# Patient Record
Sex: Male | Born: 1985 | Race: Black or African American | Hispanic: No | Marital: Single | Smoking: Never smoker
Health system: Southern US, Community
[De-identification: ages and names within clinical notes are randomized; demographics above are authoritative.]

## PROBLEM LIST (undated history)

## (undated) DIAGNOSIS — F209 Schizophrenia, unspecified: Secondary | ICD-10-CM

## (undated) DIAGNOSIS — Z21 Asymptomatic human immunodeficiency virus [HIV] infection status: Secondary | ICD-10-CM

## (undated) DIAGNOSIS — J45909 Unspecified asthma, uncomplicated: Secondary | ICD-10-CM

## (undated) DIAGNOSIS — B20 Human immunodeficiency virus [HIV] disease: Secondary | ICD-10-CM

---
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
## 2006-01-26 ENCOUNTER — Ambulatory Visit (HOSPITAL_COMMUNITY): Payer: Self-pay | Admitting: Psychiatry

## 2006-05-03 ENCOUNTER — Ambulatory Visit (HOSPITAL_COMMUNITY): Payer: Self-pay | Admitting: Psychiatry

## 2006-07-25 ENCOUNTER — Ambulatory Visit (HOSPITAL_COMMUNITY): Payer: Self-pay | Admitting: Psychiatry

## 2006-12-04 ENCOUNTER — Ambulatory Visit (HOSPITAL_COMMUNITY): Payer: Self-pay | Admitting: Psychiatry

## 2007-02-05 ENCOUNTER — Ambulatory Visit (HOSPITAL_COMMUNITY): Payer: Self-pay | Admitting: Psychiatry

## 2007-05-07 ENCOUNTER — Ambulatory Visit (HOSPITAL_COMMUNITY): Payer: Self-pay | Admitting: Psychiatry

## 2007-07-12 ENCOUNTER — Ambulatory Visit (HOSPITAL_COMMUNITY): Payer: Self-pay | Admitting: Psychiatry

## 2007-10-12 ENCOUNTER — Ambulatory Visit (HOSPITAL_COMMUNITY): Payer: Self-pay | Admitting: Psychiatry

## 2008-01-11 ENCOUNTER — Ambulatory Visit (HOSPITAL_COMMUNITY): Payer: Self-pay | Admitting: Psychiatry

## 2008-06-09 ENCOUNTER — Ambulatory Visit (HOSPITAL_COMMUNITY): Payer: Self-pay | Admitting: Psychiatry

## 2008-08-15 ENCOUNTER — Ambulatory Visit (HOSPITAL_COMMUNITY): Payer: Self-pay | Admitting: Psychiatry

## 2008-12-03 ENCOUNTER — Ambulatory Visit (HOSPITAL_COMMUNITY): Payer: Self-pay | Admitting: Psychiatry

## 2009-05-01 ENCOUNTER — Ambulatory Visit (HOSPITAL_COMMUNITY): Payer: Self-pay | Admitting: Psychiatry

## 2009-10-21 ENCOUNTER — Ambulatory Visit (HOSPITAL_COMMUNITY): Payer: Self-pay | Admitting: Psychiatry

## 2010-02-04 ENCOUNTER — Ambulatory Visit (HOSPITAL_COMMUNITY): Payer: Self-pay | Admitting: Psychiatry

## 2010-05-11 ENCOUNTER — Ambulatory Visit (HOSPITAL_COMMUNITY): Payer: Self-pay | Admitting: Psychiatry

## 2010-07-30 ENCOUNTER — Ambulatory Visit (HOSPITAL_COMMUNITY): Payer: Self-pay | Admitting: Psychiatry

## 2010-08-10 ENCOUNTER — Ambulatory Visit (HOSPITAL_COMMUNITY): Payer: Self-pay | Admitting: Psychiatry

## 2010-09-10 ENCOUNTER — Ambulatory Visit (HOSPITAL_COMMUNITY): Payer: Self-pay | Admitting: Psychiatry

## 2010-11-12 ENCOUNTER — Ambulatory Visit (HOSPITAL_COMMUNITY): Payer: Self-pay | Admitting: Psychiatry

## 2011-01-14 ENCOUNTER — Encounter (HOSPITAL_COMMUNITY): Payer: BC Managed Care – PPO | Admitting: Physician Assistant

## 2011-01-14 DIAGNOSIS — F259 Schizoaffective disorder, unspecified: Secondary | ICD-10-CM

## 2011-03-08 ENCOUNTER — Encounter (HOSPITAL_COMMUNITY): Payer: BC Managed Care – PPO | Admitting: Physician Assistant

## 2011-03-08 DIAGNOSIS — F259 Schizoaffective disorder, unspecified: Secondary | ICD-10-CM

## 2011-03-25 ENCOUNTER — Encounter (HOSPITAL_COMMUNITY): Payer: BC Managed Care – PPO | Admitting: Physician Assistant

## 2011-03-25 DIAGNOSIS — F259 Schizoaffective disorder, unspecified: Secondary | ICD-10-CM

## 2011-04-22 ENCOUNTER — Encounter (HOSPITAL_COMMUNITY): Payer: BC Managed Care – PPO | Admitting: Physician Assistant

## 2011-04-22 DIAGNOSIS — F259 Schizoaffective disorder, unspecified: Secondary | ICD-10-CM

## 2011-04-22 NOTE — Discharge Summary (Signed)
NAME:  Kenneth Welch, Kenneth Welch                    ACCOUNT NO.:  [id number]   MEDICAL RECORD NO.:  [id number]                   PATIENT TYPE:  INP   LOCATION:  0201                                 FACILITY:  [facility]   PHYSICIAN:  Cindie Crumbly, M.D.               DATE OF BIRTH:  [birth year]   DATE OF ADMISSION:  08/01/2003  DATE OF DISCHARGE:  08/05/2003                                 DISCHARGE SUMMARY   REASON FOR ADMISSION:  This 25 year old African-American male was admitted  with an exacerbation of schizoaffective disorder after being discharged from  the hospital on July 30, 2003.  On the day of admission, he became  agitated, responding to internal stimuli, was increasingly combative, and  was readmitted to the hospital because of dangerousness to self and others.  For further history of present illness, please see the patient's psychiatric  admission assessment.   PHYSICAL EXAMINATION:  Physical examination at the time of admission was  entirely unremarkable.   LABORATORY DATA:  His laboratory examination was performed during his  previous hospitalization and no additional laboratory workup was performed  during this hospitalization.  The patient received no x-rays, no special  procedures, no additional consultations.  He sustained no complications  during the course of this hospitalization.   HOSPITAL COURSE:  The patient was continued on Geodon and Zyprexa.  Abilify  was increased to 20 mg p.o. q.h.s.  Ativan was added for agitation and  anxiety.  He tolerated this medication well without side effects.  At the  time of discharge, he denies any homicidal or suicidal ideation, his affect  and mood have improved, he is actively participating in all aspects of the  therapeutic treatment program, no longer appears to be danger to himself or  others.  His thoughts are less disorganized.  He is no longer showing any  significant delusional symptoms and is no longer responding to  auditory  hallucinations.  He is able to perform all of his activities of daily living  and consequently is felt to have reached his maximum benefits of  hospitalization and is ready for discharge to a less restrictive alternative  setting.   CONDITION ON DISCHARGE:  His condition on discharge is improved.   DIAGNOSES:  .  Diagnoses according to DSM-IV:   AXIS I:  1. Schizophreniform disorder.  2. Rule out schizoaffective disorder.   AXIS II:  1. Rule out learning disorder, not otherwise specified.  2. Rule out personality disorder, not otherwise specified.   AXIS III:  None.   AXIS IV:  Severe.   AXIS V:  20 on admission, 30 on discharge.    FURTHER EVALUATION AND TREATMENT RECOMMENDATIONS:  1. The patient is discharged to home.  2. He is discharged on an unrestricted level of activity and a regular diet.  3. He will follow up with outpatient psychiatrist, Nawar M. Alnaquib, M.D.,     at [facility]  for all further aspects of his     psychiatric care and consequently, I will sign off on the case at this     time.  4. He will follow up with his primary care physician for all further aspects     of his medical care.   DISCHARGE MEDICATIONS:  1. Lorazepam 1 mg p.o. t.i.d. p.r.n. for agitation.  2. Zyprexa 15 mg p.o. at 5 p.m. and q.h.s.  3. Geodon 160 mg p.o. q.h.s.  4. Abilify 20 mg p.o. q.h.s.  5. Cogentin 1 mg p.o. q.h.s.                                               Cindie Crumbly, M.D.    TS/MEDQ  D:  08/05/2003  T:  08/05/2003  Job:  [id number]

## 2011-04-22 NOTE — H&P (Signed)
   NAME:  Kenneth Welch, BISE                    ACCOUNT NO.:  [id number]   MEDICAL RECORD NO.:  [id number]                   PATIENT TYPE:  INP   LOCATION:  0201                                 FACILITY:  [facility]   PHYSICIAN:  Nawar M. Alnaquib, M.D.             DATE OF BIRTH:  Dec 29, 1985   DATE OF ADMISSION:  07/25/2003  DATE OF DISCHARGE:                         PSYCHIATRIC ADMISSION ASSESSMENT   IDENTIFYING INFORMATION:  He is a 25 year old African-American male admitted  because of hearing voices and seeing eyes floating in front of him.  This  happened after he saw someone get killed on a video game that he was  playing.  There was a dead man that scared him.  He got so scared, he got  out of the house and started walking down the street.  At that time, a  friend of his saw him and called 911.  He had just started school and he had  feeling quite well until then, since he was initially discharged from [facility] the first week in August.  He has had a recurrence of  auditory and visual hallucinations.   JUSTIFICATION FOR 24-HOUR CARE:  Hallucinations and agitation.   PAST PSYCHIATRIC HISTORY:  One prior admission to [facility] July to August 2004.  At that time, he was discharged on Abilify,  Geodon and Zyprexa.   SUBSTANCE ABUSE HISTORY:  None.   PAST MEDICAL HISTORY:  None remarkable.   ALLERGIES:  None.   CURRENT MEDICATIONS:  Geodon 120 mg per day, Zyprexa 15-20 mg per day,  Abilify 15 mg at bedtime and Zoloft 100 mg q.d.   MENTAL STATUS EXAM:  The patient reports he is quite a bit depressed.  He  appears with a dull, flat affect.  He talks very much in a low voice.  Speech is, however, normal and intelligible and he appears very worried,  apprehensive and guarded.  He reports auditory and visual hallucinations.  He denies any obsessions, compulsions.  He denies any phobias.  He reports  good insight into his problems but his  judgment is somewhat poor.   STRENGTHS/ASSETS:  Good IQ.  Intelligent voiced and quite mature for his  age.   FAMILY HISTORY:  Not very sure. . .   Dictation ended at this point.                                               Nawar M. Alnaquib, M.D.    NMA/MEDQ  D:  07/26/2003  T:  07/27/2003  Job:  [id number]

## 2011-04-22 NOTE — Discharge Summary (Signed)
NAME:  Kenneth Welch, Kenneth Welch                    ACCOUNT NO.:  [id number]   MEDICAL RECORD NO.:  [id number]                   PATIENT TYPE:  INP   LOCATION:  0201                                 FACILITY:  [facility]   PHYSICIAN:  Cindie Crumbly, M.D.               DATE OF BIRTH:  Aug 11, 1986   DATE OF ADMISSION:  07/01/2003  DATE OF DISCHARGE:  07/15/2003                                 DISCHARGE SUMMARY   REASON FOR ADMISSION:  This 25 year old African-American male was admitted  complaining of a new onset of psychosis consistent with schizophreniform  disorder.  He had become increasingly delusional, assaultive and combative  and required inpatient stabilization.  For further history of present  illness, please see the patient's psychiatry admission assessment.   PHYSICAL EXAMINATION:  At the time of admission was significant only for a  history of pyloric stenosis as an infant.  He had an otherwise unremarkable  physical examination.   LABORATORY EXAMINATION:  The patient underwent a laboratory workup to rule  out any medical problems contributing to his symptomatology.  A urine probe  for gonorrhea and chlamydia were negative.  An RPR was nonreactive.  Urine  drug screen was negative.  A UA was unremarkable.  Basic metabolic panel  showed a CO2 of 16, glucose of 226, BUN of 30, creatinine of 1.9 and sodium  of 147 consistent with the patient having been dehydrated for a period of  several days and then rehydrated in the emergency room.  CBC showed a white  count of 12.8, hemoglobin of 16.6, hematocrit of 48.9 and was otherwise  unremarkable.  The patient received no x-rays, no special procedures, no  additional consultations.  He sustained no complications during the course  of this hospitalization.   HOSPITAL COURSE:  On admission, the patient was psychomotor agitated,  combative, hypervigilant, suspicious, guarded.  His concentration was  decreased.  He was responding to auditory  hallucinations.  Speech displayed  word salad.  He showed various stereotypic posturing.  His thoughts were  disorganized.  He was begun on a trial of Geodon, developed some mild  dystonia, and Cogentin was added to this regimen.  He only showed a partial  response to Geodon and to this Zyprexa was added.  Hallucinations continued  to improve.  His thoughts became more organized but did not completely  resolve his psychotic symptoms and Abilify was added to this regimen.  At  the time of discharge, he denies any homicidal or suicidal ideation.  His  affect and mood remain somewhat flattened and blunted.  He had had some  periods of anxiety to which Zoloft was added and he tolerated this well.  At  the time of discharge, he is actively participating in all aspects of the  therapeutic treatment program.  He remains somewhat isolative, withdrawn and  apathetic.  His hallucinations have considerably decreased and he denies any  hallucinations on the day of  discharge.  His delusional symptoms have  improved.   CONDITION ON DISCHARGE:  Improved.   DISCHARGE DIAGNOSES:   AXIS I:  Schizophreniform disorder.   AXIS II:  None.   AXIS III:  None.   AXIS IV:  Severe.   AXIS V:  Code 20 on admission, code 30 on discharge.   FURTHER EVALUATION AND TREATMENT RECOMMENDATIONS:  1. The patient is discharged to home.  2. He is discharged on an unrestricted level of activity and a regular diet.  3. He will follow up with his outpatient psychiatrist for all further     aspects of his psychiatric care and his primary care physician for all     further aspects of his medical care, and therefore I will sign off on the     case at this time.   DISCHARGE MEDICATIONS:  1. Abilify 15 mg p.o. q.h.s.  2. Zyprexa 15 mg at 5 p.m. and q.h.s.  3. Zoloft 50 mg p.o. daily.  4. Geodon 160 mg p.o. q.h.s.  5. Cogentin 1 mg p.o. q.h.s.                                                 Cindie Crumbly,  M.D.    TS/MEDQ  D:  07/15/2003  T:  07/15/2003  Job:  [id number]

## 2011-04-22 NOTE — H&P (Signed)
NAME:  Kenneth Welch, Kenneth Welch                    ACCOUNT NO.:  [id number]   MEDICAL RECORD NO.:  [id number]                   PATIENT TYPE:  INP   LOCATION:  0201                                 FACILITY:  [facility]   PHYSICIAN:  Cindie Crumbly, M.D.               DATE OF BIRTH:  05-Apr-1986   DATE OF ADMISSION:  08/01/2003  DATE OF DISCHARGE:                         PSYCHIATRIC ADMISSION ASSESSMENT   REASON FOR ADMISSION:  This 25 year old white male was admitted for an  exacerbation of schizoaffective disorder after being discharged from this  facility of July 30, 2003.  On the day of admission he became agitated,  responding to internal stimuli and was increasingly combative.  Mother felt  she could no longer contain him at home and brought him back to the hospital  for stabilization.   HISTORY OF PRESENT ILLNESS:  The patient has had 3 hospitalizations within  the past several weeks for a new onset of psychotic break.  The patient did  well for a period of several days following discharge, then on the day of  admission became agitated, responding to internal stimuli.  He stated that  his thoughts became increasingly disorganized and thought they were the  thoughts of the devil and he became increasingly combative to the point  where he could no longer be contained at home, thus requiring re-  hospitalization.   PAST PSYCHIATRIC HISTORY:  Significant for inpatient hospitalizations at  [facility] from July 27 to July 15, 2003, and  then again from August 21 to July 30, 2003.  For further past history  please see the patient's previous admission and discharge summaries.   DRUG AND ALCOHOL ABUSE HISTORY:  The patient denies any use of alcohol,  tobacco and street drugs.   PAST MEDICAL HISTORY:  He denies any medical or surgical problems.  He has  no known drug allergies or sensitivities.   CURRENT MEDICATIONS:  Geodon 160 mg p.o. q.h.s., Abilify 15 mg  p.o. q.h.s.,  Zyprexa 15 mg at 5 p.m. and 9 p.m., Cogentin 1 mg p.o. q.h.s.   STRENGTHS AND ASSETS:  His family is supportive of him.   FAMILY AND SOCIAL HISTORY:  Unchanged over previous.  Please see his  previous psychiatric admission and discharge summaries.   MENTAL STATUS EXAM:  The patient presents as a well-developed, well-  nourished adolescent male, who is alert, oriented x4, psychomotor agitated,  and whose appearance is compatible with his stated age.  His speech is  coherent with a decreased rate and volume of speech.  He displays occasional  looseness of associations and some disorganization of his speech.  He  displays poor impulse control, decreased concentration.  His thoughts are  disorganized.  He is responding to internal stimuli.  He displays bizarre  delusions.  His affect is flat, mood is blunted, depressed and anxious.  His  thought processes are disorganized.   ADMISSION DIAGNOSES:   AXIS  I:  1. Schizophreniform disorder.  2. Rule out schizoaffective disorder.   AXIS II:  1. Rule out learning disorder not otherwise specified.  2. Rule out personality disorder not otherwise specified.   AXIS III:  None.   AXIS IV:  Severe.   AXIS V:  Code 20.   FURTHER EVALUATION AND TREATMENT RECOMMENDATIONS:  1. Estimated length of stay for the patient on the inpatient unit is 5 to 7     days.  2. Initial discharge plan is to discharge the patient to home.  3. Initial plan of care is to continue the patient on Geodon, Zyprexa and     Cogentin and increase  the patient's Abilify and add Ativan on a p.r.n.     basis for agitation.  Psychotherapy will focus on improving the patient's     impulse control, decreasing cognitive distortions and improving his     reality testing..  A laboratory workup will also be initiated to rule out     any other medical problems contributing to his symptomatology.                                                 Cindie Crumbly,  M.D.    TS/MEDQ  D:  08/02/2003  T:  08/02/2003  Job:  [id number]

## 2011-04-22 NOTE — Discharge Summary (Signed)
NAME:  Kenneth Welch, Kenneth Welch                    ACCOUNT NO.:  [id number]   MEDICAL RECORD NO.:  [id number]                   PATIENT TYPE:  INP   LOCATION:  0201                                 FACILITY:  [facility]   PHYSICIAN:  Cindie Crumbly, M.D.               DATE OF BIRTH:  01/30/1986   DATE OF ADMISSION:  07/25/2003  DATE OF DISCHARGE:  07/30/2003                                 DISCHARGE SUMMARY   REASON FOR ADMISSION:  This 25 year old African-American male was admitted  for an exacerbation of psychotic symptoms.  For further history of present  illness, please see the patient's psychiatric admission assessment.   PHYSICAL EXAMINATION:  His physical examination at the time of admission was  entirely unremarkable.   LABORATORY DATA:  The patient underwent a laboratory workup to rule out any  other medical problems contributing to his symptomatology.  Comprehensive  metabolic showed a SGOT 48, SGPT 141, and was otherwise unremarkable.  CBC  showed an MCV of 92.8, RDW 14.7%, and was otherwise unremarkable.  The  patient received no x-rays, no special procedures, no additional  consultations.  He sustained no complications during the course of this  hospitalization.   HOSPITAL COURSE:  On admission, the patient was psychomotor agitated,  responding to internal stimuli.  He was restarted on his outpatient  medications including Zyprexa, Abilify, Geodon, and Cogentin.  He tolerated  these medications well without side effects.  At the time of discharge, he  denies any homicidal or suicidal ideation.  He reports that his auditory  hallucinations have been resolving.  He denies any plans to harm himself or  others.  He is actively participating in all aspects of the therapeutic  treatment program and is motivated for outpatient therapy.  Consequently, it  is felt he has reached his maximum benefits of hospitalization and is ready  for discharge to a less restrictive alternative  setting.   CONDITION ON DISCHARGE:  His condition on discharge is improved.   DIAGNOSES:  Diagnoses according to DSM-IV:   AXIS I:  1. Schizophreniform disorder.  2. Rule out schizoaffective disorder.   AXIS II:  Rule out personality disorder, not otherwise specified.   AXIS III:  None.   AXIS IV:  Severe.   AXIS V:  20 on admission, 30 on discharge.   FURTHER EVALUATION AND TREATMENT RECOMMENDATIONS:  1. The patient is discharged to home.  2. He is discharged on an unrestricted level of activity and a regular diet.  3. He will follow up with his outpatient psychiatrist, Nawar M. Alnaquib,     M.D., and with Abel Presto, his outpatient psychiatrist at the [facility].  4. He will follow up with his primary care physician for all further aspects     of his medical care and consequently, I will sign off on the case at this     time.   DISCHARGE  MEDICATIONS:  1. Zyprexa 15 mg p.o. q.p.m. and 15 mg q.h.s.  2. Abilify 15 mg p.o. q.h.s.  3. Geodon 160 mg p.o. q.h.s. with food.  4. Cogentin 1 mg p.o. q.h.s.                                                Cindie Crumbly, M.D.    TS/MEDQ  D:  07/30/2003  T:  07/30/2003  Job:  [id number]

## 2011-04-22 NOTE — H&P (Signed)
NAME:  Kenneth Welch, Kenneth Welch                    ACCOUNT NO.:  [id number]   MEDICAL RECORD NO.:  [id number]                   PATIENT TYPE:  INP   LOCATION:  0201                                 FACILITY:  [facility]   PHYSICIAN:  Cindie Crumbly, M.D.               DATE OF BIRTH:  08-Sep-1986   DATE OF ADMISSION:  07/01/2003  DATE OF DISCHARGE:                         PSYCHIATRIC ADMISSION ASSESSMENT   REASON FOR ADMISSION:  This 25 year old African-American male was admitted  complaining of the new onset of a psychotic break.   HISTORY OF PRESENT ILLNESS:  The patient's parents report that he has been  increasingly isolative and withdrawn, responding to internal stimuli over  the past several weeks.  He has been increasingly apathetic and has not been  eating or drinking for the past two to three days.  He has not slept for the  past three days.  He has been increasingly agitated, combative, and  frightened.  He was taken to the emergency department where he underwent a  medical workup.  This was negative, including a CT scan of the brain.  The  patient has no past history of psychiatric illness.  He has no history of  alcohol, tobacco, or street drug use.   PAST MEDICAL HISTORY:  Negative.   PAST SURGICAL HISTORY:  Significant for correction of pyloric stenosis at  age [age] months.  He has no known drug allergies or sensitivities.  He is  taking no medication.  His strengths and assets are that his family is  supportive of him.   FAMILY AND SOCIAL HISTORY:  The patient lives with his mother, father and  sister.  He is a rising Medical sales representative.  There is no family history of  psychiatric illness.   MENTAL STATUS EXAM:  The patient presents as a well-developed, well-  nourished, African-American male who is alert and oriented x4, psychomotor  agitated, combative, and whose appearance is compatible with stated age.  He  displays stereotypic rigid posturing where he will hold himself in a  position with his head and back curled backwards.  He is hypervigilant,  suspicious, guarded, with decreased concentration.  His speech displays word  salad.  His thoughts are profoundly disorganized.  He appears to be somewhat  paranoid and fearing that people are out to harm him.  He is unable to do  similarities, differences, or proverbs.  Memory is not able to be tested at  this time.  He is oriented to person, but does not appear to comprehend his  situation, time, or his physical location.   DIAGNOSES ACCORDING TO DSM-IV:   AXIS I:  1. Schizophreniform disorder.  2. Rule out bipolar disorder.   AXIS II:  1. Rule out learning disorder, not otherwise specified.  2. Rule out personality disorder, not otherwise specified.   AXIS III:  None.   AXIS IV:  Severe.   AXIS V:  20.   FURTHER EVALUATION AND  TREATMENT RECOMMENDATIONS:  The estimated length of  stay for the patient on the inpatient unit is five to seven days.  Initial  discharge plan is to discharge the patient home.  Initial plan of care is to  begin the patient on a trial of Geodon with Zyprexa to attempt to control  his psychotic symptomatology.  I discussed with the patient and his parents  and sister the risks, benefits, side effects, and alternative of using no  medication.  They appeared to understand and agreed to the above trial of  medication.  Psychotherapy will focus on improving the patient's reality  testing, impulse control, and decreasing potential for harm to self and  others.  Laboratory workup will also be initiated to rule out any other  medical problems contributing to his symptomatology.                                                Cindie Crumbly, M.D.    TS/MEDQ  D:  07/01/2003  T:  07/01/2003  Job:  [id number]

## 2011-04-22 NOTE — H&P (Signed)
   NAME:  Kenneth Welch, Kenneth Welch                    ACCOUNT NO.:  [id number]   MEDICAL RECORD NO.:  [id number]                   PATIENT TYPE:  INP   LOCATION:  0201                                 FACILITY:  [facility]   PHYSICIAN:  Nawar M. Alnaquib, M.D.             DATE OF BIRTH:  August 03, 1986   DATE OF ADMISSION:  07/25/2003  DATE OF DISCHARGE:                         PSYCHIATRIC ADMISSION ASSESSMENT   CONTINUATION.   SOCIAL HISTORY:  He lives with his mother, his dad and his sister in  [place].  He attends school, [employer], in 10th grade.   ADMISSION DIAGNOSES:   AXIS I:  Paranoid schizophrenia.   AXIS II:  Deferred.   AXIS III:  None.   AXIS IV:  Psychosocial stressors.   AXIS V:  35 Global Assessment of Functioning.   ESTIMATED LENGTH OF STAY:  One week.   INITIAL DISCHARGE PLAN:  To home.   INITIAL PLAN OF CARE:  Increase Abilify 20 mg at bedtime.  Cogentin added 1  mg p.r.n. and/or EPS.  In addition to that, I will start him on Klonopin 1  mg b.i.d. and Zyprexa 7.5 mg at bedtime (so the dose has been reduced).                                               Nawar M. Alnaquib, M.D.    NMA/MEDQ  D:  07/26/2003  T:  07/27/2003  Job:  [id number]

## 2011-06-02 ENCOUNTER — Encounter (HOSPITAL_COMMUNITY): Payer: BC Managed Care – PPO | Admitting: Physician Assistant

## 2011-06-02 DIAGNOSIS — F259 Schizoaffective disorder, unspecified: Secondary | ICD-10-CM

## 2011-06-30 ENCOUNTER — Encounter (HOSPITAL_COMMUNITY): Payer: BC Managed Care – PPO | Admitting: Physician Assistant

## 2011-06-30 DIAGNOSIS — F29 Unspecified psychosis not due to a substance or known physiological condition: Secondary | ICD-10-CM

## 2011-08-02 ENCOUNTER — Encounter (HOSPITAL_COMMUNITY): Payer: BC Managed Care – PPO | Admitting: Physician Assistant

## 2011-09-07 ENCOUNTER — Encounter (INDEPENDENT_AMBULATORY_CARE_PROVIDER_SITE_OTHER): Payer: BC Managed Care – PPO | Admitting: Physician Assistant

## 2011-09-07 DIAGNOSIS — F29 Unspecified psychosis not due to a substance or known physiological condition: Secondary | ICD-10-CM

## 2011-10-13 ENCOUNTER — Other Ambulatory Visit (HOSPITAL_COMMUNITY): Payer: Self-pay | Admitting: Physician Assistant

## 2011-10-13 DIAGNOSIS — F29 Unspecified psychosis not due to a substance or known physiological condition: Secondary | ICD-10-CM

## 2011-10-13 MED ORDER — ARIPIPRAZOLE 10 MG PO TABS: 10.0000 mg | ORAL_TABLET | Freq: Every day | ORAL | Status: DC

## 2011-10-25 ENCOUNTER — Other Ambulatory Visit (HOSPITAL_COMMUNITY): Payer: Self-pay

## 2011-10-26 ENCOUNTER — Ambulatory Visit (INDEPENDENT_AMBULATORY_CARE_PROVIDER_SITE_OTHER): Payer: BC Managed Care – PPO | Admitting: Physician Assistant

## 2011-10-26 DIAGNOSIS — F259 Schizoaffective disorder, unspecified: Secondary | ICD-10-CM

## 2011-10-26 NOTE — Progress Notes (Signed)
   [facility] Follow-up Outpatient Visit  Kenneth Welch 09-Aug-1986  Date: 10/26/11    Subjective: Pt reports he has been denied three times for disability.  Denies any problems of mood or psychosis, other than disorganized thinking.  There were no vitals filed for this visit.  Mental Status Examination  Appearance: Neat dress and well groomed Alert: Yes Attention: good  Cooperative: Yes Eye Contact: Good Speech: Quiet and clear Psychomotor Activity: Decreased Memory/Concentration: Intact Oriented: person, place, time/date and situation Mood: Depressed Affect: Blunted Thought Processes and Associations: Logical Fund of Knowledge: Fair Thought Content:  Insight: Fair Judgement: Fair  Diagnosis: Schizoaffective disorder  Treatment Plan: Continue meds as prescribed.  Follow up in 2 months.  Suggested seeking legal assistance if pt wants to pursue disability claim.  Alvena Kiernan, PA

## 2012-01-04 ENCOUNTER — Ambulatory Visit (HOSPITAL_COMMUNITY): Payer: BC Managed Care – PPO | Admitting: Physician Assistant

## 2012-01-10 ENCOUNTER — Ambulatory Visit (INDEPENDENT_AMBULATORY_CARE_PROVIDER_SITE_OTHER): Payer: BC Managed Care – PPO | Admitting: Physician Assistant

## 2012-01-10 DIAGNOSIS — F259 Schizoaffective disorder, unspecified: Secondary | ICD-10-CM

## 2012-01-11 NOTE — Progress Notes (Signed)
   [facility] Follow-up Outpatient Visit  Kenneth Welch December 02, 1986  Date: 01/10/12   Subjective: Choya presents today to follow up on his medications for schizoaffective disorder. He happily reports that he is now working as a Engineer, technical sales at [employer], assisting students with introduction to computers. He is working 3 days a week for approximately 4 hours a day. He is also taking classes at [employer] in Proofreader and data and Futures trader. The last 2 classes are online. 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 He reports that his sleep and appetite are okay. He denies any auditory or visual hallucinations. He denies any paranoia, or any suicidal or homicidal  ideation.  There were no vitals filed for this visit.  Mental Status Examination  Appearance: Well groomed and casually dressed Alert: Yes Attention: good  Cooperative: Yes Eye Contact: Fair Speech: Soft and minimal, but clear Psychomotor Activity: Psychomotor Retardation Memory/Concentration: Intact Oriented: person, place, time/date and situation Mood: Dysphoric Affect: Blunt Thought Processes and Associations: Coherent and Goal Directed Fund of Knowledge: Good Thought Content:  Insight: Good Judgement: Good  Diagnosis: Schizoaffective disorder  Treatment Plan: We will continue all of his medications as currently prescribed and followup in one month  Goldie Tregoning, PA

## 2012-02-01 ENCOUNTER — Other Ambulatory Visit (HOSPITAL_COMMUNITY): Payer: Self-pay | Admitting: Physician Assistant

## 2012-02-08 ENCOUNTER — Ambulatory Visit (INDEPENDENT_AMBULATORY_CARE_PROVIDER_SITE_OTHER): Payer: BC Managed Care – PPO | Admitting: Physician Assistant

## 2012-02-08 DIAGNOSIS — F259 Schizoaffective disorder, unspecified: Secondary | ICD-10-CM

## 2012-02-08 DIAGNOSIS — F200 Paranoid schizophrenia: Secondary | ICD-10-CM

## 2012-02-08 NOTE — Progress Notes (Signed)
   [facility] Behavioral [facility] Follow-up Outpatient Visit  Kenneth Welch February 19, 1986  Date: 02/08/12   Subjective: Kenneth Welch presents today to followup on his medications prescribed for schizoaffective disorder. He reports that he continues to do well. 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 He reports that he is not doing anything to fill his spare time that he has during this break. He denies any auditory or visual hallucinations, but he continues to endorse mild paranoia, in that, at times he feels people think he is following them. He denies any suicidal or homicidal ideation. He reports that he is sleeping well, and his appetite is good.  There were no vitals filed for this visit.  Mental Status Examination  Appearance: Well groomed and dressed Alert: Yes Attention: good  Cooperative: Yes Eye Contact: Good Speech: Clear and even Psychomotor Activity: Normal Memory/Concentration: Intact Oriented: person, place, time/date and situation Mood: Anxious Affect: Flat Thought Processes and Associations: Logical Fund of Knowledge: Fair Thought Content: Paranoia Insight: Fair Judgement: Good  Diagnosis: Schizoaffective disorder  Treatment Plan: We will continue his Abilify 10 mg daily, Cogentin 1 mg twice daily, and Zyprexa 5 mg at bedtime. He will followup in 2 months.  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  Besan Ketchem, PA

## 2012-04-10 ENCOUNTER — Ambulatory Visit (INDEPENDENT_AMBULATORY_CARE_PROVIDER_SITE_OTHER): Payer: BC Managed Care – PPO | Admitting: Physician Assistant

## 2012-04-10 DIAGNOSIS — F259 Schizoaffective disorder, unspecified: Secondary | ICD-10-CM | POA: Insufficient documentation

## 2012-04-10 NOTE — Progress Notes (Signed)
   [facility] Behavioral Health Follow-up Outpatient Visit  Kenneth Welch 1986-09-02  Date: 04/10/2012   Subjective: Kenneth Welch presents today to followup on medications prescribed for schizoaffective disorder. 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 He reports that he ran out of the Abilify and has not had any in 2 or 3 days. He states that his mood is stable, but he is experiencing some racing thoughts. He states that his sleep and appetite are good. He denies any suicidal or homicidal ideation. He denies any auditory or visual hallucinations. When asked why he didn't refill his Abilify he stated he didn't have the money.  There were no vitals filed for this visit.  Mental Status Examination  Appearance: Well groomed and neatly dressed Alert: Yes Attention: good  Cooperative: Yes Eye Contact: Good Speech: Clear and coherent Psychomotor Activity: Psychomotor Retardation Memory/Concentration: Intact Oriented: person, place, time/date and situation Mood: Dysphoric Affect: Flat Thought Processes and Associations: Goal Directed and Logical Fund of Knowledge: Good Thought Content: Normal Insight: Fair Judgement: Good  Diagnosis: Schizoaffective disorder  Treatment Plan: We will continue his Abilify 10 mg daily, Cogentin 1 mg twice daily, and Zyprexa 5 mg at bedtime. He was given a coupon for financial assistance with Abilify. He will return for followup in 2 months.  Kilian Schwartz, PA-C

## 2012-05-15 ENCOUNTER — Ambulatory Visit (INDEPENDENT_AMBULATORY_CARE_PROVIDER_SITE_OTHER): Payer: BC Managed Care – PPO | Admitting: Physician Assistant

## 2012-05-15 DIAGNOSIS — F259 Schizoaffective disorder, unspecified: Secondary | ICD-10-CM

## 2012-05-15 NOTE — Progress Notes (Signed)
   [facility] Behavioral Health Follow-up Outpatient Visit  Kenneth Welch 1986/03/08  Date: 05/15/2012   Subjective: [name] today to followup on medications prescribed for his schizoaffective disorder. He reports that overall he is doing well. He denies any paranoid ideation. He denies any auditory or visual hallucinations. He denies any suicidal or homicidal ideation. He states that his mood is "pretty good." He reports that he is sleeping good once his mind settles down. He denies any extrapyramidal symptoms. He asked if any of his medications could be making it difficult for him to communicate with other people, and reports that one night he missed his dose of Zyprexa and thought that he felt better. 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  There were no vitals filed for this visit.  Mental Status Examination  Appearance: Well groomed and neatly dressed Alert: Yes Attention: good  Cooperative: Yes Eye Contact: Good Speech: Quiet and minimal Psychomotor Activity: Decreased Memory/Concentration: Intact Oriented: person, place, time/date and situation Mood: Dysphoric Affect: Flat Thought Processes and Associations: Logical Fund of Knowledge: Fair Thought Content: Normal Insight: Fair Judgement: Fair  Diagnosis: Schizoaffective disorder  Treatment Plan: We will reduce the Zyprexa dose to 2-1/2 mg by dividing tablets, and he will return for followup in 6 weeks. He has been instructed to monitor his paranoia and racing thoughts and if they increase he is to raise the Zyprexa dose back to 5 mg at bedtime. We will continue his Abilify at 10 mg daily and Cogentin at 1 mg twice daily.  Nelwyn Hebdon, PA-C

## 2012-05-22 ENCOUNTER — Encounter (HOSPITAL_COMMUNITY): Payer: Self-pay | Admitting: *Deleted

## 2012-06-03 ENCOUNTER — Other Ambulatory Visit (HOSPITAL_COMMUNITY): Payer: Self-pay | Admitting: Physician Assistant

## 2012-06-12 ENCOUNTER — Ambulatory Visit (HOSPITAL_COMMUNITY): Payer: Self-pay | Admitting: Physician Assistant

## 2012-06-20 ENCOUNTER — Other Ambulatory Visit (HOSPITAL_COMMUNITY): Payer: Self-pay | Admitting: Physician Assistant

## 2012-06-28 ENCOUNTER — Ambulatory Visit (INDEPENDENT_AMBULATORY_CARE_PROVIDER_SITE_OTHER): Payer: BC Managed Care – PPO | Admitting: Physician Assistant

## 2012-06-28 DIAGNOSIS — F259 Schizoaffective disorder, unspecified: Secondary | ICD-10-CM

## 2012-06-28 NOTE — Progress Notes (Signed)
   [facility] Behavioral Health Follow-up Outpatient Visit  AMAD MAU January 24, 1986  Date: 06/28/2012   Subjective: Laquentin presents today to followup on his treatment for schizoaffective disorder. At his last visit we decreased his Zyprexa dose by half, to 2.5 mg each evening. He reports that he he now feels more confident, he no longer stutters, and he is better able to express himself. He does report having some trouble falling asleep, and states that it takes him 1-2 hours. He normally falls asleep around midnight, but then he sleeps until 10 AM. He states that his mood is "pretty good." He wishes he was working during the summer, but he will be going back to work in approximately 3 weeks. 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 He denies any increase in paranoid thoughts, auditory or visual hallucinations, or suicidal or homicidal ideation.  There were no vitals filed for this visit.  Mental Status Examination  Appearance: Well groomed and neatly dressed Alert: Yes Attention: good  Cooperative: Yes Eye Contact: Good Speech: Monotone Psychomotor Activity: Decreased Memory/Concentration: Intact Oriented: person, place, time/date and situation Mood: Dysphoric Affect: Inappropriate Thought Processes and Associations: Linear Fund of Knowledge: Good Thought Content: Normal Insight: Fair Judgement: Good  Diagnosis: Schizoaffective disorder  Treatment Plan: We will continue Zyprexa at 2.5 mg each evening, Abilify 10 mg each morning, and Cogentin 1 mg twice daily. He will return for followup in 2 months.  Macee Venables, PA-C

## 2012-07-31 ENCOUNTER — Ambulatory Visit (INDEPENDENT_AMBULATORY_CARE_PROVIDER_SITE_OTHER): Payer: BC Managed Care – PPO | Admitting: Physician Assistant

## 2012-07-31 DIAGNOSIS — F259 Schizoaffective disorder, unspecified: Secondary | ICD-10-CM

## 2012-07-31 NOTE — Progress Notes (Signed)
   [facility] Behavioral Health Follow-up Outpatient Visit  Kenneth Welch 08/12/1986  Date: 07/31/2012   Subjective: Kenneth Welch presents today to followup on history Ment for schizoaffective disorder. He reports that he is still doing well on the reduced dose of Zyprexa. He states that he is sleeping well and his appetite is good. He denies any suicidal or homicidal ideation. He denies any auditory or visual hallucinations. He does endorse some mild paranoia, which is his baseline. He has some concerns that he may not be oh to get his medications when he makes the change from [facility] to [facility].  There were no vitals filed for this visit.  Mental Status Examination  Appearance: Well groomed and casually dressed Alert: Yes Attention: good  Cooperative: Yes Eye Contact: Good Speech: Clear and coherent Psychomotor Activity: Psychomotor Retardation Memory/Concentration: Intact Oriented: person, place, time/date and situation Mood: Dysphoric Affect: Flat Thought Processes and Associations: Circumstantial and Logical Fund of Knowledge: Good Thought Content: Paranoia Insight: Fair Judgement: Good  Diagnosis: Schizoaffective disorder  Treatment Plan: We will continue his Zyprexa at 2.5 mg at bedtime, Abilify 10 mg daily, and Cogentin 1 mg twice daily. He will return for followup in 2 months.  Jabarie Pop, PA-C

## 2012-08-21 ENCOUNTER — Ambulatory Visit (HOSPITAL_COMMUNITY): Payer: Self-pay | Admitting: Physician Assistant

## 2012-08-29 ENCOUNTER — Ambulatory Visit (INDEPENDENT_AMBULATORY_CARE_PROVIDER_SITE_OTHER): Payer: BC Managed Care – PPO | Admitting: Physician Assistant

## 2012-08-29 ENCOUNTER — Ambulatory Visit (HOSPITAL_COMMUNITY): Payer: Self-pay | Admitting: Physician Assistant

## 2012-08-29 DIAGNOSIS — F259 Schizoaffective disorder, unspecified: Secondary | ICD-10-CM

## 2012-08-29 NOTE — Progress Notes (Signed)
   [facility] Behavioral Health Follow-up Outpatient Visit  KEILYN NADAL Jan 29, 1986  Date: 08/29/2012   Subjective: Kenneth Welch presents today to followup on his treatment for schizoaffective disorder. He reports that he is trying to get all his financial dealings straightened out since he is switching from [employer] to [facility]. He reports that his mood is good. He is sleeping and eating well. He denies any paranoia he denies any auditory or visual hallucinations. He denies any suicidal or homicidal ideation. He is not doing much with his time, but stays at home and watches television.  There were no vitals filed for this visit.  Mental Status Examination  Appearance: Well groomed and neatly dressed Alert: Yes Attention: good  Cooperative: Yes Eye Contact: Good Speech: Clear and coherent, albeit soft and quiet Psychomotor Activity: Psychomotor Retardation Memory/Concentration: Intact Oriented: person, place, time/date and situation Mood: Dysphoric Affect: Flat Thought Processes and Associations: Logical Fund of Knowledge: Good Thought Content: Normal Insight: Fair Judgement: Good  Diagnosis: Schizoaffective disorder  Treatment Plan: We will continue his Abilify 10 mg daily, Zyprexa 2.5 mg at bedtime, and Cogentin 1 mg twice daily. He was given information on the [employer] [facility] [employer] [facility] [place] so he can use his time more productively. He will return for followup in 2 months.  Shaylynne Lunt, PA-C

## 2012-10-04 ENCOUNTER — Ambulatory Visit (INDEPENDENT_AMBULATORY_CARE_PROVIDER_SITE_OTHER): Payer: BC Managed Care – PPO | Admitting: Physician Assistant

## 2012-10-04 VITALS — Ht 73.5 in | Wt 173.4 lb

## 2012-10-04 DIAGNOSIS — F259 Schizoaffective disorder, unspecified: Secondary | ICD-10-CM

## 2012-10-04 MED ORDER — OLANZAPINE 5 MG PO TABS: 5.0000 mg | ORAL_TABLET | Freq: Every day | ORAL | Status: DC

## 2012-10-04 MED ORDER — ARIPIPRAZOLE 10 MG PO TABS: 10.0000 mg | ORAL_TABLET | Freq: Every day | ORAL | Status: DC

## 2012-10-04 MED ORDER — BENZTROPINE MESYLATE 1 MG PO TABS: 1.0000 mg | ORAL_TABLET | Freq: Two times a day (BID) | ORAL | Status: DC

## 2012-10-04 NOTE — Progress Notes (Signed)
   [facility] Behavioral Health Follow-up Outpatient Visit  Kenneth Welch 1986/01/03  Date: 10/04/2012   Subjective: Kenneth Welch presents today to followup on history that for schizoaffective disorder. She reports that he has been "helping his mother around the house." He did not followup on going to the wellness Academy as was recommended at his last appointment 2 months ago. He also has not called regarding, to money he can return if he is on disability. He reports that he has been compliant with his medications. He complains that he has an approximate 2 half hours of sleep latency, do to being in active. He reports that his appetite has increased some and he feels like he may have gained some weight. He denies any paranoia. He denies any auditory or visual hallucinations. He denies any suicidal or homicidal ideation. He denies any extrapyramidal symptoms.  There were no vitals filed for this visit.  Mental Status Examination  Appearance: Well groomed and casually dressed, weight 173.4 pounds, height 73.5 inches Alert: Yes Attention: good  Cooperative: Yes Eye Contact: Good Speech: Clear and coherent Psychomotor Activity: Psychomotor Retardation Memory/Concentration: Intact Oriented: person, place, time/date and situation Mood: Dysphoric Affect: Flat Thought Processes and Associations: Logical Fund of Knowledge: Fair Thought Content: Normal Insight: Fair Judgement: Fair  Diagnosis: Schizoaffective disorder  Treatment Plan: We will continue his Abilify 10 mg daily, Zyprexa 5 mg at bedtime, and Cogentin 1 mg twice daily. He has been instructed to begin walking, with the goal of walking 30 minutes a day. He is also been instructed to contact the disability office regarding how much money he can make without losing his disability. He was given the information for the [employer] again. He'll return for followup in 2 months.  Aashka Salomone, PA-C

## 2012-10-30 ENCOUNTER — Ambulatory Visit (HOSPITAL_COMMUNITY): Payer: Self-pay | Admitting: Physician Assistant

## 2012-12-10 ENCOUNTER — Ambulatory Visit (HOSPITAL_COMMUNITY): Payer: Self-pay | Admitting: Physician Assistant

## 2013-01-03 ENCOUNTER — Ambulatory Visit (INDEPENDENT_AMBULATORY_CARE_PROVIDER_SITE_OTHER): Payer: Medicaid Other | Admitting: Physician Assistant

## 2013-01-03 DIAGNOSIS — F259 Schizoaffective disorder, unspecified: Secondary | ICD-10-CM

## 2013-01-03 NOTE — Progress Notes (Signed)
   [facility] Behavioral Health Follow-up Outpatient Visit  Kenneth Welch [birth year]  Date: 01/03/2013   Subjective: Kenneth Welch presents today to followup on his treatment for schizoaffective disorder. He reports that he increased his Zyprexa dose back to the full amount because he was beginning to "lose control of his thoughts." He reports that his paranoia increased. Since increasing the dose, his paranoia is back to its baseline. He reports that his mood has been good, and that he is happy most of the time. He endorses good sleep and appetite. He denies any suicidal or homicidal ideation. He denies any auditory or visual hallucinations. He reports that he is spending most of his time with his mother, who has cancer, and watching his sister's 2 children. His nieces are ages 1 year and 5 months. His mother's cancer began as liver cancer, but has metastasized to her lung.  There were no vitals filed for this visit.  Mental Status Examination  Appearance: Well groomed and casually dressed Alert: Yes Attention: good  Cooperative: Yes Eye Contact: Good Speech: Quiet and slow, clear and coherent Psychomotor Activity: Psychomotor Retardation Memory/Concentration: Intact Oriented: person, place, time/date and situation Mood: Dysphoric Affect: Flat Thought Processes and Associations: Linear Fund of Knowledge: Good Thought Content: Paranoia Insight: Good Judgement: Good  Diagnosis: Schizoaffective disorder  Treatment Plan: We will continue his Abilify 10 mg daily, Zyprexa 5 mg at bedtime, and Cogentin 1 mg twice daily. He will return in 2 months at his request.  Ariba Lehnen, PA-C

## 2013-01-31 ENCOUNTER — Other Ambulatory Visit (HOSPITAL_COMMUNITY): Payer: Self-pay | Admitting: Physician Assistant

## 2013-03-04 ENCOUNTER — Ambulatory Visit (HOSPITAL_COMMUNITY): Payer: Self-pay | Admitting: Physician Assistant

## 2013-04-23 ENCOUNTER — Ambulatory Visit (INDEPENDENT_AMBULATORY_CARE_PROVIDER_SITE_OTHER): Payer: Medicaid Other | Admitting: Physician Assistant

## 2013-04-23 VITALS — BP 135/82 | HR 98 | Ht 73.5 in | Wt 181.8 lb

## 2013-04-23 DIAGNOSIS — F259 Schizoaffective disorder, unspecified: Secondary | ICD-10-CM

## 2013-04-24 ENCOUNTER — Encounter (HOSPITAL_COMMUNITY): Payer: Self-pay | Admitting: Physician Assistant

## 2013-04-24 NOTE — Progress Notes (Signed)
   [facility] Behavioral Health Follow-up Outpatient Visit  Kenneth Welch December 06, 1985  Date: 04/23/2013   Subjective: Kenneth Welch presents today to followup on history that for schizoaffective disorder. He reports that he is doing well. He denies any depression or anxiety. He denies any auditory or visual hallucinations. He denies any paranoid delusions. He endorses a good appetite, but poor diet. He reports that he has sleep latency of about 45 minutes, but then he will stay asleep for entire 8 hours. He denies any suicidal or homicidal ideation. He denies any auditory or visual hallucinations.  He is looking for employment online. He is currently helping his mother around the house. His mother is currently receiving treatment for liver cancer that metastasized to her lungs. She has been undergoing chemotherapy and radiation.  Filed Vitals:   04/24/13 1332  BP: 135/82  Pulse: 98    Mental Status Examination  Appearance: Casual Alert: Yes Attention: good  Cooperative: Yes Eye Contact: Good Speech: Clear and coherent Psychomotor Activity: Psychomotor Retardation Memory/Concentration: Intact Oriented: person, place, time/date and situation Mood: Anxious and Dysphoric Affect: Flat Thought Processes and Associations: Logical Fund of Knowledge: Fair Thought Content: Normal Insight: Fair Judgement: Good  Diagnosis: Schizoaffective disorder  Treatment Plan: We will continue his Abilify 10 mg daily, Zyprexa 5 mg at bedtime, and Cogentin 1 mg twice daily. She will return for followup in 3 months. He is encouraged to call between appointments if concerns arise.  Dorathea Faerber, PA-C

## 2013-06-13 ENCOUNTER — Other Ambulatory Visit (HOSPITAL_COMMUNITY): Payer: Self-pay | Admitting: *Deleted

## 2013-06-13 DIAGNOSIS — F259 Schizoaffective disorder, unspecified: Secondary | ICD-10-CM

## 2013-06-13 MED ORDER — OLANZAPINE 5 MG PO TABS: ORAL_TABLET | ORAL | Status: DC

## 2013-07-17 ENCOUNTER — Ambulatory Visit (INDEPENDENT_AMBULATORY_CARE_PROVIDER_SITE_OTHER): Payer: Medicaid Other | Admitting: Physician Assistant

## 2013-07-17 VITALS — BP 129/81 | HR 92 | Ht 73.5 in | Wt 189.6 lb

## 2013-07-17 DIAGNOSIS — F259 Schizoaffective disorder, unspecified: Secondary | ICD-10-CM

## 2013-07-17 MED ORDER — TRAZODONE HCL 50 MG PO TABS: 50.0000 mg | ORAL_TABLET | Freq: Every day | ORAL | Status: DC

## 2013-07-18 ENCOUNTER — Encounter (HOSPITAL_COMMUNITY): Payer: Self-pay | Admitting: Physician Assistant

## 2013-07-18 NOTE — Progress Notes (Signed)
[facility] Behavioral Health [id number] Progress Note  Kenneth Welch [id number] 27 y.o.  07/17/2013 2:34 PM  Chief Complaint: Followup visit for schizoaffective disorder  History of Present Illness: Kenneth Welch presents today to followup on his treatment for schizoaffective disorder. He reports he is having a good summer. He states that his mood has been good. He denies any he has experienced any depression or anxiety. He does endorse some trouble falling asleep, and states that it takes him about 4 hours. He then sleeps for a full 8 hours. He denies any suicidal or homicidal ideation. He denies any auditory or visual hallucinations. He had does endorse some mild paranoid thoughts. He reports that since he is on disability, he can have his student loans good him to deferral permanently. He wants this provider's signature to jail what to do that. He understands that this means that he will not builder return to school without taking his loans out of deferral.  Suicidal Ideation: No Plan Formed: No Patient has means to carry out plan: No  Homicidal Ideation: No Plan Formed: No Patient has means to carry out plan: No  Review of Systems: Psychiatric: Agitation: No Hallucination: No Depressed Mood: No Insomnia: Yes Hypersomnia: No Altered Concentration: No Feels Worthless: No Grandiose Ideas: No Belief In Special Powers: No New/Increased Substance Abuse: No Compulsions: No  Neurologic: Headache: No Seizure: No Paresthesias: No  Past Medical Family, Social History: Noncontributory  Outpatient Encounter Prescriptions as of 07/17/2013  Medication Sig Dispense Refill  . ABILIFY 10 MG tablet TAKE 1 TABLET BY MOUTH EVERY DAY  30 tablet  5  . benztropine (COGENTIN) 1 MG tablet TAKE 1 TABLET BY MOUTH TWICE A DAY  60 tablet  5  . OLANZapine (ZYPREXA) 5 MG tablet TAKE 1 TABLET AT BEDTIME  30 tablet  1  . traZODone (DESYREL) 50 MG tablet Take 1 tablet (50 mg total) by mouth at bedtime.  30 tablet  1    No facility-administered encounter medications on file as of 07/17/2013.    Past Psychiatric History/Hospitalization(s): Anxiety: Yes Bipolar Disorder: No Depression: Yes Mania: No Psychosis: Yes Schizophrenia: Yes Personality Disorder: No Hospitalization for psychiatric illness: No History of Electroconvulsive Shock Therapy: No Prior Suicide Attempts: No  Physical Exam: Constitutional:  BP 129/81  Pulse 92  Ht 6' 1.5" (1.867 m)  Wt 189 lb 9.6 oz (86.002 kg)  BMI 24.67 kg/m2  General Appearance: alert, oriented, no acute distress, well nourished and casually dressed  Musculoskeletal: Strength & Muscle Tone: within normal limits Gait & Station: normal Patient leans: N/A  Psychiatric: Speech (describe rate, volume, coherence, spontaneity, and abnormalities if any): Soft and coherent at a regular rate and rhythm.  Thought Process (describe rate, content, abstract reasoning, and computation): Within normal limits  Associations: Intact  Thoughts: normal  Mental Status: Orientation: oriented to person, place, time/date and situation Mood & Affect: flat affect Attention Span & Concentration: Intact  Medical Decision Making (Choose Three): Established Problem, Stable/Improving (1), Review of Psycho-Social Stressors (1), New Problem, with no additional work-up planned (3), Review of Medication Regimen & Side Effects (2) and Review of New Medication or Change in Dosage (2)  Assessment: Axis I: Schizoaffective  Axis II: Defer  Axis III: Noncontributory  Axis IV: Mild to moderate  Axis V: 65   Plan: We will continue his Abilify 10 mg daily, and Zyprexa 5 mg at bedtime, as well as Cogentin 1 mg twice daily. We will start him on trazodone 50 mg at bedtime to  target his sleep latency. He will return for followup in 2 months, he is encouraged to call between appointments if concerns.  Kasondra Junod, PA-C 07/18/2013

## 2013-07-24 ENCOUNTER — Ambulatory Visit (HOSPITAL_COMMUNITY): Payer: Self-pay | Admitting: Physician Assistant

## 2013-08-13 ENCOUNTER — Other Ambulatory Visit (HOSPITAL_COMMUNITY): Payer: Self-pay | Admitting: Physician Assistant

## 2013-09-18 ENCOUNTER — Ambulatory Visit (INDEPENDENT_AMBULATORY_CARE_PROVIDER_SITE_OTHER): Payer: Medicaid Other | Admitting: Physician Assistant

## 2013-09-18 ENCOUNTER — Encounter (HOSPITAL_COMMUNITY): Payer: Self-pay | Admitting: Physician Assistant

## 2013-09-18 ENCOUNTER — Encounter (INDEPENDENT_AMBULATORY_CARE_PROVIDER_SITE_OTHER): Payer: Self-pay

## 2013-09-18 VITALS — BP 121/73 | HR 98 | Ht 73.5 in | Wt 186.0 lb

## 2013-09-18 DIAGNOSIS — F259 Schizoaffective disorder, unspecified: Secondary | ICD-10-CM

## 2013-09-18 NOTE — Progress Notes (Signed)
   [facility] Behavioral Health Follow-up Outpatient Visit  Kenneth Welch [birth year]  Date: 09/18/2013   Subjective: [name] presents today to followup on his treatment for schizoaffective disorder. He reports that his mood has been stable. He denies that he has had any insight type features including auditory or visual hallucinations or paranoid delusions. He states that his sleep and appetite have been good. He is spending his time helping his mother "with her condition." He continues to struggle as to whether to call the Social Security Administration to find out, she can work without losing any of his disability.  Filed Vitals:   09/18/13 1423  BP: 121/73  Pulse: 98    Mental Status Examination  Appearance: Casual Alert: Yes Attention: good  Cooperative: Yes Eye Contact: Good Speech: Clear and coherent Psychomotor Activity: Psychomotor Retardation Memory/Concentration: Intact Oriented: person, place, time/date and situation Mood: Dysphoric Affect: Flat Thought Processes and Associations: Logical Fund of Knowledge: Good Thought Content: Normal Insight: Fair Judgement: Good  Diagnosis: Schizoaffective disorder  Treatment Plan: We will continue his Abilify 10 mg daily, Zyprexa 5 mg at bedtime, and Cogentin 1 mg twice daily. He will return for followup in 2 months.  Maryna Yeagle, PA-C

## 2013-09-29 ENCOUNTER — Emergency Department (HOSPITAL_COMMUNITY)
Admission: EM | Admit: 2013-09-29 | Discharge: 2013-09-29 | Disposition: A | Discharge status: Home / Self Care | Payer: Medicaid Other | Source: Home / Self Care | Attending: Family Medicine | Admitting: Family Medicine

## 2013-09-29 ENCOUNTER — Encounter (HOSPITAL_COMMUNITY): Payer: Self-pay | Admitting: Emergency Medicine

## 2013-09-29 ENCOUNTER — Ambulatory Visit (HOSPITAL_COMMUNITY)
Admit: 2013-09-29 | Discharge: 2013-09-29 | Disposition: A | Discharge status: Home / Self Care | Payer: Medicaid Other | Attending: Internal Medicine | Admitting: Internal Medicine

## 2013-09-29 DIAGNOSIS — R059 Cough, unspecified: Secondary | ICD-10-CM

## 2013-09-29 DIAGNOSIS — R053 Chronic cough: Secondary | ICD-10-CM

## 2013-09-29 DIAGNOSIS — Z8505 Personal history of malignant neoplasm of liver: Secondary | ICD-10-CM | POA: Insufficient documentation

## 2013-09-29 DIAGNOSIS — R05 Cough: Secondary | ICD-10-CM | POA: Insufficient documentation

## 2013-09-29 MED ORDER — IPRATROPIUM-ALBUTEROL 0.5-2.5 (3) MG/3ML IN SOLN
3.0000 mL | Freq: Once | RESPIRATORY_TRACT | Status: AC
  Administered 2013-09-29: 3 mL via RESPIRATORY_TRACT

## 2013-09-29 MED ORDER — ALBUTEROL SULFATE HFA 108 (90 BASE) MCG/ACT IN AERS: 2.0000 | INHALATION_SPRAY | RESPIRATORY_TRACT | Status: DC | PRN

## 2013-09-29 MED ORDER — AEROCHAMBER PLUS FLO-VU LARGE MISC: 1.0000 | Freq: Once | Status: DC

## 2013-09-29 MED ORDER — IPRATROPIUM BROMIDE 0.02 % IN SOLN
RESPIRATORY_TRACT | Status: AC
  Filled 2013-09-29: qty 2.5

## 2013-09-29 MED ORDER — ALBUTEROL SULFATE (5 MG/ML) 0.5% IN NEBU
INHALATION_SOLUTION | RESPIRATORY_TRACT | Status: AC
  Filled 2013-09-29: qty 0.5

## 2013-09-29 MED ORDER — BENZONATATE 200 MG PO CAPS: 200.0000 mg | ORAL_CAPSULE | Freq: Three times a day (TID) | ORAL | Status: DC | PRN

## 2013-09-29 NOTE — ED Notes (Signed)
C/o non productive cough for three months now.  Cough syrup was used but no relief.

## 2013-09-29 NOTE — ED Provider Notes (Signed)
CSN: [id number]     Arrival date & time 09/29/13  1150 History   First MD Initiated Contact with Patient 09/29/13 1357     Chief Complaint  Patient presents with  . Cough   (Consider location/radiation/quality/duration/timing/severity/associated sxs/prior Treatment) HPI Comments: 54m presents c/o dry cough for 3 months.  For 3 months, he has a constant, hacking, dry cough that he feels clinically a tickle in his chest. It occasionally will stop for up to about one week at the time but then recurs. He has never experienced anything like this before. He has a history of schizophrenia well controlled on multiple antipsychotics, no new medicines started at or around the time that this cough began. Denies fever, chills, chest pain, chest tightness, shortness of breath. Denies hemoptysis  Patient is a 27 y.o. male presenting with cough.  Cough Associated symptoms: no chest pain, no chills, no fever, no myalgias, no rash, no shortness of breath and no sore throat     History reviewed. No pertinent past medical history. History reviewed. No pertinent past surgical history. History reviewed. No pertinent family history. History  Substance Use Topics  . Smoking status: Never Smoker   . Smokeless tobacco: Not on file  . Alcohol Use: No    Review of Systems  Constitutional: Negative for fever, chills and fatigue.  HENT: Negative for congestion, postnasal drip and sore throat.   Eyes: Negative for visual disturbance.  Respiratory: Positive for cough. Negative for chest tightness and shortness of breath.   Cardiovascular: Negative for chest pain, palpitations and leg swelling.  Gastrointestinal: Negative for nausea, vomiting, abdominal pain, diarrhea and constipation.  Genitourinary: Negative for dysuria, urgency, frequency and hematuria.  Musculoskeletal: Negative for arthralgias, myalgias, neck pain and neck stiffness.  Skin: Negative for rash.  Neurological: Negative for dizziness, weakness  and light-headedness.    Allergies  Review of patient's allergies indicates no known allergies.  Home Medications   Current Outpatient Rx  Name  Route  Sig  Dispense  Refill  . ABILIFY 10 MG tablet      TAKE 1 TABLET BY MOUTH EVERY DAY   30 tablet   5   . albuterol (PROVENTIL HFA;VENTOLIN HFA) 108 (90 BASE) MCG/ACT inhaler   Inhalation   Inhale 2 puffs into the lungs every 4 (four) hours as needed (for cough).   1 Inhaler   1     Please instruct in proper usage   . benzonatate (TESSALON) 200 MG capsule   Oral   Take 1 capsule (200 mg total) by mouth 3 (three) times daily as needed for cough.   30 capsule   1   . benztropine (COGENTIN) 1 MG tablet      TAKE 1 TABLET BY MOUTH TWICE A DAY   60 tablet   5   . OLANZapine (ZYPREXA) 5 MG tablet      TAKE 1 TABLET AT BEDTIME   30 tablet   1   . Spacer/Aero-Holding Chambers (AEROCHAMBER PLUS FLO-VU LARGE) MISC   Other   1 each by Other route once.   1 each   0   . traZODone (DESYREL) 50 MG tablet   Oral   Take 1 tablet (50 mg total) by mouth at bedtime.   30 tablet   1    BP 149/92  Pulse 77  Temp(Src) 98.2 F (36.8 C) (Oral)  Resp 18  SpO2 97% Physical Exam  Nursing note and vitals reviewed. Constitutional: He is oriented to person, place,  and time. He appears well-developed and well-nourished. No distress.  HENT:  Head: Normocephalic and atraumatic.  Right Ear: External ear normal.  Left Ear: External ear normal.  Nose: Nose normal. Right sinus exhibits no maxillary sinus tenderness and no frontal sinus tenderness. Left sinus exhibits no maxillary sinus tenderness and no frontal sinus tenderness.  Mouth/Throat: Uvula is midline, oropharynx is clear and moist and mucous membranes are normal. No oropharyngeal exudate.  Eyes: EOM are normal. Right eye exhibits no discharge. Left eye exhibits no discharge.  Neck: Normal range of motion. Neck supple. No JVD present. No tracheal deviation present.   Cardiovascular: Normal rate, regular rhythm and normal heart sounds.  Exam reveals no gallop and no friction rub.   No murmur heard. Pulmonary/Chest: Effort normal and breath sounds normal. No respiratory distress.  Lymphadenopathy:    He has no cervical adenopathy.  Neurological: He is alert and oriented to person, place, and time. Coordination normal.  Skin: Skin is warm and dry. No rash noted. He is not diaphoretic.  Psychiatric: He has a normal mood and affect. Judgment normal.    ED Course  Procedures (including critical care time) Labs Review Labs Reviewed - No data to display Imaging Review Dg Chest 2 View  09/29/2013   CLINICAL DATA:  Dry cough for 3 months, history of liver cancer in family  EXAM: CHEST  2 VIEW  COMPARISON:  None.  FINDINGS: The heart size and mediastinal contours are within normal limits. Both lungs are clear. The visualized skeletal structures are unremarkable.  IMPRESSION: No active cardiopulmonary disease.   Electronically Signed   By: Esperanza Heir M.D.   On: 09/29/2013 14:58    PE is normal.  Getting CXR   MDM   1. Persistent dry cough    Significant improvement with duoneb.  Discharging with albuterol and tessalon perles.  F/u if not improving.     Meds ordered this encounter  Medications  . ipratropium-albuterol (DUONEB) 0.5-2.5 (3) MG/3ML nebulizer solution 3 mL    Sig:   . benzonatate (TESSALON) 200 MG capsule    Sig: Take 1 capsule (200 mg total) by mouth 3 (three) times daily as needed for cough.    Dispense:  30 capsule    Refill:  1    Order Specific Question:  Supervising Provider    Answer:  Clementeen Graham, S [id number]  . Spacer/Aero-Holding Chambers (AEROCHAMBER PLUS FLO-VU LARGE) MISC    Sig: 1 each by Other route once.    Dispense:  1 each    Refill:  0    Order Specific Question:  Supervising Provider    Answer:  Clementeen Graham, S [id number]  . albuterol (PROVENTIL HFA;VENTOLIN HFA) 108 (90 BASE) MCG/ACT inhaler    Sig: Inhale 2 puffs  into the lungs every 4 (four) hours as needed (for cough).    Dispense:  1 Inhaler    Refill:  1    Please instruct in proper usage    Order Specific Question:  Supervising Provider    Answer:  Clementeen Graham, Kathie Rhodes [[id number]]       Graylon Good, PA-C 09/29/13 1616

## 2013-09-29 NOTE — ED Provider Notes (Signed)
Medical screening examination/treatment/procedure(s) were performed by a resident physician or non-physician practitioner and as the supervising physician I was immediately available for consultation/collaboration.  Keondra Haydu, MD    Milda Lindvall S T[place]seh Yeagley, MD 09/29/13 1915 

## 2013-10-11 ENCOUNTER — Other Ambulatory Visit (HOSPITAL_COMMUNITY): Payer: Self-pay | Admitting: Physician Assistant

## 2013-10-11 DIAGNOSIS — F259 Schizoaffective disorder, unspecified: Secondary | ICD-10-CM

## 2013-10-29 ENCOUNTER — Telehealth (HOSPITAL_COMMUNITY): Payer: Self-pay

## 2013-10-29 ENCOUNTER — Ambulatory Visit (INDEPENDENT_AMBULATORY_CARE_PROVIDER_SITE_OTHER): Payer: Federal, State, Local not specified - Other | Admitting: Psychiatry

## 2013-10-29 ENCOUNTER — Encounter (HOSPITAL_COMMUNITY): Payer: Self-pay | Admitting: Psychiatry

## 2013-10-29 VITALS — BP 139/89 | HR 92 | Ht 73.5 in | Wt 207.0 lb

## 2013-10-29 DIAGNOSIS — F259 Schizoaffective disorder, unspecified: Secondary | ICD-10-CM

## 2013-10-29 DIAGNOSIS — Z79899 Other long term (current) drug therapy: Secondary | ICD-10-CM

## 2013-10-29 MED ORDER — OLANZAPINE 5 MG PO TABS: ORAL_TABLET | ORAL | Status: DC

## 2013-10-29 NOTE — Progress Notes (Signed)
[facility] [address] Progress Note  KIENAN DOUBLIN [id number] 27 y.o.  07/17/2013 2:34 PM  Chief Complaint:  Establish care.    History of Present Illness:  Kenneth Welch is 27 year old African American single unemployed man who has been seen in this office since 2006 .  He was seen physician Asst. Jorje Guild who has left the practice.  This is the first time I am seeing this patient.  Patient has diagnoses of schizophrenia versus schizoaffective disorder.  Patient is taking olanzapine 5 mg at bedtime and Abilify 10 mg in the morning.  He also taking trazodone at bedtime.  It is unclear why he is taking 2 antipsychotic medication.  Patient a member taking these 2 medications since [facility].  Patient is living with his parents .  He is on disability.  He is sleeping okay.  He denies any recent paranoia or any hallucination but remains pretty withdrawn isolated and seclusive.  He does not have a good social network.  He talked about going to school but could not handle it .  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  He realized that he cannot go back to school and cannot work in the future.  He endorse going to the public places makes him more paranoid and worsening of the symptoms.  He admitted gain weight because of lack of physical exercise .  He is sleeping 6 hours.  He has no tremors or any shakes.  He denies any suicidal thoughts .  He is not smoking or using any illegal substances.  He has no blood work in recent years.  He has no primary care physician.  He goes to urgent care when he need for his physical needs.  He has taken albuterol and cough medication in recent months but otherwise his physical health is normal.  Suicidal Ideation: No Plan Formed: No Patient has means to carry out plan: No  Homicidal Ideation: No Plan Formed: No Patient has means to carry out plan: No  Review of  Systems: Psychiatric: Agitation: No Hallucination: No Depressed Mood: No Insomnia: No Hypersomnia: No Altered Concentration: No Feels Worthless: No Grandiose Ideas: No Belief In Special Powers: No New/Increased Substance Abuse: No Compulsions: No  Neurologic: Headache: No Seizure: No Paresthesias: No  Past Medical Family, Social History:  Patient has no current primary care physician.  He denies any family history of psychiatric illness.  He lives with his parents .  He has 2 older sister .  He has a high school education .  He is on disability.    Outpatient Encounter Prescriptions as of 10/29/2013  Medication Sig  . albuterol (PROVENTIL HFA;VENTOLIN HFA) 108 (90 BASE) MCG/ACT inhaler Inhale 2 puffs into the lungs every 4 (four) hours as needed (for cough).  . benztropine (COGENTIN) 1 MG tablet TAKE 1 TABLET BY MOUTH TWICE A DAY  . OLANZapine (ZYPREXA) 5 MG tablet TAKE 1 -2 TABLET AT BEDTIME  . traZODone (DESYREL) 50 MG tablet Take 1 tablet (50 mg total) by mouth at bedtime.  . [DISCONTINUED] ABILIFY 10 MG tablet TAKE 1 TABLET BY MOUTH EVERY DAY  . [DISCONTINUED] OLANZapine (ZYPREXA) 5 MG tablet TAKE 1 TABLET AT BEDTIME  . benzonatate (TESSALON) 200 MG capsule Take 1 capsule (200 mg total) by mouth 3 (three) times daily as needed for cough.  . Spacer/Aero-Holding Chambers (AEROCHAMBER PLUS FLO-VU LARGE) MISC 1 each by  Other route once.    Past Psychiatric History/Hospitalization(s): Patient endorse his psychotic symptoms started when he was in school.  He has at least 2-3 psychiatric hospitalization because of paranoia, psychosis, hallucinations and severe depression.  He remembered his thinking was disorganized.  He denies any suicidal attempt but endorse feeling of hopelessness and worthlessness.  He does not recall taking any other medication including Risperdal, Seroquel or Haldol.  He remembered taking olanzapine and Abilify for a long time. Anxiety: Yes Bipolar Disorder:  No Depression: Yes Mania: No Psychosis: Yes Schizophrenia: Yes Personality Disorder: No Hospitalization for psychiatric illness: No History of Electroconvulsive Shock Therapy: No Prior Suicide Attempts: No  Physical Exam: Constitutional:  BP 139/89  Pulse 92  Ht 6' 1.5" (1.867 m)  Wt 207 lb (93.895 kg)  BMI 26.94 kg/m2  General Appearance: alert, oriented, no acute distress, well nourished and casually dressed.  He maintained fair eye contact.  Musculoskeletal: Strength & Muscle Tone: within normal limits Gait & Station: normal Patient leans: N/A  Psychiatric: Speech (describe rate, volume, coherence, spontaneity, and abnormalities if any): Soft and coherent at a regular rate and rhythm.  Thought Process (describe rate, content, abstract reasoning, and computation): Within normal limits  Associations: Intact  Thoughts: normal  Mental Status: Orientation: oriented to person, place, time/date and situation Mood & Affect: flat affect Attention Span & Concentration: Intact  Medical Decision Making (Choose Three): Established Problem, Stable/Improving (1), Review of Psycho-Social Stressors (1), Review or order clinical lab tests (1), Decision to obtain old records (1), Review and summation of old records (2), New Problem, with no additional work-up planned (3), Review of Last Therapy Session (1), Review of Medication Regimen & Side Effects (2) and Review of New Medication or Change in Dosage (2)  Assessment: Axis I: Schizoaffective  Axis II: Defer  Axis III: Noncontributory  Axis IV: Mild to moderate  Axis V: 65   Plan:  I reviewed his chart and collateral information.  The patient has no blood work in many years.  It is unclear why he is taking to antipsychotic medication.  I believe both medication are subtherapeutic and he should be tried a single antipsychotic medication with moderate dose.  He also gained weight in the past 1 years.  I recommend to stop Abilify  and try Zyprexa up to 10 mg and continue trazodone 50 mg at bedtime and Cogentin 1 mg for shakes and tremors.  We will order CBC, CMP, hemoglobin A1c since he has not been blood work in a while.  Patient has decided to be discharged from his student loan and he is requesting to complete paperwork.  We will complete this form after I have discussed with him about his future .  The patient is not interested boy back to school as he had tried in the past causing worsening of the symptoms.  Patient is not interested in counseling.  I encouraged him to see primary care physician for a physical checkup .  I will see him again in 2 months.  Time spent 45 minutes.  More than 50% of the time spent in psychoeducation, counseling and coordination of care.  Discuss safety plan that anytime having active suicidal thoughts or homicidal thoughts then patient need to call 911 or go to the local emergency room.  Brailee Riede T., MD 10/29/2013

## 2013-11-05 ENCOUNTER — Telehealth (HOSPITAL_COMMUNITY): Payer: Self-pay

## 2013-11-05 NOTE — Telephone Encounter (Signed)
3:53pm 11/05/13 Pt came and pick-up paperwork for school/sh

## 2013-11-18 ENCOUNTER — Ambulatory Visit (HOSPITAL_COMMUNITY): Payer: Self-pay | Admitting: Psychiatry

## 2013-12-10 ENCOUNTER — Encounter (HOSPITAL_COMMUNITY): Payer: Self-pay | Admitting: Psychiatry

## 2013-12-10 ENCOUNTER — Ambulatory Visit (INDEPENDENT_AMBULATORY_CARE_PROVIDER_SITE_OTHER): Payer: Federal, State, Local not specified - Other | Admitting: Psychiatry

## 2013-12-10 VITALS — BP 134/67 | HR 84 | Ht 73.5 in | Wt 216.2 lb

## 2013-12-10 DIAGNOSIS — Z79899 Other long term (current) drug therapy: Secondary | ICD-10-CM

## 2013-12-10 DIAGNOSIS — F259 Schizoaffective disorder, unspecified: Secondary | ICD-10-CM

## 2013-12-10 MED ORDER — OLANZAPINE 5 MG PO TABS: ORAL_TABLET | ORAL | Status: DC

## 2013-12-10 MED ORDER — BENZTROPINE MESYLATE 1 MG PO TABS: ORAL_TABLET | ORAL | Status: DC

## 2013-12-10 NOTE — Progress Notes (Signed)
[facility] [address] Progress Note  TAYLIN LEDER [id number] 28 y.o.  07/17/2013 2:34 PM  Chief Complaint:  Followup and medication management.    History of Present Illness:  Juston came for his followup appointment.  He is taking Zyprexa 5 mg at bedtime and Cogentin 1 mg at bedtime.  He stopped taking Abilify and he has noticed improved in his sleep.  He has noticed increased energy.  Patient denies any tremors or shakes.  However he did not get blood work.  Patient told that he feels there is no need for blood work.  I explained that he has no blood work in a while and medication can cause increased blood sugar and the need to check his liver and kidney function test.  After some encouragement patient agreed to have the blood work.  He had a good Christmas.  He understands that he has chronic illness which will never go away.  He reported limited socialization and does not feel comfortable around people.  He is not drinking or using any illegal substance.  He is not using trazodone every night.  His vitals are stable but he has gained weight from the past.  He admitted not wanting his calorie intake and ongoing and exercise.  He is very reluctant for counseling .    Suicidal Ideation: No Plan Formed: No Patient has means to carry out plan: No  Homicidal Ideation: No Plan Formed: No Patient has means to carry out plan: No  Review of Systems: Psychiatric: Agitation: No Hallucination: No Depressed Mood: No Insomnia: No Hypersomnia: No Altered Concentration: No Feels Worthless: No Grandiose Ideas: No Belief In Special Powers: No New/Increased Substance Abuse: No Compulsions: No  Neurologic: Headache: No Seizure: No Paresthesias: No  Past Medical Family, Social History:  Patient has no current primary care physician.  He denies any family history of psychiatric illness.  He lives with his parents .  He has 2 older sister .  He has a high school education .   He is on disability.    Outpatient Encounter Prescriptions as of 12/10/2013  Medication Sig  . benztropine (COGENTIN) 1 MG tablet TAKE 1 TABLET AT BED TIME  . OLANZapine (ZYPREXA) 5 MG tablet TAKE 1 TABLET AT BEDTIME  . [DISCONTINUED] benztropine (COGENTIN) 1 MG tablet TAKE 1 TABLET BY MOUTH TWICE A DAY  . [DISCONTINUED] OLANZapine (ZYPREXA) 5 MG tablet TAKE 1 -2 TABLET AT BEDTIME  . albuterol (PROVENTIL HFA;VENTOLIN HFA) 108 (90 BASE) MCG/ACT inhaler Inhale 2 puffs into the lungs every 4 (four) hours as needed (for cough).  . Spacer/Aero-Holding Chambers (AEROCHAMBER PLUS FLO-VU LARGE) MISC 1 each by Other route once.  . traZODone (DESYREL) 50 MG tablet Take 1 tablet (50 mg total) by mouth at bedtime.  . [DISCONTINUED] benzonatate (TESSALON) 200 MG capsule Take 1 capsule (200 mg total) by mouth 3 (three) times daily as needed for cough.    Past Psychiatric History/Hospitalization(s): Patient endorse his psychotic symptoms started when he was in school.  He has at least 2-3 psychiatric hospitalization because of paranoia, psychosis, hallucinations and severe depression.  He remembered his thinking was disorganized.  He denies any suicidal attempt but endorse feeling of hopelessness and worthlessness.  He does not recall taking any other medication including Risperdal, Seroquel or Haldol.  He remembered taking olanzapine and Abilify for a long time. Anxiety: Yes Bipolar Disorder: No Depression: Yes Mania: No Psychosis: Yes Schizophrenia: Yes Personality Disorder: No Hospitalization for psychiatric illness: No History of  Electroconvulsive Shock Therapy: No Prior Suicide Attempts: No  Physical Exam: Constitutional:  BP 134/67  Pulse 84  Ht 6' 1.5" (1.867 m)  Wt 216 lb 3.2 oz (98.068 kg)  BMI 28.13 kg/m2  General Appearance: alert, oriented, no acute distress, well nourished and casually dressed.  He maintained fair eye contact.  Musculoskeletal: Strength & Muscle Tone: within normal  limits Gait & Station: normal Patient leans: N/A  Psychiatric: Speech (describe rate, volume, coherence, spontaneity, and abnormalities if any): Soft and coherent at a regular rate and rhythm.  Thought Process (describe rate, content, abstract reasoning, and computation): Within normal limits  Associations: Intact  Thoughts: normal  Mental Status: Orientation: oriented to person, place, time/date and situation Mood & Affect: flat affect Attention Span & Concentration: Intact  Medical Decision Making (Choose Three): Established Problem, Stable/Improving (1), Review of Last Therapy Session (1) and Review of Medication Regimen & Side Effects (2)  Assessment: Axis I: Schizoaffective  Axis II: Defer  Axis III: Noncontributory  Axis IV: Mild to moderate  Axis V: 65   Plan:  I strongly recommended to have his blood work .  Patient has lost the previous orders .  I will provide new prescription for CBC, CMP, hemoglobin A1c and lipid panel.  I strongly encouraged to get his blood work .  I will provide medication only for 4 weeks .  Recommended exercise and watching his calorie intake.  Followup in 4 weeks.  New description of Zyprexa 5 mg at bedtime and Cogentin 1 mg at bedtime is given.  Patient has refills remaining on his trazodone.  He is not taking trazodone every night.  Recommended to call us back if he has any question or any concern.   Genette Huertas T., MD 12/10/2013

## 2013-12-13 LAB — CBC WITH DIFFERENTIAL/PLATELET
Basophils Absolute: 0 10*3/uL (ref 0.0–0.1)
Basophils Relative: 0 % (ref 0–1)
Eosinophils Absolute: 0.1 10*3/uL (ref 0.0–0.7)
Eosinophils Relative: 2 % (ref 0–5)
HCT: 43.7 % (ref 39.0–52.0)
Hemoglobin: 15.3 g/dL (ref 13.0–17.0)
Lymphocytes Relative: 39 % (ref 12–46)
Lymphs Abs: 2.5 10*3/uL (ref 0.7–4.0)
MCH: 31.7 pg (ref 26.0–34.0)
MCHC: 35 g/dL (ref 30.0–36.0)
MCV: 90.7 fL (ref 78.0–100.0)
Monocytes Absolute: 0.5 10*3/uL (ref 0.1–1.0)
Monocytes Relative: 8 % (ref 3–12)
Neutro Abs: 3.4 10*3/uL (ref 1.7–7.7)
Neutrophils Relative %: 51 % (ref 43–77)
Platelets: 232 10*3/uL (ref 150–400)
RBC: 4.82 MIL/uL (ref 4.22–5.81)
RDW: 14.9 % (ref 11.5–15.5)
WBC: 6.6 10*3/uL (ref 4.0–10.5)

## 2013-12-13 LAB — HEMOGLOBIN A1C
Hgb A1c MFr Bld: 5.9 % — ABNORMAL HIGH (ref <5.7)
Mean Plasma Glucose: 123 mg/dL — ABNORMAL HIGH (ref <117)

## 2013-12-14 LAB — COMPREHENSIVE METABOLIC PANEL
ALT: 30 U/L (ref 0–53)
AST: 24 U/L (ref 0–37)
Albumin: 4.6 g/dL (ref 3.5–5.2)
Alkaline Phosphatase: 52 U/L (ref 39–117)
BUN: 13 mg/dL (ref 6–23)
CO2: 27 mEq/L (ref 19–32)
Calcium: 10.1 mg/dL (ref 8.4–10.5)
Chloride: 102 mEq/L (ref 96–112)
Creat: 0.97 mg/dL (ref 0.50–1.35)
Glucose, Bld: 87 mg/dL (ref 70–99)
Potassium: 4.5 mEq/L (ref 3.5–5.3)
Sodium: 138 mEq/L (ref 135–145)
Total Bilirubin: 0.5 mg/dL (ref 0.3–1.2)
Total Protein: 6.9 g/dL (ref 6.0–8.3)

## 2013-12-14 LAB — LIPID PANEL
Cholesterol: 203 mg/dL — ABNORMAL HIGH (ref 0–200)
HDL: 55 mg/dL (ref >39)
LDL Cholesterol: 128 mg/dL — ABNORMAL HIGH (ref 0–99)
Total CHOL/HDL Ratio: 3.7 Ratio
Triglycerides: 100 mg/dL (ref <150)
VLDL: 20 mg/dL (ref 0–40)

## 2013-12-14 LAB — TSH: TSH: 1.917 u[IU]/mL (ref 0.350–4.500)

## 2014-01-07 ENCOUNTER — Encounter (HOSPITAL_COMMUNITY): Payer: Self-pay | Admitting: Psychiatry

## 2014-01-07 ENCOUNTER — Ambulatory Visit (INDEPENDENT_AMBULATORY_CARE_PROVIDER_SITE_OTHER): Payer: Federal, State, Local not specified - Other | Admitting: Psychiatry

## 2014-01-07 VITALS — BP 123/69 | HR 86 | Ht 73.5 in | Wt 227.8 lb

## 2014-01-07 DIAGNOSIS — F259 Schizoaffective disorder, unspecified: Secondary | ICD-10-CM

## 2014-01-07 MED ORDER — BENZTROPINE MESYLATE 1 MG PO TABS: ORAL_TABLET | ORAL | Status: DC

## 2014-01-07 MED ORDER — OLANZAPINE 5 MG PO TABS: ORAL_TABLET | ORAL | Status: DC

## 2014-01-07 NOTE — Progress Notes (Signed)
[place] [phone] Progress Note  Kenneth Welch [id number] 28 y.o.  07/17/2013 2:34 PM  Chief Complaint:  Followup and medication management.    History of Present Illness:  Tel came for his followup appointment.  He is compliant with Zyprexa and Cogentin.  He feels more energy since Abilify discontinued.  He had a blood work.  His blood is also normal.  His hemoglobin A1c is 5.9.  He has gained weight from the past but overall his depression is improved from the past.  He denies any hallucinations, paranoia or any agitation.  He has no tremors or shakes.  He does not require trazodone because he is sleeping better.  He was to continue his current psychotropic medication.  He does not want any counseling.  Suicidal Ideation: No Plan Formed: No Patient has means to carry out plan: No  Homicidal Ideation: No Plan Formed: No Patient has means to carry out plan: No  Review of Systems: Psychiatric: Agitation: No Hallucination: No Depressed Mood: No Insomnia: No Hypersomnia: No Altered Concentration: No Feels Worthless: No Grandiose Ideas: No Belief In Special Powers: No New/Increased Substance Abuse: No Compulsions: No  Neurologic: Headache: No Seizure: No Paresthesias: No  Past Medical Family, Social History:  Patient has no current primary care physician.  He denies any family history of psychiatric illness.  He lives with his parents .  He has 2 older sister .  He has a high school education .  He is on disability.    Outpatient Encounter Prescriptions as of 01/07/2014  Medication Sig  . benztropine (COGENTIN) 1 MG tablet TAKE 1 TABLET AT BED TIME  . OLANZapine (ZYPREXA) 5 MG tablet TAKE 1 TABLET AT BEDTIME  . Spacer/Aero-Holding Chambers (AEROCHAMBER PLUS FLO-VU LARGE) MISC 1 each by Other route once.  . [DISCONTINUED] benztropine (COGENTIN) 1 MG tablet TAKE 1 TABLET AT BED TIME  . [DISCONTINUED] OLANZapine (ZYPREXA) 5 MG tablet TAKE 1 TABLET AT  BEDTIME  . albuterol (PROVENTIL HFA;VENTOLIN HFA) 108 (90 BASE) MCG/ACT inhaler Inhale 2 puffs into the lungs every 4 (four) hours as needed (for cough).  . traZODone (DESYREL) 50 MG tablet Take 1 tablet (50 mg total) by mouth at bedtime.    Past Psychiatric History/Hospitalization(s): Patient endorse his psychotic symptoms started when he was in school.  He has at least 2-3 psychiatric hospitalization because of paranoia, psychosis, hallucinations and severe depression.  He remembered his thinking was disorganized.  He denies any suicidal attempt but endorse feeling of hopelessness and worthlessness.  He does not recall taking any other medication including Risperdal, Seroquel or Haldol.  He remembered taking olanzapine and Abilify for a long time. Anxiety: Yes Bipolar Disorder: No Depression: Yes Mania: No Psychosis: Yes Schizophrenia: Yes Personality Disorder: No Hospitalization for psychiatric illness: No History of Electroconvulsive Shock Therapy: No Prior Suicide Attempts: No  Physical Exam: Constitutional:  BP 123/69  Pulse 86  Ht 6' 1.5" (1.867 m)  Wt 227 lb 12.8 oz (103.329 kg)  BMI 29.64 kg/m2  General Appearance: alert, oriented, no acute distress, well nourished and casually dressed.  He maintained fair eye contact.  Recent Results (from the past 2160 hour(s))  CBC WITH DIFFERENTIAL     Status: None   Collection Time    12/13/13 12:22 PM      Result Value Range   WBC 6.6  4.0 - 10.5 K/uL   RBC 4.82  4.22 - 5.81 MIL/uL   Hemoglobin 15.3  13.0 - 17.0 g/dL  HCT 43.7  39.0 - 52.0 %   MCV 90.7  78.0 - 100.0 fL   MCH 31.7  26.0 - 34.0 pg   MCHC 35.0  30.0 - 36.0 g/dL   RDW 14.9  11.5 - 15.5 %   Platelets 232  150 - 400 K/uL   Neutrophils Relative % 51  43 - 77 %   Neutro Abs 3.4  1.7 - 7.7 K/uL   Lymphocytes Relative 39  12 - 46 %   Lymphs Abs 2.5  0.7 - 4.0 K/uL   Monocytes Relative 8  3 - 12 %   Monocytes Absolute 0.5  0.1 - 1.0 K/uL   Eosinophils Relative 2   0 - 5 %   Eosinophils Absolute 0.1  0.0 - 0.7 K/uL   Basophils Relative 0  0 - 1 %   Basophils Absolute 0.0  0.0 - 0.1 K/uL   Smear Review Criteria for review not met    COMPREHENSIVE METABOLIC PANEL     Status: None   Collection Time    12/13/13 12:22 PM      Result Value Range   Sodium 138  135 - 145 mEq/L   Potassium 4.5  3.5 - 5.3 mEq/L   Chloride 102  96 - 112 mEq/L   CO2 27  19 - 32 mEq/L   Glucose, Bld 87  70 - 99 mg/dL   BUN 13  6 - 23 mg/dL   Creat 0.97  0.50 - 1.35 mg/dL   Total Bilirubin 0.5  0.3 - 1.2 mg/dL   Alkaline Phosphatase 52  39 - 117 U/L   AST 24  0 - 37 U/L   ALT 30  0 - 53 U/L   Total Protein 6.9  6.0 - 8.3 g/dL   Albumin 4.6  3.5 - 5.2 g/dL   Calcium 10.1  8.4 - 10.5 mg/dL  TSH     Status: None   Collection Time    12/13/13 12:22 PM      Result Value Range   TSH 1.917  0.350 - 4.500 uIU/mL  LIPID PANEL     Status: Abnormal   Collection Time    12/13/13 12:22 PM      Result Value Range   Cholesterol 203 (*) 0 - 200 mg/dL   Comment: ATP III Classification:           < 200        mg/dL        Desirable          200 - 239     mg/dL        Borderline High          >= 240        mg/dL        High         Triglycerides 100  <150 mg/dL   HDL 55  >39 mg/dL   Total CHOL/HDL Ratio 3.7     VLDL 20  0 - 40 mg/dL   LDL Cholesterol 128 (*) 0 - 99 mg/dL   Comment:       Total Cholesterol/HDL Ratio:CHD Risk                            Coronary Heart Disease Risk Table  Men       Women              1/2 Average Risk              3.4        3.3                  Average Risk              5.0        4.4               2X Average Risk              9.6        7.1               3X Average Risk             23.4       11.0     Use the calculated Patient Ratio above and the CHD Risk table      to determine the patient's CHD Risk.     ATP III Classification (LDL):           < 100        mg/dL         Optimal          100 - 129      mg/dL         Near or Above Optimal          130 - 159     mg/dL         Borderline High          160 - 189     mg/dL         High           > 190        mg/dL         Very High        HEMOGLOBIN A1C     Status: Abnormal   Collection Time    12/13/13 12:22 PM      Result Value Range   Hemoglobin A1C 5.9 (*) <5.7 %   Comment:                                                                            According to the ADA Clinical Practice Recommendations for 2011, when     HbA1c is used as a screening test:             >=6.5%   Diagnostic of Diabetes Mellitus                (if abnormal result is confirmed)           5.7-6.4%   Increased risk of developing Diabetes Mellitus           References:Diagnosis and Classification of Diabetes Mellitus,Diabetes     MBWG,6659,93(TTSVX 1):S62-S69 and Standards of Medical Care in             Diabetes - 2011,Diabetes Care,2011,34 (Suppl 1):S11-S61.         Mean Plasma Glucose 123 (*) <117 mg/dL  Musculoskeletal: Strength & Muscle Tone: within normal limits Gait & Station: normal Patient leans: The patient has a normal posture  Psychiatric: Speech (describe rate, volume, coherence, spontaneity, and abnormalities if any): Soft and coherent at a regular rate and rhythm.  Thought Process (describe rate, content, abstract reasoning, and computation): Within normal limits  Associations: Intact  Thoughts: normal  Mental Status: Orientation: oriented to person, place, time/date and situation Mood & Affect: flat affect Attention Span & Concentration: Intact  Established Problem, Stable/Improving (1), Review or order clinical lab tests (1), Review of Last Therapy Session (1) and Review of Medication Regimen & Side Effects (2)  Assessment: Axis I: Schizoaffective  Axis II: Defer  Axis III: Noncontributory  Axis IV: Mild to moderate  Axis V: 65   Plan:  I review the blood work results with him.  His total cholesterol is slightly  high but his CBC and chemistries normal.  Discussed the blood results with him.  Recommend to call us back if there is any question on any concern.  All continue Zyprexa 5 mg at bedtime and Cogentin 1 mg daily.  Followup in 3 months.  Recommended to call us back if he has any question or any concern.   Nykolas Bacallao T., MD 01/07/2014

## 2014-01-23 ENCOUNTER — Ambulatory Visit (INDEPENDENT_AMBULATORY_CARE_PROVIDER_SITE_OTHER): Payer: Federal, State, Local not specified - Other | Admitting: Psychiatry

## 2014-01-23 ENCOUNTER — Encounter (HOSPITAL_COMMUNITY): Payer: Self-pay | Admitting: Psychiatry

## 2014-01-23 VITALS — BP 137/76 | HR 95 | Ht 73.5 in | Wt 229.6 lb

## 2014-01-23 DIAGNOSIS — F259 Schizoaffective disorder, unspecified: Secondary | ICD-10-CM

## 2014-01-23 NOTE — Progress Notes (Signed)
[name] Progress Note  Kenneth Welch [id number] 28 y.o.  07/17/2013 2:34 PM  Chief Complaint:  Followup.      History of Present Illness:  Kenneth Welch came for his followup appointment.  He is oriented and has a scheduled appointment.  He brought his paper that need to be completed for his student loan .  We have completed the paperwork patient requires more information to be completed.  Patient continues to have physical symptoms of psychosis.  He does have limited socialization and does not leave his house unless needed.  However overall his energy is much improved since he is now taking Abilify.  He is compliant with Zyprexa and Cogentin.  Denies any tremors or shakes.  He denies any irritability or anger.  He sleeping better.  He has increased energy.  He is not drinking or using any illegal substances.  Suicidal Ideation: No Plan Formed: No Patient has means to carry out plan: No  Homicidal Ideation: No Plan Formed: No Patient has means to carry out plan: No  Review of Systems: Psychiatric: Agitation: No Hallucination: No Depressed Mood: No Insomnia: No Hypersomnia: No Altered Concentration: No Feels Worthless: No Grandiose Ideas: No Belief In Special Powers: No New/Increased Substance Abuse: No Compulsions: No  Neurologic: Headache: No Seizure: No Paresthesias: No  Past Medical Family, Social History:  Patient has no current primary care physician.  He denies any family history of psychiatric illness.  He lives with his parents .  He has 2 older sister .  He has a high school education .  He is on disability.    Outpatient Encounter Prescriptions as of 01/23/2014  Medication Sig  . albuterol (PROVENTIL HFA;VENTOLIN HFA) 108 (90 BASE) MCG/ACT inhaler Inhale 2 puffs into the lungs every 4 (four) hours as needed (for cough).  . benztropine (COGENTIN) 1 MG tablet TAKE 1 TABLET AT BED TIME  . OLANZapine (ZYPREXA) 5 MG tablet TAKE 1 TABLET AT  BEDTIME  . Spacer/Aero-Holding Chambers (AEROCHAMBER PLUS FLO-VU LARGE) MISC 1 each by Other route once.  . traZODone (DESYREL) 50 MG tablet Take 1 tablet (50 mg total) by mouth at bedtime.    Past Psychiatric History/Hospitalization(s): Patient endorse his psychotic symptoms started when he was in school.  He has at least 2-3 psychiatric hospitalization because of paranoia, psychosis, hallucinations and severe depression.  He remembered his thinking was disorganized.  He denies any suicidal attempt but endorse feeling of hopelessness and worthlessness.  He does not recall taking any other medication including Risperdal, Seroquel or Haldol.  He remembered taking olanzapine and Abilify for a long time. Anxiety: Yes Bipolar Disorder: No Depression: Yes Mania: No Psychosis: Yes Schizophrenia: Yes Personality Disorder: No Hospitalization for psychiatric illness: No History of Electroconvulsive Shock Therapy: No Prior Suicide Attempts: No  Physical Exam: Constitutional:  BP 137/76  Pulse 95  Ht 6' 1.5" (1.867 m)  Wt 229 lb 9.6 oz (104.146 kg)  BMI 29.88 kg/m2  General Appearance: alert, oriented, no acute distress, well nourished and casually dressed.  He maintained fair eye contact.  Recent Results (from the past 2160 hour(s))  CBC WITH DIFFERENTIAL     Status: None   Collection Time    12/13/13 12:22 PM      Result Value Ref Range   WBC 6.6  4.0 - 10.5 K/uL   RBC 4.82  4.22 - 5.81 MIL/uL   Hemoglobin 15.3  13.0 - 17.0 g/dL   HCT 43.7  39.0 - 52.0 %  MCV 90.7  78.0 - 100.0 fL   MCH 31.7  26.0 - 34.0 pg   MCHC 35.0  30.0 - 36.0 g/dL   RDW 14.9  11.5 - 15.5 %   Platelets 232  150 - 400 K/uL   Neutrophils Relative % 51  43 - 77 %   Neutro Abs 3.4  1.7 - 7.7 K/uL   Lymphocytes Relative 39  12 - 46 %   Lymphs Abs 2.5  0.7 - 4.0 K/uL   Monocytes Relative 8  3 - 12 %   Monocytes Absolute 0.5  0.1 - 1.0 K/uL   Eosinophils Relative 2  0 - 5 %   Eosinophils Absolute 0.1  0.0 - 0.7  K/uL   Basophils Relative 0  0 - 1 %   Basophils Absolute 0.0  0.0 - 0.1 K/uL   Smear Review Criteria for review not met    COMPREHENSIVE METABOLIC PANEL     Status: None   Collection Time    12/13/13 12:22 PM      Result Value Ref Range   Sodium 138  135 - 145 mEq/L   Potassium 4.5  3.5 - 5.3 mEq/L   Chloride 102  96 - 112 mEq/L   CO2 27  19 - 32 mEq/L   Glucose, Bld 87  70 - 99 mg/dL   BUN 13  6 - 23 mg/dL   Creat 0.97  0.50 - 1.35 mg/dL   Total Bilirubin 0.5  0.3 - 1.2 mg/dL   Alkaline Phosphatase 52  39 - 117 U/L   AST 24  0 - 37 U/L   ALT 30  0 - 53 U/L   Total Protein 6.9  6.0 - 8.3 g/dL   Albumin 4.6  3.5 - 5.2 g/dL   Calcium 10.1  8.4 - 10.5 mg/dL  TSH     Status: None   Collection Time    12/13/13 12:22 PM      Result Value Ref Range   TSH 1.917  0.350 - 4.500 uIU/mL  LIPID PANEL     Status: Abnormal   Collection Time    12/13/13 12:22 PM      Result Value Ref Range   Cholesterol 203 (*) 0 - 200 mg/dL   Comment: ATP III Classification:           < 200        mg/dL        Desirable          200 - 239     mg/dL        Borderline High          >= 240        mg/dL        High         Triglycerides 100  <150 mg/dL   HDL 55  >39 mg/dL   Total CHOL/HDL Ratio 3.7     VLDL 20  0 - 40 mg/dL   LDL Cholesterol 128 (*) 0 - 99 mg/dL   Comment:       Total Cholesterol/HDL Ratio:CHD Risk                            Coronary Heart Disease Risk Table  Men       Women              1/2 Average Risk              3.4        3.3                  Average Risk              5.0        4.4               2X Average Risk              9.6        7.1               3X Average Risk             23.4       11.0     Use the calculated Patient Ratio above and the CHD Risk table      to determine the patient's CHD Risk.     ATP III Classification (LDL):           < 100        mg/dL         Optimal          100 - 129     mg/dL         Near or Above  Optimal          130 - 159     mg/dL         Borderline High          160 - 189     mg/dL         High           > 190        mg/dL         Very High        HEMOGLOBIN A1C     Status: Abnormal   Collection Time    12/13/13 12:22 PM      Result Value Ref Range   Hemoglobin A1C 5.9 (*) <5.7 %   Comment:                                                                            According to the ADA Clinical Practice Recommendations for 2011, when     HbA1c is used as a screening test:             >=6.5%   Diagnostic of Diabetes Mellitus                (if abnormal result is confirmed)           5.7-6.4%   Increased risk of developing Diabetes Mellitus           References:Diagnosis and Classification of Diabetes Mellitus,Diabetes     BWIO,0355,97(CBULA 1):S62-S69 and Standards of Medical Care in             Diabetes - 2011,Diabetes Care,2011,34 (Suppl 1):S11-S61.         Mean Plasma Glucose 123 (*) <117 mg/dL  Musculoskeletal: Strength & Muscle Tone: within normal limits Gait & Station: normal Patient leans: The patient has a normal posture  Psychiatric: Speech (describe rate, volume, coherence, spontaneity, and abnormalities if any): Soft and coherent at a regular rate and rhythm.  Thought Process (describe rate, content, abstract reasoning, and computation): Within normal limits  Associations: Intact, fund of knowledge is fair.  Thoughts: normal, he has some thought blocking  Mental Status: Orientation: oriented to person, place, time/date and situation Mood & Affect: flat affect Attention Span & Concentration: Intact  Established Problem, Stable/Improving (1), Review or order clinical lab tests (1), Review of Last Therapy Session (1) and Review of Medication Regimen & Side Effects (2)  Assessment: Axis I: Schizoaffective  Axis II: Defer  Axis III: Noncontributory  Axis IV: Mild to moderate  Axis V: 65   Plan:  I completed his paperwork for his student loan  .  Patient does not able to gainful employment because of his chronic condition.  I recommended to continue his Zyprexa and Cogentin at present dose.  Patient is scheduled to see me in May .  Recommended to call us back if he has any question or any concern.  Corlette Ciano T., MD 01/23/2014

## 2014-04-07 ENCOUNTER — Ambulatory Visit (INDEPENDENT_AMBULATORY_CARE_PROVIDER_SITE_OTHER): Payer: Medicaid Other | Admitting: Psychiatry

## 2014-04-07 ENCOUNTER — Encounter (HOSPITAL_COMMUNITY): Payer: Self-pay | Admitting: Psychiatry

## 2014-04-07 VITALS — BP 126/74 | HR 85 | Ht 73.5 in | Wt 236.0 lb

## 2014-04-07 DIAGNOSIS — F259 Schizoaffective disorder, unspecified: Secondary | ICD-10-CM

## 2014-04-07 MED ORDER — BENZTROPINE MESYLATE 1 MG PO TABS: ORAL_TABLET | ORAL | Status: DC

## 2014-04-07 MED ORDER — OLANZAPINE 5 MG PO TABS: ORAL_TABLET | ORAL | Status: DC

## 2014-04-07 NOTE — Progress Notes (Signed)
[facility] [id number] Progress Note  Kenneth CoulterChristopher L Welch [id number] 28 y.o.  07/17/2013 2:34 PM  Chief Complaint:  Medication management and Followup.      History of Present Illness:  Kenneth DeerChristopher came for his followup appointment.  He is taking his medication.  He denied any side effects or any other issues.  He is more social and active.  Denies any hallucination, paranoia or any irritability.  He sleeping good.  His vitals are stable.  Denies any agitation or any anger.  He is relieved that he does not have to pay student loan.  He is not drinking or using any illegal substances.  He reported his energy level is good.  He is taking Zyprexa and Cogentin.  He has no tremors or shakes.  Patient lives with his parents.  Suicidal Ideation: No Plan Formed: No Patient has means to carry out plan: No  Homicidal Ideation: No Plan Formed: No Patient has means to carry out plan: No  Review of Systems: Psychiatric: Agitation: No Hallucination: No Depressed Mood: No Insomnia: No Hypersomnia: No Altered Concentration: No Feels Worthless: No Grandiose Ideas: No Belief In Special Powers: No New/Increased Substance Abuse: No Compulsions: No  Neurologic: Headache: No Seizure: No Paresthesias: No   Outpatient Encounter Prescriptions as of 04/07/2014  Medication Sig  . albuterol (PROVENTIL HFA;VENTOLIN HFA) 108 (90 BASE) MCG/ACT inhaler Inhale 2 puffs into the lungs every 4 (four) hours as needed (for cough).  . benztropine (COGENTIN) 1 MG tablet TAKE 1 TABLET AT BED TIME  . OLANZapine (ZYPREXA) 5 MG tablet TAKE 1 TABLET AT BEDTIME  . Spacer/Aero-Holding Chambers (AEROCHAMBER PLUS FLO-VU LARGE) MISC 1 each by Other route once.  . [DISCONTINUED] benztropine (COGENTIN) 1 MG tablet TAKE 1 TABLET AT BED TIME  . [DISCONTINUED] OLANZapine (ZYPREXA) 5 MG tablet TAKE 1 TABLET AT BEDTIME  . [DISCONTINUED] traZODone (DESYREL) 50 MG tablet Take 1 tablet (50 mg total) by mouth at bedtime.     Past Psychiatric History/Hospitalization(s): Patient has multiple psychiatric hospitalization because of paranoia, hallucination, psychosis and severe depression.  In the past he has taken Abilify . Anxiety: Yes Bipolar Disorder: No Depression: Yes Mania: No Psychosis: Yes Schizophrenia: Yes Personality Disorder: No Hospitalization for psychiatric illness: No History of Electroconvulsive Shock Therapy: No Prior Suicide Attempts: No  Physical Exam: Constitutional:  BP 126/74  Pulse 85  Ht 6' 1.5" (1.867 m)  Wt 236 lb (107.049 kg)  BMI 30.71 kg/m2  General Appearance: well nourished and casually dressed.  He maintained fair eye contact.  No results found for this or any previous visit (from the past 2160 hour(s)). Musculoskeletal: Strength & Muscle Tone: within normal limits Gait & Station: normal Patient leans: The patient has a normal posture  Psychiatric: Speech (describe rate, volume, coherence, spontaneity, and abnormalities if any): Soft and coherent at a regular rate and rhythm.  Thought Process (describe rate, content, abstract reasoning, and computation): Within normal limits  Associations: Intact, fund of knowledge is fair.  Thoughts: normal, he has some thought blocking  Mental Status: Orientation: oriented to person, place, time/date and situation Mood & Affect: flat affect Attention Span & Concentration: Intact  Established Problem, Stable/Improving (1), Review of Last Therapy Session (1) and Review of Medication Regimen & Side Effects (2)  Assessment: Axis I: Schizoaffective  Axis II: Defer  Axis III: Noncontributory  Axis IV: Mild to moderate  Axis V: 65   Plan:  Patient is in stable on his current psychotropic medication.  I recommended to  continue his Zyprexa and Cogentin at present dose.  Recommended to call us back if he has any question or any concern.  Guillermo Nehring T., MD 04/07/2014

## 2014-07-08 ENCOUNTER — Ambulatory Visit (HOSPITAL_COMMUNITY): Payer: Self-pay | Admitting: Psychiatry

## 2014-07-23 ENCOUNTER — Encounter (HOSPITAL_COMMUNITY): Payer: Self-pay | Admitting: Psychiatry

## 2014-07-23 ENCOUNTER — Ambulatory Visit (INDEPENDENT_AMBULATORY_CARE_PROVIDER_SITE_OTHER): Payer: Medicaid Other | Admitting: Psychiatry

## 2014-07-23 VITALS — BP 144/66 | HR 96 | Ht 73.0 in | Wt 250.4 lb

## 2014-07-23 DIAGNOSIS — F259 Schizoaffective disorder, unspecified: Secondary | ICD-10-CM

## 2014-07-23 MED ORDER — BENZTROPINE MESYLATE 1 MG PO TABS: ORAL_TABLET | ORAL | Status: DC

## 2014-07-23 MED ORDER — OLANZAPINE 5 MG PO TABS: ORAL_TABLET | ORAL | Status: DC

## 2014-07-23 NOTE — Progress Notes (Signed)
[facility] [id number] Progress Note  Kenneth CoulterChristopher L Welch [id number] 28 y.o.  07/17/2013 2:34 PM  Chief Complaint:  Medication management and Followup.      History of Present Illness:  Kenneth Welch came for his followup appointment.  He is taking his medication.  He has gain weight from past. He admitted not doing exercise and playing basket ball games. He denies any agitation, anger or hallucination and paranoia.  His sleep is good.  He does not interact with a lot of people and usually he stays to himself.  However there has been no new issues .  His mood is stable .  He is now drinking a using any illegal substances.  He lives with his parents.  He has no tremors or shakes.  Suicidal Ideation: No Plan Formed: No Patient has means to carry out plan: No  Homicidal Ideation: No Plan Formed: No Patient has means to carry out plan: No  Review of Systems: Psychiatric: Agitation: No Hallucination: No Depressed Mood: No Insomnia: No Hypersomnia: No Altered Concentration: No Feels Worthless: No Grandiose Ideas: No Belief In Special Powers: No New/Increased Substance Abuse: No Compulsions: No  Neurologic: Headache: No Seizure: No Paresthesias: No   Outpatient Encounter Prescriptions as of 07/23/2014  Medication Sig  . albuterol (PROVENTIL HFA;VENTOLIN HFA) 108 (90 BASE) MCG/ACT inhaler Inhale 2 puffs into the lungs every 4 (four) hours as needed (for cough).  . benztropine (COGENTIN) 1 MG tablet TAKE 1 TABLET AT BED TIME  . OLANZapine (ZYPREXA) 5 MG tablet TAKE 1 TABLET AT BEDTIME  . Spacer/Aero-Holding Chambers (AEROCHAMBER PLUS FLO-VU LARGE) MISC 1 each by Other route once.  . [DISCONTINUED] benztropine (COGENTIN) 1 MG tablet TAKE 1 TABLET AT BED TIME  . [DISCONTINUED] OLANZapine (ZYPREXA) 5 MG tablet TAKE 1 TABLET AT BEDTIME    Past Psychiatric History/Hospitalization(s): Patient has multiple psychiatric hospitalization because of paranoia, hallucination,  psychosis and severe depression.  In the past he has taken Abilify . Anxiety: Yes Bipolar Disorder: No Depression: Yes Mania: No Psychosis: Yes Schizophrenia: Yes Personality Disorder: No Hospitalization for psychiatric illness: No History of Electroconvulsive Shock Therapy: No Prior Suicide Attempts: No  Physical Exam: Constitutional:  BP 144/66  Pulse 96  Ht 6\' 1"  (1.854 m)  Wt 250 lb 6.4 oz (113.581 kg)  BMI 33.04 kg/m2  General Appearance: well nourished and casually dressed.  He maintained fair eye contact.  No results found for this or any previous visit (from the past 2160 hour(s)). Musculoskeletal: Strength & Muscle Tone: within normal limits Gait & Station: normal Patient leans: The patient has a normal posture  Psychiatric: Speech (describe rate, volume, coherence, spontaneity, and abnormalities if any): Soft and coherent at a regular rate and rhythm.  Thought Process (describe rate, content, abstract reasoning, and computation): Within normal limits  Associations: Intact, fund of knowledge is fair.  Thoughts: normal, he has some thought blocking  Mental Status: Orientation: oriented to person, place, time/date and situation Mood & Affect: flat affect Attention Span & Concentration: Intact  Established Problem, Stable/Improving (1), Review of Last Therapy Session (1) and Review of Medication Regimen & Side Effects (2)  Assessment: Axis I: Schizoaffective  Axis II: Defer  Axis III: Noncontributory  Axis IV: Mild to moderate  Axis V: 65   Plan:  Patient is stable on his current psychotropic medication.  I will continue Zyprexa 5 mg at bedtime and Cogentin 1 mg at bedtime.  Recommended to call us back if he has any question or any  concern.  Followup in 3 months.  Wenceslao Loper T., MD 07/23/2014

## 2014-07-30 ENCOUNTER — Other Ambulatory Visit (HOSPITAL_COMMUNITY): Payer: Self-pay | Admitting: Psychiatry

## 2014-08-03 ENCOUNTER — Other Ambulatory Visit (HOSPITAL_COMMUNITY): Payer: Self-pay | Admitting: Psychiatry

## 2014-08-03 NOTE — Telephone Encounter (Signed)
Given script on 07/23/14 with refills.

## 2014-10-13 ENCOUNTER — Ambulatory Visit (INDEPENDENT_AMBULATORY_CARE_PROVIDER_SITE_OTHER): Payer: Medicaid Other | Admitting: Psychiatry

## 2014-10-13 ENCOUNTER — Encounter (HOSPITAL_COMMUNITY): Payer: Self-pay | Admitting: Psychiatry

## 2014-10-13 VITALS — BP 138/82 | HR 96 | Ht 73.0 in | Wt 259.8 lb

## 2014-10-13 DIAGNOSIS — F259 Schizoaffective disorder, unspecified: Secondary | ICD-10-CM

## 2014-10-13 MED ORDER — OLANZAPINE 5 MG PO TABS: ORAL_TABLET | ORAL | Status: DC

## 2014-10-13 MED ORDER — TRAZODONE HCL 50 MG PO TABS: 50.0000 mg | ORAL_TABLET | Freq: Every day | ORAL | Status: DC

## 2014-10-13 MED ORDER — BENZTROPINE MESYLATE 1 MG PO TABS: ORAL_TABLET | ORAL | Status: DC

## 2014-10-13 NOTE — Progress Notes (Addendum)
[facility] [id number] Progress Note  Kenneth CoulterChristopher L Welch [id number] 28 y.o.  07/17/2013 2:34 PM  Chief Complaint:  Medication management and Followup.      History of Present Illness:  Kenneth DeerChristopher came for his followup appointment.  He is compliant with his medication.  Recently he start taking trazodone because he could not sleep.  He has gained weight from the past.  He admitted not playing basketball, doing exercise or watching his calorie intake.  He feels his medicine is working because he has no paranoia, hallucination, agitation or any crying spells.  He is not drinking or using any illegal substances.  He lives with his parents.  His appetite is okay however he has increase weight from the past.  Suicidal Ideation: No Plan Formed: No Patient has means to carry out plan: No  Homicidal Ideation: No Plan Formed: No Patient has means to carry out plan: No  Review of Systems: Psychiatric: Agitation: No Hallucination: No Depressed Mood: No Insomnia: No Hypersomnia: No Altered Concentration: No Feels Worthless: No Grandiose Ideas: No Belief In Special Powers: No New/Increased Substance Abuse: No Compulsions: No  Neurologic: Headache: No Seizure: No Paresthesias: No   Outpatient Encounter Prescriptions as of 10/13/2014  Medication Sig  . benztropine (COGENTIN) 1 MG tablet TAKE 1 TABLET AT BED TIME  . OLANZapine (ZYPREXA) 5 MG tablet TAKE 1 TABLET AT BEDTIME  . Spacer/Aero-Holding Chambers (AEROCHAMBER PLUS FLO-VU LARGE) MISC 1 each by Other route once.  . traZODone (DESYREL) 50 MG tablet Take 1 tablet (50 mg total) by mouth at bedtime.  . [DISCONTINUED] albuterol (PROVENTIL HFA;VENTOLIN HFA) 108 (90 BASE) MCG/ACT inhaler Inhale 2 puffs into the lungs every 4 (four) hours as needed (for cough).  . [DISCONTINUED] benztropine (COGENTIN) 1 MG tablet TAKE 1 TABLET AT BED TIME  . [DISCONTINUED] OLANZapine (ZYPREXA) 5 MG tablet TAKE 1 TABLET AT BEDTIME  .  [DISCONTINUED] traZODone (DESYREL) 50 MG tablet     Past Psychiatric History/Hospitalization(s): Patient has multiple psychiatric hospitalization because of paranoia, hallucination, psychosis and severe depression.  In the past he has taken Abilify . Anxiety: Yes Bipolar Disorder: No Depression: Yes Mania: No Psychosis: Yes Schizophrenia: Yes Personality Disorder: No Hospitalization for psychiatric illness: No History of Electroconvulsive Shock Therapy: No Prior Suicide Attempts: No  Physical Exam: Constitutional:  BP 138/82 mmHg  Pulse 96  Ht 6\' 1"  (1.854 m)  Wt 259 lb 12.8 oz (117.845 kg)  BMI 34.28 kg/m2  General Appearance: well nourished and casually dressed.  He maintained fair eye contact.  No results found for this or any previous visit (from the past 2160 hour(s)). Musculoskeletal: Strength & Muscle Tone: within normal limits Gait & Station: normal Patient leans: The patient has a normal posture  Psychiatric: Speech (describe rate, volume, coherence, spontaneity, and abnormalities if any): Soft and coherent at a regular rate and rhythm.  Thought Process (describe rate, content, abstract reasoning, and computation): Within normal limits  Associations: Intact, fund of knowledge is fair.  Thoughts: patient has some thought blocking.,  Mental Status: Orientation: oriented to person, place, time/date and situation Mood & Affect: flat affect Attention Span & Concentration: Intact  Established Problem, Stable/Improving (1), Review or order clinical lab tests (1), Review of Last Therapy Session (1) and Review of Medication Regimen & Side Effects (2)  Assessment: Axis I: Schizoaffective  Axis II: Defer  Axis III: Noncontributory  Axis IV: Mild to moderate  Axis V: 65   Plan:  I discuss his weight which has been  increased gradually.  I recommended to start doing regular exercise and watching his calorie intake.  I will also order CBC, CMP, albumin A1c and  metabolic panel.  Continue Zyprexa 5 mg at bedtime and Cogentin 1 mg at bedtime.  He is also taking trazodone for insomnia which is working very well for him.  Patient is not interested in counseling.  Recommended to call us back if he has any question or any concern.  Follow-up in 3 months.  Kenneth Welch T., MD 10/13/2014

## 2015-01-09 LAB — CBC WITH DIFFERENTIAL/PLATELET
Basophils Absolute: 0 10*3/uL (ref 0.0–0.1)
Basophils Relative: 0 % (ref 0–1)
Eosinophils Absolute: 0.2 10*3/uL (ref 0.0–0.7)
Eosinophils Relative: 2 % (ref 0–5)
HCT: 42.1 % (ref 39.0–52.0)
Hemoglobin: 14.2 g/dL (ref 13.0–17.0)
Lymphocytes Relative: 36 % (ref 12–46)
Lymphs Abs: 3.2 10*3/uL (ref 0.7–4.0)
MCH: 29.8 pg (ref 26.0–34.0)
MCHC: 33.7 g/dL (ref 30.0–36.0)
MCV: 88.3 fL (ref 78.0–100.0)
Monocytes Absolute: 0.4 10*3/uL (ref 0.1–1.0)
Monocytes Relative: 5 % (ref 3–12)
Neutro Abs: 5 10*3/uL (ref 1.7–7.7)
Neutrophils Relative %: 57 % (ref 43–77)
Platelets: 276 10*3/uL (ref 150–400)
RBC: 4.77 MIL/uL (ref 4.22–5.81)
RDW: 15.5 % (ref 11.5–15.5)
WBC: 8.8 10*3/uL (ref 4.0–10.5)

## 2015-01-10 LAB — COMPREHENSIVE METABOLIC PANEL
ALT: 16 U/L (ref 0–53)
AST: 16 U/L (ref 0–37)
Albumin: 4.1 g/dL (ref 3.5–5.2)
Alkaline Phosphatase: 76 U/L (ref 39–117)
BUN: 17 mg/dL (ref 6–23)
CO2: 23 mEq/L (ref 19–32)
Calcium: 9.4 mg/dL (ref 8.4–10.5)
Chloride: 105 mEq/L (ref 96–112)
Creat: 0.98 mg/dL (ref 0.50–1.35)
Glucose, Bld: 100 mg/dL — ABNORMAL HIGH (ref 70–99)
Potassium: 3.9 mEq/L (ref 3.5–5.3)
Sodium: 139 mEq/L (ref 135–145)
Total Bilirubin: 0.3 mg/dL (ref 0.2–1.2)
Total Protein: 6.8 g/dL (ref 6.0–8.3)

## 2015-01-10 LAB — LIPID PANEL
Cholesterol: 199 mg/dL (ref 0–200)
HDL: 39 mg/dL — ABNORMAL LOW (ref >39)
LDL Cholesterol: 139 mg/dL — ABNORMAL HIGH (ref 0–99)
Total CHOL/HDL Ratio: 5.1 Ratio
Triglycerides: 103 mg/dL (ref <150)
VLDL: 21 mg/dL (ref 0–40)

## 2015-01-10 LAB — HEMOGLOBIN A1C
Hgb A1c MFr Bld: 5.9 % — ABNORMAL HIGH (ref <5.7)
Mean Plasma Glucose: 123 mg/dL — ABNORMAL HIGH (ref <117)

## 2015-01-10 LAB — TSH: TSH: 2.072 u[IU]/mL (ref 0.350–4.500)

## 2015-01-13 ENCOUNTER — Encounter (HOSPITAL_COMMUNITY): Payer: Self-pay | Admitting: Psychiatry

## 2015-01-13 ENCOUNTER — Ambulatory Visit (INDEPENDENT_AMBULATORY_CARE_PROVIDER_SITE_OTHER): Payer: Medicaid Other | Admitting: Psychiatry

## 2015-01-13 VITALS — BP 144/84 | HR 93 | Ht 73.0 in | Wt 263.0 lb

## 2015-01-13 DIAGNOSIS — F259 Schizoaffective disorder, unspecified: Secondary | ICD-10-CM

## 2015-01-13 MED ORDER — TRAZODONE HCL 50 MG PO TABS: 50.0000 mg | ORAL_TABLET | Freq: Every day | ORAL | Status: DC

## 2015-01-13 MED ORDER — OLANZAPINE 5 MG PO TABS: ORAL_TABLET | ORAL | Status: DC

## 2015-01-13 MED ORDER — BENZTROPINE MESYLATE 1 MG PO TABS
ORAL_TABLET | ORAL | Status: DC
Start: 2015-01-13 — End: 2015-04-13

## 2015-01-13 NOTE — Progress Notes (Signed)
[place] [id number] Progress Note  Kenneth Welch [id number] 29 y.o.  07/17/2013 2:34 PM  Chief Complaint:  Medication management and Followup.      History of Present Illness:  Kenneth Welch came for his followup appointment.  He is taking his medication and denies any side effects.  He had a blood work 2 days ago and his CBC, chemistry, hemoglobin A1c and lipid panel was normal.  He continues to struggle to lose his weight and he admitted not doing exercise are watching his calorie intake.  He had a good Christmas .  Patient denies any paranoia, hallucination or any agitation or anger.  He wants to continue current medication.  Patient has limited socialization .  Patient denies drinking or using any illegal substances.  Patient lives with his parents.    Suicidal Ideation: No Plan Formed: No Patient has means to carry out plan: No  Homicidal Ideation: No Plan Formed: No Patient has means to carry out plan: No  Review of Systems: Psychiatric: Agitation: No Hallucination: No Depressed Mood: No Insomnia: No Hypersomnia: No Altered Concentration: No Feels Worthless: No Grandiose Ideas: No Belief In Special Powers: No New/Increased Substance Abuse: No Compulsions: No  Neurologic: Headache: No Seizure: No Paresthesias: No   Outpatient Encounter Prescriptions as of 01/13/2015  Medication Sig  . benztropine (COGENTIN) 1 MG tablet TAKE 1 TABLET AT BED TIME  . OLANZapine (ZYPREXA) 5 MG tablet TAKE 1 TABLET AT BEDTIME  . Spacer/Aero-Holding Chambers (AEROCHAMBER PLUS FLO-VU LARGE) MISC 1 each by Other route once.  . traZODone (DESYREL) 50 MG tablet Take 1 tablet (50 mg total) by mouth at bedtime.  . [DISCONTINUED] benztropine (COGENTIN) 1 MG tablet TAKE 1 TABLET AT BED TIME  . [DISCONTINUED] OLANZapine (ZYPREXA) 5 MG tablet TAKE 1 TABLET AT BEDTIME  . [DISCONTINUED] traZODone (DESYREL) 50 MG tablet Take 1 tablet (50 mg total) by mouth at bedtime.    Past  Psychiatric History/Hospitalization(s): Patient has multiple psychiatric hospitalization because of paranoia, hallucination, psychosis and severe depression.  In the past he has taken Abilify . Anxiety: Yes Bipolar Disorder: No Depression: Yes Mania: No Psychosis: Yes Schizophrenia: Yes Personality Disorder: No Hospitalization for psychiatric illness: No History of Electroconvulsive Shock Therapy: No Prior Suicide Attempts: No  Physical Exam: Constitutional:  BP 144/84 mmHg  Pulse 93  Ht _0  (1.854 m)  Wt 263 lb (119.296 kg)  BMI 34.71 kg/m2  General Appearance: well nourished and casually dressed.  He maintained fair eye contact.  Recent Results (from the past 2160 hour(s))  Hemoglobin A1c     Status: Abnormal   Collection Time: 01/09/15  1:22 PM  Result Value Ref Range   Hgb A1c MFr Bld 5.9 (H) <5.7 %    Comment:                                                                        According to the ADA Clinical Practice Recommendations for 2011, when HbA1c is used as a screening test:     >=6.5%   Diagnostic of Diabetes Mellitus            (if abnormal result is confirmed)   5.7-6.4%   Increased risk of developing Diabetes Mellitus  References:Diagnosis and Classification of Diabetes Mellitus,Diabetes JGGE,3662,94(TMLYY 1):S62-S69 and Standards of Medical Care in         Diabetes - 2011,Diabetes TKPT,4656,81 (Suppl 1):S11-S61.      Mean Plasma Glucose 123 (H) <117 mg/dL  Lipid panel     Status: Abnormal   Collection Time: 01/09/15  1:22 PM  Result Value Ref Range   Cholesterol 199 0 - 200 mg/dL    Comment: ATP III Classification:       < 200        mg/dL        Desirable      200 - 239     mg/dL        Borderline High      >= 240        mg/dL        High      Triglycerides 103 <150 mg/dL   HDL 39 (L) >39 mg/dL   Total CHOL/HDL Ratio 5.1 Ratio   VLDL 21 0 - 40 mg/dL   LDL Cholesterol 139 (H) 0 - 99 mg/dL    Comment:   Total Cholesterol/HDL Ratio:CHD  Risk                        Coronary Heart Disease Risk Table                                        Men       Women          1/2 Average Risk              3.4        3.3              Average Risk              5.0        4.4           2X Average Risk              9.6        7.1           3X Average Risk             23.4       11.0 Use the calculated Patient Ratio above and the CHD Risk table  to determine the patient's CHD Risk. ATP III Classification (LDL):       < 100        mg/dL         Optimal      100 - 129     mg/dL         Near or Above Optimal      130 - 159     mg/dL         Borderline High      160 - 189     mg/dL         High       > 190        mg/dL         Very High     CBC with Differential     Status: None   Collection Time: 01/09/15  1:22 PM  Result Value Ref Range   WBC 8.8 4.0 - 10.5 K/uL   RBC 4.77 4.22 - 5.81 MIL/uL   Hemoglobin  14.2 13.0 - 17.0 g/dL   HCT 42.1 39.0 - 52.0 %   MCV 88.3 78.0 - 100.0 fL   MCH 29.8 26.0 - 34.0 pg   MCHC 33.7 30.0 - 36.0 g/dL   RDW 15.5 11.5 - 15.5 %   Platelets 276 150 - 400 K/uL   Neutrophils Relative % 57 43 - 77 %   Neutro Abs 5.0 1.7 - 7.7 K/uL   Lymphocytes Relative 36 12 - 46 %   Lymphs Abs 3.2 0.7 - 4.0 K/uL   Monocytes Relative 5 3 - 12 %   Monocytes Absolute 0.4 0.1 - 1.0 K/uL   Eosinophils Relative 2 0 - 5 %   Eosinophils Absolute 0.2 0.0 - 0.7 K/uL   Basophils Relative 0 0 - 1 %   Basophils Absolute 0.0 0.0 - 0.1 K/uL   Smear Review Criteria for review not met   Comprehensive metabolic panel     Status: Abnormal   Collection Time: 01/09/15  1:22 PM  Result Value Ref Range   Sodium 139 135 - 145 mEq/L   Potassium 3.9 3.5 - 5.3 mEq/L   Chloride 105 96 - 112 mEq/L   CO2 23 19 - 32 mEq/L   Glucose, Bld 100 (H) 70 - 99 mg/dL   BUN 17 6 - 23 mg/dL   Creat 0.98 0.50 - 1.35 mg/dL   Total Bilirubin 0.3 0.2 - 1.2 mg/dL   Alkaline Phosphatase 76 39 - 117 U/L   AST 16 0 - 37 U/L   ALT 16 0 - 53 U/L   Total  Protein 6.8 6.0 - 8.3 g/dL   Albumin 4.1 3.5 - 5.2 g/dL   Calcium 9.4 8.4 - 10.5 mg/dL  TSH     Status: None   Collection Time: 01/09/15  1:22 PM  Result Value Ref Range   TSH 2.072 0.350 - 4.500 uIU/mL   Musculoskeletal: Strength & Muscle Tone: within normal limits Gait & Station: normal Patient leans: The patient has a normal posture  Psychiatric: Speech (describe rate, volume, coherence, spontaneity, and abnormalities if any): Soft and coherent at a regular rate and rhythm.  Thought Process (describe rate, content, abstract reasoning, and computation): Within normal limits  Associations: Intact, fund of knowledge is fair.  Thoughts: No paranoia however slow thought process.,  Mental Status: Orientation: oriented to person, place, time/date and situation Mood & Affect: flat affect Attention Span & Concentration: Intact  Established Problem, Stable/Improving (1), Review or order clinical lab tests (1), Review of Last Therapy Session (1) and Review of Medication Regimen & Side Effects (2)  Assessment: Axis I: Schizoaffective  Axis II: Defer  Axis III: Noncontributory  Plan:  I review his blood work results and his current medication.  He is stable on his medication however I discuss his weight which has been gradually increased.  I strongly encouraged to watch his calorie intake and to regular exercise.  Continue Zyprexa 5 mg at bedtime, Cogentin 1 mg at bedtime and trazodone 50 mg at bedtime.  Patient is not interested in counseling.  Recommended to call us back if he has any question or any concern.  Follow-up in 3 months.  Obadiah Dennard T., MD 01/13/2015

## 2015-04-13 ENCOUNTER — Ambulatory Visit (INDEPENDENT_AMBULATORY_CARE_PROVIDER_SITE_OTHER): Payer: Medicaid Other | Admitting: Psychiatry

## 2015-04-13 ENCOUNTER — Encounter (HOSPITAL_COMMUNITY): Payer: Self-pay | Admitting: Psychiatry

## 2015-04-13 VITALS — BP 124/83 | HR 88 | Ht 73.0 in | Wt 260.2 lb

## 2015-04-13 DIAGNOSIS — F251 Schizoaffective disorder, depressive type: Secondary | ICD-10-CM

## 2015-04-13 DIAGNOSIS — G4700 Insomnia, unspecified: Secondary | ICD-10-CM

## 2015-04-13 DIAGNOSIS — F259 Schizoaffective disorder, unspecified: Secondary | ICD-10-CM

## 2015-04-13 MED ORDER — BENZTROPINE MESYLATE 1 MG PO TABS: ORAL_TABLET | ORAL | Status: DC

## 2015-04-13 MED ORDER — TRAZODONE HCL 50 MG PO TABS: 50.0000 mg | ORAL_TABLET | Freq: Every day | ORAL | Status: DC

## 2015-04-13 MED ORDER — OLANZAPINE 5 MG PO TABS: ORAL_TABLET | ORAL | Status: DC

## 2015-04-13 NOTE — Progress Notes (Signed)
[facility] [id number] [facility]  Kenneth Welch [id number] 29 y.o.  07/17/2013 2:34 PM  Chief Complaint:  Medication management and Followup.      History of Present Illness:  Kenneth Welch came for his followup appointment.  He is taking trazodone and Zyprexa as prescribed.  He denies any side effects including any tremors or shakes.  He feels medicine is helping him a lot and he has known recent paranoia or any agitation.  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  He is trying to lose weight but also admitted that watching his calorie intake.  He started exercise and walking 15 minutes to 3 times per week.  Patient denies drinking or using any illegal substances.  He is still has limited socialization but he denies any major paranoia or any aggressive behavior.  His appetite is okay.  His vitals are stable.  Patient lives with his parents.  Suicidal Ideation: No Plan Formed: No Patient has means to carry out plan: No  Homicidal Ideation: No Plan Formed: No Patient has means to carry out plan: No  Review of Systems  Constitutional: Negative.   Skin: Negative.   Neurological: Negative for tremors and headaches.  Psychiatric/Behavioral: Negative for suicidal ideas, hallucinations and substance abuse. The patient is not nervous/anxious.     Psychiatric: Agitation: No Hallucination: No Depressed Mood: No Insomnia: No Hypersomnia: No Altered Concentration: No Feels Worthless: No Grandiose Ideas: No Belief In Special Powers: No New/Increased Substance Abuse: No Compulsions: No  Neurologic: Headache: No Seizure: No Paresthesias: No   Outpatient Encounter Prescriptions as of 04/13/2015  Medication Sig  . benztropine (COGENTIN) 1 MG tablet TAKE 1 TABLET AT BED TIME  . OLANZapine (ZYPREXA) 5 MG tablet TAKE 1 TABLET AT BEDTIME  . Spacer/Aero-Holding Chambers (AEROCHAMBER PLUS FLO-VU LARGE) MISC 1  each by Other route once.  . traZODone (DESYREL) 50 MG tablet Take 1 tablet (50 mg total) by mouth at bedtime.  . [DISCONTINUED] benztropine (COGENTIN) 1 MG tablet TAKE 1 TABLET AT BED TIME  . [DISCONTINUED] OLANZapine (ZYPREXA) 5 MG tablet TAKE 1 TABLET AT BEDTIME  . [DISCONTINUED] traZODone (DESYREL) 50 MG tablet Take 1 tablet (50 mg total) by mouth at bedtime.   No facility-administered encounter medications on file as of 04/13/2015.    Past Psychiatric History/Hospitalization(s): Patient has multiple psychiatric hospitalization because of paranoia, hallucination, psychosis and severe depression.  In the past he has taken Abilify . Anxiety: Yes Bipolar Disorder: No Depression: Yes Mania: No Psychosis: Yes Schizophrenia: Yes Personality Disorder: No Hospitalization for psychiatric illness: No History of Electroconvulsive Shock Therapy: No Prior Suicide Attempts: No  Physical Exam: Constitutional:  BP 124/83 mmHg  Pulse 88  Ht 6\' 1"  (1.854 m)  Wt 260 lb 3.2 oz (118.026 kg)  BMI 34.34 kg/m2  General Appearance: well nourished and casually dressed.  He maintained fair eye contact.  No results found for this or any previous visit (from the past 2160 hour(s)). Musculoskeletal: Strength & Muscle Tone: within normal limits Gait & Station: normal Patient leans: The patient has a normal posture  Psychiatric: Speech (describe rate, volume, coherence, spontaneity, and abnormalities if any): Soft and coherent at a regular rate and rhythm.  Thought Process (describe rate, content, abstract reasoning, and computation): Within normal limits  Associations: Intact, fund of knowledge is fair.  Thoughts: No paranoia however slow thought process.,  Mental Status: Orientation: oriented to person, place,  time/date and situation Mood & Affect: flat affect Attention Span & Concentration: Intact  Established Problem, Stable/Improving (1), Review or order clinical lab tests (1), Review of  Last Therapy Session (1) and Review of Medication Regimen & Side Effects (2)  Assessment: Axis I: Schizoaffective disorder , depressed type .  Insomnia.    Axis II: Deferred   Axis III: Noncontributory  Plan:  Patient is doing better on his current medication.  His sleep and paranoia is a stable on his medication.  He has no tremors or shakes.  Encouraged to watch his calorie intake and to regular exercise.  I will continue Zyprexa 5 mg at bedtime, Cogentin 1 mg at bedtime and trazodone 50 mg at bedtime.  Encouraged to participate in socialization, patient is also looking for a part-time job.  Recommended to call us back if he has any question or any concern.  Follow-up in 3 months.  ARFEEN,SYED T., MD 04/13/2015

## 2015-04-30 ENCOUNTER — Other Ambulatory Visit (HOSPITAL_COMMUNITY): Payer: Self-pay | Admitting: Psychiatry

## 2015-05-06 ENCOUNTER — Other Ambulatory Visit (HOSPITAL_COMMUNITY): Payer: Self-pay | Admitting: Psychiatry

## 2015-07-14 ENCOUNTER — Encounter (HOSPITAL_COMMUNITY): Payer: Self-pay | Admitting: Psychiatry

## 2015-07-14 ENCOUNTER — Ambulatory Visit (INDEPENDENT_AMBULATORY_CARE_PROVIDER_SITE_OTHER): Payer: Medicaid Other | Admitting: Psychiatry

## 2015-07-14 VITALS — BP 142/72 | HR 84 | Ht 73.0 in | Wt 251.2 lb

## 2015-07-14 DIAGNOSIS — F251 Schizoaffective disorder, depressive type: Secondary | ICD-10-CM

## 2015-07-14 DIAGNOSIS — G4700 Insomnia, unspecified: Secondary | ICD-10-CM

## 2015-07-14 DIAGNOSIS — F259 Schizoaffective disorder, unspecified: Secondary | ICD-10-CM

## 2015-07-14 MED ORDER — TRAZODONE HCL 50 MG PO TABS: 50.0000 mg | ORAL_TABLET | Freq: Every day | ORAL | Status: DC

## 2015-07-14 MED ORDER — BENZTROPINE MESYLATE 1 MG PO TABS: ORAL_TABLET | ORAL | Status: DC

## 2015-07-14 MED ORDER — OLANZAPINE 5 MG PO TABS
ORAL_TABLET | ORAL | Status: DC
Start: 2015-07-14 — End: 2015-10-14

## 2015-07-14 NOTE — Progress Notes (Signed)
[facility] [id number] Progress Note  Kenneth Welch [id number] 29 y.o.   Chief Complaint:  Medication management and Followup.      History of Present Illness:  Kenneth Welch came for his followup appointment.  He is working part-time at [employer] and he enjoys his job.  His working as a Arboriculturist .  He is taking his medication as prescribed.  He denies any paranoia or any hallucination.  He is still have limited social network however his energy level is good.  He is hoping to continue his job in the future.  Patient denies any irritability, anger, mood swing.  His appetite is okay.  His vital signs are okay.  He sleeps all night.  He likes trazodone resulting in sleep.  He denies any aggressive behavior or any anger issues.  He is able to lost weight from his last visit.  Patient lives with his parents were very supportive.  Suicidal Ideation: No Plan Formed: No Patient has means to carry out plan: No  Homicidal Ideation: No Plan Formed: No Patient has means to carry out plan: No  Review of Systems  Constitutional: Negative.   Skin: Negative.   Neurological: Negative for tremors and headaches.  Psychiatric/Behavioral: Negative for suicidal ideas, hallucinations and substance abuse. The patient is not nervous/anxious.     Psychiatric: Agitation: No Hallucination: No Depressed Mood: No Insomnia: No Hypersomnia: No Altered Concentration: No Feels Worthless: No Grandiose Ideas: No Belief In Special Powers: No New/Increased Substance Abuse: No Compulsions: No  Neurologic: Headache: No Seizure: No Paresthesias: No   Outpatient Encounter Prescriptions as of 07/14/2015  Medication Sig  . benztropine (COGENTIN) 1 MG tablet TAKE 1 TABLET AT BED TIME  . OLANZapine (ZYPREXA) 5 MG tablet TAKE 1 TABLET AT BEDTIME  . traZODone (DESYREL) 50 MG tablet Take 1 tablet (50 mg total) by mouth at bedtime.  . [DISCONTINUED] benztropine (COGENTIN) 1 MG tablet TAKE 1 TABLET AT BED  TIME  . [DISCONTINUED] OLANZapine (ZYPREXA) 5 MG tablet TAKE 1 TABLET AT BEDTIME  . [DISCONTINUED] Spacer/Aero-Holding Chambers (AEROCHAMBER PLUS FLO-VU LARGE) MISC 1 each by Other route once.  . [DISCONTINUED] traZODone (DESYREL) 50 MG tablet Take 1 tablet (50 mg total) by mouth at bedtime.   No facility-administered encounter medications on file as of 07/14/2015.    Past Psychiatric History/Hospitalization(s): Patient has multiple psychiatric hospitalization because of paranoia, hallucination, psychosis and severe depression.  In the past he has taken Abilify . Anxiety: Yes Bipolar Disorder: No Depression: Yes Mania: No Psychosis: Yes Schizophrenia: Yes Personality Disorder: No Hospitalization for psychiatric illness: No History of Electroconvulsive Shock Therapy: No Prior Suicide Attempts: No  Physical Exam: Constitutional:  BP 142/72 mmHg  Pulse 84  Ht 6\' 1"  (1.854 m)  Wt 251 lb 3.2 oz (113.944 kg)  BMI 33.15 kg/m2  General Appearance: well nourished and casually dressed.  He maintained fair eye contact.  No results found for this or any previous visit (from the past 2160 hour(s)). Musculoskeletal: Strength & Muscle Tone: within normal limits Gait & Station: normal Patient leans: The patient has a normal posture  Psychiatric: Speech (describe rate, volume, coherence, spontaneity, and abnormalities if any): Soft and coherent at a regular rate and rhythm.  Thought Process (describe rate, content, abstract reasoning, and computation): Within normal limits  Associations: Intact, fund of knowledge is fair.  Thoughts: No paranoia however slow thought process.,  Mental Status: Orientation: oriented to person, place, time/date and situation Mood & Affect: flat affect Attention Span &  Concentration: Intact  Established Problem, Stable/Improving (1), Review or order clinical lab tests (1), Review of Last Therapy Session (1) and Review of Medication Regimen & Side Effects  (2)  Assessment: Axis I: Schizoaffective disorder , depressed type .  Insomnia.    Axis II: Deferred   Axis III: Noncontributory  Plan:  Patient is stable on his current medication.  He has no tremors or shakes.  He is working part-time at [employer] but hoping to continue his job after summer.  I will continue Zyprexa 5 mg at bedtime, Cogentin 1 mg at bedtime and trazodone 50 mg at bedtime.  Encouraged to participate in socialization.  Recommended to call us back if he has any question or any concern.  Follow-up in 3 months.  Kenneth Mundo T., MD 07/14/2015

## 2015-10-14 ENCOUNTER — Ambulatory Visit (INDEPENDENT_AMBULATORY_CARE_PROVIDER_SITE_OTHER): Payer: Medicaid Other | Admitting: Psychiatry

## 2015-10-14 ENCOUNTER — Encounter (HOSPITAL_COMMUNITY): Payer: Self-pay | Admitting: Psychiatry

## 2015-10-14 ENCOUNTER — Other Ambulatory Visit (HOSPITAL_COMMUNITY): Payer: Self-pay | Admitting: Psychiatry

## 2015-10-14 VITALS — BP 128/87 | HR 80 | Wt 255.0 lb

## 2015-10-14 DIAGNOSIS — F259 Schizoaffective disorder, unspecified: Secondary | ICD-10-CM | POA: Diagnosis not present

## 2015-10-14 DIAGNOSIS — G4700 Insomnia, unspecified: Secondary | ICD-10-CM | POA: Diagnosis not present

## 2015-10-14 MED ORDER — OLANZAPINE 5 MG PO TABS: ORAL_TABLET | ORAL | Status: DC

## 2015-10-14 MED ORDER — BENZTROPINE MESYLATE 1 MG PO TABS: ORAL_TABLET | ORAL | Status: DC
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
## 2015-10-15 NOTE — Telephone Encounter (Signed)
Received medication request from [facility] for Trazodone 50mg . Per Dr. Lolly MustacheArfeen, medication request is denied. Pt informed Dr. Lolly MustacheArfeen on 10/14/15 he has medication left over from pervious prescription and no new prescriptions will be issues at this time. Pt will contact the office when a new prescription is need. Pt has a f/u appt on 01/13/16.

## 2016-01-14 ENCOUNTER — Encounter (HOSPITAL_COMMUNITY): Payer: Self-pay | Admitting: Psychiatry

## 2016-01-14 ENCOUNTER — Ambulatory Visit (INDEPENDENT_AMBULATORY_CARE_PROVIDER_SITE_OTHER): Payer: Medicaid Other | Admitting: Psychiatry

## 2016-01-14 VITALS — BP 128/84 | HR 88 | Ht 74.25 in | Wt 255.4 lb

## 2016-01-14 DIAGNOSIS — G4700 Insomnia, unspecified: Secondary | ICD-10-CM

## 2016-01-14 DIAGNOSIS — F259 Schizoaffective disorder, unspecified: Secondary | ICD-10-CM | POA: Diagnosis not present

## 2016-01-14 DIAGNOSIS — Z79899 Other long term (current) drug therapy: Secondary | ICD-10-CM | POA: Diagnosis not present

## 2016-01-14 MED ORDER — OLANZAPINE 5 MG PO TABS: ORAL_TABLET | ORAL | Status: DC

## 2016-01-14 MED ORDER — BENZTROPINE MESYLATE 1 MG PO TABS: ORAL_TABLET | ORAL | Status: DC

## 2016-01-14 NOTE — Progress Notes (Signed)
[facility] [address] Progress Note  Kenneth Welch [id number] 30 y.o.   Chief Complaint:  Medication management and Followup.      History of Present Illness:  Kenneth Welch came for his followup appointment.  He is taking his medication as prescribed.  He had a good Christmas.  He is happy as he is able to get 10 hours a week job at [employer]. 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  He denies any irritability, anger, paranoia or any hallucination.  He sleeps good and rarely takes trazodone for insomnia.  He has no tremors shakes or any EPS.  He denies any mania or any psychosis.  He still have limited social network and he wants to get a permanent job to keep himself busy.  Patient denies drinking or using any illegal substances.  His appetite is okay.  His vitals are stable.  Patient lives with his parents and his older sister.  Suicidal Ideation: No Plan Formed: No Patient has means to carry out plan: No  Homicidal Ideation: No Plan Formed: No Patient has means to carry out plan: No  Review of Systems  Constitutional: Negative.  Negative for weight loss and malaise/fatigue.  Cardiovascular: Negative for chest pain and palpitations.  Skin: Negative.   Neurological: Negative for dizziness, tremors and headaches.  Psychiatric/Behavioral: Negative for suicidal ideas, hallucinations and substance abuse. The patient has insomnia. The patient is not nervous/anxious.     Psychiatric: Agitation: No Hallucination: No Depressed Mood: No Insomnia: No Hypersomnia: No Altered Concentration: No Feels Worthless: No Grandiose Ideas: No Belief In Special Powers: No New/Increased Substance Abuse: No Compulsions: No  Neurologic: Headache: No Seizure: No Paresthesias: No   Outpatient Encounter Prescriptions as of 01/14/2016  Medication Sig  . benztropine (COGENTIN) 1 MG tablet TAKE 1 TABLET AT BED TIME  . OLANZapine (ZYPREXA) 5 MG tablet TAKE 1 TABLET AT BEDTIME  .  traZODone (DESYREL) 50 MG tablet Take 1 tablet (50 mg total) by mouth at bedtime.  . [DISCONTINUED] benztropine (COGENTIN) 1 MG tablet TAKE 1 TABLET AT BED TIME  . [DISCONTINUED] OLANZapine (ZYPREXA) 5 MG tablet TAKE 1 TABLET AT BEDTIME   No facility-administered encounter medications on file as of 01/14/2016.    Past Psychiatric History/Hospitalization(s): Patient has multiple psychiatric hospitalization because of paranoia, hallucination, psychosis and severe depression.  In the past he has taken Abilify . Anxiety: Yes Bipolar Disorder: No Depression: Yes Mania: No Psychosis: Yes Schizophrenia: Yes Personality Disorder: No Hospitalization for psychiatric illness: No History of Electroconvulsive Shock Therapy: No Prior Suicide Attempts: No  Physical Exam: Constitutional:  BP 128/84 mmHg  Pulse 88  Ht 6' 2.25" (1.886 m)  Wt 255 lb 6.4 oz (115.849 kg)  BMI 32.57 kg/m2  General Appearance: well nourished and casually dressed.  He maintained fair eye contact.  No results found for this or any previous visit (from the past 2160 hour(s)). Musculoskeletal: Strength & Muscle Tone: within normal limits Gait & Station: normal Patient leans: The patient has a normal posture  Mental status examination: Patient is casually dressed and fairly groomed.  He maintained fair eye contact.  His speech is nonspontaneous .  His thought process slow but logical.  He described his mood euthymic and his affect is flat.  He denies any auditory or visual hallucination.  His attention and concentration is fair.  He denies any active or passive suicidal thoughts or homicidal thought.  There were no flight of ideas or any loose association.  There were no delusions, paranoia or any obsessive thoughts.  His psychomotor activity is slow.  His fund around is average.  He has no tremors, shakes or any EPS.  He is alert and oriented 3.  His insight judgment and impulse control is okay.   Established Problem,  Stable/Improving (1), Review of Last Therapy Session (1) and Review of Medication Regimen & Side Effects (2)  Assessment: Axis I: Schizoaffective disorder , depressed type .  Insomnia.    Axis II: Deferred   Axis III: Noncontributory  Plan:  patient is a stable on her current psychiatric medication.  I will continue Zyprexa 5 mg at bedtime and Cogentin 1 mg at bedtime.  Patient does not need a new prescription of trazodone at this time.  Recommended to call us back if he has any question or any concern.  Follow-up in 3 months.  Jhada Risk T., MD 01/14/2016

## 2016-03-29 LAB — COMPLETE METABOLIC PANEL WITH GFR
ALT: 12 U/L (ref 9–46)
AST: 16 U/L (ref 10–40)
Albumin: 4.9 g/dL (ref 3.6–5.1)
Alkaline Phosphatase: 62 U/L (ref 40–115)
BUN: 15 mg/dL (ref 7–25)
CO2: 25 mmol/L (ref 20–31)
Calcium: 9.4 mg/dL (ref 8.6–10.3)
Chloride: 103 mmol/L (ref 98–110)
Creat: 1.06 mg/dL (ref 0.60–1.35)
GFR, Est African American: >89 mL/min (ref >=60)
GFR, Est Non African American: >89 mL/min (ref >=60)
Glucose, Bld: 86 mg/dL (ref 65–99)
Potassium: 4.2 mmol/L (ref 3.5–5.3)
Sodium: 139 mmol/L (ref 135–146)
Total Bilirubin: 0.6 mg/dL (ref 0.2–1.2)
Total Protein: 7.1 g/dL (ref 6.1–8.1)

## 2016-03-29 LAB — CBC WITH DIFFERENTIAL/PLATELET
Basophils Absolute: 0 cells/uL (ref 0–200)
Basophils Relative: 0 %
Eosinophils Absolute: 0 cells/uL — ABNORMAL LOW (ref 15–500)
Eosinophils Relative: 0 %
HCT: 44 % (ref 38.5–50.0)
Hemoglobin: 14.8 g/dL (ref 13.2–17.1)
Lymphocytes Relative: 24 %
Lymphs Abs: 2136 cells/uL (ref 850–3900)
MCH: 30.1 pg (ref 27.0–33.0)
MCHC: 33.6 g/dL (ref 32.0–36.0)
MCV: 89.4 fL (ref 80.0–100.0)
MPV: 11.1 fL (ref 7.5–12.5)
Monocytes Absolute: 445 cells/uL (ref 200–950)
Monocytes Relative: 5 %
Neutro Abs: 6319 cells/uL (ref 1500–7800)
Neutrophils Relative %: 71 %
Platelets: 277 10*3/uL (ref 140–400)
RBC: 4.92 MIL/uL (ref 4.20–5.80)
RDW: 15 % (ref 11.0–15.0)
WBC: 8.9 10*3/uL (ref 3.8–10.8)

## 2016-03-29 LAB — HEMOGLOBIN A1C
Hgb A1c MFr Bld: 5.8 % — ABNORMAL HIGH (ref <5.7)
Mean Plasma Glucose: 120 mg/dL

## 2016-03-29 LAB — TSH: TSH: 2.7 mIU/L (ref 0.40–4.50)

## 2016-04-12 ENCOUNTER — Encounter (HOSPITAL_COMMUNITY): Payer: Self-pay | Admitting: Psychiatry

## 2016-04-12 ENCOUNTER — Ambulatory Visit (INDEPENDENT_AMBULATORY_CARE_PROVIDER_SITE_OTHER): Payer: Medicaid Other | Admitting: Psychiatry

## 2016-04-12 VITALS — BP 136/98 | HR 73 | Ht 73.0 in | Wt 250.2 lb

## 2016-04-12 DIAGNOSIS — G4700 Insomnia, unspecified: Secondary | ICD-10-CM | POA: Diagnosis not present

## 2016-04-12 DIAGNOSIS — F259 Schizoaffective disorder, unspecified: Secondary | ICD-10-CM

## 2016-04-12 MED ORDER — BENZTROPINE MESYLATE 1 MG PO TABS: ORAL_TABLET | ORAL | Status: DC

## 2016-04-12 MED ORDER — OLANZAPINE 5 MG PO TABS: ORAL_TABLET | ORAL | Status: DC

## 2016-04-12 NOTE — Progress Notes (Signed)
[facility] [address] Progress Note  Kenneth Welch [id number] 30 y.o.   Chief Complaint:  Medication management and Followup.      History of Present Illness:  Kenneth Welch came for his followup appointment.  He is taking Zyprexa and Cogentin every night.  He rarely takes trazodone.  He sleeping good.  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  He is hoping to continue his job in the summer he denies any paranoia, hallucination, irritability, anger or any delusions.  Denies any feeling of hopelessness or worthlessness.  He is still has some time mild thought blocking but he has no major impulsive behavior.  He has limited social network.  He is not involved in any self abusive behavior.  His appetite is okay.  His attention and concentration is fair.  He denies drinking or using any illegal substances.  His vitals are stable.  He lives with his parents and his older sister.  Patient has blood work few weeks ago and his CBC, basic chemistry and hemoglobin A1c is normal.  Suicidal Ideation: No Plan Formed: No Patient has means to carry out plan: No  Homicidal Ideation: No Plan Formed: No Patient has means to carry out plan: No  Review of Systems  Constitutional: Negative.  Negative for weight loss and malaise/fatigue.  Cardiovascular: Negative for chest pain and palpitations.  Skin: Negative.   Neurological: Negative for dizziness, tremors and headaches.  Psychiatric/Behavioral: Negative for suicidal ideas, hallucinations and substance abuse. The patient has insomnia. The patient is not nervous/anxious.     Psychiatric: Agitation: No Hallucination: No Depressed Mood: No Insomnia: No Hypersomnia: No Altered Concentration: No Feels Worthless: No Grandiose Ideas: No Belief In Special Powers: No New/Increased Substance Abuse: No Compulsions: No  Neurologic: Headache: No Seizure:  No Paresthesias: No   Outpatient Encounter Prescriptions as of 04/12/2016  Medication Sig  . benztropine (COGENTIN) 1 MG tablet TAKE 1 TABLET AT BED TIME  . Homeopathic Products (MENTAL CLARITY IN) Inhale into the lungs.  . Multiple Vitamin (MULTIVITAMIN WITH MINERALS) TABS tablet Take 1 tablet by mouth daily.  Marland Kitchen OLANZapine (ZYPREXA) 5 MG tablet TAKE 1 TABLET AT BEDTIME  . traZODone (DESYREL) 50 MG tablet Take 1 tablet (50 mg total) by mouth at bedtime.  . [DISCONTINUED] benztropine (COGENTIN) 1 MG tablet TAKE 1 TABLET AT BED TIME  . [DISCONTINUED] OLANZapine (ZYPREXA) 5 MG tablet TAKE 1 TABLET AT BEDTIME   No facility-administered encounter medications on file as of 04/12/2016.    Past Psychiatric History/Hospitalization(s): Patient has multiple psychiatric hospitalization because of paranoia, hallucination, psychosis and severe depression.  In the past he has taken Abilify . Anxiety: Yes Bipolar Disorder: No Depression: Yes Mania: No Psychosis: Yes Schizophrenia: Yes Personality Disorder: No Hospitalization for psychiatric illness: No History of Electroconvulsive Shock Therapy: No Prior Suicide Attempts: No  Physical Exam: Constitutional:  BP 136/98 mmHg  Pulse 73  Ht 6\' 1"  (1.854 m)  Wt 250 lb 3.2 oz (113.49 kg)  BMI 33.02 kg/m2  General Appearance: well nourished and casually dressed.  He maintained fair eye contact.  Recent Results (from the past 2160 hour(s))  TSH     Status: None   Collection Time: 03/28/16  2:08 PM  Result Value Ref Range   TSH 2.70 0.40 - 4.50 mIU/L  CBC with Differential/Platelet     Status: Abnormal   Collection Time: 03/28/16  2:08 PM  Result Value Ref Range   WBC 8.9 3.8 - 10.8 K/uL   RBC 4.92 4.20 - 5.80 MIL/uL   Hemoglobin 14.8 13.2 - 17.1 g/dL   HCT 44.0 38.5 - 50.0 %   MCV 89.4 80.0 - 100.0 fL   MCH 30.1 27.0 - 33.0 pg   MCHC 33.6 32.0 - 36.0 g/dL   RDW 15.0 11.0 - 15.0 %   Platelets 277 140 - 400 K/uL   MPV 11.1 7.5 - 12.5 fL    Neutro Abs 6319 1500 - 7800 cells/uL   Lymphs Abs 2136 850 - 3900 cells/uL   Monocytes Absolute 445 200 - 950 cells/uL   Eosinophils Absolute 0 (L) 15 - 500 cells/uL   Basophils Absolute 0 0 - 200 cells/uL   Neutrophils Relative % 71 %   Lymphocytes Relative 24 %   Monocytes Relative 5 %   Eosinophils Relative 0 %   Basophils Relative 0 %   Smear Review Criteria for review not met     Comment: ** Please note change in unit of measure and reference range(s). **  COMPLETE METABOLIC PANEL WITH GFR     Status: None   Collection Time: 03/28/16  2:08 PM  Result Value Ref Range   Sodium 139 135 - 146 mmol/L   Potassium 4.2 3.5 - 5.3 mmol/L   Chloride 103 98 - 110 mmol/L   CO2 25 20 - 31 mmol/L   Glucose, Bld 86 65 - 99 mg/dL   BUN 15 7 - 25 mg/dL   Creat 1.06 0.60 - 1.35 mg/dL   Total Bilirubin 0.6 0.2 - 1.2 mg/dL   Alkaline Phosphatase 62 40 - 115 U/L   AST 16 10 - 40 U/L   ALT 12 9 - 46 U/L   Total Protein 7.1 6.1 - 8.1 g/dL   Albumin 4.9 3.6 - 5.1 g/dL   Calcium 9.4 8.6 - 10.3 mg/dL   GFR, Est African American >89 >=60 mL/min   GFR, Est Non African American >89 >=60 mL/min    Comment:   The estimated GFR is a calculation valid for adults (>=43 years old) that uses the CKD-EPI algorithm to adjust for age and sex. It is   not to be used for children, pregnant women, hospitalized patients,    patients on dialysis, or with rapidly changing kidney function. According to the NKDEP, eGFR >89 is normal, 60-89 shows mild impairment, 30-59 shows moderate impairment, 15-29 shows severe impairment and <15 is ESRD.     Hemoglobin A1c     Status: Abnormal   Collection Time: 03/28/16  2:08 PM  Result Value Ref Range   Hgb A1c MFr Bld 5.8 (H) <5.7 %    Comment:   For someone without known diabetes, a hemoglobin A1c value between 5.7% and 6.4% is consistent with prediabetes and should be confirmed with a follow-up test.   For someone with known diabetes, a value <7% indicates that  their diabetes is well controlled. A1c targets should be individualized based on duration of diabetes, age, co-morbid conditions and other considerations.   This assay result is consistent with an increased risk of diabetes.   Currently, no consensus exists regarding use of hemoglobin A1c for diagnosis of diabetes in children.      Mean Plasma Glucose 120 mg/dL   Musculoskeletal: Strength & Muscle Tone: within normal limits Gait & Station: normal Patient leans: The patient has a normal posture  Mental status examination: Patient is casually dressed  and fairly groomed.  He maintained fair eye contact.  His speech is nonspontaneous .  His thought process slow but logical.  He described his mood euthymic and his affect is flat.  He denies any auditory or visual hallucination.  His attention and concentration is fair.  He denies any active or passive suicidal thoughts or homicidal thought.  There were no flight of ideas or any loose association.  There were no delusions, paranoia or any obsessive thoughts.  His psychomotor activity is slow.  His fund around is average.  He has no tremors, shakes or any EPS.  He is alert and oriented 3.  His insight judgment and impulse control is okay.   Established Problem, Stable/Improving (1), Review of Psycho-Social Stressors (1), Review or order clinical lab tests (1), Review of Last Therapy Session (1) and Review of Medication Regimen & Side Effects (2)  Assessment: Axis I: Schizoaffective disorder , depressed type .  Insomnia.    Axis II: Deferred   Axis III: Noncontributory  Plan:  Patient is a stable on her current psychiatric medication.  I will continue Zyprexa 5 mg at bedtime and Cogentin 1 mg at bedtime.  Patient does not need a new prescription of trazodone at this time.  Recommended to call us back if he has any question or any concern.  Follow-up in 3 months.  Edgardo Petrenko T., MD 04/12/2016

## 2016-06-20 ENCOUNTER — Other Ambulatory Visit (HOSPITAL_COMMUNITY): Payer: Self-pay | Admitting: Psychiatry

## 2016-07-13 ENCOUNTER — Ambulatory Visit (HOSPITAL_COMMUNITY): Payer: Self-pay | Admitting: Psychiatry

## 2016-07-26 ENCOUNTER — Ambulatory Visit (HOSPITAL_COMMUNITY): Payer: Self-pay | Admitting: Psychiatry

## 2016-08-02 ENCOUNTER — Other Ambulatory Visit (HOSPITAL_COMMUNITY): Payer: Self-pay

## 2016-08-02 DIAGNOSIS — F259 Schizoaffective disorder, unspecified: Secondary | ICD-10-CM

## 2016-08-02 MED ORDER — OLANZAPINE 5 MG PO TABS: ORAL_TABLET | ORAL | 1 refills | Status: DC

## 2016-08-02 MED ORDER — BENZTROPINE MESYLATE 1 MG PO TABS: ORAL_TABLET | ORAL | 1 refills | Status: DC

## 2016-08-02 NOTE — Progress Notes (Signed)
Patient called for refill on medication, he has a follow up in October. Refill is appropriate per protocol and was sent to the pharmacy with a refill to get patient to appointment.

## 2016-09-13 ENCOUNTER — Other Ambulatory Visit (HOSPITAL_COMMUNITY): Payer: Self-pay | Admitting: Psychiatry

## 2016-09-15 ENCOUNTER — Ambulatory Visit (INDEPENDENT_AMBULATORY_CARE_PROVIDER_SITE_OTHER): Payer: Medicaid Other | Admitting: Psychiatry

## 2016-09-15 ENCOUNTER — Encounter (HOSPITAL_COMMUNITY): Payer: Self-pay | Admitting: Psychiatry

## 2016-09-15 DIAGNOSIS — F251 Schizoaffective disorder, depressive type: Secondary | ICD-10-CM | POA: Diagnosis not present

## 2016-09-15 DIAGNOSIS — G4700 Insomnia, unspecified: Secondary | ICD-10-CM

## 2016-09-15 DIAGNOSIS — F259 Schizoaffective disorder, unspecified: Secondary | ICD-10-CM

## 2016-09-15 MED ORDER — OLANZAPINE 5 MG PO TABS: ORAL_TABLET | ORAL | 2 refills | Status: DC

## 2016-09-15 MED ORDER — BENZTROPINE MESYLATE 1 MG PO TABS: ORAL_TABLET | ORAL | 2 refills | Status: DC

## 2016-09-15 NOTE — Progress Notes (Signed)
[facility] [id number] Progress Note  Kenneth Welch [id number]  [birth year] 30 y.o.   Chief Complaint:  Medication management and Followup.      History of Present Illness:  Kenneth Welch came for his followup appointment.  Patient is taking his medication as prescribed.  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  He applied to different places and hoping to get a response.  He likes Zyprexa and Cogentin because it helps his paranoia, irritability, anger and impulsive behavior.  He is getting along with his sister and parents.  His appetite is okay.  Vital signs are stable.  Denies any feeling of hopelessness or worthlessness but he has limited social network.  Sometime he has a mild thought blocking when he has no major psychiatric issues.  He is relevant in conversation.  He denies drinking or using any illegal substances.  He denies any EPS or any other concern from psychiatric medication. He rarely takes trazodone for insomnia.  Suicidal Ideation: No Plan Formed: No Patient has means to carry out plan: No  Homicidal Ideation: No Plan Formed: No Patient has means to carry out plan: No  Review of Systems  Constitutional: Negative.  Negative for malaise/fatigue and weight loss.  Cardiovascular: Negative for chest pain and palpitations.  Skin: Negative.   Neurological: Negative for dizziness, tremors and headaches.  Psychiatric/Behavioral: Negative for hallucinations, substance abuse and suicidal ideas. The patient is not nervous/anxious.     Psychiatric: Agitation: No Hallucination: No Depressed Mood: No Insomnia: No Hypersomnia: No Altered Concentration: No Feels Worthless: No Grandiose Ideas: No Belief In Special Powers: No New/Increased Substance Abuse: No Compulsions: No  Neurologic: Headache: No Seizure: No Paresthesias: No   Outpatient Encounter Prescriptions as of 09/15/2016  Medication Sig Dispense Refill  .  benztropine (COGENTIN) 1 MG tablet TAKE 1 TABLET AT BED TIME 30 tablet 2  . Homeopathic Products (MENTAL CLARITY IN) Inhale into the lungs.    . Multiple Vitamin (MULTIVITAMIN WITH MINERALS) TABS tablet Take 1 tablet by mouth daily.    Marland Kitchen. OLANZapine (ZYPREXA) 5 MG tablet TAKE 1 TABLET AT BEDTIME 30 tablet 2  . traZODone (DESYREL) 50 MG tablet TAKE 1 TABLET (50 MG TOTAL) BY MOUTH AT BEDTIME. 30 tablet 2  . [DISCONTINUED] benztropine (COGENTIN) 1 MG tablet TAKE 1 TABLET AT BED TIME 30 tablet 1  . [DISCONTINUED] OLANZapine (ZYPREXA) 5 MG tablet TAKE 1 TABLET AT BEDTIME 30 tablet 1  . [DISCONTINUED] traZODone (DESYREL) 50 MG tablet TAKE 1 TABLET (50 MG TOTAL) BY MOUTH AT BEDTIME. 30 tablet 2   No facility-administered encounter medications on file as of 09/15/2016.     Past Psychiatric History/Hospitalization(s): Patient has multiple psychiatric hospitalization because of paranoia, hallucination, psychosis and severe depression.  In the past he has taken Abilify . Anxiety: Yes Bipolar Disorder: No Depression: Yes Mania: No Psychosis: Yes Schizophrenia: Yes Personality Disorder: No Hospitalization for psychiatric illness: No History of Electroconvulsive Shock Therapy: No Prior Suicide Attempts: No  Physical Exam: Constitutional:  BP 112/78   Pulse 93   Ht 6\' 1"  (1.854 m)   Wt 250 lb (113.4 kg)   BMI 32.98 kg/m   General Appearance: well nourished and casually dressed.  He maintained fair eye contact.  No results found for this or any previous visit (from the past 2160 hour(s)). Musculoskeletal: Strength & Muscle Tone: within normal limits Gait & Station: normal Patient leans: The patient has a normal posture  Mental status examination: Patient is casually dressed and fairly groomed.  He maintained fair eye contact.  His speech is Slow but clear and coherent.  His thought process slow but logical.  He described his mood euthymic and his affect is flat.  He denies any auditory or  visual hallucination.  His attention and concentration is fair.  He denies any active or passive suicidal thoughts or homicidal thought.  There were no flight of ideas or any loose association.  There were no delusions, paranoia or any obsessive thoughts.  His psychomotor activity is slow.  His fund around is average.  He has no tremors, shakes or any EPS.  He is alert and oriented 3.  His insight judgment and impulse control is okay.   Established Problem, Stable/Improving (1), Review of Psycho-Social Stressors (1), Review or order clinical lab tests (1), Review of Last Therapy Session (1) and Review of Medication Regimen & Side Effects (2)  Assessment: Axis I: Schizoaffective disorder , depressed type .  Insomnia.    Axis II: Deferred   Axis III: Noncontributory  Plan:  Patient is a stable on her current psychiatric medication.  I will continue Zyprexa 5 mg at bedtime and Cogentin 1 mg at bedtime. He takes rarely trazodone for insomnia.  Recommended to call us back if he has any question or any concern.  Follow-up in 3 months.  Shanyce Daris T., MD 09/15/2016                 Patient ID: Kenneth Welch, male   DOB: 1986/04/07, 30 y.o.   MRN: [id number]

## 2016-12-20 ENCOUNTER — Other Ambulatory Visit (HOSPITAL_COMMUNITY): Payer: Self-pay | Admitting: Psychiatry

## 2016-12-20 ENCOUNTER — Ambulatory Visit (INDEPENDENT_AMBULATORY_CARE_PROVIDER_SITE_OTHER): Payer: Medicaid Other | Admitting: Psychiatry

## 2016-12-20 ENCOUNTER — Encounter (HOSPITAL_COMMUNITY): Payer: Self-pay | Admitting: Psychiatry

## 2016-12-20 VITALS — BP 150/98 | HR 100 | Ht 73.0 in | Wt 253.4 lb

## 2016-12-20 DIAGNOSIS — Z79899 Other long term (current) drug therapy: Secondary | ICD-10-CM

## 2016-12-20 DIAGNOSIS — F259 Schizoaffective disorder, unspecified: Secondary | ICD-10-CM

## 2016-12-20 MED ORDER — TRAZODONE HCL 50 MG PO TABS: ORAL_TABLET | ORAL | 0 refills | Status: DC

## 2016-12-20 MED ORDER — BENZTROPINE MESYLATE 0.5 MG PO TABS: 0.5000 mg | ORAL_TABLET | Freq: Every day | ORAL | 2 refills | Status: DC

## 2016-12-20 MED ORDER — OLANZAPINE 5 MG PO TABS: ORAL_TABLET | ORAL | 2 refills | Status: DC

## 2016-12-20 NOTE — Progress Notes (Signed)
BH MD/PA/NP OP Progress Note  12/20/2016 2:36 PM Kenneth Welch  MRN:  [id number]  Chief Complaint:  Subjective:  I am concerned because I did not have a job.  I'm looking everywhere.  HPI: Kenneth Welch came for his follow-up appointment.  He is anxious and concerned about job.  For past few months he has been trying but unable to find a job.  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  His been taking his medication and reported no side effects.  He is taking trazodone more frequently because some nights he has difficulty falling asleep.  He lives with his father and elderly sister.  He denies any irritability, anger, impulsive behavior, paranoia or any hallucination.  Sometimes is complaining about dry mouth and wondering if Cogentin can be reduced.  He has no tremors or shakes.  His appetite is okay.  His vital signs are stable.  Patient denies drinking alcohol or using any illegal substances.  His energy level is good.  enton violent Visit Diagnosis:    ICD-9-CM ICD-10-CM   1. Schizoaffective disorder, unspecified type (HCC) 295.70 F25.9 traZODone (DESYREL) 50 MG tablet     OLANZapine (ZYPREXA) 5 MG tablet     benztropine (COGENTIN) 0.5 MG tablet    Past Psychiatric History: Reviewed.  Past Medical History: No past medical history on file. No past surgical history on file.  Family Psychiatric History: Reviewed  Family History: No family history on file.  Social History:  Social History   Social History  . Marital status: Single    Spouse name: N/A  . Number of children: N/A  . Years of education: N/A   Social History Main Topics  . Smoking status: Never Smoker  . Smokeless tobacco: Not on file  . Alcohol use No  . Drug use: No  . Sexual activity: No   Other Topics Concern  . Not on file   Social History Narrative  . No narrative on file    Allergies: No Known Allergies  Metabolic Disorder Labs: Lab Results  Component Value  Date   HGBA1C 5.8 (H) 03/28/2016   MPG 120 03/28/2016   MPG 123 (H) 01/09/2015   No results found for: PROLACTIN Lab Results  Component Value Date   CHOL 199 01/09/2015   TRIG 103 01/09/2015   HDL 39 (L) 01/09/2015   CHOLHDL 5.1 01/09/2015   VLDL 21 01/09/2015   LDLCALC 139 (H) 01/09/2015   LDLCALC 128 (H) 12/13/2013     Current Medications: Current Outpatient Prescriptions  Medication Sig Dispense Refill  . benztropine (COGENTIN) 1 MG tablet TAKE 1 TABLET AT BED TIME 30 tablet 2  . Homeopathic Products (MENTAL CLARITY IN) Inhale into the lungs.    . Multiple Vitamin (MULTIVITAMIN WITH MINERALS) TABS tablet Take 1 tablet by mouth daily.    Marland Kitchen OLANZapine (ZYPREXA) 5 MG tablet TAKE 1 TABLET AT BEDTIME 30 tablet 2  . traZODone (DESYREL) 50 MG tablet TAKE 1 TABLET (50 MG TOTAL) BY MOUTH AT BEDTIME. 30 tablet 2   No current facility-administered medications for this visit.     Neurologic: Headache: No Seizure: No Paresthesias: No  Musculoskeletal: Strength & Muscle Tone: within normal limits Gait & Station: normal Patient leans: N/A  Psychiatric Specialty Exam: Review of Systems  Constitutional: Negative.   HENT: Negative.   Respiratory: Negative.   Musculoskeletal: Negative.   Skin: Negative.   Neurological: Negative.     Blood pressure Marland Kitchen)  150/98, pulse 100, height 6\' 1"  (1.854 m), weight 253 lb 6.4 oz (114.9 kg).There is no height or weight on file to calculate BMI.  General Appearance: Casual  Eye Contact:  Good  Speech:  Clear and Coherent  Volume:  Normal  Mood:  Anxious  Affect:  Constricted  Thought Process:  Goal Directed  Orientation:  Full (Time, Place, and Person)  Thought Content: Rumination   Suicidal Thoughts:  No  Homicidal Thoughts:  No  Memory:  Immediate;   Good Recent;   Good Remote;   Good  Judgement:  Good  Insight:  Good  Psychomotor Activity:  Normal  Concentration:  Concentration: Fair and Attention Span: Fair  Recall:  [employer]  Fund  of Knowledge: Fair  Language: Fair  Akathisia:  No  Handed:  Right  AIMS (if indicated):  0  Assets:  Communication Skills Desire for Improvement Housing Physical Health  ADL's:  Intact  Cognition: WNL  Sleep:  fair   Assessment: Schizoaffective disorder depressed type.  Plan: Reassurance given about his job.  Patient is actively looking and applied many places to get part-time job.  I will reduce Cogentin 0.5 mg to help dry mouth.  Continue Zyprexa 5 mg at bedtime and trazodone 50 mg as needed for insomnia. Discussed medication side effects and benefits.  Recommended to call us back if there is any question, concern or worsening of the symptoms.  Discuss safety plan that anytime having active suicidal thoughts or homicidal thoughts and she need to call 911 or go to the local emergency room.  ARFEEN,SYED T., MD 12/20/2016, 2:36 PM

## 2016-12-21 ENCOUNTER — Other Ambulatory Visit (HOSPITAL_COMMUNITY): Payer: Self-pay | Admitting: Psychiatry

## 2016-12-21 DIAGNOSIS — F259 Schizoaffective disorder, unspecified: Secondary | ICD-10-CM

## 2017-01-28 ENCOUNTER — Other Ambulatory Visit (HOSPITAL_COMMUNITY): Payer: Self-pay | Admitting: Psychiatry

## 2017-01-28 DIAGNOSIS — F259 Schizoaffective disorder, unspecified: Secondary | ICD-10-CM

## 2017-02-02 ENCOUNTER — Other Ambulatory Visit (HOSPITAL_COMMUNITY): Payer: Self-pay

## 2017-02-02 DIAGNOSIS — F259 Schizoaffective disorder, unspecified: Secondary | ICD-10-CM

## 2017-02-02 MED ORDER — TRAZODONE HCL 50 MG PO TABS: ORAL_TABLET | ORAL | 0 refills | Status: DC

## 2017-02-09 ENCOUNTER — Ambulatory Visit (INDEPENDENT_AMBULATORY_CARE_PROVIDER_SITE_OTHER): Payer: Medicaid Other | Admitting: Psychiatry

## 2017-02-09 ENCOUNTER — Encounter (HOSPITAL_COMMUNITY): Payer: Self-pay | Admitting: Psychiatry

## 2017-02-09 VITALS — BP 132/96 | HR 94 | Ht 73.0 in | Wt 253.2 lb

## 2017-02-09 DIAGNOSIS — F259 Schizoaffective disorder, unspecified: Secondary | ICD-10-CM | POA: Diagnosis not present

## 2017-02-09 DIAGNOSIS — Z79899 Other long term (current) drug therapy: Secondary | ICD-10-CM | POA: Diagnosis not present

## 2017-02-09 MED ORDER — ARIPIPRAZOLE 10 MG PO TABS: 10.0000 mg | ORAL_TABLET | Freq: Every day | ORAL | 1 refills | Status: DC

## 2017-02-09 NOTE — Progress Notes (Signed)
BH MD/PA/NP OP Progress Note  02/09/2017 10:48 AM Kenneth Welch  MRN:  [id number]  Chief Complaint:  Chief Complaint    Follow-up     Subjective:  I'm getting more paranoid.  I'm having flashback and some nights I cannot sleep.  I like to go back on Abilify.  HPI: Kenneth Welch came for his appointment earlier than his is scheduled time.  He's been experiencing increased paranoia, irritability, having flashbacks and poor sleep.  He does not feel Zyprexa alone is working as good.  He wants to go back on Abilify but she has taken few years ago with good response.  He also endorse lack of motivation, having fatigue, no desire to do anything.  He admitted not able to find a job also causing a lot of stress.  He admitted recently feels that people are talking about him and he is not comfortable leaving his house.  Though he denies any aggressive behavior but admitted more seclusive, paranoid but denies any auditory or visual hallucination.  He denies drinking alcohol or using any illegal substances.  He lives with his father an elderly sister.  He denies any aggressive behavior.  Denies any dry mouth since Cogentin dose was reduced.  His appetite is okay.  His vital signs are stable.  Visit Diagnosis:    ICD-9-CM ICD-10-CM   1. Schizoaffective disorder, unspecified type (HCC) 295.70 F25.9 ARIPiprazole (ABILIFY) 10 MG tablet    Past Psychiatric History: Reviewed. Patient has multiple psychiatric hospitalization due to paranoia, hallucination, psychosis and depression.  In the past she has taken Abilify.  Past Medical History: History reviewed. No pertinent past medical history. History reviewed. No pertinent surgical history.  Family Psychiatric History: Reviewed.  Family History: History reviewed. No pertinent family history.  Social History:  Social History   Social History  . Marital status: Single    Spouse name: N/A  . Number of children: N/A  . Years of education: N/A   Social  History Main Topics  . Smoking status: Never Smoker  . Smokeless tobacco: Never Used  . Alcohol use No  . Drug use: No  . Sexual activity: No   Other Topics Concern  . None   Social History Narrative  . None    Allergies: No Known Allergies  Metabolic Disorder Labs: Lab Results  Component Value Date   HGBA1C 5.8 (H) 03/28/2016   MPG 120 03/28/2016   MPG 123 (H) 01/09/2015   No results found for: PROLACTIN Lab Results  Component Value Date   CHOL 199 01/09/2015   TRIG 103 01/09/2015   HDL 39 (L) 01/09/2015   CHOLHDL 5.1 01/09/2015   VLDL 21 01/09/2015   LDLCALC 139 (H) 01/09/2015   LDLCALC 128 (H) 12/13/2013     Current Medications: Current Outpatient Prescriptions  Medication Sig Dispense Refill  . benztropine (COGENTIN) 0.5 MG tablet Take 1 tablet (0.5 mg total) by mouth at bedtime. 30 tablet 2  . Homeopathic Products (MENTAL CLARITY IN) Inhale into the lungs.    . Multiple Vitamin (MULTIVITAMIN WITH MINERALS) TABS tablet Take 1 tablet by mouth daily.    Marland Kitchen. OLANZapine (ZYPREXA) 5 MG tablet TAKE 1 TABLET AT BEDTIME 30 tablet 2  . traZODone (DESYREL) 50 MG tablet TAKE 1 TABLET (50 MG TOTAL) BY MOUTH AS NEEDED AT BEDTIME. 30 tablet 0   No current facility-administered medications for this visit.     Neurologic: Headache: No Seizure: No Paresthesias: No  Musculoskeletal: Strength & Muscle Tone: within normal limits  Gait & Station: normal Patient leans: N/A  Psychiatric Specialty Exam: ROS  Blood pressure (!) 132/96, pulse 94, height 6\' 1"  (1.854 m), weight 253 lb 3.2 oz (114.9 kg).Body mass index is 33.41 kg/m.  General Appearance: Casual and Guarded  Eye Contact:  Fair  Speech:  Slow  Volume:  Decreased  Mood:  Anxious and Dysphoric  Affect:  Constricted and Flat  Thought Process:  Descriptions of Associations: Circumstantial  Orientation:  Full (Time, Place, and Person)  Thought Content: Paranoid Ideation, Rumination and Flashbacks   Suicidal  Thoughts:  No  Homicidal Thoughts:  No  Memory:  Immediate;   Fair Recent;   Fair Remote;   Fair  Judgement:  Fair  Insight:  Fair  Psychomotor Activity:  Decreased  Concentration:  Concentration: Fair and Attention Span: Fair  Recall:  [employer] of Knowledge: Good  Language: Fair  Akathisia:  No  Handed:  Right  AIMS (if indicated):  None   Assets:  Desire for Improvement Housing  ADL's:  Intact  Cognition: WNL  Sleep:  Fair    Assessment: Schizoaffective disorder depressed type.  Plan: Patient like to go back on Abilify.  I will discontinue Zyprexa and start Abilify 10 mg daily.  Recommended to continue trazodone and Cogentin as prescribed.  I recommended if symptoms do not improve then he should call us immediately.  Discussed medication side effects and benefits.  Patient has no tremors, shakes or any EPS.  Recommended to call us back if he has any question, concern if he feel worsening of the symptom.  Discuss safety plan that anytime having active suicidal thoughts or homicidal thoughts and he need to call 911 or go to the local emergency room.  Follow-up in 4 weeks.  Kasey Ewings T., MD 02/09/2017, 10:48 AM

## 2017-03-11 ENCOUNTER — Other Ambulatory Visit (HOSPITAL_COMMUNITY): Payer: Self-pay | Admitting: Psychiatry

## 2017-03-11 DIAGNOSIS — F259 Schizoaffective disorder, unspecified: Secondary | ICD-10-CM

## 2017-03-13 ENCOUNTER — Ambulatory Visit (HOSPITAL_COMMUNITY): Payer: Self-pay | Admitting: Psychiatry

## 2017-03-16 ENCOUNTER — Telehealth (HOSPITAL_COMMUNITY): Payer: Self-pay

## 2017-03-16 ENCOUNTER — Other Ambulatory Visit (HOSPITAL_COMMUNITY): Payer: Self-pay | Admitting: Psychiatry

## 2017-03-16 ENCOUNTER — Ambulatory Visit (HOSPITAL_COMMUNITY): Payer: Self-pay | Admitting: Psychiatry

## 2017-03-16 NOTE — Telephone Encounter (Signed)
Medication management - Telephone call with patient to follow up on his message left he was in need of a refill of Cogentin. Reminded patient of scheduled appointment set for 03/20/17 and patient states he has enough until appt.

## 2017-03-20 ENCOUNTER — Encounter (HOSPITAL_COMMUNITY): Payer: Self-pay | Admitting: Psychiatry

## 2017-03-20 ENCOUNTER — Ambulatory Visit (INDEPENDENT_AMBULATORY_CARE_PROVIDER_SITE_OTHER): Payer: Medicaid Other | Admitting: Psychiatry

## 2017-03-20 DIAGNOSIS — Z79899 Other long term (current) drug therapy: Secondary | ICD-10-CM | POA: Diagnosis not present

## 2017-03-20 DIAGNOSIS — F251 Schizoaffective disorder, depressive type: Secondary | ICD-10-CM | POA: Diagnosis not present

## 2017-03-20 DIAGNOSIS — F259 Schizoaffective disorder, unspecified: Secondary | ICD-10-CM

## 2017-03-20 MED ORDER — BENZTROPINE MESYLATE 0.5 MG PO TABS: 0.5000 mg | ORAL_TABLET | Freq: Every day | ORAL | 2 refills | Status: DC

## 2017-03-20 MED ORDER — ARIPIPRAZOLE 10 MG PO TABS: 10.0000 mg | ORAL_TABLET | Freq: Every day | ORAL | 2 refills | Status: DC

## 2017-03-20 MED ORDER — TRAZODONE HCL 50 MG PO TABS: ORAL_TABLET | ORAL | 2 refills | Status: DC

## 2017-03-20 NOTE — Progress Notes (Signed)
BH MD/PA/NP OP Progress Note  03/20/2017 10:12 AM Kenneth Welch  MRN:  [id number]  Chief Complaint:  Subjective:  I'm feeling better since I started Abilify.  I have no more flashbacks and sleeping better.  HPI: Kenneth Welch came for his follow-up appointment.  On his last visit we switched Zyprexa back to Abilify.  He requested Zyprexa because he did research on it and he felt it may be helpful for his sleep.  However he started to have flashback, poor sleep, weight gain and depression.  Since back on Abilify he is feeling much better.  He denies any irritability, paranoia or any hallucination.  He sleeping better.  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  He lives with his father who is very supportive.  His mother is deceased in Apr 12, 2015.  Patient denies any aggression, impulsive behavior, suicidal thoughts or any crying spells.  His energy level is good.  He lost weight from the past and he feels more social and active.  He has no tremors shakes or any EPS.  Patient denies drinking alcohol or using any illegal substances.  Visit Diagnosis:    ICD-9-CM ICD-10-CM   1. Schizoaffective disorder, unspecified type (HCC) 295.70 F25.9 traZODone (DESYREL) 50 MG tablet     benztropine (COGENTIN) 0.5 MG tablet     ARIPiprazole (ABILIFY) 10 MG tablet    Past Psychiatric History: Reviewed. Patient has multiple psychiatric hospitalization due to paranoia, hallucination, psychosis and depression.  In the past she has taken zyprexa which did not help.   Past Medical History: History reviewed. No pertinent past medical history. History reviewed. No pertinent surgical history.  Family Psychiatric History: Reviewed.  Family History: History reviewed. No pertinent family history.  Social History:  Social History   Social History  . Marital status: Single    Spouse name: N/A  . Number of children: N/A  . Years of education: N/A   Social History Main Topics   . Smoking status: Never Smoker  . Smokeless tobacco: Never Used  . Alcohol use No  . Drug use: No  . Sexual activity: No   Other Topics Concern  . None   Social History Narrative  . None    Allergies: No Known Allergies  Metabolic Disorder Labs: Lab Results  Component Value Date   HGBA1C 5.8 (H) 03/28/2016   MPG 120 03/28/2016   MPG 123 (H) 01/09/2015   No results found for: PROLACTIN Lab Results  Component Value Date   CHOL 199 01/09/2015   TRIG 103 01/09/2015   HDL 39 (L) 01/09/2015   CHOLHDL 5.1 01/09/2015   VLDL 21 01/09/2015   LDLCALC 139 (H) 01/09/2015   LDLCALC 128 (H) 12/13/2013     Current Medications: Current Outpatient Prescriptions  Medication Sig Dispense Refill  . ARIPiprazole (ABILIFY) 10 MG tablet Take 1 tablet (10 mg total) by mouth daily. 30 tablet 1  . benztropine (COGENTIN) 0.5 MG tablet Take 1 tablet (0.5 mg total) by mouth at bedtime. 30 tablet 2  . Homeopathic Products (MENTAL CLARITY IN) Inhale into the lungs.    . Multiple Vitamin (MULTIVITAMIN WITH MINERALS) TABS tablet Take 1 tablet by mouth daily.    . traZODone (DESYREL) 50 MG tablet TAKE 1 TABLET (50 MG TOTAL) BY MOUTH AS NEEDED AT BEDTIME. 30 tablet 0   No current facility-administered medications for this visit.     Neurologic: Headache: No Seizure: No Paresthesias: No  Musculoskeletal: Strength &  Muscle Tone: within normal limits Gait & Station: normal Patient leans: N/A  Psychiatric Specialty Exam: ROS  Blood pressure 128/76, pulse 88, height 6' (1.829 m), weight 239 lb (108.4 kg).Body mass index is 32.41 kg/m.  General Appearance: Casual  Eye Contact:  Fair  Speech:  Slow  Volume:  Decreased  Mood:  Anxious  Affect:  Constricted  Thought Process:  Goal Directed  Orientation:  Full (Time, Place, and Person)  Thought Content: Logical   Suicidal Thoughts:  No  Homicidal Thoughts:  No  Memory:  Immediate;   Good Recent;   Good Remote;   Good  Judgement:  Good   Insight:  Good  Psychomotor Activity:  Normal  Concentration:  Concentration: Fair and Attention Span: Fair  Recall:  Good  Fund of Knowledge: Good  Language: Good  Akathisia:  No  Handed:  Right  AIMS (if indicated):  0  Assets:  Communication Skills Desire for Improvement Housing Physical Health  ADL's:  Intact  Cognition: WNL  Sleep:  adequate    Assessment: Skis affect are disorder depressed type.  Plan: Patient is doing better on Abilify.  Discussed medication side effects and benefits.  Recommended to continue Abilify 10 mg it is helping his symptoms.  Continue trazodone 50 mg at bedtime for insomnia and Cogentin 0.5 mg at bedtime to help the EPS.  Recommended to call us back if he has any question, concern or if he feel worsening of the symptom.  Follow-up in 3 months.  Josedaniel Haye T., MD 03/20/2017, 10:12 AM

## 2017-03-22 ENCOUNTER — Ambulatory Visit (HOSPITAL_COMMUNITY): Payer: Self-pay | Admitting: Psychiatry

## 2017-04-08 ENCOUNTER — Other Ambulatory Visit (HOSPITAL_COMMUNITY): Payer: Self-pay | Admitting: Psychiatry

## 2017-04-08 DIAGNOSIS — F259 Schizoaffective disorder, unspecified: Secondary | ICD-10-CM

## 2017-06-17 ENCOUNTER — Other Ambulatory Visit (HOSPITAL_COMMUNITY): Payer: Self-pay | Admitting: Psychiatry

## 2017-06-17 DIAGNOSIS — F259 Schizoaffective disorder, unspecified: Secondary | ICD-10-CM

## 2017-06-19 ENCOUNTER — Other Ambulatory Visit (HOSPITAL_COMMUNITY): Payer: Self-pay | Admitting: Psychiatry

## 2017-06-19 ENCOUNTER — Ambulatory Visit (HOSPITAL_COMMUNITY): Payer: Self-pay | Admitting: Psychiatry

## 2017-06-20 ENCOUNTER — Ambulatory Visit (INDEPENDENT_AMBULATORY_CARE_PROVIDER_SITE_OTHER): Payer: Medicaid Other | Admitting: Psychiatry

## 2017-06-20 ENCOUNTER — Encounter (HOSPITAL_COMMUNITY): Payer: Self-pay | Admitting: Psychiatry

## 2017-06-20 DIAGNOSIS — F259 Schizoaffective disorder, unspecified: Secondary | ICD-10-CM

## 2017-06-20 MED ORDER — BENZTROPINE MESYLATE 0.5 MG PO TABS: 0.5000 mg | ORAL_TABLET | Freq: Every day | ORAL | 2 refills | Status: DC

## 2017-06-20 MED ORDER — ARIPIPRAZOLE 10 MG PO TABS: 10.0000 mg | ORAL_TABLET | Freq: Every day | ORAL | 2 refills | Status: DC

## 2017-06-20 NOTE — Progress Notes (Signed)
BH MD/PA/NP OP Progress Note  06/20/2017 2:55 PM Kenneth Welch L Pettengill  MRN:  [id number]  Chief Complaint:  Subjective:  I'm sleeping better.  I lost more than 40 pounds in 3 months.  HPI: Kenneth Welch came for his follow-up appointment.  He is taking Abilify which is helping his flashback, hallucination and paranoia.  He is more active and social.  He started going to gym and he has lost more than 40 pounds in past 4 months.  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  He rarely takes trazodone.  He has no side effects with Abilify.  He has no rash, itching, tremors or shakes.  His sleep is good.  He lives with his father who is very supportive.  Patient denies any crying spells, feelings of hopelessness or worthlessness.  He denies any suicidal thoughts.  His energy level is good.  He denies drinking alcohol or using any illegal substances.  His appetite is okay.  Visit Diagnosis:    ICD-10-CM   1. Schizoaffective disorder, unspecified type (HCC) F25.9 benztropine (COGENTIN) 0.5 MG tablet    ARIPiprazole (ABILIFY) 10 MG tablet    Past Psychiatric History: Reviewed. Patient has multiple psychiatric hospitalization due to paranoia, hallucination, psychosis and depression. In the past she has taken zyprexa which did not help.   Past Medical History: No past medical history on file. No past surgical history on file.  Family Psychiatric History: Reviewed.  Family History: No family history on file.  Social History:  Social History   Social History  . Marital status: Single    Spouse name: N/A  . Number of children: N/A  . Years of education: N/A   Social History Main Topics  . Smoking status: Never Smoker  . Smokeless tobacco: Never Used  . Alcohol use No  . Drug use: No  . Sexual activity: No   Other Topics Concern  . Not on file   Social History Narrative  . No narrative on file    Allergies: No Known Allergies  Metabolic  Disorder Labs: Lab Results  Component Value Date   HGBA1C 5.8 (H) 03/28/2016   MPG 120 03/28/2016   MPG 123 (H) 01/09/2015   No results found for: PROLACTIN Lab Results  Component Value Date   CHOL 199 01/09/2015   TRIG 103 01/09/2015   HDL 39 (L) 01/09/2015   CHOLHDL 5.1 01/09/2015   VLDL 21 01/09/2015   LDLCALC 139 (H) 01/09/2015   LDLCALC 128 (H) 12/13/2013     Current Medications: Current Outpatient Prescriptions  Medication Sig Dispense Refill  . ARIPiprazole (ABILIFY) 10 MG tablet Take 1 tablet (10 mg total) by mouth daily. 30 tablet 2  . benztropine (COGENTIN) 0.5 MG tablet Take 1 tablet (0.5 mg total) by mouth at bedtime. 30 tablet 2  . Homeopathic Products (MENTAL CLARITY IN) Inhale into the lungs.    . Multiple Vitamin (MULTIVITAMIN WITH MINERALS) TABS tablet Take 1 tablet by mouth daily.    . traZODone (DESYREL) 50 MG tablet TAKE 1 TABLET (50 MG TOTAL) BY MOUTH AS NEEDED AT BEDTIME. 30 tablet 2   No current facility-administered medications for this visit.     Neurologic: Headache: No Seizure: No Paresthesias: No  Musculoskeletal: Strength & Muscle Tone: within normal limits Gait & Station: normal Patient leans: N/A  Psychiatric Specialty Exam: ROS  Blood pressure 133/79, pulse 84, height 6\' 1"  (1.854 m), weight 201 lb (91.2 kg).Body mass  index is 26.52 kg/m.  General Appearance: Casual  Eye Contact:  Fair  Speech:  Slow  Volume:  Decreased  Mood:  Euthymic  Affect:  Flat  Thought Process:  Goal Directed  Orientation:  Full (Time, Place, and Person)  Thought Content: Logical   Suicidal Thoughts:  No  Homicidal Thoughts:  No  Memory:  Immediate;   Fair Recent;   Good Remote;   Good  Judgement:  Good  Insight:  Good  Psychomotor Activity:  Normal  Concentration:  Concentration: Fair and Attention Span: Fair  Recall:  [employer] of Knowledge: Fair  Language: Good  Akathisia:  No  Handed:  Right  AIMS (if indicated):  0  Assets:   Communication Skills Desire for Improvement Housing Social Support  ADL's:  Intact  Cognition: WNL  Sleep:  good   Assessment: Schizoaffective disorder bipolar type.  Plan: Patient is doing better on Abilify.  He lost more than 40 pounds since he stopped the Zyprexa.  He is more social, active and increased energy level.  I will continue Abilify 10 mg daily and Cogentin 0.5 mg at bedtime.  He does not need a new prescription of trazodone at this time but he will call us if he needed.  Follow-up in 3 months.  Gesselle Fitzsimons T., MD 06/20/2017, 2:55 PM

## 2017-09-09 ENCOUNTER — Other Ambulatory Visit (HOSPITAL_COMMUNITY): Payer: Self-pay | Admitting: Psychiatry

## 2017-09-09 DIAGNOSIS — F259 Schizoaffective disorder, unspecified: Secondary | ICD-10-CM

## 2017-09-20 ENCOUNTER — Ambulatory Visit (INDEPENDENT_AMBULATORY_CARE_PROVIDER_SITE_OTHER): Payer: Medicaid Other | Admitting: Psychiatry

## 2017-09-20 ENCOUNTER — Encounter (HOSPITAL_COMMUNITY): Payer: Self-pay | Admitting: Psychiatry

## 2017-09-20 ENCOUNTER — Other Ambulatory Visit (HOSPITAL_COMMUNITY): Payer: Self-pay | Admitting: Psychiatry

## 2017-09-20 VITALS — BP 122/70 | HR 89 | Ht 73.0 in | Wt 201.0 lb

## 2017-09-20 DIAGNOSIS — F259 Schizoaffective disorder, unspecified: Secondary | ICD-10-CM

## 2017-09-20 DIAGNOSIS — Z79899 Other long term (current) drug therapy: Secondary | ICD-10-CM | POA: Diagnosis not present

## 2017-09-20 DIAGNOSIS — F250 Schizoaffective disorder, bipolar type: Secondary | ICD-10-CM | POA: Diagnosis not present

## 2017-09-20 MED ORDER — ARIPIPRAZOLE 10 MG PO TABS: 10.0000 mg | ORAL_TABLET | Freq: Every day | ORAL | 2 refills | Status: DC

## 2017-09-20 MED ORDER — BENZTROPINE MESYLATE 0.5 MG PO TABS
0.5000 mg | ORAL_TABLET | Freq: Every day | ORAL | 2 refills | Status: DC
Start: 2017-09-20 — End: 2018-01-18

## 2017-09-20 NOTE — Progress Notes (Signed)
BH MD/PA/NP OP Progress Note  09/20/2017 10:20 AM Kenneth Welch  MRN:  [id number]  Chief Complaint:  [facility] at [employer]  HPI: Kenneth Welch came for his follow-up appointment.  He is compliant with Abilify and Cogentin.  He rarely takes trazodone.  He sleeping good.  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  He admitted in the beginning was easy but now he has a hard time in the class.  But he is committed to finish it.  He still looking for a job and so far he has not received any interviews.  He denies any irritability, anger, mania, psychosis or any hallucination.  He sleeping good.  He denies any crying spells or any feeling of hopelessness or worthlessness.  He lives with his father who is very supportive.  Patient denies drinking alcohol or using any illegal substances.  He continues to go to gym twice a week and he is active.  Patient has no tremors, shakes or any EPS.  His energy level is good.  His vital signs are stable.  Visit Diagnosis:    ICD-10-CM   1. Encounter for long-term (current) use of medications Z79.899 CBC with Differential/Platelet    Hemoglobin A1c    Comprehensive metabolic panel    TSH    CBC with Differential/Platelet    Hemoglobin A1c    Comprehensive metabolic panel    TSH  2. Schizoaffective disorder, unspecified type (HCC) F25.9 benztropine (COGENTIN) 0.5 MG tablet    ARIPiprazole (ABILIFY) 10 MG tablet    CBC with Differential/Platelet    Hemoglobin A1c    Comprehensive metabolic panel    TSH    CBC with Differential/Platelet    Hemoglobin A1c    Comprehensive metabolic panel    TSH    Past Psychiatric History: Reviewed. Patient has multiple psychiatric hospitalization due to paranoia, hallucination, psychosis and depression. In the past he has taken zyprexa which did not help.   Past Medical History: No past medical history on file. No past surgical history on file.  Family Psychiatric History: Reviewed.  Family History: No family  history on file.  Social History:  Social History   Social History  . Marital status: Single    Spouse name: N/A  . Number of children: N/A  . Years of education: N/A   Social History Main Topics  . Smoking status: Never Smoker  . Smokeless tobacco: Never Used  . Alcohol use No  . Drug use: No  . Sexual activity: No   Other Topics Concern  . Not on file   Social History Narrative  . No narrative on file    Allergies: No Known Allergies  Metabolic Disorder Labs: Lab Results  Component Value Date   HGBA1C 5.8 (H) 03/28/2016   MPG 120 03/28/2016   MPG 123 (H) 01/09/2015   No results found for: PROLACTIN Lab Results  Component Value Date   CHOL 199 01/09/2015   TRIG 103 01/09/2015   HDL 39 (L) 01/09/2015   CHOLHDL 5.1 01/09/2015   VLDL 21 01/09/2015   LDLCALC 139 (H) 01/09/2015   LDLCALC 128 (H) 12/13/2013   Lab Results  Component Value Date   TSH 2.70 03/28/2016   TSH 2.072 01/09/2015    Therapeutic Level Labs: No results found for: LITHIUM No results found for: VALPROATE No components found for:  CBMZ  Current Medications: Current Outpatient Prescriptions  Medication Sig Dispense Refill  . ARIPiprazole (ABILIFY) 10 MG tablet Take 1 tablet (10 mg total)  by mouth daily. 30 tablet 2  . benztropine (COGENTIN) 0.5 MG tablet Take 1 tablet (0.5 mg total) by mouth at bedtime. 30 tablet 2  . Homeopathic Products (MENTAL CLARITY IN) Inhale into the lungs.    . Multiple Vitamin (MULTIVITAMIN WITH MINERALS) TABS tablet Take 1 tablet by mouth daily.    . traZODone (DESYREL) 50 MG tablet TAKE 1 TABLET (50 MG TOTAL) BY MOUTH AS NEEDED AT BEDTIME. 30 tablet 2   No current facility-administered medications for this visit.      Musculoskeletal: Strength & Muscle Tone: within normal limits Gait & Station: normal Patient leans: N/A  Psychiatric Specialty Exam: ROS  Blood pressure 122/70, pulse 89, height 6\' 1"  (1.854 m), weight 201 lb (91.2 kg).Body mass index is  26.52 kg/m.  General Appearance: Casual and Shy  Eye Contact:  Fair  Speech:  Slow  Volume:  Decreased  Mood:  Anxious  Affect:  Constricted  Thought Process:  Goal Directed  Orientation:  Full (Time, Place, and Person)  Thought Content: Logical and Poverty of thought content   Suicidal Thoughts:  No  Homicidal Thoughts:  No  Memory:  Immediate;   Fair Recent;   Fair Remote;   Fair  Judgement:  Good  Insight:  Good  Psychomotor Activity:  Normal  Concentration:  Concentration: Fair and Attention Span: Fair  Recall:  [employer] of Knowledge: Good  Language: Good  Akathisia:  No  Handed:  Right  AIMS (if indicated): not done  Assets:  Communication Skills Desire for Improvement Housing Social Support Vocational/Educational  ADL's:  Intact  Cognition: WNL  Sleep:  Good   Screenings:   Assessment and Plan: Schizoaffective disorder, bipolar type.  Patient is a stable on Abilify 10 mg daily and Cogentin 0.5 mg at bedtime.  He does not need a new prescription of trazodone.  He has no tremors or shakes.  We will do blood work including CBC, CMP, TSH and hemoglobin A1c.  Recommended to call us back if he has any question or any concern.  Follow-up in 3 months.  Jahmya Onofrio T., MD 09/20/2017, 10:20 AM

## 2017-09-21 LAB — COMPREHENSIVE METABOLIC PANEL
ALT: 11 IU/L (ref 0–44)
AST: 17 IU/L (ref 0–40)
Albumin/Globulin Ratio: 2.2 (ref 1.2–2.2)
Albumin: 5.1 g/dL (ref 3.5–5.5)
Alkaline Phosphatase: 53 IU/L (ref 39–117)
BUN/Creatinine Ratio: 17 (ref 9–20)
BUN: 22 mg/dL — ABNORMAL HIGH (ref 6–20)
Bilirubin Total: 0.5 mg/dL (ref 0.0–1.2)
CO2: 25 mmol/L (ref 20–29)
Calcium: 9.9 mg/dL (ref 8.7–10.2)
Chloride: 101 mmol/L (ref 96–106)
Creatinine, Ser: 1.31 mg/dL — ABNORMAL HIGH (ref 0.76–1.27)
GFR calc Af Amer: 84 mL/min/{1.73_m2} (ref >59)
GFR calc non Af Amer: 73 mL/min/{1.73_m2} (ref >59)
Globulin, Total: 2.3 g/dL (ref 1.5–4.5)
Glucose: 67 mg/dL (ref 65–99)
Potassium: 4.7 mmol/L (ref 3.5–5.2)
Sodium: 141 mmol/L (ref 134–144)
Total Protein: 7.4 g/dL (ref 6.0–8.5)

## 2017-09-21 LAB — CBC WITH DIFFERENTIAL/PLATELET
Basophils Absolute: 0 10*3/uL (ref 0.0–0.2)
Basos: 0 %
EOS (ABSOLUTE): 0.1 10*3/uL (ref 0.0–0.4)
Eos: 1 %
Hematocrit: 46.3 % (ref 37.5–51.0)
Hemoglobin: 15.3 g/dL (ref 13.0–17.7)
Immature Grans (Abs): 0 10*3/uL (ref 0.0–0.1)
Immature Granulocytes: 0 %
Lymphocytes Absolute: 2.2 10*3/uL (ref 0.7–3.1)
Lymphs: 29 %
MCH: 30.2 pg (ref 26.6–33.0)
MCHC: 33 g/dL (ref 31.5–35.7)
MCV: 91 fL (ref 79–97)
Monocytes Absolute: 0.4 10*3/uL (ref 0.1–0.9)
Monocytes: 6 %
Neutrophils Absolute: 4.9 10*3/uL (ref 1.4–7.0)
Neutrophils: 64 %
Platelets: 242 10*3/uL (ref 150–379)
RBC: 5.07 x10E6/uL (ref 4.14–5.80)
RDW: 15.1 % (ref 12.3–15.4)
WBC: 7.5 10*3/uL (ref 3.4–10.8)

## 2017-09-21 LAB — HEMOGLOBIN A1C
Est. average glucose Bld gHb Est-mCnc: 114 mg/dL
Hgb A1c MFr Bld: 5.6 % (ref 4.8–5.6)

## 2017-09-21 LAB — TSH: TSH: 2.08 u[IU]/mL (ref 0.450–4.500)

## 2017-12-21 ENCOUNTER — Ambulatory Visit (HOSPITAL_COMMUNITY): Payer: Self-pay | Admitting: Psychiatry

## 2017-12-26 ENCOUNTER — Ambulatory Visit (HOSPITAL_COMMUNITY): Payer: Self-pay | Admitting: Psychiatry

## 2018-01-07 ENCOUNTER — Other Ambulatory Visit (HOSPITAL_COMMUNITY): Payer: Self-pay | Admitting: Psychiatry

## 2018-01-07 DIAGNOSIS — F259 Schizoaffective disorder, unspecified: Secondary | ICD-10-CM

## 2018-01-18 ENCOUNTER — Encounter (HOSPITAL_COMMUNITY): Payer: Self-pay | Admitting: Psychiatry

## 2018-01-18 ENCOUNTER — Ambulatory Visit (INDEPENDENT_AMBULATORY_CARE_PROVIDER_SITE_OTHER): Payer: Medicaid Other | Admitting: Psychiatry

## 2018-01-18 DIAGNOSIS — F259 Schizoaffective disorder, unspecified: Secondary | ICD-10-CM

## 2018-01-18 MED ORDER — BENZTROPINE MESYLATE 0.5 MG PO TABS: 0.5000 mg | ORAL_TABLET | Freq: Every day | ORAL | 2 refills | Status: DC

## 2018-01-18 MED ORDER — ARIPIPRAZOLE 10 MG PO TABS: 10.0000 mg | ORAL_TABLET | Freq: Every day | ORAL | 2 refills | Status: DC

## 2018-01-18 NOTE — Progress Notes (Signed)
BH MD/PA/NP OP Progress Note  01/18/2018 4:15 PM BROC CASPERS  MRN:  [id number]  Chief Complaint: I moved to [place].  I am living with my roommate.  I am happy.  HPI: Jerran came for his follow-up appointment.  He moved 4 weeks ago in [place] to live with a roommate.  He is very happy.  He is still looking for a job and so far no interview.  He is taking his Abilify and Cogentin.  He denies any tremors, shakes or any EPS.  He denies any paranoia or any hallucination.  He has not taken trazodone in a while.  Denies any irritability, anger or any crying spells.  His energy level is fair.  He has no tremors or shakes.  He wants to continue his medication.  His vital signs are stable.  He had blood work on his last visit which is normal.  He has mild BUN high and creatinine 1.3.  Visit Diagnosis:    ICD-10-CM   1. Schizoaffective disorder, unspecified type (HCC) F25.9 benztropine (COGENTIN) 0.5 MG tablet    ARIPiprazole (ABILIFY) 10 MG tablet    DISCONTINUED: benztropine (COGENTIN) 0.5 MG tablet    DISCONTINUED: ARIPiprazole (ABILIFY) 10 MG tablet    Past Psychiatric History: Reviewed. Patient has multiple psychiatric hospitalization due to paranoia, hallucination, psychosis and depression. In the past he has taken zyprexa which did not help.   Past Medical History: No past medical history on file. No past surgical history on file.  Family Psychiatric History: Reviewed.  Family History: No family history on file.  Social History:  Social History   Socioeconomic History  . Marital status: Single    Spouse name: Not on file  . Number of children: Not on file  . Years of education: Not on file  . Highest education level: Not on file  Social Needs  . Financial resource strain: Not on file  . Food insecurity - worry: Not on file  . Food insecurity - inability: Not on file  . Transportation needs - medical: Not on file  . Transportation needs - non-medical: Not on  file  Occupational History  . Not on file  Tobacco Use  . Smoking status: Never Smoker  . Smokeless tobacco: Never Used  Substance and Sexual Activity  . Alcohol use: No    Alcohol/week: 0.0 oz  . Drug use: No  . Sexual activity: No  Other Topics Concern  . Not on file  Social History Narrative  . Not on file    Allergies: No Known Allergies  Metabolic Disorder Labs: No results found for this or any previous visit (from the past 2160 hour(s)). Lab Results  Component Value Date   HGBA1C 5.6 09/20/2017   MPG 120 03/28/2016   MPG 123 (H) 01/09/2015   No results found for: PROLACTIN Lab Results  Component Value Date   CHOL 199 01/09/2015   TRIG 103 01/09/2015   HDL 39 (L) 01/09/2015   CHOLHDL 5.1 01/09/2015   VLDL 21 01/09/2015   LDLCALC 139 (H) 01/09/2015   LDLCALC 128 (H) 12/13/2013   Lab Results  Component Value Date   TSH 2.080 09/20/2017   TSH 2.70 03/28/2016    Therapeutic Level Labs: No results found for: LITHIUM No results found for: VALPROATE No components found for:  CBMZ  Current Medications: Current Outpatient Medications  Medication Sig Dispense Refill  . ARIPiprazole (ABILIFY) 10 MG tablet Take 1 tablet (10 mg total) by mouth daily. 30 tablet 2  .  benztropine (COGENTIN) 0.5 MG tablet Take 1 tablet (0.5 mg total) by mouth at bedtime. 30 tablet 2  . cetirizine (ZYRTEC) 10 MG tablet TAKE 1 TABLET BY MOUTH ONCE EVERY EVENING  5  . Homeopathic Products (MENTAL CLARITY IN) Inhale into the lungs.    . Multiple Vitamin (MULTIVITAMIN WITH MINERALS) TABS tablet Take 1 tablet by mouth daily.    Marland Kitchen. PROAIR HFA 108 (90 Base) MCG/ACT inhaler INHALE 2 PUFFS INTO THE LUNGS EVERY 4 HOURS AS NEEDED  5  . traZODone (DESYREL) 50 MG tablet TAKE 1 TABLET (50 MG TOTAL) BY MOUTH AS NEEDED AT BEDTIME. 30 tablet 2   No current facility-administered medications for this visit.      Musculoskeletal: Strength & Muscle Tone: within normal limits Gait & Station:  normal Patient leans: N/A  Psychiatric Specialty Exam: ROS  Blood pressure 124/78, pulse 88, height 6\' 1"  (1.854 m), weight 190 lb 9.6 oz (86.5 kg).There is no height or weight on file to calculate BMI.  General Appearance: Casual  Eye Contact:  Good  Speech:  Slow  Volume:  Normal  Mood:  Anxious  Affect:  Restricted  Thought Process:  Goal Directed  Orientation:  Full (Time, Place, and Person)  Thought Content: Logical   Suicidal Thoughts:  No  Homicidal Thoughts:  No  Memory:  Immediate;   Fair Recent;   Fair Remote;   Fair  Judgement:  Good  Insight:  Good  Psychomotor Activity:  Normal  Concentration:  Concentration: Fair and Attention Span: Fair  Recall:  Good  Fund of Knowledge: Good  Language: Good  Akathisia:  No  Handed:  Right  AIMS (if indicated): not done  Assets:  Communication Skills Desire for Improvement Housing Physical Health  ADL's:  Intact  Cognition: WNL  Sleep:  Good   Screenings:   Assessment and Plan: Schizoaffective disorder bipolar type.  Patient is a stable on his current medication.  He has no tremors, shakes or any EPS.  Continue Abilify 10 mg daily and Cogentin 0.5 mg at bedtime.  Reviewed blood work results with him.  Encourage hydration and watch his calorie intake.  Recommended healthy lifestyle.  Follow-up in 3 months.   Cleotis NipperSyed T Elzena Muston, MD 01/18/2018, 4:15 PM

## 2018-03-28 ENCOUNTER — Ambulatory Visit (HOSPITAL_COMMUNITY)
Admission: EM | Admit: 2018-03-28 | Discharge: 2018-03-28 | Disposition: A | Discharge status: Home / Self Care | Payer: Medicaid Other | Attending: Family Medicine | Admitting: Family Medicine

## 2018-03-28 ENCOUNTER — Encounter (HOSPITAL_COMMUNITY): Payer: Self-pay | Admitting: Emergency Medicine

## 2018-03-28 DIAGNOSIS — Z202 Contact with and (suspected) exposure to infections with a predominantly sexual mode of transmission: Secondary | ICD-10-CM | POA: Diagnosis not present

## 2018-03-28 DIAGNOSIS — R300 Dysuria: Secondary | ICD-10-CM

## 2018-03-28 DIAGNOSIS — R369 Urethral discharge, unspecified: Secondary | ICD-10-CM | POA: Diagnosis not present

## 2018-03-28 DIAGNOSIS — A64 Unspecified sexually transmitted disease: Secondary | ICD-10-CM

## 2018-03-28 DIAGNOSIS — Z113 Encounter for screening for infections with a predominantly sexual mode of transmission: Secondary | ICD-10-CM

## 2018-03-28 LAB — POCT URINALYSIS DIP (DEVICE)
Glucose, UA: NEGATIVE mg/dL
Ketones, ur: NEGATIVE mg/dL
Nitrite: NEGATIVE
Protein, ur: 100 mg/dL — AB
Specific Gravity, Urine: >=1.03 (ref 1.005–1.030)
Urobilinogen, UA: 1 mg/dL (ref 0.0–1.0)
pH: 7 (ref 5.0–8.0)

## 2018-03-28 MED ORDER — STERILE WATER FOR INJECTION IJ SOLN
INTRAMUSCULAR | Status: AC
  Filled 2018-03-28: qty 10

## 2018-03-28 MED ORDER — CEFTRIAXONE SODIUM 250 MG IJ SOLR
250.0000 mg | Freq: Once | INTRAMUSCULAR | Status: AC
  Administered 2018-03-28: 250 mg via INTRAMUSCULAR

## 2018-03-28 MED ORDER — AZITHROMYCIN 250 MG PO TABS
1000.0000 mg | ORAL_TABLET | Freq: Once | ORAL | Status: AC
  Administered 2018-03-28: 1000 mg via ORAL

## 2018-03-28 MED ORDER — CEFTRIAXONE SODIUM 250 MG IJ SOLR
INTRAMUSCULAR | Status: AC
  Filled 2018-03-28: qty 250

## 2018-03-28 MED ORDER — AZITHROMYCIN 250 MG PO TABS
ORAL_TABLET | ORAL | Status: AC
  Filled 2018-03-28: qty 4

## 2018-03-28 NOTE — ED Triage Notes (Signed)
Pt here with UTI sx per pt

## 2018-03-28 NOTE — ED Provider Notes (Signed)
[facility]   [id number] 03/28/18 Arrival Time: 1142   SUBJECTIVE:  Kenneth Welch is a 32 y.o. male who presents to the urgent care with complaint of purulent penile discharge for a week with dysuria, no fever or joint pain  SSP     History reviewed. No pertinent past medical history. History reviewed. No pertinent family history. Social History   Socioeconomic History  . Marital status: Single    Spouse name: Not on file  . Number of children: Not on file  . Years of education: Not on file  . Highest education level: Not on file  Occupational History  . Not on file  Social Needs  . Financial resource strain: Not on file  . Food insecurity:    Worry: Not on file    Inability: Not on file  . Transportation needs:    Medical: Not on file    Non-medical: Not on file  Tobacco Use  . Smoking status: Never Smoker  . Smokeless tobacco: Never Used  Substance and Sexual Activity  . Alcohol use: No    Alcohol/week: 0.0 oz  . Drug use: No  . Sexual activity: Never  Lifestyle  . Physical activity:    Days per week: Not on file    Minutes per session: Not on file  . Stress: Not on file  Relationships  . Social connections:    Talks on phone: Not on file    Gets together: Not on file    Attends religious service: Not on file    Active member of club or organization: Not on file    Attends meetings of clubs or organizations: Not on file    Relationship status: Not on file  . Intimate partner violence:    Fear of current or ex partner: Not on file    Emotionally abused: Not on file    Physically abused: Not on file    Forced sexual activity: Not on file  Other Topics Concern  . Not on file  Social History Narrative  . Not on file   No outpatient medications have been marked as taking for the 03/28/18 encounter [facility] Encounter).   No Known Allergies    ROS: As per HPI, remainder of ROS negative.   OBJECTIVE:   Vitals:   03/28/18 1209    BP: (!) 150/73  Pulse: 85  Resp: 18  Temp: 98.6 F (37 C)  TempSrc: Oral  SpO2: 100%     General appearance: alert; no distress Eyes: PERRL; EOMI; conjunctiva normal HENT: normocephalic; atraumatic;  oral mucosa normal Neck: supple Genitalia:  Normal scrotum, circumcised, yellow thick discharge from meatus Back: no CVA tenderness Extremities: no cyanosis or edema; symmetrical with no gross deformities Skin: warm and dry Neurologic: normal gait; grossly normal Psychological: alert and cooperative; normal mood and affect      Labs:  Results for orders placed or performed during the hospital encounter of 03/28/18  POCT urinalysis dip (device)  Result Value Ref Range   Glucose, UA NEGATIVE NEGATIVE mg/dL   Bilirubin Urine SMALL (A) NEGATIVE   Ketones, ur NEGATIVE NEGATIVE mg/dL   Specific Gravity, Urine >=1.030 1.005 - 1.030   Hgb urine dipstick TRACE (A) NEGATIVE   pH 7.0 5.0 - 8.0   Protein, ur 100 (A) NEGATIVE mg/dL   Urobilinogen, UA 1.0 0.0 - 1.0 mg/dL   Nitrite NEGATIVE NEGATIVE   Leukocytes, UA LARGE (A) NEGATIVE    Labs Reviewed  POCT URINALYSIS DIP (DEVICE) -  Abnormal; Notable for the following components:      Result Value   Bilirubin Urine SMALL (*)    Hgb urine dipstick TRACE (*)    Protein, ur 100 (*)    Leukocytes, UA LARGE (*)    All other components within normal limits  URINE CULTURE  HIV ANTIBODY (ROUTINE TESTING)  RPR  URINE CYTOLOGY ANCILLARY ONLY    No results found.     ASSESSMENT & PLAN:  1. STD (male)     Meds ordered this encounter  Medications  . azithromycin (ZITHROMAX) tablet 1,000 mg  . cefTRIAXone (ROCEPHIN) injection 250 mg    Reviewed expectations re: course of current medical issues. Questions answered. Outlined signs and symptoms indicating need for more acute intervention. Patient verbalized understanding. After Visit Summary given.    Procedures:      Elvina SidleLauenstein, Belford Pascucci, MD 03/28/18 1247

## 2018-03-29 ENCOUNTER — Telehealth (HOSPITAL_COMMUNITY): Payer: Self-pay

## 2018-03-29 LAB — RPR: RPR Ser Ql: NONREACTIVE

## 2018-03-29 LAB — URINE CULTURE: Culture: NO GROWTH

## 2018-03-29 LAB — HIV ANTIBODY (ROUTINE TESTING W REFLEX): HIV Screen 4th Generation wRfx: NONREACTIVE

## 2018-03-29 LAB — URINE CYTOLOGY ANCILLARY ONLY
Chlamydia: NEGATIVE
Neisseria Gonorrhea: POSITIVE — AB
Trichomonas: NEGATIVE

## 2018-03-29 NOTE — Telephone Encounter (Signed)
Test for gonorrhea was positive. This was treated at the urgent care visit with IM rocephin 250mg and po zithromax 1g. Pt called regarding test results, instructed patient to refrain from sexual intercourse for 7 days after treatment to give the medicine time to work. Sexual partners need to be notified and tested/treated. Condoms may reduce risk of reinfection. Recheck or followup with PCP for further evaluation if symptoms are not improving. Answered all patient questions. [facility] notified.  

## 2018-04-17 ENCOUNTER — Encounter (HOSPITAL_COMMUNITY): Payer: Self-pay | Admitting: Psychiatry

## 2018-04-17 ENCOUNTER — Ambulatory Visit (INDEPENDENT_AMBULATORY_CARE_PROVIDER_SITE_OTHER): Payer: Medicaid Other | Admitting: Psychiatry

## 2018-04-17 DIAGNOSIS — F259 Schizoaffective disorder, unspecified: Secondary | ICD-10-CM

## 2018-04-17 MED ORDER — BENZTROPINE MESYLATE 0.5 MG PO TABS: 0.5000 mg | ORAL_TABLET | Freq: Every day | ORAL | 0 refills | Status: DC

## 2018-04-17 MED ORDER — ARIPIPRAZOLE 10 MG PO TABS: 10.0000 mg | ORAL_TABLET | Freq: Every day | ORAL | 0 refills | Status: DC

## 2018-04-17 MED ORDER — TRAZODONE HCL 50 MG PO TABS: ORAL_TABLET | ORAL | 0 refills | Status: DC

## 2018-04-17 NOTE — Progress Notes (Signed)
BH MD/PA/NP OP Progress Note  04/17/2018 10:13 AM Kenneth Welch  MRN:  [id number]  Chief Complaint: I moved back to live with my father.  My roommate was charging too much and that did not work out.  HPI: Kenneth Welch came for his follow-up appointment.  He moved back to his father because a roommate was charging too much.  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  He is taking his medication and denies any side effects.  He takes trazodone once in a while to help his sleep.  He has no tremors or shakes.  Recently he is seen in the emergency room for penile discharge and he had blood work including HIV and RPR.  He was given antibiotic and he is symptom-free.  Patient started a new relationship and is hoping to move [place] in the future.  He is very shy about his relationship and does not disclose the details.  He is sleeping good.  He denies any paranoia, hallucination, suicidal thoughts or any crying spells.  He has no side effects from the medication.  His energy level is good.  He has no tremors or shakes.  Patient denies drinking or using any illegal substances.  Visit Diagnosis:    ICD-10-CM   1. Schizoaffective disorder, unspecified type (HCC) F25.9 traZODone (DESYREL) 50 MG tablet    benztropine (COGENTIN) 0.5 MG tablet    ARIPiprazole (ABILIFY) 10 MG tablet    Past Psychiatric History: Reviewed. Patient has multiple psychiatric hospitalization due to paranoia, hallucination, psychosis and depression. In the past he has taken zyprexa which did not help.   Past Medical History: No past medical history on file. No past surgical history on file.  Family Psychiatric History: Reviewed.  Family History: No family history on file.  Social History:  Social History   Socioeconomic History  . Marital status: Single    Spouse name: Not on file  . Number of children: Not on file  . Years of education: Not on file  . Highest education level: Not on file   Occupational History  . Not on file  Social Needs  . Financial resource strain: Not on file  . Food insecurity:    Worry: Not on file    Inability: Not on file  . Transportation needs:    Medical: Not on file    Non-medical: Not on file  Tobacco Use  . Smoking status: Never Smoker  . Smokeless tobacco: Never Used  Substance and Sexual Activity  . Alcohol use: No    Alcohol/week: 0.0 oz  . Drug use: No  . Sexual activity: Never  Lifestyle  . Physical activity:    Days per week: Not on file    Minutes per session: Not on file  . Stress: Not on file  Relationships  . Social connections:    Talks on phone: Not on file    Gets together: Not on file    Attends religious service: Not on file    Active member of club or organization: Not on file    Attends meetings of clubs or organizations: Not on file    Relationship status: Not on file  Other Topics Concern  . Not on file  Social History Narrative  . Not on file    Allergies: No Known Allergies  Metabolic Disorder Labs: Recent Results (from the past 2160 hour(s))  Urine cytology ancillary only     Status: Abnormal   Collection Time:  03/28/18 12:00 AM  Result Value Ref Range   Chlamydia Negative     Comment: Normal Reference Range - Negative   Neisseria gonorrhea **POSITIVE** (A)     Comment: Normal Reference Range - Negative   Trichomonas Negative     Comment: Normal Reference Range - Negative  POCT urinalysis dip (device)     Status: Abnormal   Collection Time: 03/28/18 12:25 PM  Result Value Ref Range   Glucose, UA NEGATIVE NEGATIVE mg/dL   Bilirubin Urine SMALL (A) NEGATIVE   Ketones, ur NEGATIVE NEGATIVE mg/dL   Specific Gravity, Urine >=1.030 1.005 - 1.030   Hgb urine dipstick TRACE (A) NEGATIVE   pH 7.0 5.0 - 8.0   Protein, ur 100 (A) NEGATIVE mg/dL   Urobilinogen, UA 1.0 0.0 - 1.0 mg/dL   Nitrite NEGATIVE NEGATIVE   Leukocytes, UA LARGE (A) NEGATIVE    Comment: Biochemical Testing Only. Please order  routine urinalysis from main lab if confirmatory testing is needed.  Urine culture     Status: None   Collection Time: 03/28/18 12:45 PM  Result Value Ref Range   Specimen Description URINE, RANDOM    Special Requests NONE    Culture      NO GROWTH Performed at [facility], [address], [place], [place] [address]    Report Status 03/29/2018 FINAL   HIV antibody (routine testing) (NOT for [facility])     Status: None   Collection Time: 03/28/18 12:46 PM  Result Value Ref Range   HIV Screen 4th Generation wRfx Non Reactive Non Reactive    Comment: (NOTE) Performed At: [facility] [address] [place], [place] [id number] Jolene Schimke MD [id number] Performed at [facility], [address], [place], [place] [address]   RPR     Status: None   Collection Time: 03/28/18 12:46 PM  Result Value Ref Range   RPR Ser Ql Non Reactive Non Reactive    Comment: (NOTE) Performed At: [facility] [address] [place], [place] [id number] Jolene Schimke MD [id number] Performed at [facility], [address], [place], [place] [address]    Lab Results  Component Value Date   HGBA1C 5.6 09/20/2017   MPG 120 03/28/2016   MPG 123 (H) 01/09/2015   No results found for: PROLACTIN Lab Results  Component Value Date   CHOL 199 01/09/2015   TRIG 103 01/09/2015   HDL 39 (L) 01/09/2015   CHOLHDL 5.1 01/09/2015   VLDL 21 01/09/2015   LDLCALC 139 (H) 01/09/2015   LDLCALC 128 (H) 12/13/2013   Lab Results  Component Value Date   TSH 2.080 09/20/2017   TSH 2.70 03/28/2016    Therapeutic Level Labs: No results found for: LITHIUM No results found for: VALPROATE No components found for:  CBMZ  Current Medications: Current Outpatient Medications  Medication Sig Dispense Refill  . ARIPiprazole (ABILIFY) 10 MG tablet Take 1 tablet (10 mg total) by mouth daily. 30 tablet 2  . benztropine (COGENTIN) 0.5 MG tablet Take 1 tablet (0.5 mg total) by mouth  at bedtime. 30 tablet 2  . cetirizine (ZYRTEC) 10 MG tablet TAKE 1 TABLET BY MOUTH ONCE EVERY EVENING  5  . Homeopathic Products (MENTAL CLARITY IN) Inhale into the lungs.    . Multiple Vitamin (MULTIVITAMIN WITH MINERALS) TABS tablet Take 1 tablet by mouth daily.    Marland Kitchen PROAIR HFA 108 (90 Base) MCG/ACT inhaler INHALE 2 PUFFS INTO THE LUNGS EVERY 4 HOURS AS  NEEDED  5  . traZODone (DESYREL) 50 MG tablet TAKE 1 TABLET (50 MG TOTAL) BY MOUTH AS NEEDED AT BEDTIME. 30 tablet 2   No current facility-administered medications for this visit.      Musculoskeletal: Strength & Muscle Tone: within normal limits Gait & Station: normal Patient leans: N/A  Psychiatric Specialty Exam: ROS  Blood pressure 122/70, pulse 83, height  (1.854 m), weight 202 lb 9.6 oz (91.9 kg).There is no height or weight on file to calculate BMI.  General Appearance: Casual and shy  Eye Contact:  Fair  Speech:  Slow  Volume:  Decreased  Mood:  Anxious  Affect:  Restricted  Thought Process:  Goal Directed  Orientation:  Full (Time, Place, and Person)  Thought Content: poverty of thought content   Suicidal Thoughts:  No  Homicidal Thoughts:  No  Memory:  Immediate;   Fair Recent;   Fair Remote;   Fair  Judgement:  Good  Insight:  Good  Psychomotor Activity:  Decreased  Concentration:  Concentration: Fair and Attention Span: Fair  Recall:  Good  Fund of Knowledge: Fair  Language: Good  Akathisia:  No  Handed:  Right  AIMS (if indicated): not done  Assets:  Communication Skills Desire for Improvement Housing  ADL's:  Intact  Cognition: WNL  Sleep:  Good   Screenings:   Assessment and Plan: Schizoaffective disorder, type of the time.  I reviewed records from the emergency room.  Patient is a stable on his current medication.  We discussed that if he moved to [place] that he need to find a psychiatrist so he can continue his medication.  I also mention if he had a provider then he should let us know  so we can send the records to his new provider.  Patient told that if he moved to [facility] but he will call us.  I will continue Abilify 10 mg daily, Cogentin 0.5 mg at bedtime and trazodone 50 mg as needed for insomnia.  Discussed medication side effects and benefits.  Recommended to call us back if he has any question, concern if you feel worsening of the symptoms.  Follow-up in 3 months.   Cleotis Nipper, MD 04/17/2018, 10:13 AM

## 2018-07-17 ENCOUNTER — Ambulatory Visit (HOSPITAL_COMMUNITY): Payer: Self-pay | Admitting: Psychiatry

## 2018-07-23 ENCOUNTER — Other Ambulatory Visit (HOSPITAL_COMMUNITY): Payer: Self-pay | Admitting: Psychiatry

## 2018-07-23 DIAGNOSIS — F259 Schizoaffective disorder, unspecified: Secondary | ICD-10-CM

## 2018-08-02 ENCOUNTER — Other Ambulatory Visit (HOSPITAL_COMMUNITY): Payer: Self-pay

## 2018-08-02 DIAGNOSIS — F259 Schizoaffective disorder, unspecified: Secondary | ICD-10-CM

## 2018-08-02 MED ORDER — ARIPIPRAZOLE 10 MG PO TABS: 10.0000 mg | ORAL_TABLET | Freq: Every day | ORAL | 0 refills | Status: DC

## 2018-09-11 ENCOUNTER — Ambulatory Visit (HOSPITAL_COMMUNITY): Payer: Self-pay | Admitting: Psychiatry

## 2018-09-24 ENCOUNTER — Other Ambulatory Visit (HOSPITAL_COMMUNITY): Payer: Self-pay | Admitting: Psychiatry

## 2018-09-24 DIAGNOSIS — F259 Schizoaffective disorder, unspecified: Secondary | ICD-10-CM

## 2018-09-28 ENCOUNTER — Other Ambulatory Visit (HOSPITAL_COMMUNITY): Payer: Self-pay | Admitting: Psychiatry

## 2018-09-28 DIAGNOSIS — F259 Schizoaffective disorder, unspecified: Secondary | ICD-10-CM

## 2018-11-26 ENCOUNTER — Ambulatory Visit (HOSPITAL_COMMUNITY)
Admission: EM | Admit: 2018-11-26 | Discharge: 2018-11-26 | Disposition: A | Discharge status: Home / Self Care | Payer: Medicaid Other | Attending: Family Medicine | Admitting: Family Medicine

## 2018-11-26 ENCOUNTER — Encounter (HOSPITAL_COMMUNITY): Payer: Self-pay

## 2018-11-26 ENCOUNTER — Other Ambulatory Visit: Payer: Self-pay

## 2018-11-26 DIAGNOSIS — R369 Urethral discharge, unspecified: Secondary | ICD-10-CM | POA: Diagnosis present

## 2018-11-26 DIAGNOSIS — Z113 Encounter for screening for infections with a predominantly sexual mode of transmission: Secondary | ICD-10-CM | POA: Diagnosis not present

## 2018-11-26 DIAGNOSIS — Z7251 High risk heterosexual behavior: Secondary | ICD-10-CM

## 2018-11-26 LAB — POCT URINALYSIS DIP (DEVICE)
Bilirubin Urine: NEGATIVE
Glucose, UA: NEGATIVE mg/dL
Hgb urine dipstick: NEGATIVE
Ketones, ur: NEGATIVE mg/dL
Nitrite: NEGATIVE
Protein, ur: NEGATIVE mg/dL
Specific Gravity, Urine: 1.02 (ref 1.005–1.030)
Urobilinogen, UA: 0.2 mg/dL (ref 0.0–1.0)
pH: 7.5 (ref 5.0–8.0)

## 2018-11-26 MED ORDER — AZITHROMYCIN 250 MG PO TABS
1000.0000 mg | ORAL_TABLET | Freq: Once | ORAL | Status: AC
  Administered 2018-11-26: 1000 mg via ORAL

## 2018-11-26 MED ORDER — CEFTRIAXONE SODIUM 250 MG IJ SOLR
INTRAMUSCULAR | Status: AC
  Filled 2018-11-26: qty 250

## 2018-11-26 MED ORDER — AZITHROMYCIN 250 MG PO TABS
ORAL_TABLET | ORAL | Status: AC
  Filled 2018-11-26: qty 4

## 2018-11-26 MED ORDER — CEFTRIAXONE SODIUM 250 MG IJ SOLR
250.0000 mg | Freq: Once | INTRAMUSCULAR | Status: AC
  Administered 2018-11-26: 250 mg via INTRAMUSCULAR

## 2018-11-26 MED ORDER — LIDOCAINE HCL (PF) 1 % IJ SOLN
INTRAMUSCULAR | Status: AC
  Filled 2018-11-26: qty 2

## 2018-11-26 NOTE — ED Provider Notes (Signed)
[facility]   [id number] 11/26/18 Arrival Time: 1037  ASSESSMENT & PLAN:  1. Penile discharge   2. High risk heterosexual behavior    Declines HIV/RPR.   Discharge Instructions     You have been given the following medications today for treatment of suspected gonorrhea and/or chlamydia:  cefTRIAXone (ROCEPHIN) injection 250 mg azithromycin (ZITHROMAX) tablet 1,000 mg  Even though we have treated you today, we have sent testing for sexually transmitted infections. We will notify you of any positive results once they are received. If required, we will prescribe any medications you might need.  Please refrain from all sexual activity for at least the next seven days.    Pending: Labs Reviewed  POCT URINALYSIS DIP (DEVICE) - Abnormal; Notable for the following components:      Result Value   Leukocytes, UA SMALL (*)    All other components within normal limits  URINE CYTOLOGY ANCILLARY ONLY   Will notify of any positive results. Instructed to refrain from sexual activity for at least seven days.  Reviewed expectations re: course of current medical issues. Questions answered. Outlined signs and symptoms indicating need for more acute intervention. Patient verbalized understanding. After Visit Summary given.   SUBJECTIVE:  Kenneth Welch is a 32 y.o. male who presents with complaint of penile discharge. Onset abrupt, a few days ago. Describes discharge as thick and white/yellow. Urinary symptoms: none. Afebrile. No abdominal or pelvic pain. No n/v. No rashes or lesions. Sexually active with multiple (at least 3 in the past 1-2 months) male partners; occasional condom use. OTC treatment: none reported. History of STI: reports h/o treated gonorrhea.  ROS: As per HPI. All other systems negative.   OBJECTIVE:  Vitals:   11/26/18 1227 11/26/18 1228  BP:  120/69  Pulse:  73  Resp:  18  Temp:  97.9 F (36.6 C)  TempSrc:  Oral  SpO2:  100%  Weight:  81.6 kg     General appearance: alert, cooperative, appears stated age and no distress Throat: lips, mucosa, and tongue normal; teeth and gums normal; no oral lesions Cv: RRR Lungs: CTAB Back: no CVA tenderness; FROM at waist Abdomen: soft, non-tender; no inguinal pain to palpation GU: deferred Skin: warm and dry Psychological: alert and cooperative; normal mood and affect  Results for orders placed or performed during the hospital encounter of 11/26/18  POCT urinalysis dip (device)  Result Value Ref Range   Glucose, UA NEGATIVE NEGATIVE mg/dL   Bilirubin Urine NEGATIVE NEGATIVE   Ketones, ur NEGATIVE NEGATIVE mg/dL   Specific Gravity, Urine 1.020 1.005 - 1.030   Hgb urine dipstick NEGATIVE NEGATIVE   pH 7.5 5.0 - 8.0   Protein, ur NEGATIVE NEGATIVE mg/dL   Urobilinogen, UA 0.2 0.0 - 1.0 mg/dL   Nitrite NEGATIVE NEGATIVE   Leukocytes, UA SMALL (A) NEGATIVE    Labs Reviewed  POCT URINALYSIS DIP (DEVICE) - Abnormal; Notable for the following components:      Result Value   Leukocytes, UA SMALL (*)    All other components within normal limits  URINE CYTOLOGY ANCILLARY ONLY    No Known Allergies  PMH: As in HPI.  Social History   Socioeconomic History  . Marital status: Single    Spouse name: Not on file  . Number of children: Not on file  . Years of education: Not on file  . Highest education level: Not on file  Occupational History  . Not on file  Social Needs  .  Financial resource strain: Not on file  . Food insecurity:    Worry: Not on file    Inability: Not on file  . Transportation needs:    Medical: Not on file    Non-medical: Not on file  Tobacco Use  . Smoking status: Never Smoker  . Smokeless tobacco: Never Used  Substance and Sexual Activity  . Alcohol use: No    Alcohol/week: 0.0 standard drinks  . Drug use: No  . Sexual activity: Never  Lifestyle  . Physical activity:    Days per week: Not on file    Minutes per session: Not on file  .  Stress: Not on file  Relationships  . Social connections:    Talks on phone: Not on file    Gets together: Not on file    Attends religious service: Not on file    Active member of club or organization: Not on file    Attends meetings of clubs or organizations: Not on file    Relationship status: Not on file  . Intimate partner violence:    Fear of current or ex partner: Not on file    Emotionally abused: Not on file    Physically abused: Not on file    Forced sexual activity: Not on file  Other Topics Concern  . Not on file  Social History Narrative  . Not on file          Mardella LaymanHagler, Tekoa Amon, MD 11/26/18 1259

## 2018-11-26 NOTE — ED Triage Notes (Signed)
Pt cc he has a STD he states he has penile discharge this started 2 days ago.

## 2018-11-26 NOTE — Discharge Instructions (Signed)

## 2018-11-27 LAB — URINE CYTOLOGY ANCILLARY ONLY
Chlamydia: POSITIVE — AB
Neisseria Gonorrhea: POSITIVE — AB
Trichomonas: NEGATIVE

## 2018-11-29 ENCOUNTER — Telehealth (HOSPITAL_COMMUNITY): Payer: Self-pay | Admitting: Emergency Medicine

## 2018-11-29 NOTE — Telephone Encounter (Signed)
Chlamydia is positive.  This was treated at the urgent care visit with po zithromax 1g.  Pt needs education to please refrain from sexual intercourse for 7 days to give the medicine time to work.  Sexual partners need to be notified and tested/treated.  Condoms may reduce risk of reinfection.  Recheck or followup with PCP for further evaluation if symptoms are not improving.  [facility] notified.  Test for gonorrhea was positive. This was treated at the urgent care visit with IM rocephin 250mg and po zithromax 1g. Pt needs education to refrain from sexual intercourse for 7 days after treatment to give the medicine time to work. Sexual partners need to be notified and tested/treated. Condoms may reduce risk of reinfection. Recheck or followup with PCP for further evaluation if symptoms are not improving. [facility] notified.   

## 2019-01-06 ENCOUNTER — Other Ambulatory Visit (HOSPITAL_COMMUNITY): Payer: Self-pay | Admitting: Psychiatry

## 2019-01-06 DIAGNOSIS — F259 Schizoaffective disorder, unspecified: Secondary | ICD-10-CM

## 2019-01-10 ENCOUNTER — Ambulatory Visit (HOSPITAL_COMMUNITY): Payer: Medicaid Other | Admitting: Psychiatry

## 2019-01-10 ENCOUNTER — Encounter

## 2019-01-18 ENCOUNTER — Ambulatory Visit (HOSPITAL_COMMUNITY): Payer: Medicaid Other | Admitting: Psychiatry

## 2019-01-18 ENCOUNTER — Encounter (HOSPITAL_COMMUNITY): Payer: Self-pay | Admitting: Psychiatry

## 2019-01-18 ENCOUNTER — Ambulatory Visit (INDEPENDENT_AMBULATORY_CARE_PROVIDER_SITE_OTHER): Payer: Medicaid Other | Admitting: Psychiatry

## 2019-01-18 VITALS — BP 112/68 | Ht 74.0 in | Wt 181.0 lb

## 2019-01-18 DIAGNOSIS — F259 Schizoaffective disorder, unspecified: Secondary | ICD-10-CM | POA: Diagnosis not present

## 2019-01-18 DIAGNOSIS — Z79899 Other long term (current) drug therapy: Secondary | ICD-10-CM

## 2019-01-18 MED ORDER — ARIPIPRAZOLE 10 MG PO TABS: 10.0000 mg | ORAL_TABLET | Freq: Every day | ORAL | 0 refills | Status: DC

## 2019-01-18 MED ORDER — BENZTROPINE MESYLATE 0.5 MG PO TABS: 0.5000 mg | ORAL_TABLET | Freq: Every day | ORAL | 0 refills | Status: DC
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
## 2019-01-19 LAB — COMPREHENSIVE METABOLIC PANEL
ALT: 16 IU/L (ref 0–44)
AST: 21 IU/L (ref 0–40)
Albumin/Globulin Ratio: 2.1 (ref 1.2–2.2)
Albumin: 4.6 g/dL (ref 4.0–5.0)
Alkaline Phosphatase: 59 IU/L (ref 39–117)
BUN/Creatinine Ratio: 10 (ref 9–20)
BUN: 11 mg/dL (ref 6–20)
Bilirubin Total: 0.3 mg/dL (ref 0.0–1.2)
CO2: 23 mmol/L (ref 20–29)
Calcium: 9.4 mg/dL (ref 8.7–10.2)
Chloride: 102 mmol/L (ref 96–106)
Creatinine, Ser: 1.1 mg/dL (ref 0.76–1.27)
GFR calc Af Amer: 103 mL/min/{1.73_m2} (ref >59)
GFR calc non Af Amer: 89 mL/min/{1.73_m2} (ref >59)
Globulin, Total: 2.2 g/dL (ref 1.5–4.5)
Glucose: 82 mg/dL (ref 65–99)
Potassium: 4.3 mmol/L (ref 3.5–5.2)
Sodium: 140 mmol/L (ref 134–144)
Total Protein: 6.8 g/dL (ref 6.0–8.5)

## 2019-01-19 LAB — CBC WITH DIFFERENTIAL/PLATELET
Basophils Absolute: 0 10*3/uL (ref 0.0–0.2)
Basos: 0 %
EOS (ABSOLUTE): 0 10*3/uL (ref 0.0–0.4)
Eos: 1 %
Hematocrit: 43.6 % (ref 37.5–51.0)
Hemoglobin: 14.9 g/dL (ref 13.0–17.7)
Immature Grans (Abs): 0 10*3/uL (ref 0.0–0.1)
Immature Granulocytes: 0 %
Lymphocytes Absolute: 2.1 10*3/uL (ref 0.7–3.1)
Lymphs: 31 %
MCH: 31.3 pg (ref 26.6–33.0)
MCHC: 34.2 g/dL (ref 31.5–35.7)
MCV: 92 fL (ref 79–97)
Monocytes Absolute: 0.5 10*3/uL (ref 0.1–0.9)
Monocytes: 7 %
Neutrophils Absolute: 4.1 10*3/uL (ref 1.4–7.0)
Neutrophils: 61 %
Platelets: 197 10*3/uL (ref 150–450)
RBC: 4.76 x10E6/uL (ref 4.14–5.80)
RDW: 14.4 % (ref 11.6–15.4)
WBC: 6.8 10*3/uL (ref 3.4–10.8)

## 2019-01-19 LAB — HEMOGLOBIN A1C
Est. average glucose Bld gHb Est-mCnc: 123 mg/dL
Hgb A1c MFr Bld: 5.9 % — ABNORMAL HIGH (ref 4.8–5.6)

## 2019-02-17 ENCOUNTER — Other Ambulatory Visit (HOSPITAL_COMMUNITY): Payer: Self-pay | Admitting: Psychiatry

## 2019-02-17 DIAGNOSIS — F259 Schizoaffective disorder, unspecified: Secondary | ICD-10-CM

## 2019-03-07 ENCOUNTER — Encounter (HOSPITAL_COMMUNITY): Payer: Self-pay | Admitting: Emergency Medicine

## 2019-03-07 ENCOUNTER — Emergency Department (HOSPITAL_COMMUNITY)
Admission: EM | Admit: 2019-03-07 | Discharge: 2019-03-07 | Disposition: A | Discharge status: Home / Self Care | Payer: Medicaid Other | Attending: Emergency Medicine | Admitting: Emergency Medicine

## 2019-03-07 ENCOUNTER — Other Ambulatory Visit: Payer: Self-pay

## 2019-03-07 ENCOUNTER — Emergency Department (HOSPITAL_COMMUNITY): Payer: Medicaid Other

## 2019-03-07 DIAGNOSIS — Z79899 Other long term (current) drug therapy: Secondary | ICD-10-CM | POA: Insufficient documentation

## 2019-03-07 DIAGNOSIS — J4521 Mild intermittent asthma with (acute) exacerbation: Secondary | ICD-10-CM | POA: Insufficient documentation

## 2019-03-07 DIAGNOSIS — Z760 Encounter for issue of repeat prescription: Secondary | ICD-10-CM

## 2019-03-07 DIAGNOSIS — R05 Cough: Secondary | ICD-10-CM | POA: Diagnosis present

## 2019-03-07 HISTORY — DX: Unspecified asthma, uncomplicated: J45.909

## 2019-03-07 MED ORDER — ALBUTEROL SULFATE HFA 108 (90 BASE) MCG/ACT IN AERS: 1.0000 | INHALATION_SPRAY | Freq: Four times a day (QID) | RESPIRATORY_TRACT | 0 refills | Status: DC | PRN

## 2019-03-07 MED ORDER — ALBUTEROL SULFATE HFA 108 (90 BASE) MCG/ACT IN AERS
1.0000 | INHALATION_SPRAY | Freq: Once | RESPIRATORY_TRACT | Status: AC
Start: 2019-03-07 — End: 2019-03-07
  Administered 2019-03-07: 1 via RESPIRATORY_TRACT
  Filled 2019-03-07: qty 6.7

## 2019-03-07 NOTE — ED Notes (Signed)
Patient verbalizes understanding of discharge instructions . Opportunity for questions and answers were provided . Armband removed by staff ,Pt discharged from ED. W/C  offered at D/C  and Declined W/C at D/C and was escorted to lobby by RN.  

## 2019-03-07 NOTE — ED Triage Notes (Signed)
Pt reports being out of his inhaler and having tightness in chest with a dry cough. Pt denies any fever or sob.

## 2019-03-07 NOTE — ED Provider Notes (Signed)
[facility] EMERGENCY DEPARTMENT Provider Note   CSN: [id number] Arrival date & time: 03/07/19  1244  History   Chief Complaint Chief Complaint  Patient presents with  . Asthma    HPI Kenneth Welch is a 33 y.o. male with past medical history significant for asthma who presents for evaluation of medication refill.  Patient states he has had chest tightness as well as a dry cough since he has been out of his inhaler x4 days.  Patient states he does have refills on this, however cannot afford to pick up his medication.  States he has had some mild shortness of breath, however states this is not worse and "it normally is."  Patient states he has had a nonproductive cough x1 week.  Denies fever, chills, nausea, vomiting, chest pain, abdominal pain, diarrhea dysuria. Denies recent travel or known contacts with coronavirus positive patients. She states he normally has frequent asthma exacerbations during the change of seasons. Patient states he has had some mild rhinorrhea, however states he has not been taking his Zyrtec as he cannot afford this.  Denies any additional aggravating or alleviating factors.  Has not taken anything over the last 24 hours for symptoms PTA.  States he has been taking his schizophrenia medications, Abilify, and trazodone.  Admits to previous hospitalization for asthma when he was a child.  No ICU or previous intubations.  Last steroid use "years ago."   History obtained from patient.  No interpreter was used.     HPI  Past Medical History:  Diagnosis Date  . Asthma     Patient Active Problem List   Diagnosis Date Noted  . Schizoaffective disorder, unspecified condition 04/10/2012    History reviewed. No pertinent surgical history.      Home Medications    Prior to Admission medications   Medication Sig Start Date End Date Taking? Authorizing Provider  albuterol (PROVENTIL HFA;VENTOLIN HFA) 108 (90 Base) MCG/ACT inhaler Inhale 1-2  puffs into the lungs every 6 (six) hours as needed for wheezing or shortness of breath. 03/07/19   Wilberth Damon A, PA-C  ARIPiprazole (ABILIFY) 10 MG tablet Take 1 tablet (10 mg total) by mouth daily. 01/18/19   Arfeen, Phillips Grout, MD  benztropine (COGENTIN) 0.5 MG tablet TAKE 1 TABLET (0.5 MG TOTAL) BY MOUTH AT BEDTIME. 02/25/19   Arfeen, Phillips Grout, MD  cetirizine (ZYRTEC) 10 MG tablet TAKE 1 TABLET BY MOUTH ONCE EVERY EVENING 08/26/17   [provider]  Homeopathic Products (MENTAL CLARITY IN) Inhale into the lungs.    [provider]  Multiple Vitamin (MULTIVITAMIN WITH MINERALS) TABS tablet Take 1 tablet by mouth daily.    [provider]  traZODone (DESYREL) 50 MG tablet TAKE 1 TABLET (50 MG TOTAL) BY MOUTH AS NEEDED AT BEDTIME. 04/17/18   Arfeen, Phillips Grout, MD    Family History No family history on file.  Social History Social History   Tobacco Use  . Smoking status: Never Smoker  . Smokeless tobacco: Never Used  Substance Use Topics  . Alcohol use: No    Alcohol/week: 0.0 standard drinks  . Drug use: No     Allergies   Patient has no known allergies.   Review of Systems Review of Systems  Constitutional: Negative.   HENT: Positive for rhinorrhea. Negative for congestion, postnasal drip, sinus pressure, sinus pain, sore throat and trouble swallowing.   Respiratory: Positive for cough, chest tightness and wheezing. Negative for apnea, choking, shortness of breath  and stridor.   Cardiovascular: Negative.   Gastrointestinal: Negative.   Genitourinary: Negative.   Musculoskeletal: Negative.   Skin: Negative.   Neurological: Negative.   All other systems reviewed and are negative.    Physical Exam Updated Vital Signs BP 111/65   Pulse 90   Temp 98 F (36.7 C) (Oral)   Resp 19   SpO2 98%   Physical Exam Vitals signs and nursing note reviewed.  Constitutional:      General: He is not in acute distress.    Appearance: He is well-developed. He is  not ill-appearing, toxic-appearing or diaphoretic.  HENT:     Head: Normocephalic and atraumatic.     Nose:     Comments: No congestion or rhinorrhea.  No sinus tenderness.    Mouth/Throat:     Comments: Oropharynx clear.  Mucous membranes moist.  Uvula midline without deviation.  No drooling, dysphasia or trismus. Eyes:     Pupils: Pupils are equal, round, and reactive to light.  Neck:     Musculoskeletal: Normal range of motion and neck supple.     Comments: No neck stiffness or neck rigidity, no meningismus.  Phonation normal.  No cervical lymphadenopathy. Cardiovascular:     Rate and Rhythm: Normal rate and regular rhythm.     Pulses: Normal pulses.     Heart sounds: Normal heart sounds.  Pulmonary:     Effort: Pulmonary effort is normal. No respiratory distress.     Comments: Very mild diffuse expiratory wheezing.  No rhonchi or rales.  Able speak in full sentences without difficulty.  No tachypnea.  No assesory muscle usage. Abdominal:     General: There is no distension.     Palpations: Abdomen is soft.  Musculoskeletal: Normal range of motion.  Skin:    General: Skin is warm and dry.  Neurological:     Mental Status: He is alert.    ED Treatments / Results  Labs (all labs ordered are listed, but only abnormal results are displayed) Labs Reviewed - No data to display  EKG EKG Interpretation  Date/Time:  Thursday March 07 2019 13:53:49 EDT Ventricular Rate:  98 PR Interval:    QRS Duration: 85 QT Interval:  329 QTC Calculation: 420 R Axis:   -5 Text Interpretation:  Age not entered, assumed to be  33 years old for purpose of ECG interpretation Sinus rhythm LAE, consider biatrial enlargement RSR' in V1 or V2, right VCD or RVH Abnormal ekg Confirmed by Gerhard Munch [phone]) on 03/07/2019 1:58:54 PM   Radiology Dg Chest 2 View  Result Date: 03/07/2019 CLINICAL DATA:  Sharp pain for couple of days.  Cough. EXAM: CHEST - 2 VIEW COMPARISON:  09/29/2013 FINDINGS: The  heart size and mediastinal contours are within normal limits. Both lungs are clear. The visualized skeletal structures are unremarkable. IMPRESSION: No active cardiopulmonary disease. Electronically Signed   By: Elige Ko   On: 03/07/2019 13:49    Procedures Procedures (including critical care time)  Medications Ordered in ED Medications  albuterol (PROVENTIL HFA;VENTOLIN HFA) 108 (90 Base) MCG/ACT inhaler 1 puff (1 puff Inhalation Given 03/07/19 1333)     Initial Impression / Assessment and Plan / ED Course  I have reviewed the triage vital signs and the nursing notes.  Pertinent labs & imaging results that were available during my care of the patient were reviewed by me and considered in my medical decision making (see chart for details).  33 year old male appears otherwise well presents for  evaluation of asthma.  Has been out of his albuterol inhaler.  Has had dry cough as well as chest tightness over the last 4 days since he has been out of this.  Did have some clear rhinorrhea without congestion.  No known sick contacts.  Afebrile, nonseptic, non-ill-appearing.  Has not been able to refill his albuterol inhaler because he states he cannot afford this.  Very mild diffuse expiratory wheezing on exam.  Able speak in full sentences without difficulty.  No tachypnea or hypoxia.  No accessory muscle usage.  Will give patient home albuterol treatment as well as chest x-ray and EKG and reevaluate.  Heart score 0, PERC, Wells criteria negative.  Low suspicion for atypical ACS or PE as cause of patient's chest tightness.  Likely related to acute asthma. Chest tightness non exertional, without radiation or associated diaphoresis, lightheadedness, dizziness, N/V.  Chest x-ray negative for infiltrates, pulmonary edema, cardiomegaly or pneumothorax.  EKG without ST changes. Tightness likely related to asthma exacerbation. Has not recently been on steroids.  Breath sounds clear after home albuterol  inhaler.  Will have patient sent home with inhaler.  Hemodynamically stable and appropriate for DC at this time. He was able to ambulate in halls with oxygen saturation greater than 95%.  No acute dyspnea. Pt states they are breathing at baseline. Pt has been instructed to continue using prescribed medications and to speak with PCP about today's exacerbation.  Dicsussed strict return precautions.  Patient voiced understanding and is agreeable to follow-up.     Final Clinical Impressions(s) / ED Diagnoses   Final diagnoses:  Mild intermittent asthma with exacerbation  Medication refill    ED Discharge Orders         Ordered    albuterol (PROVENTIL HFA;VENTOLIN HFA) 108 (90 Base) MCG/ACT inhaler  Every 6 hours PRN     03/07/19 1428           Dvonte Gatliff A, PA-C 03/07/19 1443    Benjiman Core, MD 03/08/19 1134

## 2019-03-07 NOTE — Discharge Instructions (Signed)
Evaluated today for asthma.  We have given you inhaler in department.  I have also given you a prescription sent to the pharmacy.  Return to the ED for any new or worsening symptoms.

## 2019-03-13 ENCOUNTER — Emergency Department (HOSPITAL_COMMUNITY)
Admission: EM | Admit: 2019-03-13 | Discharge: 2019-03-13 | Disposition: A | Discharge status: Home / Self Care | Payer: Medicaid Other | Attending: Emergency Medicine | Admitting: Emergency Medicine

## 2019-03-13 ENCOUNTER — Other Ambulatory Visit: Payer: Self-pay

## 2019-03-13 ENCOUNTER — Encounter (HOSPITAL_COMMUNITY): Payer: Self-pay | Admitting: Emergency Medicine

## 2019-03-13 DIAGNOSIS — J45909 Unspecified asthma, uncomplicated: Secondary | ICD-10-CM | POA: Insufficient documentation

## 2019-03-13 DIAGNOSIS — Z79899 Other long term (current) drug therapy: Secondary | ICD-10-CM | POA: Diagnosis not present

## 2019-03-13 DIAGNOSIS — R21 Rash and other nonspecific skin eruption: Secondary | ICD-10-CM | POA: Diagnosis present

## 2019-03-13 MED ORDER — PENICILLIN G BENZATHINE 1200000 UNIT/2ML IM SUSP
2.4000 10*6.[IU] | Freq: Once | INTRAMUSCULAR | Status: AC
  Administered 2019-03-13: 2.4 10*6.[IU] via INTRAMUSCULAR
  Filled 2019-03-13: qty 4

## 2019-03-13 NOTE — ED Notes (Signed)
Patient verbalizes understanding of discharge instructions. Opportunity for questioning and answers were provided. Armband removed by staff, pt discharged from ED.  

## 2019-03-13 NOTE — ED Triage Notes (Signed)
Pt. Stated, Kenneth Welch had a rash for a week, I had it before about a year ago.

## 2019-03-13 NOTE — Discharge Instructions (Signed)
Please read attached information. If you experience any new or worsening signs or symptoms please return to the emergency room for evaluation. Please follow-up with [facility] department as discussed.  Please follow-up with your primary care as discussed.. Please use medication prescribed only as directed and discontinue taking if you have any concerning signs or symptoms.

## 2019-03-13 NOTE — ED Provider Notes (Signed)
[facility] Provider Note   CSN: [id number] Arrival date & time: 03/13/19  1057    History   Chief Complaint Chief Complaint  Patient presents with  . Rash    HPI Kenneth Welch is a 33 y.o. male.     HPI   33 year old male presents today with complaints of rash.  Patient's proximally 2 weeks ago developed rash to his groin trunk and upper extremities.  He notes this is slightly itchy.  He denies any associated fever, nausea or vomiting.  He denies any close contacts with similar symptoms.  Patient denies any large lesions on his genitalia and notes that the rash did not originate from the genitalia.  He reports he is sexually active with male partners.  He denies any new exposures.  No medications prior to arrival.  He does report that in the past he had a similar rash that he put hydrocortisone cream on that helped his symptoms.  Denies any oral involvement.  Past Medical History:  Diagnosis Date  . Asthma     Patient Active Problem List   Diagnosis Date Noted  . Schizoaffective disorder, unspecified condition 04/10/2012    History reviewed. No pertinent surgical history.      Home Medications    Prior to Admission medications   Medication Sig Start Date End Date Taking? Authorizing Provider  albuterol (PROVENTIL HFA;VENTOLIN HFA) 108 (90 Base) MCG/ACT inhaler Inhale 1-2 puffs into the lungs every 6 (six) hours as needed for wheezing or shortness of breath. 03/07/19   Henderly, Britni A, PA-C  ARIPiprazole (ABILIFY) 10 MG tablet Take 1 tablet (10 mg total) by mouth daily. 01/18/19   Arfeen, Phillips GroutSyed T, MD  benztropine (COGENTIN) 0.5 MG tablet TAKE 1 TABLET (0.5 MG TOTAL) BY MOUTH AT BEDTIME. 02/25/19   Arfeen, Phillips GroutSyed T, MD  cetirizine (ZYRTEC) 10 MG tablet TAKE 1 TABLET BY MOUTH ONCE EVERY EVENING 08/26/17   [provider]  Homeopathic Products (MENTAL CLARITY IN) Inhale into the lungs.    [provider]  Multiple  Vitamin (MULTIVITAMIN WITH MINERALS) TABS tablet Take 1 tablet by mouth daily.    [provider]  traZODone (DESYREL) 50 MG tablet TAKE 1 TABLET (50 MG TOTAL) BY MOUTH AS NEEDED AT BEDTIME. 04/17/18   Arfeen, Phillips GroutSyed T, MD    Family History No family history on file.  Social History Social History   Tobacco Use  . Smoking status: Never Smoker  . Smokeless tobacco: Never Used  Substance Use Topics  . Alcohol use: No    Alcohol/week: 0.0 standard drinks  . Drug use: No     Allergies   Patient has no known allergies.   Review of Systems Review of Systems  All other systems reviewed and are negative.    Physical Exam Updated Vital Signs BP 128/79 (BP Location: Right Arm)   Pulse 86   Temp (!) 97.5 F (36.4 C) (Oral)   Resp 17   SpO2 99%   Physical Exam Vitals signs and nursing note reviewed.  Constitutional:      Appearance: He is well-developed.  HENT:     Head: Normocephalic and atraumatic.  Eyes:     General: No scleral icterus.       Right eye: No discharge.        Left eye: No discharge.     Conjunctiva/sclera: Conjunctivae normal.     Pupils: Pupils are equal, round, and reactive to light.  Comments: No conjunctival injection or discharge  Neck:     Musculoskeletal: Normal range of motion.     Vascular: No JVD.     Trachea: No tracheal deviation.  Pulmonary:     Effort: Pulmonary effort is normal.     Breath sounds: No stridor.  Genitourinary:    Comments: Circumcised penis with multiple papular lesions  Skin:    Comments: Papular rash noted to the neck trunk upper extremities and groin, no vesicular lesions, purulence or surrounding erythema, non-blanchable-palms are included, no intraoral involvement  Neurological:     Mental Status: He is alert and oriented to person, place, and time.     Coordination: Coordination normal.  Psychiatric:        Behavior: Behavior normal.        Thought Content: Thought content normal.        Judgment:  Judgment normal.      ED Treatments / Results  Labs (all labs ordered are listed, but only abnormal results are displayed) Labs Reviewed  RPR  HIV ANTIBODY (ROUTINE TESTING W REFLEX)    EKG None  Radiology No results found.  Procedures Procedures (including critical care time)  Medications Ordered in ED Medications  penicillin g benzathine (BICILLIN LA) 1200000 UNIT/2ML injection 2.4 Million Units (has no administration in time range)     Initial Impression / Assessment and Plan / ED Course  I have reviewed the triage vital signs and the nursing notes.  Pertinent labs & imaging results that were available during my care of the patient were reviewed by me and considered in my medical decision making (see chart for details).        Labs: RPR, HIV  Imaging:  Consults:  Therapeutics: Penicillin G  Discharge Meds:   Assessment/Plan: 33 year old male presents today with rash.  He is very well-appearing in no acute distress.  He is afebrile and has no signs of systemic illness or organ dysfunction.  Given his rash I do have suspicion for syphilis, he is high risk given his sexual history.  Patient will be given a dose of penicillin here, he will refer to the health department, HIV and RPR pending.  She is also encouraged to follow-up with his primary care within the next week if his symptoms continue to persist.  Patient given strict return precautions.  He verbalized understanding and agreement to today's plan.    Final Clinical Impressions(s) / ED Diagnoses   Final diagnoses:  Rash    ED Discharge Orders    None       Rosalio Loud 03/13/19 1115    Pricilla Loveless, MD 03/13/19 1119

## 2019-03-14 LAB — RPR: RPR Ser Ql: REACTIVE — AB

## 2019-03-14 LAB — RPR, QUANT+TP ABS (REFLEX)
Rapid Plasma Reagin, Quant: 1:32 {titer} — ABNORMAL HIGH
T Pallidum Abs: REACTIVE — AB

## 2019-03-14 LAB — HIV ANTIBODY (ROUTINE TESTING W REFLEX): HIV Screen 4th Generation wRfx: NONREACTIVE

## 2019-04-15 ENCOUNTER — Other Ambulatory Visit (HOSPITAL_COMMUNITY): Payer: Self-pay | Admitting: Psychiatry

## 2019-04-15 DIAGNOSIS — F259 Schizoaffective disorder, unspecified: Secondary | ICD-10-CM

## 2019-04-18 ENCOUNTER — Encounter (HOSPITAL_COMMUNITY): Payer: Self-pay | Admitting: Psychiatry

## 2019-04-18 ENCOUNTER — Other Ambulatory Visit: Payer: Self-pay

## 2019-04-18 ENCOUNTER — Ambulatory Visit (INDEPENDENT_AMBULATORY_CARE_PROVIDER_SITE_OTHER): Payer: Medicaid Other | Admitting: Psychiatry

## 2019-04-18 DIAGNOSIS — F259 Schizoaffective disorder, unspecified: Secondary | ICD-10-CM

## 2019-04-18 MED ORDER — ARIPIPRAZOLE 10 MG PO TABS: 10.0000 mg | ORAL_TABLET | Freq: Every day | ORAL | 0 refills | Status: DC

## 2019-04-18 MED ORDER — BENZTROPINE MESYLATE 0.5 MG PO TABS: 0.5000 mg | ORAL_TABLET | Freq: Every day | ORAL | 0 refills | Status: DC

## 2019-04-18 NOTE — Progress Notes (Signed)
Virtual Visit via Telephone Note  I connected with Kenneth Welch on 04/18/19 at 10:20 AM EDT by telephone and verified that I am speaking with the correct person using two identifiers.   I discussed the limitations, risks, security and privacy concerns of performing an evaluation and management service by telephone and the availability of in person appointments. I also discussed with the patient that there may be a patient responsible charge related to this service. The patient expressed understanding and agreed to proceed.   History of Present Illness: Patient was evaluated on the phone session.  Since taking the medication his paranoia and sleep is better.  Recently he had to visit to the emergency room because of penile rash.  He had a blood work and he was diagnosed with syphilis.  Patient told he received the injection.  Patient lives by himself and he is no longer have a partner.  He denies any hallucination, paranoia or any aggressive behavior.  Today he wanted to have DMV forms to be filled which requires statement from psychiatry.  He already filled the form from primary care physician but now BMP required psychiatrist to fill the remaining portion.  Recently patient have cough and is taking cough medicine.  Patient told he does not go outside unless important.  He is not drinking or using any illegal substances.  He has limited social network.  He like to get his refill.  He has no tremors or shakes.   Past Psychiatric History: Reviewed. H/O multiple psychiatric hospitalization due to paranoia, hallucination, psychosis and depression. Tried zyprexa that did not help.  Trazodone work but stopped as does not needed.  Recent Results (from the past 2160 hour(s))  Hemoglobin A1c     Status: Abnormal   Collection Time: 01/18/19 10:25 AM  Result Value Ref Range   Hgb A1c MFr Bld 5.9 (H) 4.8 - 5.6 %    Comment:          Prediabetes: 5.7 - 6.4          Diabetes: >6.4          Glycemic  control for adults with diabetes: <7.0    Est. average glucose Bld gHb Est-mCnc 123 mg/dL  Comprehensive metabolic panel     Status: None   Collection Time: 01/18/19 10:25 AM  Result Value Ref Range   Glucose 82 65 - 99 mg/dL   BUN 11 6 - 20 mg/dL   Creatinine, Ser 1.611.10 0.76 - 1.27 mg/dL   GFR calc non Af Amer 89 >59 mL/min/1.73   GFR calc Af Amer 103 >59 mL/min/1.73   BUN/Creatinine Ratio 10 9 - 20   Sodium 140 134 - 144 mmol/L   Potassium 4.3 3.5 - 5.2 mmol/L   Chloride 102 96 - 106 mmol/L   CO2 23 20 - 29 mmol/L   Calcium 9.4 8.7 - 10.2 mg/dL   Total Protein 6.8 6.0 - 8.5 g/dL   Albumin 4.6 4.0 - 5.0 g/dL    Comment:               **Please note reference interval change**   Globulin, Total 2.2 1.5 - 4.5 g/dL   Albumin/Globulin Ratio 2.1 1.2 - 2.2   Bilirubin Total 0.3 0.0 - 1.2 mg/dL   Alkaline Phosphatase 59 39 - 117 IU/L   AST 21 0 - 40 IU/L   ALT 16 0 - 44 IU/L  CBC with Differential     Status: None   Collection Time:  01/18/19 10:25 AM  Result Value Ref Range   WBC 6.8 3.4 - 10.8 x10E3/uL   RBC 4.76 4.14 - 5.80 x10E6/uL   Hemoglobin 14.9 13.0 - 17.7 g/dL   Hematocrit 16.1 09.6 - 51.0 %   MCV 92 79 - 97 fL   MCH 31.3 26.6 - 33.0 pg   MCHC 34.2 31.5 - 35.7 g/dL   RDW 04.5 40.9 - 81.1 %   Platelets 197 150 - 450 x10E3/uL   Neutrophils 61 Not Estab. %   Lymphs 31 Not Estab. %   Monocytes 7 Not Estab. %   Eos 1 Not Estab. %   Basos 0 Not Estab. %   Neutrophils Absolute 4.1 1.4 - 7.0 x10E3/uL   Lymphocytes Absolute 2.1 0.7 - 3.1 x10E3/uL   Monocytes Absolute 0.5 0.1 - 0.9 x10E3/uL   EOS (ABSOLUTE) 0.0 0.0 - 0.4 x10E3/uL   Basophils Absolute 0.0 0.0 - 0.2 x10E3/uL   Immature Granulocytes 0 Not Estab. %   Immature Grans (Abs) 0.0 0.0 - 0.1 x10E3/uL  RPR     Status: Abnormal   Collection Time: 03/13/19 11:38 AM  Result Value Ref Range   RPR Ser Ql Reactive (A) Non Reactive    Comment: (NOTE) Performed At: [facility] [address] [place], [place]  [id number] Jolene Schimke MD OZ:[id number]   HIV antibody     Status: None   Collection Time: 03/13/19 11:38 AM  Result Value Ref Range   HIV Screen 4th Generation wRfx Non Reactive Non Reactive    Comment: (NOTE) Performed At: [facility] [address] [place], [place] [id number] Jolene Schimke MD KG:[id number]   RPR, quant & T.pallidum antibodies     Status: Abnormal   Collection Time: 03/13/19 11:38 AM  Result Value Ref Range   Rapid Plasma Reagin, Quant 1:32 (H) NonRea<1:1   T Pallidum Abs Reactive (A) Non Reactive    Comment: (NOTE) Performed At: [facility] [address] [place], [place] [id number] Jolene Schimke MD VZ:[id number]      Observations/Objective: Mental status examination done on the phone.  Patient described his mood "fine".  His speech is very slow, soft with decreased volume and tone.  His thought process is slow.  His attention and concentration is fair.  He has a poverty of thought content and his speech is nonspontaneous.  His responses very minimally with yes or no.  He is more focused on DMV forms.  He denies any auditory or visual hallucination.  He denies any active or passive suicidal thoughts or homicidal thought.  There were no delusions present at this time.  He is alert and oriented x3.  His fund of knowledge is fair.  His cognition is fair.  His insight judgment is fair.  He reported no tremors or shakes.  Assessment and Plan: Schizoaffective disorder, depressed type.  I reviewed his recent blood work results, notes from the emergency room.  His hemoglobin A1c is normal.  His RPR is reactive.  He is no longer have partner.  He takes trazodone on and off as most of the time he is sleeping okay.  I will continue Abilify 10 mg daily, Cogentin 0.5 mg at bedtime and trazodone 50 mg to take as needed for insomnia.  I recommended to have his DMV forms bring to the office and we will happy to fill psychiatry statement.  Discussed medication  side effects and benefits.  Recommended to call us back if is any question or any concern.  Follow-up  in 3 months.    Follow Up Instructions:    I discussed the assessment and treatment plan with the patient. The patient was provided an opportunity to ask questions and all were answered. The patient agreed with the plan and demonstrated an understanding of the instructions.   The patient was advised to call back or seek an in-person evaluation if the symptoms worsen or if the condition fails to improve as anticipated.  I provided 15 minutes of non-face-to-face time during this encounter.   Cleotis Nipper, MD

## 2019-05-03 ENCOUNTER — Emergency Department (HOSPITAL_COMMUNITY): Payer: Medicaid Other

## 2019-05-03 ENCOUNTER — Encounter (HOSPITAL_COMMUNITY): Payer: Self-pay | Admitting: Emergency Medicine

## 2019-05-03 ENCOUNTER — Emergency Department (HOSPITAL_COMMUNITY)
Admission: EM | Admit: 2019-05-03 | Discharge: 2019-05-03 | Disposition: A | Discharge status: Home / Self Care | Payer: Medicaid Other | Attending: Emergency Medicine | Admitting: Emergency Medicine

## 2019-05-03 ENCOUNTER — Other Ambulatory Visit: Payer: Self-pay

## 2019-05-03 DIAGNOSIS — Y939 Activity, unspecified: Secondary | ICD-10-CM | POA: Insufficient documentation

## 2019-05-03 DIAGNOSIS — R0789 Other chest pain: Secondary | ICD-10-CM | POA: Diagnosis not present

## 2019-05-03 DIAGNOSIS — S3992XA Unspecified injury of lower back, initial encounter: Secondary | ICD-10-CM | POA: Diagnosis present

## 2019-05-03 DIAGNOSIS — Y92410 Unspecified street and highway as the place of occurrence of the external cause: Secondary | ICD-10-CM | POA: Diagnosis not present

## 2019-05-03 DIAGNOSIS — Y999 Unspecified external cause status: Secondary | ICD-10-CM | POA: Diagnosis not present

## 2019-05-03 DIAGNOSIS — S32000A Wedge compression fracture of unspecified lumbar vertebra, initial encounter for closed fracture: Secondary | ICD-10-CM

## 2019-05-03 DIAGNOSIS — S0990XA Unspecified injury of head, initial encounter: Secondary | ICD-10-CM

## 2019-05-03 DIAGNOSIS — Z23 Encounter for immunization: Secondary | ICD-10-CM | POA: Insufficient documentation

## 2019-05-03 DIAGNOSIS — S32009A Unspecified fracture of unspecified lumbar vertebra, initial encounter for closed fracture: Secondary | ICD-10-CM | POA: Insufficient documentation

## 2019-05-03 HISTORY — DX: Schizophrenia, unspecified: F20.9

## 2019-05-03 LAB — TYPE AND SCREEN
ABO/RH(D): O POS
Antibody Screen: NEGATIVE

## 2019-05-03 LAB — COMPREHENSIVE METABOLIC PANEL
ALT: 21 U/L (ref 0–44)
AST: 45 U/L — ABNORMAL HIGH (ref 15–41)
Albumin: 4.4 g/dL (ref 3.5–5.0)
Alkaline Phosphatase: 47 U/L (ref 38–126)
Anion gap: 11 (ref 5–15)
BUN: 17 mg/dL (ref 6–20)
CO2: 24 mmol/L (ref 22–32)
Calcium: 9.6 mg/dL (ref 8.9–10.3)
Chloride: 103 mmol/L (ref 98–111)
Creatinine, Ser: 1.46 mg/dL — ABNORMAL HIGH (ref 0.61–1.24)
GFR calc Af Amer: >60 mL/min (ref >60)
GFR calc non Af Amer: >60 mL/min (ref >60)
Glucose, Bld: 112 mg/dL — ABNORMAL HIGH (ref 70–99)
Potassium: 4.8 mmol/L (ref 3.5–5.1)
Sodium: 138 mmol/L (ref 135–145)
Total Bilirubin: 1.3 mg/dL — ABNORMAL HIGH (ref 0.3–1.2)
Total Protein: 7.2 g/dL (ref 6.5–8.1)

## 2019-05-03 LAB — CBC WITH DIFFERENTIAL/PLATELET
Abs Immature Granulocytes: 0.14 10*3/uL — ABNORMAL HIGH (ref 0.00–0.07)
Basophils Absolute: 0 10*3/uL (ref 0.0–0.1)
Basophils Relative: 0 %
Eosinophils Absolute: 0.1 10*3/uL (ref 0.0–0.5)
Eosinophils Relative: 1 %
HCT: 44.4 % (ref 39.0–52.0)
Hemoglobin: 14.5 g/dL (ref 13.0–17.0)
Immature Granulocytes: 1 %
Lymphocytes Relative: 40 %
Lymphs Abs: 5.8 10*3/uL — ABNORMAL HIGH (ref 0.7–4.0)
MCH: 31 pg (ref 26.0–34.0)
MCHC: 32.7 g/dL (ref 30.0–36.0)
MCV: 94.9 fL (ref 80.0–100.0)
Monocytes Absolute: 0.7 10*3/uL (ref 0.1–1.0)
Monocytes Relative: 5 %
Neutro Abs: 7.7 10*3/uL (ref 1.7–7.7)
Neutrophils Relative %: 53 %
Platelets: 293 10*3/uL (ref 150–400)
RBC: 4.68 MIL/uL (ref 4.22–5.81)
RDW: 14 % (ref 11.5–15.5)
WBC: 14.5 10*3/uL — ABNORMAL HIGH (ref 4.0–10.5)
nRBC: 0 % (ref 0.0–0.2)

## 2019-05-03 LAB — ABO/RH: ABO/RH(D): O POS

## 2019-05-03 MED ORDER — METHOCARBAMOL 750 MG PO TABS: 750.0000 mg | ORAL_TABLET | Freq: Three times a day (TID) | ORAL | 0 refills | Status: DC | PRN

## 2019-05-03 MED ORDER — ONDANSETRON 4 MG PO TBDP
ORAL_TABLET | ORAL | Status: AC
  Filled 2019-05-03: qty 1

## 2019-05-03 MED ORDER — IOHEXOL 300 MG/ML  SOLN
100.0000 mL | Freq: Once | INTRAMUSCULAR | Status: AC | PRN
  Administered 2019-05-03: 100 mL via INTRAVENOUS

## 2019-05-03 MED ORDER — TETANUS-DIPHTH-ACELL PERTUSSIS 5-2.5-18.5 LF-MCG/0.5 IM SUSP
0.5000 mL | Freq: Once | INTRAMUSCULAR | Status: AC
  Administered 2019-05-03: 0.5 mL via INTRAMUSCULAR
  Filled 2019-05-03: qty 0.5

## 2019-05-03 MED ORDER — ONDANSETRON 4 MG PO TBDP
4.0000 mg | ORAL_TABLET | Freq: Once | ORAL | Status: AC
  Administered 2019-05-03: 4 mg via ORAL
  Filled 2019-05-03: qty 1

## 2019-05-03 MED ORDER — HYDROMORPHONE HCL 1 MG/ML IJ SOLN
1.0000 mg | Freq: Once | INTRAMUSCULAR | Status: AC
  Administered 2019-05-03: 1 mg via INTRAVENOUS
  Filled 2019-05-03: qty 1

## 2019-05-03 MED ORDER — HYDROCODONE-ACETAMINOPHEN 5-325 MG PO TABS: 1.0000 | ORAL_TABLET | Freq: Four times a day (QID) | ORAL | 0 refills | Status: DC | PRN
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
## 2019-05-03 NOTE — Progress Notes (Signed)
Orthopedic Tech Progress Note Patient Details:  Kenneth Welch October 20, 1986 [id number]  Patient ID: Kenneth Welch., male   DOB: 1986/02/05, 33 y.o.   MRN: [id number]   Saul Fordyce 05/03/2019, 6:25 PMCalled [employer] for TLSO brace.

## 2019-05-03 NOTE — ED Notes (Signed)
Ortho tech notified of need for TLSO brace  

## 2019-05-03 NOTE — ED Notes (Signed)
Pt given ginger ale, waiting for TLSO brace

## 2019-05-03 NOTE — ED Notes (Signed)
Ortho called r/e TLSO - [employer] coming from [facility] to apply TLSO brace. MD and patient aware.
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
## 2019-05-03 NOTE — ED Notes (Addendum)
Patient transported to x-ray. ?

## 2019-05-06 ENCOUNTER — Encounter (HOSPITAL_COMMUNITY): Payer: Self-pay | Admitting: Psychiatry

## 2019-06-03 ENCOUNTER — Telehealth (HOSPITAL_COMMUNITY): Payer: Self-pay

## 2019-06-03 NOTE — Telephone Encounter (Signed)
Patient called about a letter for the [facility], it needs to have diagnosis, treatment, compliance and a clearance for him to drive with regards to his diagnosis of Schizophrenia. Is it okay to write the letter? Please review and advise, thank you

## 2019-06-03 NOTE — Telephone Encounter (Signed)
Yes

## 2019-06-04 ENCOUNTER — Encounter (HOSPITAL_COMMUNITY): Payer: Self-pay

## 2019-06-04 NOTE — Telephone Encounter (Signed)
Letter is written and in the doctors folder for a signature

## 2019-06-18 ENCOUNTER — Other Ambulatory Visit: Payer: Self-pay

## 2019-06-18 ENCOUNTER — Ambulatory Visit (HOSPITAL_COMMUNITY)
Admission: EM | Admit: 2019-06-18 | Discharge: 2019-06-18 | Disposition: A | Discharge status: Home / Self Care | Payer: Medicaid Other | Attending: Emergency Medicine | Admitting: Emergency Medicine

## 2019-06-18 ENCOUNTER — Encounter (HOSPITAL_COMMUNITY): Payer: Self-pay

## 2019-06-18 DIAGNOSIS — A64 Unspecified sexually transmitted disease: Secondary | ICD-10-CM | POA: Insufficient documentation

## 2019-06-18 MED ORDER — AZITHROMYCIN 250 MG PO TABS
ORAL_TABLET | ORAL | Status: AC
  Filled 2019-06-18: qty 4

## 2019-06-18 MED ORDER — CEFTRIAXONE SODIUM 250 MG IJ SOLR
INTRAMUSCULAR | Status: AC
  Filled 2019-06-18: qty 250

## 2019-06-18 MED ORDER — LIDOCAINE HCL (PF) 1 % IJ SOLN
INTRAMUSCULAR | Status: AC
  Filled 2019-06-18: qty 2

## 2019-06-18 MED ORDER — AZITHROMYCIN 250 MG PO TABS
1000.0000 mg | ORAL_TABLET | Freq: Once | ORAL | Status: AC
  Administered 2019-06-18: 1000 mg via ORAL

## 2019-06-18 MED ORDER — CEFTRIAXONE SODIUM 250 MG IJ SOLR
250.0000 mg | Freq: Once | INTRAMUSCULAR | Status: AC
  Administered 2019-06-18: 250 mg via INTRAMUSCULAR

## 2019-06-18 NOTE — Discharge Instructions (Addendum)
You were treated today with 2 antibiotics, Rocephin and Zithromax.  Do not have sex for 7 days.  Your sexual partners will also need to be treated if you are tests come back positive.  Please see the attached information on safe sex.  Return here if you develop fever, chills, stomach pain, difficulty with urination, back pain, or other concerning symptoms.

## 2019-06-18 NOTE — ED Triage Notes (Signed)
Pt states he has penile discharge it's tan in color and  for a month this has been going on.

## 2019-06-18 NOTE — ED Provider Notes (Signed)
[place]    CSN: [id number] Arrival date & time: 06/18/19  7517     History   Chief Complaint Chief Complaint  Patient presents with  . SEXUALLY TRANSMITTED DISEASE    HPI Kenneth Welch. is a 33 y.o. male.   Patient presents with 1 month history of "gray" penile discharge.  He states he has a history of STD.  He is sexually active with multiple partners and does not use condoms.  He denies fever, chills, vomiting, abdominal pain, dysuria, back pain.   The history is provided by the patient.    Past Medical History:  Diagnosis Date  . Asthma   . Schizophrenia [facility])     Patient Active Problem List   Diagnosis Date Noted  . Schizoaffective disorder, unspecified condition 04/10/2012    History reviewed. No pertinent surgical history.     Home Medications    Prior to Admission medications   Medication Sig Start Date End Date Taking? Authorizing Provider  albuterol (PROVENTIL HFA;VENTOLIN HFA) 108 (90 Base) MCG/ACT inhaler Inhale 1-2 puffs into the lungs every 6 (six) hours as needed for wheezing or shortness of breath. 03/07/19   Henderly, Britni A, PA-C  ARIPiprazole (ABILIFY) 10 MG tablet Take 1 tablet (10 mg total) by mouth daily. 04/18/19   Arfeen, Arlyce Harman, MD  ARIPiprazole (ABILIFY) 10 MG tablet Take 10 mg by mouth daily. 04/18/19   [provider]  benztropine (COGENTIN) 0.5 MG tablet Take 1 tablet (0.5 mg total) by mouth at bedtime. 04/18/19   Arfeen, Arlyce Harman, MD  benztropine (COGENTIN) 0.5 MG tablet Take 0.5 mg by mouth at bedtime. 01/28/19   [provider]  cetirizine (ZYRTEC) 10 MG tablet TAKE 1 TABLET BY MOUTH ONCE EVERY EVENING 08/26/17   [provider]  cetirizine (ZYRTEC) 10 MG tablet Take 10 mg by mouth every evening. 04/22/19   [provider]  FLOVENT HFA 220 MCG/ACT inhaler Inhale 2 puffs into the lungs 2 (two) times daily. 04/06/19   [provider]  Homeopathic Products (MENTAL CLARITY IN)  Inhale into the lungs.    [provider]  HYDROcodone-acetaminophen (NORCO/VICODIN) 5-325 MG tablet Take 1-2 tablets by mouth every 6 (six) hours as needed for moderate pain. 05/03/19   Lajean Saver, MD  methocarbamol (ROBAXIN) 750 MG tablet Take 1 tablet (750 mg total) by mouth 3 (three) times daily as needed (muscle spasm/pain). 05/03/19   Lajean Saver, MD  Multiple Vitamin (MULTIVITAMIN WITH MINERALS) TABS tablet Take 1 tablet by mouth daily.    [provider]  PROAIR HFA 108 [phone] Base) MCG/ACT inhaler Take 1-2 puffs by mouth every 6 (six) hours as needed for shortness of breath or wheezing. 03/07/19   [provider]  traZODone (DESYREL) 50 MG tablet TAKE 1 TABLET (50 MG TOTAL) BY MOUTH AS NEEDED AT BEDTIME. 04/17/18   Arfeen, Arlyce Harman, MD    Family History History reviewed. No pertinent family history.  Social History Social History   Tobacco Use  . Smoking status: Never Smoker  . Smokeless tobacco: Never Used  Substance Use Topics  . Alcohol use: Never    Frequency: Never  . Drug use: Never     Allergies   Patient has no known allergies.   Review of Systems Review of Systems   Physical Exam Triage Vital Signs ED Triage Vitals  Enc Vitals Group     BP 06/18/19 0949 123/66     Pulse Rate 06/18/19 0949 78  Resp 06/18/19 0949 18     Temp 06/18/19 0949 98.3 F (36.8 C)     Temp Source 06/18/19 0949 Oral     SpO2 06/18/19 0949 100 %     Weight 06/18/19 0950 175 lb (79.4 kg)     Height --      Head Circumference --      Peak Flow --      Pain Score 06/18/19 0950 6     Pain Loc --      Pain Edu? --      Excl. in GC? --    No data found.  Updated Vital Signs BP 123/66 (BP Location: Right Arm)   Pulse 78   Temp 98.3 F (36.8 C) (Oral)   Resp 18   Wt 175 lb (79.4 kg)   SpO2 100%   BMI 22.47 kg/m   Visual Acuity Right Eye Distance:   Left Eye Distance:   Bilateral Distance:    Right Eye Near:   Left Eye Near:    Bilateral  Near:     Physical Exam Vitals signs and nursing note reviewed.  Constitutional:      Appearance: He is well-developed.  HENT:     Head: Normocephalic and atraumatic.  Eyes:     Conjunctiva/sclera: Conjunctivae normal.  Neck:     Musculoskeletal: Neck supple.  Cardiovascular:     Rate and Rhythm: Normal rate and regular rhythm.  Pulmonary:     Effort: Pulmonary effort is normal. No respiratory distress.     Breath sounds: Normal breath sounds.  Abdominal:     Palpations: Abdomen is soft.     Tenderness: There is no abdominal tenderness. There is no right CVA tenderness, left CVA tenderness, guarding or rebound.  Genitourinary:    Scrotum/Testes: Normal.     Comments: Small amount clear/gray penile discharge.  Skin:    General: Skin is warm and dry.  Neurological:     Mental Status: He is alert.      UC Treatments / Results  Labs (all labs ordered are listed, but only abnormal results are displayed) Labs Reviewed  URINE CYTOLOGY ANCILLARY ONLY    EKG   Radiology No results found.  Procedures Procedures (including critical care time)  Medications Ordered in UC Medications  cefTRIAXone (ROCEPHIN) injection 250 mg (has no administration in time range)  azithromycin (ZITHROMAX) tablet 1,000 mg (has no administration in time range)    Initial Impression / Assessment and Plan / UC Course  I have reviewed the triage vital signs and the nursing notes.  Pertinent labs & imaging results that were available during my care of the patient were reviewed by me and considered in my medical decision making (see chart for details).   Sexually transmitted disease.  Urine sent for cytology for gonorrhea, chlamydia, trichomonas.  Treated today with Rocephin and Zithromax.  Discussed with patient that he needs to practice safe sex.  Discussed that all sexual partners will need to be treated if his tests come back positive and that he should abstain from sex for 7 days.    Final  Clinical Impressions(s) / UC Diagnoses   Final diagnoses:  Sexually transmitted disease (STD)     Discharge Instructions     You were treated today with 2 antibiotics, Rocephin and Zithromax.  Do not have sex for 7 days.  Your sexual partners will also need to be treated if you are tests come back positive.  Please see the attached information on  safe sex.  Return here if you develop fever, chills, stomach pain, difficulty with urination, back pain, or other concerning symptoms.      ED Prescriptions    None     Controlled Substance Prescriptions [place] Controlled Substance Registry consulted? Not Applicable   Mickie Bailate, Ayrton Mcvay H, NP 06/18/19 1009

## 2019-06-19 LAB — URINE CYTOLOGY ANCILLARY ONLY
Chlamydia: NEGATIVE
Neisseria Gonorrhea: POSITIVE — AB
Trichomonas: NEGATIVE

## 2019-06-21 ENCOUNTER — Telehealth (HOSPITAL_COMMUNITY): Payer: Self-pay | Admitting: Emergency Medicine

## 2019-06-21 NOTE — Telephone Encounter (Signed)
Test for gonorrhea was positive. This was treated at the urgent care visit with IM rocephin 250mg and po zithromax 1g. Pt needs education to refrain from sexual intercourse for 7 days after treatment to give the medicine time to work. Sexual partners need to be notified and tested/treated. Condoms may reduce risk of reinfection. Recheck or followup with PCP for further evaluation if symptoms are not improving. [facility] notified.   Patient contacted and made aware of all results, all questions answered.   

## 2019-07-02 ENCOUNTER — Other Ambulatory Visit (HOSPITAL_COMMUNITY): Payer: Self-pay

## 2019-07-02 DIAGNOSIS — F259 Schizoaffective disorder, unspecified: Secondary | ICD-10-CM

## 2019-07-02 MED ORDER — ARIPIPRAZOLE 10 MG PO TABS: 10.0000 mg | ORAL_TABLET | Freq: Every day | ORAL | 0 refills | Status: DC

## 2019-07-02 NOTE — Progress Notes (Unsigned)
Patient called, he was going to run out of Abilify a couple of days before his appointment, I sent in a 30 day order

## 2019-07-17 ENCOUNTER — Other Ambulatory Visit (HOSPITAL_COMMUNITY): Payer: Self-pay | Admitting: Psychiatry

## 2019-07-17 DIAGNOSIS — F259 Schizoaffective disorder, unspecified: Secondary | ICD-10-CM

## 2019-07-19 ENCOUNTER — Other Ambulatory Visit: Payer: Self-pay

## 2019-07-19 ENCOUNTER — Ambulatory Visit (INDEPENDENT_AMBULATORY_CARE_PROVIDER_SITE_OTHER): Payer: Medicaid Other | Admitting: Psychiatry

## 2019-07-19 ENCOUNTER — Encounter (HOSPITAL_COMMUNITY): Payer: Self-pay | Admitting: Psychiatry

## 2019-07-19 DIAGNOSIS — F259 Schizoaffective disorder, unspecified: Secondary | ICD-10-CM

## 2019-07-19 DIAGNOSIS — F251 Schizoaffective disorder, depressive type: Secondary | ICD-10-CM

## 2019-07-19 MED ORDER — BENZTROPINE MESYLATE 0.5 MG PO TABS: 0.5000 mg | ORAL_TABLET | Freq: Every day | ORAL | 0 refills | Status: DC

## 2019-07-19 MED ORDER — ARIPIPRAZOLE 10 MG PO TABS: 10.0000 mg | ORAL_TABLET | Freq: Every day | ORAL | 0 refills | Status: DC
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
## 2019-09-25 ENCOUNTER — Other Ambulatory Visit: Payer: Self-pay

## 2019-09-25 ENCOUNTER — Encounter (HOSPITAL_COMMUNITY): Payer: Self-pay | Admitting: Emergency Medicine

## 2019-09-25 ENCOUNTER — Ambulatory Visit (HOSPITAL_COMMUNITY)
Admission: EM | Admit: 2019-09-25 | Discharge: 2019-09-25 | Disposition: A | Discharge status: Home / Self Care | Payer: Medicaid Other | Attending: Urgent Care | Admitting: Urgent Care

## 2019-09-25 DIAGNOSIS — Z7251 High risk heterosexual behavior: Secondary | ICD-10-CM | POA: Diagnosis present

## 2019-09-25 DIAGNOSIS — Z8619 Personal history of other infectious and parasitic diseases: Secondary | ICD-10-CM | POA: Diagnosis not present

## 2019-09-25 DIAGNOSIS — Z7253 High risk bisexual behavior: Secondary | ICD-10-CM | POA: Diagnosis present

## 2019-09-25 DIAGNOSIS — R300 Dysuria: Secondary | ICD-10-CM

## 2019-09-25 DIAGNOSIS — R369 Urethral discharge, unspecified: Secondary | ICD-10-CM

## 2019-09-25 LAB — HIV ANTIBODY (ROUTINE TESTING W REFLEX): HIV Screen 4th Generation wRfx: NONREACTIVE

## 2019-09-25 MED ORDER — CEFTRIAXONE SODIUM 250 MG IJ SOLR
INTRAMUSCULAR | Status: AC
  Filled 2019-09-25: qty 250

## 2019-09-25 MED ORDER — AZITHROMYCIN 250 MG PO TABS
ORAL_TABLET | ORAL | Status: AC
  Filled 2019-09-25: qty 4

## 2019-09-25 MED ORDER — AZITHROMYCIN 250 MG PO TABS
1000.0000 mg | ORAL_TABLET | Freq: Once | ORAL | Status: AC
  Administered 2019-09-25: 1000 mg via ORAL

## 2019-09-25 MED ORDER — CEFTRIAXONE SODIUM 250 MG IJ SOLR
250.0000 mg | Freq: Once | INTRAMUSCULAR | Status: AC
  Administered 2019-09-25: 14:00:00 250 mg via INTRAMUSCULAR

## 2019-09-25 NOTE — Discharge Instructions (Signed)
Avoid all forms of sexual intercourse (oral, vaginal, anal) for the next 7 days to avoid spreading/reinfecting. Return if symptoms worsen/do not resolve, you develop fever, abdominal pain, blood in your urine, or are re-exposed to an STI.  

## 2019-09-25 NOTE — ED Triage Notes (Signed)
Penile discharge and dysuria since Saturday. Sore throat since Sunday.

## 2019-09-25 NOTE — ED Provider Notes (Signed)
MRN: [id number] DOB: 1986-01-24  Subjective:   Kenneth Welch. is a 33 y.o. male presenting for 4 day history of mild to moderate persistent dysuria, penile discharge.  Patient is sexually active with multiple male partners and does not always use condoms for protection.  He did test positive for gonorrhea July 2020 and was treated for this then.  He has not been tested for STIs since. Denies hematuria, urinary frequency, penile swelling, testicular pain, testicular swelling, anal pain, groin pain.   No current facility-administered medications for this encounter.   Current Outpatient Medications:  .  albuterol (PROVENTIL HFA;VENTOLIN HFA) 108 (90 Base) MCG/ACT inhaler, Inhale 1-2 puffs into the lungs every 6 (six) hours as needed for wheezing or shortness of breath., Disp: 1 Inhaler, Rfl: 0 .  ARIPiprazole (ABILIFY) 10 MG tablet, Take 1 tablet (10 mg total) by mouth daily., Disp: 90 tablet, Rfl: 0 .  benztropine (COGENTIN) 0.5 MG tablet, Take 1 tablet (0.5 mg total) by mouth at bedtime., Disp: 90 tablet, Rfl: 0 .  cetirizine (ZYRTEC) 10 MG tablet, Take 10 mg by mouth every evening., Disp: , Rfl:  .  FLOVENT HFA 220 MCG/ACT inhaler, Inhale 2 puffs into the lungs 2 (two) times daily., Disp: , Rfl:  .  methocarbamol (ROBAXIN) 750 MG tablet, Take 1 tablet (750 mg total) by mouth 3 (three) times daily as needed (muscle spasm/pain)., Disp: 15 tablet, Rfl: 0 .  Multiple Vitamin (MULTIVITAMIN WITH MINERALS) TABS tablet, Take 1 tablet by mouth daily., Disp: , Rfl:  .  Homeopathic Products (MENTAL CLARITY IN), Inhale into the lungs., Disp: , Rfl:  .  PROAIR HFA 108 (90 Base) MCG/ACT inhaler, Take 1-2 puffs by mouth every 6 (six) hours as needed for shortness of breath or wheezing., Disp: , Rfl:  .  traZODone (DESYREL) 50 MG tablet, TAKE 1 TABLET (50 MG TOTAL) BY MOUTH AS NEEDED AT BEDTIME. (Patient not taking: Reported on 07/19/2019), Disp: 90 tablet, Rfl: 0   No Known Allergies  Past Medical  History:  Diagnosis Date  . Asthma   . Schizophrenia (HCC)      History reviewed. No pertinent surgical history.  ROS  Objective:   Vitals: BP 111/71   Pulse 84   Temp 98.4 F (36.9 C) (Oral)   Resp 16   SpO2 98%   Physical Exam Constitutional:      Appearance: Normal appearance. He is well-developed and normal weight.  HENT:     Head: Normocephalic and atraumatic.     Right Ear: External ear normal.     Left Ear: External ear normal.     Nose: Nose normal.     Mouth/Throat:     Pharynx: Oropharynx is clear.  Eyes:     Extraocular Movements: Extraocular movements intact.     Pupils: Pupils are equal, round, and reactive to light.  Cardiovascular:     Rate and Rhythm: Normal rate.  Pulmonary:     Effort: Pulmonary effort is normal.  Genitourinary:    Penis: No phimosis, paraphimosis, hypospadias, erythema, tenderness, discharge, swelling or lesions.   Neurological:     Mental Status: He is alert and oriented to person, place, and time.  Psychiatric:        Mood and Affect: Mood normal.        Behavior: Behavior normal.      Assessment and Plan :   1. Penile discharge   2. Dysuria   3. Unprotected sex   4. High risk bisexual  behavior     Will treat empirically per CDC guidelines for gonorrhea and chlamydia with IM ceftriaxone and azithromycin in clinic.  Counseled on safe sex practices including abstaining for 1 week following treatment.  Counseled patient on potential for adverse effects with medications prescribed/recommended today, ER and return-to-clinic precautions discussed, patient verbalized understanding.   Jaynee Eagles, PA-C 09/25/19 1357

## 2019-09-26 LAB — RPR
RPR Ser Ql: REACTIVE — AB
RPR Titer: 1:1 {titer}

## 2019-09-27 ENCOUNTER — Telehealth (HOSPITAL_COMMUNITY): Payer: Self-pay | Admitting: Emergency Medicine

## 2019-09-27 LAB — CYTOLOGY, (ORAL, ANAL, URETHRAL) ANCILLARY ONLY
Chlamydia: NEGATIVE
Neisseria Gonorrhea: POSITIVE — AB
Trichomonas: NEGATIVE

## 2019-09-27 LAB — T.PALLIDUM AB, TOTAL: T Pallidum Abs: REACTIVE — AB

## 2019-09-27 NOTE — Telephone Encounter (Signed)
Test for gonorrhea was positive. This was treated at the urgent care visit with IM rocephin 250mg  and po zithromax 1g. Pt needs education to refrain from sexual intercourse for 7 days after treatment to give the medicine time to work. Sexual partners need to be notified and tested/treated. Condoms may reduce risk of reinfection. Recheck or followup with PCP for further evaluation if symptoms are not improving. [facility] notified.   RPR titer is 1:1, pt has previously been treated for syphilis. This is not considered an active infection, pt states no known exposure and no symptoms for syphilis.   Patient contacted and made aware of    results, all questions answered

## 2019-10-14 ENCOUNTER — Ambulatory Visit (INDEPENDENT_AMBULATORY_CARE_PROVIDER_SITE_OTHER): Payer: Medicaid Other | Admitting: Psychiatry

## 2019-10-14 ENCOUNTER — Other Ambulatory Visit: Payer: Self-pay

## 2019-10-14 ENCOUNTER — Encounter (HOSPITAL_COMMUNITY): Payer: Self-pay | Admitting: Psychiatry

## 2019-10-14 DIAGNOSIS — F259 Schizoaffective disorder, unspecified: Secondary | ICD-10-CM

## 2019-10-14 MED ORDER — ARIPIPRAZOLE 10 MG PO TABS: 10.0000 mg | ORAL_TABLET | Freq: Every day | ORAL | 0 refills | Status: DC

## 2019-10-14 MED ORDER — BENZTROPINE MESYLATE 0.5 MG PO TABS: 0.5000 mg | ORAL_TABLET | Freq: Every day | ORAL | 0 refills | Status: DC
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
## 2019-10-30 ENCOUNTER — Telehealth (HOSPITAL_COMMUNITY): Payer: Self-pay | Admitting: *Deleted

## 2019-10-30 NOTE — Telephone Encounter (Signed)
Received call from Dr. Sheppard Coil from [facility] in [place] stating pt presented to their ED. Writer confirmed medications for provider.

## 2020-01-08 ENCOUNTER — Ambulatory Visit (INDEPENDENT_AMBULATORY_CARE_PROVIDER_SITE_OTHER): Payer: Medicaid Other | Admitting: Psychiatry

## 2020-01-08 ENCOUNTER — Other Ambulatory Visit: Payer: Self-pay

## 2020-01-08 ENCOUNTER — Encounter (HOSPITAL_COMMUNITY): Payer: Self-pay | Admitting: Psychiatry

## 2020-01-08 DIAGNOSIS — F259 Schizoaffective disorder, unspecified: Secondary | ICD-10-CM | POA: Diagnosis not present

## 2020-01-08 DIAGNOSIS — F419 Anxiety disorder, unspecified: Secondary | ICD-10-CM

## 2020-01-08 MED ORDER — BENZTROPINE MESYLATE 0.5 MG PO TABS: 0.5000 mg | ORAL_TABLET | Freq: Every day | ORAL | 0 refills | Status: DC

## 2020-01-08 MED ORDER — ARIPIPRAZOLE 10 MG PO TABS: 10.0000 mg | ORAL_TABLET | Freq: Every day | ORAL | 0 refills | Status: DC
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
## 2020-04-07 ENCOUNTER — Encounter (HOSPITAL_COMMUNITY): Payer: Self-pay | Admitting: Psychiatry

## 2020-04-07 ENCOUNTER — Other Ambulatory Visit: Payer: Self-pay

## 2020-04-07 ENCOUNTER — Telehealth (HOSPITAL_COMMUNITY): Payer: Medicaid Other | Admitting: Psychiatry

## 2020-04-09 ENCOUNTER — Telehealth (INDEPENDENT_AMBULATORY_CARE_PROVIDER_SITE_OTHER): Payer: Medicaid Other | Admitting: Psychiatry

## 2020-04-09 ENCOUNTER — Encounter (HOSPITAL_COMMUNITY): Payer: Self-pay | Admitting: Psychiatry

## 2020-04-09 ENCOUNTER — Other Ambulatory Visit: Payer: Self-pay

## 2020-04-09 DIAGNOSIS — F259 Schizoaffective disorder, unspecified: Secondary | ICD-10-CM | POA: Diagnosis not present

## 2020-04-09 DIAGNOSIS — F419 Anxiety disorder, unspecified: Secondary | ICD-10-CM | POA: Diagnosis not present

## 2020-04-09 MED ORDER — ARIPIPRAZOLE 10 MG PO TABS: 10.0000 mg | ORAL_TABLET | Freq: Every day | ORAL | 0 refills | Status: DC

## 2020-04-09 MED ORDER — BENZTROPINE MESYLATE 0.5 MG PO TABS: 0.5000 mg | ORAL_TABLET | Freq: Every day | ORAL | 0 refills | Status: DC

## 2020-04-09 NOTE — Progress Notes (Signed)
Virtual Visit via Telephone Note  I connected with Kenneth Welch. on 04/09/20 at 11:40 AM EDT by telephone and verified that I am speaking with the correct person using two identifiers.   I discussed the limitations, risks, security and privacy concerns of performing an evaluation and management service by telephone and the availability of in person appointments. I also discussed with the patient that there may be a patient responsible charge related to this service. The patient expressed understanding and agreed to proceed.   History of Present Illness: Patient is evaluated by phone session.  He is taking Abilify and Cogentin.  He was told by an orthopedist that his car was sold in [place] and they could not find the details.  He is disappointed because he does not have a car and he cannot work.  He lives by himself.  He is getting support financially from SSI.  Sometimes his uncle and sister does help him.  He feels the Abilify and Cogentin helping his paranoia, hallucination and anxiety.  He has not taken trazodone in a while since sleep is better.  He admitted weight gain since not doing walking, working for exercise.  He has not taken his inhaler because not able to keep appointment with PCP because of transportation.  He is not drinking or using any illegal substances.  Since his father deceased last 16-Aug-2023 he is living by himself.  He has no tremors or shakes or any EPS.   Past Psychiatric History:Reviewed. H/O multiple inpatient for paranoia, hallucination, psychosis and depression. Tried zyprexa which did not help.   Psychiatric Specialty Exam: Physical Exam  Review of Systems  There were no vitals taken for this visit.There is no height or weight on file to calculate BMI.  General Appearance: NA  Eye Contact:  NA  Speech:  Slow  Volume:  Decreased  Mood:  Euthymic  Affect:  NA  Thought Process:  Descriptions of Associations: Intact  Orientation:  Full (Time, Place, and  Person)  Thought Content:  Rumination  Suicidal Thoughts:  No  Homicidal Thoughts:  No  Memory:  Immediate;   Good Recent;   Fair Remote;   Fair  Judgement:  Intact  Insight:  Present  Psychomotor Activity:  NA  Concentration:  Concentration: Fair and Attention Span: Fair  Recall:  [employer] of Knowledge:  Fair  Language:  Good  Akathisia:  No  Handed:  Right  AIMS (if indicated):     Assets:  Communication Skills Desire for Improvement Housing Social Support  ADL's:  Intact  Cognition:  WNL  Sleep:   ok      Assessment and Plan: Schizoaffective disorder, depressed type.  Anxiety.  Patient taking Abilify and Cogentin.  Discussed sweets and weight gain and not able to exercise and walking.  He is without transportation.  I encourage to get help from his sister or uncle to do help him to keep appointment with his PCP at [facility] so he can continue his inhaler and get blood work.  We discussed Abilify can cause sometimes weight gain and blood sugar to go up and he need a blood work.  He agreed that he will try to get appointment with PCP and asked sister or uncle to take him there for refill and blood work.  He has no major issues with the medication.  He has not taken trazodone and does not need a new prescription.  Continue Abilify 10 mg daily and Cogentin 0.5  mg at bedtime.  Recommended to call us back if is any questions or any concerns.  Follow-up in 3 months.    Follow Up Instructions:    I discussed the assessment and treatment plan with the patient. The patient was provided an opportunity to ask questions and all were answered. The patient agreed with the plan and demonstrated an understanding of the instructions.   The patient was advised to call back or seek an in-person evaluation if the symptoms worsen or if the condition fails to improve as anticipated.  I provided 20 minutes of non-face-to-face time during this encounter.   Kathlee Nations, MD

## 2020-04-24 ENCOUNTER — Other Ambulatory Visit: Payer: Self-pay

## 2020-04-24 ENCOUNTER — Encounter (HOSPITAL_COMMUNITY): Payer: Self-pay | Admitting: Emergency Medicine

## 2020-04-24 ENCOUNTER — Emergency Department (HOSPITAL_COMMUNITY)
Admission: EM | Admit: 2020-04-24 | Discharge: 2020-04-25 | Disposition: A | Discharge status: Home / Self Care | Payer: Medicaid Other | Attending: Emergency Medicine | Admitting: Emergency Medicine

## 2020-04-24 DIAGNOSIS — Z79899 Other long term (current) drug therapy: Secondary | ICD-10-CM | POA: Insufficient documentation

## 2020-04-24 DIAGNOSIS — K5900 Constipation, unspecified: Secondary | ICD-10-CM | POA: Diagnosis not present

## 2020-04-24 DIAGNOSIS — K625 Hemorrhage of anus and rectum: Secondary | ICD-10-CM | POA: Insufficient documentation

## 2020-04-24 DIAGNOSIS — K6289 Other specified diseases of anus and rectum: Secondary | ICD-10-CM | POA: Diagnosis present

## 2020-04-24 DIAGNOSIS — J45909 Unspecified asthma, uncomplicated: Secondary | ICD-10-CM | POA: Diagnosis not present

## 2020-04-24 DIAGNOSIS — A601 Herpesviral infection of perianal skin and rectum: Secondary | ICD-10-CM | POA: Diagnosis not present

## 2020-04-24 NOTE — ED Triage Notes (Signed)
Patient presents with a possible anal fissure that he believes he got on Sunday. Patient states today it began to bleed and has been continuous. Patient noted white streaks in stool, but unsure about infection.

## 2020-04-25 MED ORDER — IBUPROFEN 200 MG PO TABS
600.0000 mg | ORAL_TABLET | Freq: Once | ORAL | Status: AC
  Administered 2020-04-25: 600 mg via ORAL
  Filled 2020-04-25: qty 3

## 2020-04-25 MED ORDER — ACYCLOVIR 400 MG PO TABS: 400.0000 mg | ORAL_TABLET | Freq: Three times a day (TID) | ORAL | 0 refills | Status: AC

## 2020-04-25 MED ORDER — CEFTRIAXONE SODIUM 1 G IJ SOLR
500.0000 mg | Freq: Once | INTRAMUSCULAR | Status: AC
  Administered 2020-04-25: 500 mg via INTRAMUSCULAR
  Filled 2020-04-25: qty 10

## 2020-04-25 MED ORDER — IBUPROFEN 600 MG PO TABS: 600.0000 mg | ORAL_TABLET | Freq: Four times a day (QID) | ORAL | 0 refills | Status: DC | PRN

## 2020-04-25 MED ORDER — STERILE WATER FOR INJECTION IJ SOLN
INTRAMUSCULAR | Status: AC
  Filled 2020-04-25: qty 10

## 2020-04-25 MED ORDER — AZITHROMYCIN 250 MG PO TABS
1000.0000 mg | ORAL_TABLET | Freq: Once | ORAL | Status: AC
  Administered 2020-04-25: 1000 mg via ORAL
  Filled 2020-04-25: qty 4

## 2020-04-25 MED ORDER — ACYCLOVIR 200 MG PO CAPS
400.0000 mg | ORAL_CAPSULE | Freq: Once | ORAL | Status: AC
  Administered 2020-04-25: 400 mg via ORAL
  Filled 2020-04-25: qty 2

## 2020-04-25 NOTE — Discharge Instructions (Signed)
Recommend a stool softener found over-the-counter in any drug store to relieve constipation.   Take Acyclovir for suspected herpetic blisters in the perianal area. This should be rechecked with your doctor next week.

## 2020-04-25 NOTE — ED Provider Notes (Signed)
[place] DEPT Provider Note   CSN: [id number] Arrival date & time: 04/24/20  2240     History Chief Complaint  Patient presents with  . Rectal Pain  . Rectal Bleeding    Kenneth Welch. is a 34 y.o. male.  Patient to ED with complaint of rectal pain and BRB with bowel movements. Symptoms started several days ago. He reports having to strain to pass stools are hard, and appear to have a white substance in them. He states he knows what a fissure looks like and he hasn't seen any on self exam. No abdominal pain, vomiting. He reports anal penetration intercourse. He denies penile discharge or dysuria.   The history is provided by the patient. No language interpreter was used.  Rectal Bleeding Associated symptoms: no abdominal pain and no fever        Past Medical History:  Diagnosis Date  . Asthma   . Schizophrenia [facility])     Patient Active Problem List   Diagnosis Date Noted  . Schizoaffective disorder, unspecified condition 04/10/2012    History reviewed. No pertinent surgical history.     No family history on file.  Social History   Tobacco Use  . Smoking status: Never Smoker  . Smokeless tobacco: Never Used  Substance Use Topics  . Alcohol use: Never  . Drug use: Never    Home Medications Prior to Admission medications   Medication Sig Start Date End Date Taking? Authorizing Provider  ARIPiprazole (ABILIFY) 10 MG tablet Take 1 tablet (10 mg total) by mouth daily. 04/09/20   Arfeen, Arlyce Harman, MD  benztropine (COGENTIN) 0.5 MG tablet Take 1 tablet (0.5 mg total) by mouth at bedtime. 04/09/20   Arfeen, Arlyce Harman, MD  cetirizine (ZYRTEC) 10 MG tablet Take 10 mg by mouth every evening. 04/22/19   [provider]  FLOVENT HFA 220 MCG/ACT inhaler Inhale 2 puffs into the lungs 2 (two) times daily. 04/06/19   [provider]  methocarbamol (ROBAXIN) 750 MG tablet Take 1 tablet (750 mg total) by mouth 3 (three) times  daily as needed (muscle spasm/pain). 05/03/19   Lajean Saver, MD  Multiple Vitamin (MULTIVITAMIN WITH MINERALS) TABS tablet Take 1 tablet by mouth daily.    [provider]  traZODone (DESYREL) 50 MG tablet TAKE 1 TABLET (50 MG TOTAL) BY MOUTH AS NEEDED AT BEDTIME. Patient not taking: Reported on 07/19/2019 04/17/18   Kathlee Nations, MD    Allergies    Patient has no known allergies.  Review of Systems   Review of Systems  Constitutional: Negative for fever.  Gastrointestinal: Positive for anal bleeding, hematochezia and rectal pain. Negative for abdominal pain and nausea.  Genitourinary: Negative for discharge and dysuria.    Physical Exam Updated Vital Signs BP 137/80   Pulse 85   Temp 98.3 F (36.8 C) (Oral)   Resp 20   SpO2 99%   Physical Exam Vitals and nursing note reviewed.  Constitutional:      Appearance: He is well-developed.  Pulmonary:     Effort: Pulmonary effort is normal.  Genitourinary:    Comments: Two shallow ulcerations in the perianal region. No active bleeding. No hemorrhoids visualized. No fissures. No induration or fluctuance. No purulence.  Musculoskeletal:        General: Normal range of motion.     Cervical back: Normal range of motion.  Skin:    General: Skin is warm and dry.  Neurological:  Mental Status: He is alert and oriented to person, place, and time.     ED Results / Procedures / Treatments   Labs (all labs ordered are listed, but only abnormal results are displayed) Labs Reviewed - No data to display  EKG None  Radiology No results found.  Procedures Procedures (including critical care time)  Medications Ordered in ED Medications - No data to display  ED Course  I have reviewed the triage vital signs and the nursing notes.  Pertinent labs & imaging results that were available during my care of the patient were reviewed by me and considered in my medical decision making (see chart for details).    MDM  Rules/Calculators/A&P                      Patient to ED with ss/sxs as detailed in the HPI.   He has ulcerations perianally that are c/w herpetic lesions. No intact blisters. This was discussed with the patient. Will start on Acyclovir but discussed that being seen and treated at the onset of symptoms is more effective. Will cover for GC/chlamydia. Patient is in agreement with plan of care.    Final Clinical Impression(s) / ED Diagnoses Final diagnoses:  None   1. Perianal herpes 2. Constipation  Rx / DC Orders ED Discharge Orders    None       Elpidio Anis, PA-C 04/25/20 0108    Ward, Layla Maw, DO 04/25/20 0131

## 2020-07-09 ENCOUNTER — Telehealth (INDEPENDENT_AMBULATORY_CARE_PROVIDER_SITE_OTHER): Payer: Medicaid Other | Admitting: Psychiatry

## 2020-07-09 ENCOUNTER — Other Ambulatory Visit: Payer: Self-pay

## 2020-07-09 DIAGNOSIS — F419 Anxiety disorder, unspecified: Secondary | ICD-10-CM

## 2020-07-09 DIAGNOSIS — F259 Schizoaffective disorder, unspecified: Secondary | ICD-10-CM | POA: Diagnosis not present

## 2020-07-09 MED ORDER — BENZTROPINE MESYLATE 0.5 MG PO TABS: 0.5000 mg | ORAL_TABLET | Freq: Every day | ORAL | 0 refills | Status: DC

## 2020-07-09 MED ORDER — ARIPIPRAZOLE 10 MG PO TABS: 10.0000 mg | ORAL_TABLET | Freq: Every day | ORAL | 0 refills | Status: DC

## 2020-07-09 NOTE — Progress Notes (Signed)
Virtual Visit via Telephone Note  I connected with Kenneth Welch. on 07/09/20 at 11:40 AM EDT by telephone and verified that I am speaking with the correct person using two identifiers.  Location: Patient: home Provider: home office   I discussed the limitations, risks, security and privacy concerns of performing an evaluation and management service by telephone and the availability of in person appointments. I also discussed with the patient that there may be a patient responsible charge related to this service. The patient expressed understanding and agreed to proceed.   History of Present Illness: Patient is evaluated by phone session.  He is taking Abilify and Cogentin and rarely takes trazodone once in a while.  He is living by himself and getting SSI.  He is not in any relationship.  He is trying to lose weight and walks every day and so far he has lost 10 pounds since the last visit.  He feels the combination of Abilify and Cogentin helping his paranoia, hallucination, anxiety.  He has no tremors, shakes or any EPS.  Denies drinking or using any illegal substances.  Since father died last year he is by himself.  He is not interested in any new relationship.  He like to keep his current medication.   Past Psychiatric History:Reviewed. H/O multiple inpatient for paranoia, hallucination, psychosis and depression. Tried zyprexa which did not help.   Psychiatric Specialty Exam: Physical Exam  Review of Systems  Weight 182 lb (82.6 kg).There is no height or weight on file to calculate BMI.  General Appearance: NA  Eye Contact:  NA  Speech:  Slow  Volume:  Decreased  Mood:  Euthymic  Affect:  NA  Thought Process:  Descriptions of Associations: Intact  Orientation:  Full (Time, Place, and Person)  Thought Content:  Rumination  Suicidal Thoughts:  No  Homicidal Thoughts:  No  Memory:  Immediate;   Good Recent;   Fair Remote;   Fair  Judgement:  Intact  Insight:  Fair   Psychomotor Activity:  NA  Concentration:  Concentration: Fair and Attention Span: Fair  Recall:  Good  Fund of Knowledge:  Good  Language:  Fair  Akathisia:  No  Handed:  Right  AIMS (if indicated):     Assets:  Communication Skills Desire for Improvement Housing  ADL's:  Intact  Cognition:  WNL  Sleep:   ok       Assessment and Plan: Schizoaffective disorder, depressed type.  Anxiety.  Patient is a stable on the combination of Abilify and Cogentin.  He does not need any refill of trazodone since he takes very rarely.  Discussed medication side effects and benefits.  He has been trying to lose weight and walking every day.  Encouraged that.  Recommended to call us back if is any question or any concern.  Patient is not interested in therapy.  Recommended to call us back if is any question or any concern.  Follow-up in 3 months.  Follow Up Instructions:    I discussed the assessment and treatment plan with the patient. The patient was provided an opportunity to ask questions and all were answered. The patient agreed with the plan and demonstrated an understanding of the instructions.   The patient was advised to call back or seek an in-person evaluation if the symptoms worsen or if the condition fails to improve as anticipated.  I provided 12 minutes of non-face-to-face time during this encounter.   Cleotis Nipper, MD

## 2020-07-17 ENCOUNTER — Emergency Department (HOSPITAL_COMMUNITY)
Admission: EM | Admit: 2020-07-17 | Discharge: 2020-07-18 | Disposition: A | Discharge status: Home / Self Care | Payer: Medicaid Other | Attending: Emergency Medicine | Admitting: Emergency Medicine

## 2020-07-17 ENCOUNTER — Emergency Department (HOSPITAL_COMMUNITY): Payer: Medicaid Other

## 2020-07-17 ENCOUNTER — Other Ambulatory Visit: Payer: Self-pay

## 2020-07-17 ENCOUNTER — Encounter (HOSPITAL_COMMUNITY): Payer: Self-pay | Admitting: *Deleted

## 2020-07-17 DIAGNOSIS — Y939 Activity, unspecified: Secondary | ICD-10-CM | POA: Diagnosis not present

## 2020-07-17 DIAGNOSIS — Y999 Unspecified external cause status: Secondary | ICD-10-CM | POA: Diagnosis not present

## 2020-07-17 DIAGNOSIS — Z5321 Procedure and treatment not carried out due to patient leaving prior to being seen by health care provider: Secondary | ICD-10-CM | POA: Insufficient documentation

## 2020-07-17 DIAGNOSIS — Y929 Unspecified place or not applicable: Secondary | ICD-10-CM | POA: Diagnosis not present

## 2020-07-17 DIAGNOSIS — R0789 Other chest pain: Secondary | ICD-10-CM | POA: Diagnosis not present

## 2020-07-17 DIAGNOSIS — X500XXA Overexertion from strenuous movement or load, initial encounter: Secondary | ICD-10-CM | POA: Diagnosis not present

## 2020-07-17 LAB — BASIC METABOLIC PANEL
Anion gap: 12 (ref 5–15)
BUN: 13 mg/dL (ref 6–20)
CO2: 26 mmol/L (ref 22–32)
Calcium: 10 mg/dL (ref 8.9–10.3)
Chloride: 102 mmol/L (ref 98–111)
Creatinine, Ser: 1.22 mg/dL (ref 0.61–1.24)
GFR calc Af Amer: >60 mL/min (ref >60)
GFR calc non Af Amer: >60 mL/min (ref >60)
Glucose, Bld: 96 mg/dL (ref 70–99)
Potassium: 3.8 mmol/L (ref 3.5–5.1)
Sodium: 140 mmol/L (ref 135–145)

## 2020-07-17 LAB — CBC
HCT: 46.2 % (ref 39.0–52.0)
Hemoglobin: 15.4 g/dL (ref 13.0–17.0)
MCH: 31.6 pg (ref 26.0–34.0)
MCHC: 33.3 g/dL (ref 30.0–36.0)
MCV: 94.9 fL (ref 80.0–100.0)
Platelets: 187 10*3/uL (ref 150–400)
RBC: 4.87 MIL/uL (ref 4.22–5.81)
RDW: 13.2 % (ref 11.5–15.5)
WBC: 11.8 10*3/uL — ABNORMAL HIGH (ref 4.0–10.5)
nRBC: 0 % (ref 0.0–0.2)

## 2020-07-17 NOTE — ED Notes (Signed)
Pt did not answer x2 for a room

## 2020-07-17 NOTE — ED Triage Notes (Signed)
Pt reports weight lifting recently and has mid chest wall pain that increases with movement. No distress is noted.

## 2020-07-20 ENCOUNTER — Emergency Department (HOSPITAL_COMMUNITY)
Admission: EM | Admit: 2020-07-20 | Discharge: 2020-07-20 | Disposition: A | Discharge status: Home / Self Care | Payer: Medicaid Other | Attending: Emergency Medicine | Admitting: Emergency Medicine

## 2020-07-20 ENCOUNTER — Other Ambulatory Visit: Payer: Self-pay

## 2020-07-20 ENCOUNTER — Encounter (HOSPITAL_COMMUNITY): Payer: Self-pay | Admitting: Emergency Medicine

## 2020-07-20 DIAGNOSIS — M7918 Myalgia, other site: Secondary | ICD-10-CM | POA: Insufficient documentation

## 2020-07-20 DIAGNOSIS — J45909 Unspecified asthma, uncomplicated: Secondary | ICD-10-CM | POA: Insufficient documentation

## 2020-07-20 DIAGNOSIS — R0789 Other chest pain: Secondary | ICD-10-CM

## 2020-07-20 DIAGNOSIS — Z79899 Other long term (current) drug therapy: Secondary | ICD-10-CM | POA: Diagnosis not present

## 2020-07-20 MED ORDER — IBUPROFEN 600 MG PO TABS: 600.0000 mg | ORAL_TABLET | Freq: Four times a day (QID) | ORAL | 0 refills | Status: DC | PRN

## 2020-07-20 MED ORDER — ACETAMINOPHEN 325 MG PO TABS: 650.0000 mg | ORAL_TABLET | Freq: Four times a day (QID) | ORAL | 0 refills | Status: DC | PRN

## 2020-07-20 NOTE — ED Provider Notes (Signed)
[facility]-EMERGENCY DEPT Provider Note   CSN: [id number] Arrival date & time: 07/20/20  1730     History Chief Complaint  Patient presents with  . Chest Pain    pulled muscle weight lifting last week    Kenneth Welch. is a 34 y.o. male present emerge department chest pain after weightlifting last week.  He reports he did not have pain immediately but woke up the next morning with sharp pain in his right upper sternum, which does not radiate anywhere.  It is never had this kind of pain before.  He says it has been waxing and waning for the past several days, but is worse with arm movements.  He has tried taking 2 Aleve yesterday and did not achieve much relief.  Currently the pain is 5 out of 10 but he says when it flares up he can become 9 out of 10.  He denies any fevers, chills, cough, congestion, history of cardiac disease, history of PE or DVT.  The patient initially came to our ER on August 13 for the same complaint, had chest x-ray ordered out of triage, but left prior to being seen.  Review of the medical record shows the x-ray showed clear lung fields with no sign of pneumothorax.  HPI     Past Medical History:  Diagnosis Date  . Asthma   . Schizophrenia [facility])     Patient Active Problem List   Diagnosis Date Noted  . Schizoaffective disorder, unspecified condition 04/10/2012    History reviewed. No pertinent surgical history.     No family history on file.  Social History   Tobacco Use  . Smoking status: Never Smoker  . Smokeless tobacco: Never Used  Vaping Use  . Vaping Use: Never used  Substance Use Topics  . Alcohol use: Never  . Drug use: Never    Home Medications Prior to Admission medications   Medication Sig Start Date End Date Taking? Authorizing Provider  acetaminophen (TYLENOL) 325 MG tablet Take 2 tablets (650 mg total) by mouth every 6 (six) hours as needed for mild pain or moderate pain. 07/20/20   Terald Sleeper, MD  ARIPiprazole (ABILIFY) 10 MG tablet Take 1 tablet (10 mg total) by mouth daily. 07/09/20   Arfeen, Phillips Grout, MD  benztropine (COGENTIN) 0.5 MG tablet Take 1 tablet (0.5 mg total) by mouth at bedtime. 07/09/20   Arfeen, Phillips Grout, MD  cetirizine (ZYRTEC) 10 MG tablet Take 10 mg by mouth every evening. 04/22/19   [provider]  ibuprofen (ADVIL) 600 MG tablet Take 1 tablet (600 mg total) by mouth every 6 (six) hours as needed. Patient not taking: Reported on 07/17/2020 04/25/20   Elpidio Anis, PA-C  ibuprofen (ADVIL) 600 MG tablet Take 1 tablet (600 mg total) by mouth every 6 (six) hours as needed for up to 30 doses for mild pain or moderate pain. 07/20/20   Terald Sleeper, MD  methocarbamol (ROBAXIN) 750 MG tablet Take 1 tablet (750 mg total) by mouth 3 (three) times daily as needed (muscle spasm/pain). 05/03/19   Cathren Laine, MD  Multiple Vitamin (MULTIVITAMIN WITH MINERALS) TABS tablet Take 1 tablet by mouth daily. Patient not taking: Reported on 07/17/2020    [provider]  traZODone (DESYREL) 50 MG tablet TAKE 1 TABLET (50 MG TOTAL) BY MOUTH AS NEEDED AT BEDTIME. Patient taking differently: Take 50 mg by mouth at bedtime as needed for sleep.  04/17/18   Arfeen,  Phillips Grout, MD    Allergies    Patient has no known allergies.  Review of Systems   Review of Systems  Constitutional: Negative for chills and fever.  HENT: Negative for ear pain and sore throat.   Eyes: Negative for photophobia and visual disturbance.  Respiratory: Negative for cough and shortness of breath.   Cardiovascular: Positive for chest pain. Negative for palpitations and leg swelling.  Gastrointestinal: Negative for abdominal pain, nausea and vomiting.  Genitourinary: Negative for dysuria and hematuria.  Musculoskeletal: Positive for arthralgias and myalgias.  Skin: Negative for color change and rash.  Neurological: Negative for syncope and headaches.  Psychiatric/Behavioral: Negative for  agitation and confusion.  All other systems reviewed and are negative.   Physical Exam Updated Vital Signs BP 130/80   Pulse 83   Temp 98.7 F (37.1 C) (Oral)   Resp 16   SpO2 98%   Physical Exam Vitals and nursing note reviewed.  Constitutional:      Appearance: He is well-developed.  HENT:     Head: Normocephalic and atraumatic.  Eyes:     Conjunctiva/sclera: Conjunctivae normal.  Cardiovascular:     Rate and Rhythm: Normal rate and regular rhythm.     Heart sounds: No murmur heard.   Pulmonary:     Effort: Pulmonary effort is normal. No respiratory distress.     Breath sounds: Normal breath sounds.  Chest:     Chest wall: Tenderness present.  Abdominal:     Palpations: Abdomen is soft.     Tenderness: There is no abdominal tenderness.  Musculoskeletal:        General: Normal range of motion.     Cervical back: Neck supple.     Right lower leg: No tenderness.     Left lower leg: No tenderness.  Skin:    General: Skin is warm and dry.  Neurological:     General: No focal deficit present.     Mental Status: He is alert and oriented to person, place, and time.     Motor: No weakness.  Psychiatric:        Mood and Affect: Mood normal.        Behavior: Behavior normal.     ED Results / Procedures / Treatments   Labs (all labs ordered are listed, but only abnormal results are displayed) Labs Reviewed - No data to display  EKG None  Radiology No results found.  Procedures Procedures (including critical care time)  Medications Ordered in ED Medications - No data to display  ED Course  I have reviewed the triage vital signs and the nursing notes.  Pertinent labs & imaging results that were available during my care of the patient were reviewed by me and considered in my medical decision making (see chart for details).  34 yo male presenting to ED with chest pain for 1 week after heavy lifting.  He is overall well-appearing.  His pain is extremely  reproducible with arm movement and to tenderness on exam.  He may have a costochondritis or inflammation of the intercostal muscles.  I have a much lower suspicion for acute aortic dissection or tension pneumothorax.  He had an x-ray done 3 days ago when he initially came to the ER which did not show any evidence of either of these pathologies.  He has got good breath sounds bilaterally.  Talked about Tylenol with Motrin as well as Lidoderm patches for his pain.  Advised that he refrain from doing his  chest exercises for another 2 weeks until he has recovered.  He verbalized understanding.    Final Clinical Impression(s) / ED Diagnoses Final diagnoses:  Chest wall pain    Rx / DC Orders ED Discharge Orders         Ordered    ibuprofen (ADVIL) 600 MG tablet  Every 6 hours PRN     Discontinue  Reprint     07/20/20 1909    acetaminophen (TYLENOL) 325 MG tablet  Every 6 hours PRN     Discontinue  Reprint     07/20/20 1909           Terald Sleeper, MD 07/20/20 1914

## 2020-07-20 NOTE — ED Triage Notes (Signed)
Pt reports last week was weight lifting and pulled muscle in chest. Having mid chest pains with movement.

## 2020-07-20 NOTE — Discharge Instructions (Signed)
You can buy lidoderm or lidocaine pathces, 4% strength, over the counter at the pharmacy.   Ask the pharmacist for help if you need it.

## 2020-10-01 ENCOUNTER — Other Ambulatory Visit: Payer: Self-pay

## 2020-10-01 ENCOUNTER — Telehealth (INDEPENDENT_AMBULATORY_CARE_PROVIDER_SITE_OTHER): Payer: Medicaid Other | Admitting: Psychiatry

## 2020-10-01 ENCOUNTER — Encounter (HOSPITAL_COMMUNITY): Payer: Self-pay | Admitting: Psychiatry

## 2020-10-01 DIAGNOSIS — F259 Schizoaffective disorder, unspecified: Secondary | ICD-10-CM

## 2020-10-01 DIAGNOSIS — F419 Anxiety disorder, unspecified: Secondary | ICD-10-CM | POA: Diagnosis not present

## 2020-10-01 MED ORDER — ARIPIPRAZOLE 10 MG PO TABS: 10.0000 mg | ORAL_TABLET | Freq: Every day | ORAL | 0 refills | Status: DC

## 2020-10-01 MED ORDER — BENZTROPINE MESYLATE 0.5 MG PO TABS: 0.5000 mg | ORAL_TABLET | Freq: Every day | ORAL | 0 refills | Status: DC

## 2020-10-01 NOTE — Progress Notes (Signed)
Virtual Visit via Telephone Note  I connected with Kenneth Welch. on 10/01/20 at 10:40 AM EDT by telephone and verified that I am speaking with the correct person using two identifiers.  Location: Patient: Home Provider: Home Office   I discussed the limitations, risks, security and privacy concerns of performing an evaluation and management service by telephone and the availability of in person appointments. I also discussed with the patient that there may be a patient responsible charge related to this service. The patient expressed understanding and agreed to proceed.   History of Present Illness: Patient is evaluated by phone session.  He admitted there are days when he misses the Abilify and Cogentin because he forgets.  We noticed those days he gets sometimes irritable, frustrated but denies any hallucination, paranoia or any suicidal thoughts.  He is trying to lose weight and so far he had lost few pounds since the last visit.  In August he was seen in the emergency room because of chest pain as he was lifting weight and hurt his shoulder and given muscle relaxant.  He is no longer taking any pain medicine.  Patient lives by himself.  He does not talk as much but he feels medicine helping.  He is hoping to have a trip to [place] with his friend for Thanksgiving.  Patient is not interested in any new relationship.  He has no tremors, shakes or any EPS.  He stays to himself and go outside when he needed.  He admitted sometimes paranoia but when he takes the medicine it goes away.  Patient has a history of poor compliance with medication.  Past Psychiatric History: H/Omultipleinpatient forparanoia, hallucination, psychosis and depression. Triedzyprexa which did not help.   Recent Results (from the past 2160 hour(s))  Basic metabolic panel     Status: None   Collection Time: 07/17/20  9:33 AM  Result Value Ref Range   Sodium 140 135 - 145 mmol/L   Potassium 3.8 3.5 - 5.1  mmol/L   Chloride 102 98 - 111 mmol/L   CO2 26 22 - 32 mmol/L   Glucose, Bld 96 70 - 99 mg/dL    Comment: Glucose reference range applies only to samples taken after fasting for at least 8 hours.   BUN 13 6 - 20 mg/dL   Creatinine, Ser 0.10 0.61 - 1.24 mg/dL   Calcium 27.2 8.9 - 53.6 mg/dL   GFR calc non Af Amer >60 >60 mL/min   GFR calc Af Amer >60 >60 mL/min   Anion gap 12 5 - 15    Comment: Performed at [facility], [address] [place], [place] [address]  CBC     Status: Abnormal   Collection Time: 07/17/20  9:33 AM  Result Value Ref Range   WBC 11.8 (H) 4.0 - 10.5 K/uL   RBC 4.87 4.22 - 5.81 MIL/uL   Hemoglobin 15.4 13.0 - 17.0 g/dL   HCT 47.4 39 - 52 %   MCV 94.9 80.0 - 100.0 fL   MCH 31.6 26.0 - 34.0 pg   MCHC 33.3 30.0 - 36.0 g/dL   RDW 25.9 56.3 - 87.5 %   Platelets 187 150 - 400 K/uL   nRBC 0.0 0.0 - 0.2 %    Comment: Performed at [facility], [address], [place], [place] [address]   Psychiatric Specialty Exam: Physical Exam  Review of Systems  Weight 175 lb (79.4 kg).There is no height or weight on  file to calculate BMI.  General Appearance: NA  Eye Contact:  NA  Speech:  Slow  Volume:  Decreased  Mood:  Irritable  Affect:  NA  Thought Process:  Descriptions of Associations: Intact  Orientation:  Full (Time, Place, and Person)  Thought Content:  poverty of thought content  Suicidal Thoughts:  No  Homicidal Thoughts:  No  Memory:  Immediate;   Good Recent;   Fair Remote;   Fair  Judgement:  Fair  Insight:  Shallow  Psychomotor Activity:  NA  Concentration:  Concentration: Fair and Attention Span: Fair  Recall:  [employer] of Knowledge:  Fair  Language:  Fair  Akathisia:  No  Handed:  Right  AIMS (if indicated):     Assets:  Communication Skills Desire for Improvement Housing  ADL's:  Intact  Cognition:  WNL  Sleep:   fair      Assessment and Plan: Schizoaffective disorder, depressed type.  Anxiety.  Reinforced to take  the medicine as prescribed to avoid relapse.  He admitted when he takes the medicine it does help his symptoms.  His sleep is okay without trazodone.  He is not interested in therapy.  Discussed risk of relapse with noncompliance with medication and patient agreed to take the medicine as prescribed.  Continue Abilify 10 mg daily, Cogentin 0.5 mg at bedtime.  I reviewed blood work results from ER visit in August.  His chemistry is normal.  His WBC count was high.  He is not interested in therapy.  Recommended to call us back if is any question or any concern.  Follow-up in 3 months.  Follow Up Instructions:    I discussed the assessment and treatment plan with the patient. The patient was provided an opportunity to ask questions and all were answered. The patient agreed with the plan and demonstrated an understanding of the instructions.   The patient was advised to call back or seek an in-person evaluation if the symptoms worsen or if the condition fails to improve as anticipated.  I provided 20 minutes of non-face-to-face time during this encounter.   Cleotis Nipper, MD

## 2020-10-09 ENCOUNTER — Telehealth (HOSPITAL_COMMUNITY): Payer: Medicaid Other | Admitting: Psychiatry

## 2020-10-22 ENCOUNTER — Encounter (HOSPITAL_COMMUNITY): Payer: Self-pay | Admitting: Emergency Medicine

## 2020-10-22 ENCOUNTER — Emergency Department (HOSPITAL_COMMUNITY)
Admission: EM | Admit: 2020-10-22 | Discharge: 2020-10-22 | Disposition: A | Discharge status: Home / Self Care | Payer: Medicaid Other | Attending: Emergency Medicine | Admitting: Emergency Medicine

## 2020-10-22 ENCOUNTER — Other Ambulatory Visit: Payer: Self-pay

## 2020-10-22 DIAGNOSIS — J45909 Unspecified asthma, uncomplicated: Secondary | ICD-10-CM | POA: Diagnosis not present

## 2020-10-22 DIAGNOSIS — H66001 Acute suppurative otitis media without spontaneous rupture of ear drum, right ear: Secondary | ICD-10-CM | POA: Diagnosis not present

## 2020-10-22 DIAGNOSIS — H9201 Otalgia, right ear: Secondary | ICD-10-CM | POA: Diagnosis present

## 2020-10-22 MED ORDER — AMOXICILLIN-POT CLAVULANATE 875-125 MG PO TABS
1.0000 | ORAL_TABLET | Freq: Once | ORAL | Status: AC
  Administered 2020-10-22: 1 via ORAL
  Filled 2020-10-22: qty 1

## 2020-10-22 MED ORDER — AMOXICILLIN-POT CLAVULANATE 875-125 MG PO TABS
1.0000 | ORAL_TABLET | Freq: Two times a day (BID) | ORAL | 0 refills | Status: AC
Start: 2020-10-22 — End: 2020-10-29

## 2020-10-22 NOTE — ED Provider Notes (Signed)
[facility] EMERGENCY DEPARTMENT Provider Note   CSN: [id number] Arrival date & time: 10/22/20  1419     History Chief Complaint  Patient presents with  . Otitis Media    Kenneth Welch. is a 34 y.o. male history of schizophrenia and asthma.  Patient presents today for right ear pain onset 6 days ago describes a throbbing pressure constant moderate intensity nonradiating no aggravating or alleviating factors.  Reports history of ear infections as a kid but has not had several years.  Associated with decreased hearing in the right ear and rhinorrhea.  Denies fever/chills, headache, vision changes, trismus, sore throat, dental pain, ear drainage, cough, facial swelling or any additional concerns.  HPI     Past Medical History:  Diagnosis Date  . Asthma   . Schizophrenia [facility])     Patient Active Problem List   Diagnosis Date Noted  . Schizoaffective disorder, unspecified condition 04/10/2012    History reviewed. No pertinent surgical history.     No family history on file.  Social History   Tobacco Use  . Smoking status: Never Smoker  . Smokeless tobacco: Never Used  Vaping Use  . Vaping Use: Never used  Substance Use Topics  . Alcohol use: Never  . Drug use: Never    Home Medications Prior to Admission medications   Medication Sig Start Date End Date Taking? Authorizing Provider  acetaminophen (TYLENOL) 325 MG tablet Take 2 tablets (650 mg total) by mouth every 6 (six) hours as needed for mild pain or moderate pain. 07/20/20  Yes Terald Sleeper, MD  ARIPiprazole (ABILIFY) 10 MG tablet Take 1 tablet (10 mg total) by mouth daily. 10/01/20  Yes Arfeen, Phillips Grout, MD  benztropine (COGENTIN) 0.5 MG tablet Take 1 tablet (0.5 mg total) by mouth at bedtime. 10/01/20  Yes Arfeen, Phillips Grout, MD  ibuprofen (ADVIL) 200 MG tablet Take 200 mg by mouth every 6 (six) hours as needed for moderate pain.   Yes [provider]    amoxicillin-clavulanate (AUGMENTIN) 875-125 MG tablet Take 1 tablet by mouth every 12 (twelve) hours for 7 days. 10/22/20 10/29/20  Bill Salinas, PA-C    Allergies    Patient has no known allergies.  Review of Systems   Review of Systems  Constitutional: Negative for chills and fever.  HENT: Positive for ear pain, hearing loss (Decreased hearing right ear) and rhinorrhea. Negative for ear discharge, facial swelling, sore throat, trouble swallowing and voice change.   Respiratory: Negative.  Negative for cough.   Neurological: Negative.  Negative for headaches.    Physical Exam Updated Vital Signs BP 126/78   Pulse 96   Temp 99 F (37.2 C) (Oral)   Resp 16   Ht 6\' 2"  (1.88 m)   Wt 80.7 kg   SpO2 98%   BMI 22.85 kg/m   Physical Exam Constitutional:      General: He is not in acute distress.    Appearance: Normal appearance. He is well-developed. He is not ill-appearing or diaphoretic.  HENT:     Head: Normocephalic and atraumatic.     Jaw: There is normal jaw occlusion. No trismus.     Right Ear: Ear canal and external ear normal. A middle ear effusion is present. Tympanic membrane is erythematous and bulging. Tympanic membrane is not perforated.     Left Ear: Tympanic membrane, ear canal and external ear normal.     Nose: Rhinorrhea present. Rhinorrhea is clear.  Right Sinus: No maxillary sinus tenderness or frontal sinus tenderness.     Left Sinus: No maxillary sinus tenderness or frontal sinus tenderness.     Mouth/Throat:     Comments: The patient has normal phonation and is in control of secretions. No stridor.  Midline uvula without edema. Soft palate rises symmetrically. No tonsillar erythema, swelling or exudates. Tongue protrusion is normal, floor of mouth is soft. No trismus. No creptius on neck palpation. No gingival erythema or fluctuance noted. Mucus membranes moist. Eyes:     General: Vision grossly intact. Gaze aligned appropriately.     Extraocular  Movements: Extraocular movements intact.     Pupils: Pupils are equal, round, and reactive to light.  Neck:     Trachea: Trachea and phonation normal. No tracheal tenderness or tracheal deviation.     Meningeal: Brudzinski's sign absent.  Pulmonary:     Effort: Pulmonary effort is normal. No respiratory distress.  Abdominal:     General: There is no distension.     Palpations: Abdomen is soft.     Tenderness: There is no abdominal tenderness. There is no guarding or rebound.  Musculoskeletal:        General: Normal range of motion.     Cervical back: Normal range of motion and neck supple.  Skin:    General: Skin is warm and dry.  Neurological:     Mental Status: He is alert.     GCS: GCS eye subscore is 4. GCS verbal subscore is 5. GCS motor subscore is 6.     Comments: Speech is clear and goal oriented, follows commands Major Cranial nerves without deficit, no facial droop Moves extremities without ataxia, coordination intact  Psychiatric:        Behavior: Behavior normal.     ED Results / Procedures / Treatments   Labs (all labs ordered are listed, but only abnormal results are displayed) Labs Reviewed - No data to display  EKG None  Radiology No results found.  Procedures Procedures (including critical care time)  Medications Ordered in ED Medications  amoxicillin-clavulanate (AUGMENTIN) 875-125 MG per tablet 1 tablet (has no administration in time range)    ED Course  I have reviewed the triage vital signs and the nursing notes.  Pertinent labs & imaging results that were available during my care of the patient were reviewed by me and considered in my medical decision making (see chart for details).    MDM Rules/Calculators/A&P                         Additional history obtained from: 1. Nursing notes from this visit. 2. Review of electronic medical records.  No pertinent recent ER visits. ----------------------- 34 year old male presents today with  right ear pain for 6 days.  Examination is consistent with otitis media.  No recent antibiotic use.  Will discharge with Augmentin twice daily x7 days and encourage PCP follow-up.  No evidence of mastoiditis, otitis externa, foreign body, pharyngitis, dental abscess, RPA, Ludwigs, PTA or other emergent pathologies.  At this time there does not appear to be any evidence of an acute emergency medical condition and the patient appears stable for discharge with appropriate outpatient follow up. Diagnosis was discussed with patient who verbalizes understanding of care plan and is agreeable to discharge. I have discussed return precautions with patient who verbalizes understanding. Patient encouraged to follow-up with their PCP. All questions answered.  Note: Portions of this report may have  been transcribed using voice recognition software. Every effort was made to ensure accuracy; however, inadvertent computerized transcription errors may still be present. Final Clinical Impression(s) / ED Diagnoses Final diagnoses:  Non-recurrent acute suppurative otitis media of right ear without spontaneous rupture of tympanic membrane    Rx / DC Orders ED Discharge Orders         Ordered    amoxicillin-clavulanate (AUGMENTIN) 875-125 MG tablet  Every 12 hours        10/22/20 1624           Elizabeth Palau 10/22/20 1625    Milagros Loll, MD 10/23/20 1701

## 2020-10-22 NOTE — Discharge Instructions (Addendum)
At this time there does not appear to be the presence of an emergent medical condition, however there is always the potential for conditions to change. Please read and follow the below instructions.  Please return to the Emergency Department immediately for any new or worsening symptoms. Please be sure to follow up with your Primary Care Provider within one week regarding your visit today; please call their office to schedule an appointment even if you are feeling better for a follow-up visit. Please take your antibiotic Augmentin as prescribed until complete to help with your symptoms.  Please drink enough water to avoid dehydration and get plenty of rest.  Go to the nearest Emergency Department immediately if: You have fever or chills You have pain that is not helped with medicine. You have swelling, redness, or pain around your ear. You get a stiff neck. You cannot move part of your face (paralyzed). You notice that the bone behind your ear hurts when you touch it. You get a very bad headache. You have any new/concerning or worsening of symptoms  Please read the additional information packets attached to your discharge summary.  Do not take your medicine if  develop an itchy rash, swelling in your mouth or lips, or difficulty breathing; call 911 and seek immediate emergency medical attention if this occurs.  You may review your lab tests and imaging results in their entirety on your MyChart account.  Please discuss all results of fully with your primary care provider and other specialist at your follow-up visit.  Note: Portions of this text may have been transcribed using voice recognition software. Every effort was made to ensure accuracy; however, inadvertent computerized transcription errors may still be present.

## 2020-10-22 NOTE — ED Triage Notes (Signed)
Pt endorses right ear infection with decreased hearing since last week.

## 2020-10-28 IMAGING — DX DG CHEST 2V
2 series · 2 of 2 positions shown · non-contrast
Comparison: May 03, 2019 chest radiograph and chest CT

CLINICAL DATA: Chest pain

EXAM:
CHEST - 2 VIEW

[w chest pa]
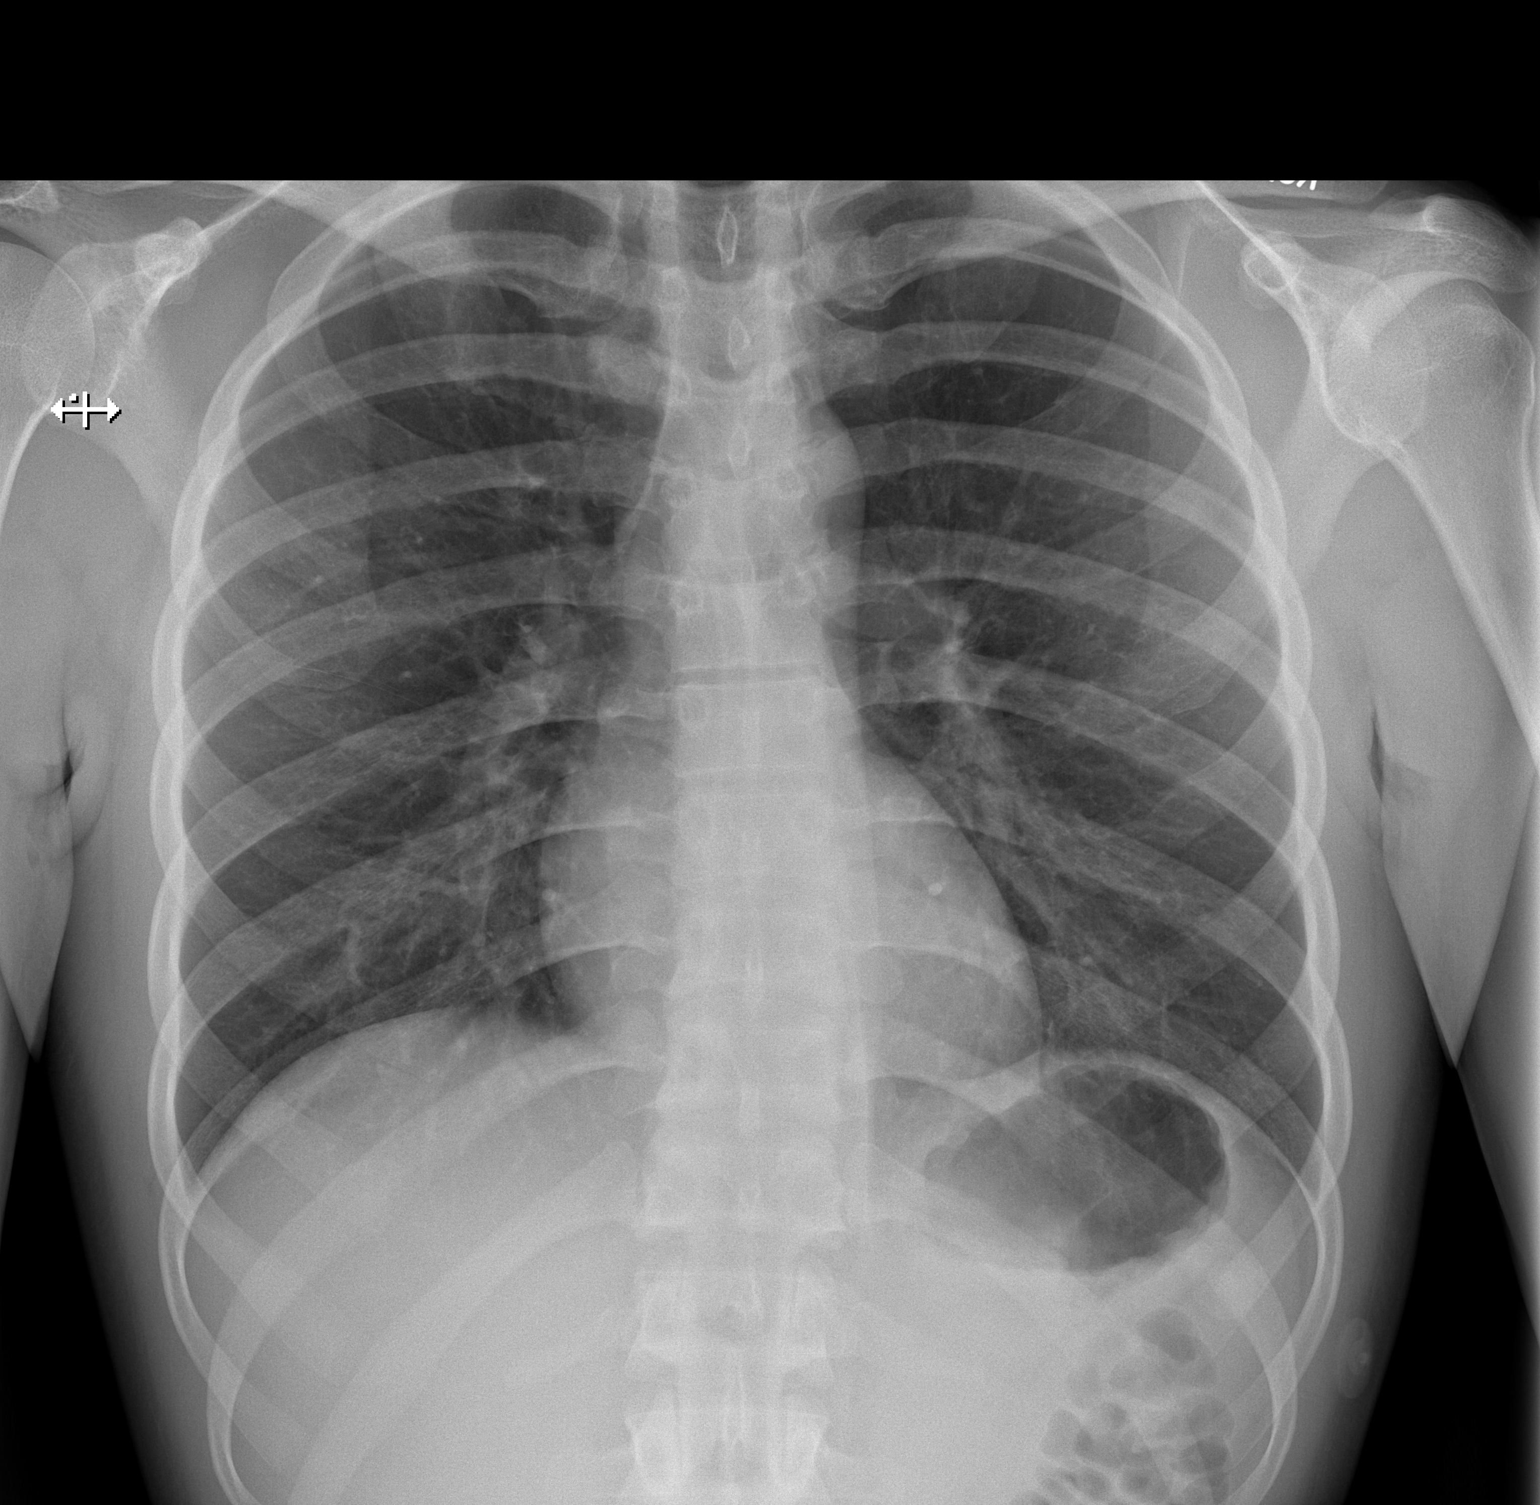

[w chest lat]
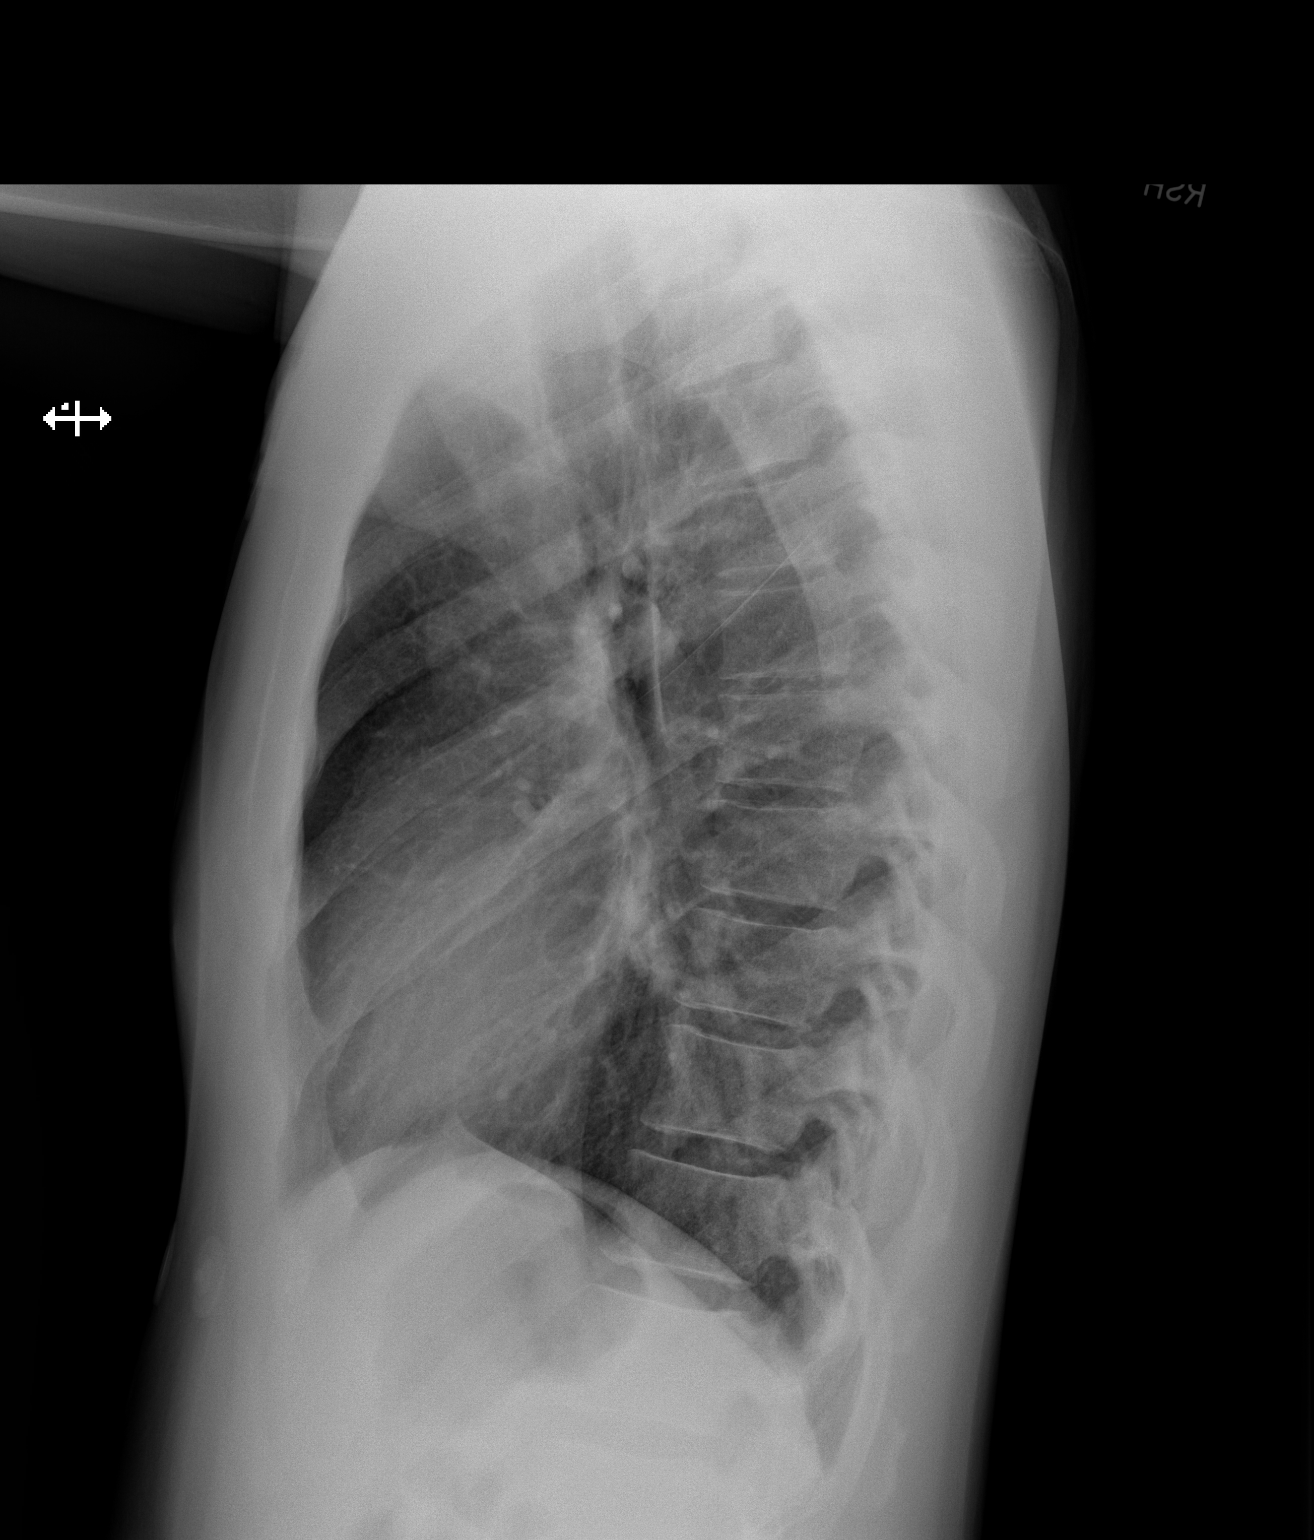

[2 of 2 positions shown; findings below may reference images not displayed]

FINDINGS: Lungs are clear. Heart size and pulmonary vascularity are normal. No
adenopathy. No pneumothorax. No bone lesions.
IMPRESSION: Lungs clear.  Cardiac silhouette normal.

## 2020-12-29 ENCOUNTER — Telehealth (INDEPENDENT_AMBULATORY_CARE_PROVIDER_SITE_OTHER): Payer: Medicaid Other | Admitting: Psychiatry

## 2020-12-29 ENCOUNTER — Other Ambulatory Visit: Payer: Self-pay

## 2020-12-29 ENCOUNTER — Encounter (HOSPITAL_COMMUNITY): Payer: Self-pay | Admitting: Psychiatry

## 2020-12-29 DIAGNOSIS — F419 Anxiety disorder, unspecified: Secondary | ICD-10-CM

## 2020-12-29 DIAGNOSIS — F259 Schizoaffective disorder, unspecified: Secondary | ICD-10-CM

## 2020-12-29 MED ORDER — BENZTROPINE MESYLATE 0.5 MG PO TABS: 0.5000 mg | ORAL_TABLET | Freq: Every day | ORAL | 0 refills | Status: DC

## 2020-12-29 MED ORDER — ARIPIPRAZOLE 10 MG PO TABS: 10.0000 mg | ORAL_TABLET | Freq: Every day | ORAL | 0 refills | Status: DC

## 2020-12-29 NOTE — Progress Notes (Signed)
Virtual Visit via Telephone Note  I connected with Kenneth Welch. on 12/29/20 at  9:40 AM EST by telephone and verified that I am speaking with the correct person using two identifiers.  Location: Patient: Home Provider: Home Office   I discussed the limitations, risks, security and privacy concerns of performing an evaluation and management service by telephone and the availability of in person appointments. I also discussed with the patient that there may be a patient responsible charge related to this service. The patient expressed understanding and agreed to proceed.   History of Present Illness: Patient is evaluated by phone session.  He is on the phone by himself.  Patient told he has been taking the medication more frequently and he noticed it is helping maintain.  He is sleeping good and denies any paranoia, hallucination or any anger.  His Christmas was very quiet.  He was by himself.  He lives by himself and currently he is not in any relationship.  He has a sister who lives here but he is not in touch very frequently.  He is trying to lose weight.  He tried to go outside and walk.  His thought process is slow and he usually answers yes or no.  He is still have some anxiety when he go outside and in public places.  He has no side effects from current medication he like to keep his medication.  Patient has a history of poor compliance with medication resulting increased paranoia.   Past Psychiatric History: H/Omultipleinpatient forparanoia, hallucination, psychosis and depression. Triedzyprexa which did not help.   Psychiatric Specialty Exam: Physical Exam  Review of Systems  Weight 168 lb (76.2 kg).There is no height or weight on file to calculate BMI.  General Appearance: NA  Eye Contact:  NA  Speech:  Slow  Volume:  Decreased  Mood:  Dysphoric  Affect:  NA  Thought Process:  Descriptions of Associations: Intact  Orientation:  Full (Time, Place, and Person)   Thought Content:  poverty of thought content but no hallucination  Suicidal Thoughts:  No  Homicidal Thoughts:  No  Memory:  Immediate;   Good Recent;   Fair Remote;   Fair  Judgement:  Fair  Insight:  Shallow  Psychomotor Activity:  NA  Concentration:  Concentration: Fair and Attention Span: Fair  Recall:  [employer] of Knowledge:  Fair  Language:  Fair  Akathisia:  No  Handed:  Right  AIMS (if indicated):     Assets:  Communication Skills Desire for Improvement Housing  ADL's:  Intact  Cognition:  WNL  Sleep:   fair      Assessment and Plan: Schizoaffective disorder, depressed type.  Anxiety.  Discussed current medication.  Patient is a stable and does not want to change the dose.  He does not need trazodone since sleep is better.  He is not interested in therapy.  Continue Abilify 10 mg daily and Cogentin 0.5 mg at bedtime.  Recommended to call us back if is any question or any concern.  Follow-up in 3 months.  Follow Up Instructions:    I discussed the assessment and treatment plan with the patient. The patient was provided an opportunity to ask questions and all were answered. The patient agreed with the plan and demonstrated an understanding of the instructions.  Command   The patient was advised to call back or seek an in-person evaluation if the symptoms worsen or if the condition fails to improve as  anticipated.  I provided 12 minutes of non-face-to-face time during this encounter.   Cleotis Nipper, MD

## 2021-03-25 ENCOUNTER — Other Ambulatory Visit: Payer: Self-pay

## 2021-03-25 ENCOUNTER — Telehealth (HOSPITAL_COMMUNITY): Payer: Medicaid Other | Admitting: Psychiatry

## 2021-03-28 ENCOUNTER — Emergency Department (HOSPITAL_COMMUNITY)
Admission: EM | Admit: 2021-03-28 | Discharge: 2021-03-28 | Disposition: A | Discharge status: Home / Self Care | Payer: Medicaid Other | Attending: Emergency Medicine | Admitting: Emergency Medicine

## 2021-03-28 ENCOUNTER — Other Ambulatory Visit: Payer: Self-pay

## 2021-03-28 ENCOUNTER — Encounter (HOSPITAL_COMMUNITY): Payer: Self-pay | Admitting: Emergency Medicine

## 2021-03-28 DIAGNOSIS — R5381 Other malaise: Secondary | ICD-10-CM | POA: Insufficient documentation

## 2021-03-28 DIAGNOSIS — R531 Weakness: Secondary | ICD-10-CM | POA: Insufficient documentation

## 2021-03-28 DIAGNOSIS — J45909 Unspecified asthma, uncomplicated: Secondary | ICD-10-CM | POA: Diagnosis not present

## 2021-03-28 DIAGNOSIS — Z20822 Contact with and (suspected) exposure to covid-19: Secondary | ICD-10-CM | POA: Insufficient documentation

## 2021-03-28 DIAGNOSIS — R5383 Other fatigue: Secondary | ICD-10-CM | POA: Insufficient documentation

## 2021-03-28 DIAGNOSIS — R519 Headache, unspecified: Secondary | ICD-10-CM | POA: Insufficient documentation

## 2021-03-28 DIAGNOSIS — R509 Fever, unspecified: Secondary | ICD-10-CM | POA: Insufficient documentation

## 2021-03-28 MED ORDER — IBUPROFEN 800 MG PO TABS
800.0000 mg | ORAL_TABLET | Freq: Once | ORAL | Status: AC
  Administered 2021-03-28: 800 mg via ORAL
  Filled 2021-03-28: qty 1

## 2021-03-28 MED ORDER — ACETAMINOPHEN 500 MG PO TABS
1000.0000 mg | ORAL_TABLET | Freq: Once | ORAL | Status: AC
  Administered 2021-03-28: 1000 mg via ORAL
  Filled 2021-03-28: qty 2

## 2021-03-28 NOTE — ED Triage Notes (Signed)
Pt arrived via POV. Pt states he just returned from a trip to [place] and has been feeling run down and sick for the past few days. Pt states he wants a covid test.

## 2021-03-28 NOTE — Discharge Instructions (Signed)
You have a fever.  Really we do not know the source of your fever.  If you download the MyChart app or have access to it already your results should show up within the next 6 to 24 hours.  If you have COVID 19 infection you will be contacted directly.  This would also be the case if you were positive for acute HIV infection.  Please pay attention to your results. Please take motrin or tylenol to reduce your fever.  Get help right away if: You have shortness of breath or have trouble breathing. You are dizzy or you faint. You are disoriented or confused. You develop signs of dehydration, such as: Dark urine, very little urine, or no urine. Cracked lips. Dry mouth. Sunken eyes. Sleepiness. Weakness. You develop severe pain in your abdomen. You have persistent vomiting or diarrhea. You develop a skin rash. Your symptoms suddenly get worse.

## 2021-03-28 NOTE — ED Provider Notes (Signed)
[facility]-EMERGENCY DEPT Provider Note   CSN: [id number] Arrival date & time: 03/28/21  1724     History Chief Complaint  Patient presents with  . Fever    Kenneth Welch. is a 34 y.o. male who presents with fever and flu like sxs. Patient recently returned from a trip to [place]. He c/o fever, chills, body aches, headache, fatigue, malaise and generalized weakness. He is vaccinated x2 against COVID-19. He had unprotected sexual intercourse 3 days ago. He denies any other sxs and has no other complaints.  HPI     Past Medical History:  Diagnosis Date  . Asthma   . Schizophrenia [facility])     Patient Active Problem List   Diagnosis Date Noted  . Schizoaffective disorder, unspecified condition 04/10/2012    History reviewed. No pertinent surgical history.     History reviewed. No pertinent family history.  Social History   Tobacco Use  . Smoking status: Never Smoker  . Smokeless tobacco: Never Used  Vaping Use  . Vaping Use: Never used  Substance Use Topics  . Alcohol use: Never  . Drug use: Never    Home Medications Prior to Admission medications   Medication Sig Start Date End Date Taking? Authorizing Provider  acetaminophen (TYLENOL) 325 MG tablet Take 2 tablets (650 mg total) by mouth every 6 (six) hours as needed for mild pain or moderate pain. 07/20/20   Terald Sleeper, MD  ARIPiprazole (ABILIFY) 10 MG tablet Take 1 tablet (10 mg total) by mouth daily. 12/29/20   Arfeen, Phillips Grout, MD  benztropine (COGENTIN) 0.5 MG tablet Take 1 tablet (0.5 mg total) by mouth at bedtime. 12/29/20   Arfeen, Phillips Grout, MD  ibuprofen (ADVIL) 200 MG tablet Take 200 mg by mouth every 6 (six) hours as needed for moderate pain.    [provider]    Allergies    Patient has no known allergies.  Review of Systems   Review of Systems Ten systems reviewed and are negative for acute change, except as noted in the HPI.   Physical Exam Updated Vital  Signs BP (!) 102/51   Pulse (!) 110   Temp (!) 101.5 F (38.6 C) (Oral)   Resp 18   Ht 6\' 2"  (1.88 m)   Wt 76.2 kg   SpO2 95%   BMI 21.57 kg/m   Physical Exam Vitals and nursing note reviewed.  Constitutional:      General: He is not in acute distress.    Appearance: He is well-developed. He is ill-appearing. He is not toxic-appearing or diaphoretic.  HENT:     Head: Normocephalic and atraumatic.  Eyes:     General: No scleral icterus.    Conjunctiva/sclera: Conjunctivae normal.  Cardiovascular:     Rate and Rhythm: Normal rate and regular rhythm.     Heart sounds: Normal heart sounds.  Pulmonary:     Effort: Pulmonary effort is normal. No respiratory distress.     Breath sounds: Normal breath sounds.  Abdominal:     Palpations: Abdomen is soft.     Tenderness: There is no abdominal tenderness.  Musculoskeletal:     Cervical back: Normal range of motion and neck supple.  Skin:    General: Skin is warm and dry.  Neurological:     Mental Status: He is alert.  Psychiatric:        Behavior: Behavior normal.     ED Results / Procedures / Treatments   Labs (  all labs ordered are listed, but only abnormal results are displayed) Labs Reviewed  SARS CORONAVIRUS 2 (TAT 6-24 HRS)  RPR  HIV ANTIBODY (ROUTINE TESTING W REFLEX)    EKG None  Radiology No results found.  Procedures Procedures   Medications Ordered in ED Medications  ibuprofen (ADVIL) tablet 800 mg (has no administration in time range)    ED Course  I have reviewed the triage vital signs and the nursing notes.  Pertinent labs & imaging results that were available during my care of the patient were reviewed by me and considered in my medical decision making (see chart for details).    MDM Rules/Calculators/A&P                         And here with fever of unknown origin.  I have ordered a COVID and influenza panel as well as HIV panel which is currently pending along with RPR. Patient achieved  good defervesced since with antipyretic medication.  He may go home with stable vital signs and await outcome of his results.  Patient given return precautions appears otherwise appropriate for discharge at this time Final Clinical Impression(s) / ED Diagnoses Final diagnoses:  Fever, unspecified fever cause    Rx / DC Orders ED Discharge Orders    None       Arthor Captain, PA-C 03/28/21 2132    Tilden Fossa, MD 03/30/21 [id number]

## 2021-03-29 ENCOUNTER — Telehealth: Payer: Self-pay

## 2021-03-29 LAB — RPR: RPR Ser Ql: NONREACTIVE

## 2021-03-29 LAB — SARS CORONAVIRUS 2 (TAT 6-24 HRS): SARS Coronavirus 2: NEGATIVE

## 2021-03-29 LAB — HIV ANTIBODY (ROUTINE TESTING W REFLEX): HIV Screen 4th Generation wRfx: REACTIVE — AB

## 2021-03-29 NOTE — Telephone Encounter (Signed)
Negative COVID results given. Patient results "NOT Detected." Caller expressed understanding. ° °

## 2021-04-02 ENCOUNTER — Encounter (HOSPITAL_COMMUNITY): Payer: Self-pay | Admitting: Psychiatry

## 2021-04-02 ENCOUNTER — Telehealth: Payer: Self-pay | Admitting: Internal Medicine

## 2021-04-02 ENCOUNTER — Telehealth (INDEPENDENT_AMBULATORY_CARE_PROVIDER_SITE_OTHER): Payer: Medicaid Other | Admitting: Psychiatry

## 2021-04-02 ENCOUNTER — Other Ambulatory Visit: Payer: Self-pay

## 2021-04-02 DIAGNOSIS — F419 Anxiety disorder, unspecified: Secondary | ICD-10-CM

## 2021-04-02 DIAGNOSIS — F259 Schizoaffective disorder, unspecified: Secondary | ICD-10-CM | POA: Diagnosis not present

## 2021-04-02 LAB — HIV-1/2 AB - DIFFERENTIATION
HIV 1 Ab: NONREACTIVE
HIV 2 Ab: NONREACTIVE
Note: NEGATIVE

## 2021-04-02 MED ORDER — ARIPIPRAZOLE 10 MG PO TABS: 10.0000 mg | ORAL_TABLET | Freq: Every day | ORAL | 0 refills | Status: DC

## 2021-04-02 MED ORDER — BENZTROPINE MESYLATE 0.5 MG PO TABS: 0.5000 mg | ORAL_TABLET | Freq: Every day | ORAL | 0 refills | Status: DC

## 2021-04-02 NOTE — Progress Notes (Signed)
Virtual Visit via Telephone Note  I connected with Kenneth Welch. on 04/02/21 at  8:40 AM EDT by telephone and verified that I am speaking with the correct person using two identifiers.  Location: Patient: Home Provider: Home Office   I discussed the limitations, risks, security and privacy concerns of performing an evaluation and management service by telephone and the availability of in person appointments. I also discussed with the patient that there may be a patient responsible charge related to this service. The patient expressed understanding and agreed to proceed.   History of Present Illness: Patient is evaluated by phone session.  He missed last appointment.  He recently visited ER because of fever and not feeling well.  He also admitted having unprotected intercourse 3 days ago before the ER visit.  He had a trip to [place].  His covert test was negative and his RPR was also negative.  Patient admitted there are days when he missed taking the medication but otherwise he feels his paranoia is stable.  He usually answer the question either yes or no.  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  He denies any current relationship.  His thought process is slow but denies any delusions, suicidal thoughts or any hallucination.  His appetite is okay.  He sleeps good.  Patient has a history of poorly compliant with medication.  He reported no side effects from medication.  He like to keep his Abilify and Cogentin.   Past Psychiatric History: H/Omultipleinpatient forparanoia, hallucination, psychosis and depression. Triedzyprexa which did not help.  Recent Results (from the past 2160 hour(s))  SARS CORONAVIRUS 2 (TAT 6-24 HRS) Nasopharyngeal Nasopharyngeal Swab     Status: None   Collection Time: 03/28/21  6:06 PM   Specimen: Nasopharyngeal Swab  Result Value Ref Range   SARS Coronavirus 2 NEGATIVE NEGATIVE    Comment: (NOTE) SARS-CoV-2 target nucleic acids are NOT  DETECTED.  The SARS-CoV-2 RNA is generally detectable in upper and lower respiratory specimens during the acute phase of infection. Negative results do not preclude SARS-CoV-2 infection, do not rule out co-infections with other pathogens, and should not be used as the sole basis for treatment or other patient management decisions. Negative results must be combined with clinical observations, patient history, and epidemiological information. The expected result is Negative.  Fact Sheet for Patients: HairSlick.no  Fact Sheet for Healthcare Providers: quierodirigir.com  This test is not yet approved or cleared by the [place] FDA and  has been authorized for detection and/or diagnosis of SARS-CoV-2 by FDA under an Emergency Use Authorization (EUA). This EUA will remain  in effect (meaning this test can be used) for the duration of the COVID-19 declaration under Se ction 564(b)(1) of the Act, 21 U.S.C. section 360bbb-3(b)(1), unless the authorization is terminated or revoked sooner.  Performed at [facility], [address] [place], [place] [address]   RPR     Status: None   Collection Time: 03/28/21  6:19 PM  Result Value Ref Range   RPR Ser Ql NON REACTIVE NON REACTIVE    Comment: Performed at [facility], [address] [place], [place] [address]  HIV Antibody (routine testing w rflx)     Status: Abnormal   Collection Time: 03/28/21  6:19 PM  Result Value Ref Range   HIV Screen 4th Generation wRfx Reactive (A) Non Reactive    Comment: (NOTE) Reactive result does not distinguish HIV-1 p24 antigen, HIV-1  antibody, HIV-2 antibody, and HIV-1 group O antibody. Results  reactive by HIV Antigen/Antibody EIA must be confirmed. Sent for  confirmation. Performed at [facility], [address] [place], [place] [address]      Psychiatric Specialty Exam: Physical Exam  Review of Systems  Weight 168  lb (76.2 kg).Body mass index is 21.57 kg/m.  General Appearance: NA  Eye Contact:  NA  Speech:  Slow  Volume:  Decreased  Mood:  Euthymic  Affect:  NA  Thought Process:  Descriptions of Associations: Intact  Orientation:  Full (Time, Place, and Person)  Thought Content:  poverty of thoght content, slow thought process but no hallucination, delusions  Suicidal Thoughts:  No  Homicidal Thoughts:  No  Memory:  Immediate;   Fair Recent;   Fair Remote;   Fair  Judgement:  Fair  Insight:  Fair  Psychomotor Activity:  Decreased  Concentration:  Concentration: Fair and Attention Span: Fair  Recall:  [employer] of Knowledge:  Fair  Language:  Fair  Akathisia:  No  Handed:  Right  AIMS (if indicated):     Assets:  Communication Skills Desire for Improvement Housing  ADL's:  Intact  Cognition:  WNL  Sleep:   ok      Assessment and Plan:  Schizoaffective disorder, depressed type.  Anxiety.  Patient does not want to change the medication and he feels Abilify Cogentin keeping his thoughts clear and he does not have any paranoia or any hallucination.  He has no side effects.  Continue Abilify 10 mg daily and Cogentin 0.5 mg at bedtime.  I reviewed blood work results from recent ER visit.  He is not interested in therapy.  Recommended to call us back if he has any question or any concern.  Recommend to keep the appointment.  Follow-up in 3 months.  Follow Up Instructions:    I discussed the assessment and treatment plan with the patient. The patient was provided an opportunity to ask questions and all were answered. The patient agreed with the plan and demonstrated an understanding of the instructions.   The patient was advised to call back or seek an in-person evaluation if the symptoms worsen or if the condition fails to improve as anticipated.  I provided 15 minutes of non-face-to-face time during this encounter.   Cleotis Nipper, MD

## 2021-04-02 NOTE — Telephone Encounter (Signed)
Newly dx'ed hiv from [place] ed  ED staff called me  4/24 hiv screen is reactive, ab confirmation pending. High risk patient young male, likely have it  Team, could you please reach out to him and get him a b20 visit next week with any of our b20 providers. By that time the test should be available  thanks

## 2021-04-05 NOTE — Telephone Encounter (Signed)
Called Cell no voice mail   called home rings busy unable to reach

## 2021-04-07 LAB — MISC LABCORP TEST (SEND OUT): Labcorp test code: 83962

## 2021-04-09 NOTE — Telephone Encounter (Signed)
I just called patient again had to leave message to call for appointment

## 2021-04-14 ENCOUNTER — Encounter (HOSPITAL_COMMUNITY): Payer: Self-pay

## 2021-04-14 ENCOUNTER — Emergency Department (HOSPITAL_COMMUNITY)
Admission: EM | Admit: 2021-04-14 | Discharge: 2021-04-14 | Disposition: A | Discharge status: Home / Self Care | Payer: Medicaid Other | Attending: Emergency Medicine | Admitting: Emergency Medicine

## 2021-04-14 ENCOUNTER — Other Ambulatory Visit: Payer: Self-pay

## 2021-04-14 DIAGNOSIS — R000 Tachycardia, unspecified: Secondary | ICD-10-CM | POA: Diagnosis not present

## 2021-04-14 DIAGNOSIS — K6289 Other specified diseases of anus and rectum: Secondary | ICD-10-CM | POA: Diagnosis present

## 2021-04-14 DIAGNOSIS — K625 Hemorrhage of anus and rectum: Secondary | ICD-10-CM | POA: Diagnosis not present

## 2021-04-14 DIAGNOSIS — J45909 Unspecified asthma, uncomplicated: Secondary | ICD-10-CM | POA: Insufficient documentation

## 2021-04-14 DIAGNOSIS — Z202 Contact with and (suspected) exposure to infections with a predominantly sexual mode of transmission: Secondary | ICD-10-CM | POA: Insufficient documentation

## 2021-04-14 DIAGNOSIS — Z711 Person with feared health complaint in whom no diagnosis is made: Secondary | ICD-10-CM

## 2021-04-14 DIAGNOSIS — Z21 Asymptomatic human immunodeficiency virus [HIV] infection status: Secondary | ICD-10-CM | POA: Diagnosis not present

## 2021-04-14 HISTORY — DX: Asymptomatic human immunodeficiency virus (hiv) infection status: Z21

## 2021-04-14 HISTORY — DX: Human immunodeficiency virus (HIV) disease: B20

## 2021-04-14 LAB — CBC WITH DIFFERENTIAL/PLATELET
Abs Immature Granulocytes: 0.03 10*3/uL (ref 0.00–0.07)
Basophils Absolute: 0 10*3/uL (ref 0.0–0.1)
Basophils Relative: 0 %
Eosinophils Absolute: 0 10*3/uL (ref 0.0–0.5)
Eosinophils Relative: 0 %
HCT: 39.1 % (ref 39.0–52.0)
Hemoglobin: 12.9 g/dL — ABNORMAL LOW (ref 13.0–17.0)
Immature Granulocytes: 0 %
Lymphocytes Relative: 23 %
Lymphs Abs: 2.7 10*3/uL (ref 0.7–4.0)
MCH: 31.2 pg (ref 26.0–34.0)
MCHC: 33 g/dL (ref 30.0–36.0)
MCV: 94.4 fL (ref 80.0–100.0)
Monocytes Absolute: 0.7 10*3/uL (ref 0.1–1.0)
Monocytes Relative: 6 %
Neutro Abs: 8 10*3/uL — ABNORMAL HIGH (ref 1.7–7.7)
Neutrophils Relative %: 71 %
Platelets: 319 10*3/uL (ref 150–400)
RBC: 4.14 MIL/uL — ABNORMAL LOW (ref 4.22–5.81)
RDW: 13.9 % (ref 11.5–15.5)
WBC: 11.4 10*3/uL — ABNORMAL HIGH (ref 4.0–10.5)
nRBC: 0 % (ref 0.0–0.2)

## 2021-04-14 LAB — COMPREHENSIVE METABOLIC PANEL
ALT: 20 U/L (ref 0–44)
AST: 15 U/L (ref 15–41)
Albumin: 3.7 g/dL (ref 3.5–5.0)
Alkaline Phosphatase: 40 U/L (ref 38–126)
Anion gap: 6 (ref 5–15)
BUN: 17 mg/dL (ref 6–20)
CO2: 28 mmol/L (ref 22–32)
Calcium: 9.1 mg/dL (ref 8.9–10.3)
Chloride: 103 mmol/L (ref 98–111)
Creatinine, Ser: 0.81 mg/dL (ref 0.61–1.24)
GFR, Estimated: >60 mL/min (ref >60)
Glucose, Bld: 93 mg/dL (ref 70–99)
Potassium: 3.9 mmol/L (ref 3.5–5.1)
Sodium: 137 mmol/L (ref 135–145)
Total Bilirubin: 0.5 mg/dL (ref 0.3–1.2)
Total Protein: 7 g/dL (ref 6.5–8.1)

## 2021-04-14 LAB — POC OCCULT BLOOD, ED: Fecal Occult Bld: POSITIVE — AB

## 2021-04-14 LAB — LIPASE, BLOOD: Lipase: 32 U/L (ref 11–51)

## 2021-04-14 MED ORDER — CEFTRIAXONE SODIUM 1 G IJ SOLR
500.0000 mg | Freq: Once | INTRAMUSCULAR | Status: AC
  Administered 2021-04-14: 500 mg via INTRAMUSCULAR
  Filled 2021-04-14: qty 10

## 2021-04-14 MED ORDER — AZITHROMYCIN 250 MG PO TABS
1000.0000 mg | ORAL_TABLET | Freq: Once | ORAL | Status: AC
  Administered 2021-04-14: 1000 mg via ORAL
  Filled 2021-04-14: qty 4

## 2021-04-14 MED ORDER — LIDOCAINE HCL (PF) 1 % IJ SOLN
1.0000 mL | Freq: Once | INTRAMUSCULAR | Status: AC
  Administered 2021-04-14: 1 mL
  Filled 2021-04-14: qty 30

## 2021-04-14 MED ORDER — SODIUM CHLORIDE 0.9 % IV BOLUS
1000.0000 mL | Freq: Once | INTRAVENOUS | Status: AC
  Administered 2021-04-14: 1000 mL via INTRAVENOUS

## 2021-04-14 NOTE — ED Triage Notes (Signed)
Pt c/o anal pain with BM and red blood x 3 days. Pt states recent Dx HIV positive.

## 2021-04-14 NOTE — Discharge Instructions (Signed)
Please refrain from sex for the next week.  And please follow-up with your primary care provider.  Please follow-up with gastroenterology.  I do need the information for the office for [facility] gastroenterology.  Please call to make an appointment.  In the eyes return to the ER for any new or concerning symptoms specifically if you have any weakness, fatigue, shortness of breath, lightheadedness or passout.  Or if your rectal bleeding worsens.  You do have HIV.  As we discussed is very important for you to stop having unprotected sex.  I have also given you the information for an infectious disease specialist.  I have given you no treatment for gonorrhea chlamydia although you declined testing I think it is reasonable to discuss this treatment with your infectious disease doctor when you follow-up.

## 2021-04-14 NOTE — ED Provider Notes (Signed)
[facility]-EMERGENCY DEPT Provider Note   CSN: [id number] Arrival date & time: 04/14/21  1443     History Chief Complaint  Patient presents with  . Rectal Bleeding  . Rectal Pain    Kenneth Welch. is a 35 y.o. male.  HPI Patient is a 35 year old male with a past medical history significant for recently diagnosed HIV approximately 2 months ago.  He has not yet followed up with infectious disease.  Patient states that he has a homosexual man who engages in anal receptive sex.  He states that on Sunday he engaged in anal receptive sex and Monday morning he woke up and was his first bowel movement had dark red blood in his stool.  He states it was separate from the stool and not mixed in.  He states that he noticed it when he wiped.  He states that there were drops that present in the toilet bowl.  He states he has had 2 more episodes of blood in his stool since Monday.  This prompted him to come to the emergency room.  He denies any chest pain, shortness of breath, lightheadedness, dizziness, weakness or significant fatigue.  He denies any significant abdominal pain although he states he did have some mild aches yesterday that is resolved.  No nausea vomiting or diarrhea.  Notably patient was diagnosed with HIV 2 months ago.  He has not yet followed up with infectious disease he does have a PCP however who he is established with.    Past Medical History:  Diagnosis Date  . Asthma   . HIV (human immunodeficiency virus infection) (HCC)   . Schizophrenia [facility])     Patient Active Problem List   Diagnosis Date Noted  . Schizoaffective disorder, unspecified condition 04/10/2012    History reviewed. No pertinent surgical history.     History reviewed. No pertinent family history.  Social History   Tobacco Use  . Smoking status: Never Smoker  . Smokeless tobacco: Never Used  Vaping Use  . Vaping Use: Never used  Substance Use Topics  . Alcohol  use: Never  . Drug use: Never    Home Medications Prior to Admission medications   Medication Sig Start Date End Date Taking? Authorizing Provider  acetaminophen (TYLENOL) 325 MG tablet Take 2 tablets (650 mg total) by mouth every 6 (six) hours as needed for mild pain or moderate pain. 07/20/20   Terald Sleeper, MD  ARIPiprazole (ABILIFY) 10 MG tablet Take 1 tablet (10 mg total) by mouth daily. 04/02/21   Arfeen, Phillips Grout, MD  benztropine (COGENTIN) 0.5 MG tablet Take 1 tablet (0.5 mg total) by mouth at bedtime. 04/02/21   Arfeen, Phillips Grout, MD  ibuprofen (ADVIL) 200 MG tablet Take 200 mg by mouth every 6 (six) hours as needed for moderate pain.    [provider]    Allergies    Patient has no known allergies.  Review of Systems   Review of Systems  Constitutional: Negative for chills and fever.  HENT: Negative for congestion.   Eyes: Negative for pain.  Respiratory: Negative for cough and shortness of breath.   Cardiovascular: Negative for chest pain and leg swelling.  Gastrointestinal: Positive for anal bleeding, blood in stool and rectal pain. Negative for abdominal pain, diarrhea, nausea and vomiting.  Genitourinary: Negative for dysuria.  Musculoskeletal: Negative for myalgias.  Skin: Negative for rash.  Neurological: Negative for dizziness and headaches.    Physical Exam Updated Vital Signs  BP 122/85   Pulse (!) 102   Temp 98.4 F (36.9 C) (Oral)   Resp 16   Ht 6\' 2"  (1.88 m)   Wt 73 kg   SpO2 100%   BMI 20.67 kg/m   Physical Exam Vitals and nursing note reviewed.  Constitutional:      General: He is not in acute distress.    Comments: Pleasant well-appearing 35 year old man in no acute distress  HENT:     Head: Normocephalic and atraumatic.     Nose: Nose normal.     Mouth/Throat:     Mouth: Mucous membranes are moist.  Eyes:     General: No scleral icterus. Cardiovascular:     Rate and Rhythm: Regular rhythm. Tachycardia present.     Pulses:  Normal pulses.     Heart sounds: Normal heart sounds.     Comments: Mild tachycardia heart rate 104 on my exam No murmurs rubs or gallops Pulmonary:     Effort: Pulmonary effort is normal. No respiratory distress.     Breath sounds: Normal breath sounds. No wheezing.  Abdominal:     Palpations: Abdomen is soft.     Tenderness: There is no abdominal tenderness. There is no right CVA tenderness, left CVA tenderness, guarding or rebound.  Musculoskeletal:     Cervical back: Normal range of motion.     Right lower leg: No edema.     Left lower leg: No edema.  Skin:    General: Skin is warm and dry.     Capillary Refill: Capillary refill takes less than 2 seconds.  Neurological:     Mental Status: He is alert. Mental status is at baseline.  Psychiatric:        Mood and Affect: Mood normal.        Behavior: Behavior normal.     ED Results / Procedures / Treatments   Labs (all labs ordered are listed, but only abnormal results are displayed) Labs Reviewed  CBC WITH DIFFERENTIAL/PLATELET - Abnormal; Notable for the following components:      Result Value   WBC 11.4 (*)    RBC 4.14 (*)    Hemoglobin 12.9 (*)    Neutro Abs 8.0 (*)    All other components within normal limits  POC OCCULT BLOOD, ED - Abnormal; Notable for the following components:   Fecal Occult Bld POSITIVE (*)    All other components within normal limits  COMPREHENSIVE METABOLIC PANEL  LIPASE, BLOOD    EKG None  Radiology No results found.  Procedures Procedures   Medications Ordered in ED Medications  sodium chloride 0.9 % bolus 1,000 mL (0 mLs Intravenous Stopped 04/14/21 1145)  cefTRIAXone (ROCEPHIN) injection 500 mg (500 mg Intramuscular Given 04/14/21 1317)  lidocaine (PF) (XYLOCAINE) 1 % injection 1 mL (1 mL Other Given 04/14/21 1317)  azithromycin (ZITHROMAX) tablet 1,000 mg (1,000 mg Oral Given 04/14/21 1317)    ED Course  I have reviewed the triage vital signs and the nursing  notes.  Pertinent labs & imaging results that were available during my care of the patient were reviewed by me and considered in my medical decision making (see chart for details).    MDM Rules/Calculators/A&P                           Patient is a 35 year old recently diagnosed HIV-positive homosexual man presented today with rectal pain small amount of bright blood blood PR over the  past few days.  He states he has had 4 bowel movements and 3 episodes of bloody stool.  He states that it is a small amount mostly notices it with wiping but he states that a small amount of blood was in the bottom of the toilet when he checked.  Physical exam is notable for scant amount of dark blood in rectum.  I discussed this case with my attending physician who cosigned this note including patient's presenting symptoms, physical exam, and planned diagnostics and interventions. Attending physician stated agreement with plan or made changes to plan which were implemented.   Will obtain orthostatic vital signs and reassess.  Patient has received 1 L normal saline at this time.  Orthostatic vital signs are negative.  Patient's tachycardia has improved somewhat to 102.  He is requesting treatment for gonorrhea chlamydia.  He states he had anal receptive sex Sunday evening without any barrier protection.  We had a lengthy discussion about how is an HIV-positive man he is able to transmit HIV to other people.  He understands this.  He understands that he should follow-up with infectious disease prior to having sex with anyone else.  He is declining any screening for STDs at this time but would like to be treated.  We will provide patient with single dose treatment here in ER.  He was given information for infectious disease.  He will also follow-up with his PCP.  Also given information for gastroenterology.  He is not orthostatic.  Although his hemoglobin has decreased from 2 years ago his hemoglobin is 12.9 he is not  technically anemic.  He will monitor his symptoms return to the ER for any new or concerning symptoms.  His digital rectal exam was notable for small amount of blood but there is no evidence of active bleeding--scant blood was present.  He will follow-up with GI for this.  Strict return precautions were given.  Tachycardia down to 98 bpm/resolved at time of discharge.  Final Clinical Impression(s) / ED Diagnoses Final diagnoses:  Rectal bleeding  Concern about STD in male without diagnosis    Rx / DC Orders ED Discharge Orders    None       Gailen Shelter, [place] 04/14/21 1736    Arby Barrette, MD 04/22/21 [phone]

## 2021-04-15 ENCOUNTER — Telehealth: Payer: Self-pay | Admitting: Internal Medicine

## 2021-04-15 NOTE — Telephone Encounter (Signed)
I spoke to patient - he is aware of appt tomorrow with dr Zenaida Niece dam at 2pm at clinic. Gave address to clinic. Patient was seen in the ed on 5/11. Mentioned that he would be started on ADAP to bring id card.

## 2021-04-16 ENCOUNTER — Encounter: Payer: Self-pay | Admitting: Infectious Disease

## 2021-04-16 ENCOUNTER — Ambulatory Visit (INDEPENDENT_AMBULATORY_CARE_PROVIDER_SITE_OTHER): Payer: Medicaid Other | Admitting: Infectious Disease

## 2021-04-16 ENCOUNTER — Other Ambulatory Visit (HOSPITAL_COMMUNITY): Payer: Self-pay

## 2021-04-16 ENCOUNTER — Other Ambulatory Visit: Payer: Self-pay

## 2021-04-16 ENCOUNTER — Other Ambulatory Visit (HOSPITAL_COMMUNITY)
Admission: RE | Admit: 2021-04-16 | Discharge: 2021-04-16 | Disposition: A | Discharge status: Home / Self Care | Payer: Medicaid Other | Source: Ambulatory Visit | Attending: Infectious Disease | Admitting: Infectious Disease

## 2021-04-16 ENCOUNTER — Telehealth: Payer: Self-pay

## 2021-04-16 VITALS — BP 135/86 | HR 116 | Temp 98.7°F

## 2021-04-16 DIAGNOSIS — R5081 Fever presenting with conditions classified elsewhere: Secondary | ICD-10-CM

## 2021-04-16 DIAGNOSIS — B20 Human immunodeficiency virus [HIV] disease: Secondary | ICD-10-CM | POA: Insufficient documentation

## 2021-04-16 DIAGNOSIS — K625 Hemorrhage of anus and rectum: Secondary | ICD-10-CM | POA: Insufficient documentation

## 2021-04-16 DIAGNOSIS — F250 Schizoaffective disorder, bipolar type: Secondary | ICD-10-CM

## 2021-04-16 HISTORY — DX: Human immunodeficiency virus (HIV) disease: B20

## 2021-04-16 HISTORY — DX: Hemorrhage of anus and rectum: K62.5

## 2021-04-16 LAB — POC COVID19 BINAXNOW: SARS Coronavirus 2 Ag: NEGATIVE

## 2021-04-16 MED ORDER — BIKTARVY 50-200-25 MG PO TABS
1.0000 | ORAL_TABLET | Freq: Every day | ORAL | 11 refills | Status: DC
  Filled 2021-04-16 (×2): qty 30, 30d supply, fill #0
  Filled 2021-05-11 – 2021-07-22 (×5): qty 30, 30d supply, fill #1
  Filled 2021-08-23: qty 30, 30d supply, fill #2
  Filled 2021-10-13: qty 30, 30d supply, fill #3
  Filled 2021-11-15: qty 30, 30d supply, fill #4
  Filled 2021-12-16: qty 30, 30d supply, fill #5

## 2021-04-16 NOTE — Progress Notes (Signed)
Subjective:   Chief complaint: rectal bleeding   Patient ID: Kenneth Becton., male    DOB: 02-16-1986, 35 y.o.   MRN: [id number]   HPI   Kenneth Welch is a 35 year old black man who was recently diagnosed with acute HIV infection.  His symptoms consisted of high fevers body aches, all of his lymph node enlargement as well as bleeding from his rectum.  He had been seen in the ER on April 26with the symptoms and had endorsed having had recent unprotected sex.  Was tested for HIV and fourth-generation came back positive preliminary with discriminatory negative and RNA testing positive.  We had endeavor to get him into our clinic soon as possible but that did not initially happen.  He actually came again to the ER on 11 May complaining of rectal bleeding.  Appears now looking at the chart that he got intramuscular ceftriaxone likely for presumptive treatment of possible gonorrhea.  Today he still has some bleeding from his rectum as well as pain with defecation.  Peers still to be in a bit of shock about his diagnosis of acute HIV infection.  I had a lengthy discussion with him regarding the nature of HIV infection and how it can be easily managed with any number of single tablet regimens that we have and most of these are very well-tolerated.  Also explained to him that people live with HIV in 2022 have essentially the same life expectancy as those who do not have HIV and have the same quality of life provided they can continue to take their medications.  I also told him that nearly all of the patients in this clinic are able to do so.  We went over the issues of stigma that surround this diagnosis that should not surround it and that are the outcome of racism, homophobia and other inequities and prejudice.  Does have comorbid schizoaffective disorder and follows with a psychiatrist that he also [facility].  When he first came to clinic he was still  mentioning that he was having fevers and so we tested him for COVID-19 which was negative.  Expect his fevers were from his acute HIV infection.  Past Medical History:  Diagnosis Date  . Acute HIV infection (HCC) 04/16/2021  . Asthma   . HIV (human immunodeficiency virus infection) (HCC)   . Rectal bleeding 04/16/2021  . Schizophrenia (HCC)     No past surgical history on file.  No family history on file.    Social History   Socioeconomic History  . Marital status: Single    Spouse name: Not on file  . Number of children: Not on file  . Years of education: Not on file  . Highest education level: Not on file  Occupational History  . Not on file  Tobacco Use  . Smoking status: Never Smoker  . Smokeless tobacco: Never Used  Vaping Use  . Vaping Use: Never used  Substance and Sexual Activity  . Alcohol use: Never  . Drug use: Never  . Sexual activity: Not Currently  Other Topics Concern  . Not on file  Social History Narrative   ** Merged History Encounter **       Social Determinants of Health   Financial Resource Strain: Not on file  Food Insecurity: Not on file  Transportation Needs: Not on file  Physical Activity: Not on file  Stress: Not on file  Social Connections: Not on file  No Known Allergies   Current Outpatient Medications:  .  acetaminophen (TYLENOL) 325 MG tablet, Take 2 tablets (650 mg total) by mouth every 6 (six) hours as needed for mild pain or moderate pain., Disp: 30 tablet, Rfl: 0 .  ARIPiprazole (ABILIFY) 10 MG tablet, Take 1 tablet (10 mg total) by mouth daily., Disp: 90 tablet, Rfl: 0 .  benztropine (COGENTIN) 0.5 MG tablet, Take 1 tablet (0.5 mg total) by mouth at bedtime., Disp: 90 tablet, Rfl: 0 .  bictegravir-emtricitabine-tenofovir AF (BIKTARVY) 50-200-25 MG TABS tablet, Take 1 tablet by mouth daily., Disp: 30 tablet, Rfl: 11   Review of Systems  Constitutional: Positive for fatigue and fever. Negative for activity change,  appetite change, chills, diaphoresis and unexpected weight change.  HENT: Negative for congestion, rhinorrhea, sinus pressure, sneezing, sore throat and trouble swallowing.   Eyes: Negative for photophobia and visual disturbance.  Respiratory: Negative for cough, chest tightness, shortness of breath, wheezing and stridor.   Cardiovascular: Negative for chest pain, palpitations and leg swelling.  Gastrointestinal: Positive for rectal pain. Negative for abdominal distention, abdominal pain, anal bleeding, blood in stool, constipation, diarrhea, nausea and vomiting.  Genitourinary: Negative for difficulty urinating, dysuria, flank pain and hematuria.  Musculoskeletal: Negative for arthralgias, back pain, gait problem, joint swelling and myalgias.  Skin: Negative for color change, pallor, rash and wound.  Neurological: Negative for dizziness, tremors, weakness and light-headedness.  Hematological: Negative for adenopathy. Does not bruise/bleed easily.  Psychiatric/Behavioral: Negative for agitation, behavioral problems, confusion, decreased concentration, dysphoric mood and sleep disturbance. The patient is nervous/anxious.        Objective:   Physical Exam Constitutional:      Appearance: He is well-developed.  HENT:     Head: Normocephalic and atraumatic.  Eyes:     Extraocular Movements: Extraocular movements intact.     Conjunctiva/sclera: Conjunctivae normal.  Cardiovascular:     Rate and Rhythm: Normal rate and regular rhythm.  Pulmonary:     Effort: Pulmonary effort is normal. No respiratory distress.     Breath sounds: No wheezing.  Abdominal:     General: There is no distension.     Palpations: Abdomen is soft.  Genitourinary:    Prostate: Normal.     Rectum: Guaiac result positive. Tenderness present. No mass.  Musculoskeletal:        General: No tenderness. Normal range of motion.     Cervical back: Normal range of motion and neck supple.  Skin:    General: Skin is warm  and dry.     Coloration: Skin is not pale.     Findings: No erythema or rash.  Neurological:     Mental Status: He is alert and oriented to person, place, and time.  Psychiatric:        Attention and Perception: Attention and perception normal.        Mood and Affect: Affect is tearful.        Behavior: Behavior normal.        Thought Content: Thought content normal.        Cognition and Memory: Cognition and memory normal.        Judgment: Judgment normal.           Assessment & Plan:   Acute HIV infection: We started him on BIktarvy today after reviewing first line regimens for HIV in "rapid start scenario. I gave him 2 blister packs of 7 pills of BIKTARVY and he took his first dose here.  We  are checking his baseline labs today and he will come back for return labs in 4 weeks time.  I would want to see him in 6 weeks time after he had his labs done but there is no appointments available so he will see Kenneth Welch instead for that visit.    Rectal bleeding: Screen for gonorrhea chlamydia though he actually was treated for gonorrhea in the ER recently.   STI screening: Screening for gonorrhea chlamydia oropharynx rectum and urine.  Syphilis testing  Is affective disorder continue on current medications and follow-up with primary care and psychiatry also get him plugged into [facility] here.  Need for vaccinations  Will need to get his Prevnar 13 vaccine which I intended to give him today but we did not give yet.  I spent greater than with the patient including greater than 50% of time in face to face counsel of the patient regarding the nature of HIV infection the nature of the antivirals would give to treat HIV the way these medications are taken the potential side effects of each medication we considered, U equals U and in coordination of his care.

## 2021-04-16 NOTE — Telephone Encounter (Signed)
[facility] Patient Product/process development scientist completed.    The patient is insured through [facility] and has a $3.00 copay.  Medicaid believes he have another insurance as well Patent attorney)   We will continue to follow to see if copay assistance is needed.  Clearance Coots, CPhT Specialty Pharmacy Patient [facility] for Infectious Disease Phone: [phone] Fax:  [phone]

## 2021-04-18 LAB — URINE CYTOLOGY ANCILLARY ONLY
Chlamydia: POSITIVE — AB
Comment: NEGATIVE
Comment: NORMAL
Neisseria Gonorrhea: NEGATIVE

## 2021-04-18 LAB — CYTOLOGY, (ORAL, ANAL, URETHRAL) ANCILLARY ONLY
Chlamydia: NEGATIVE
Chlamydia: POSITIVE — AB
Comment: NEGATIVE
Comment: NEGATIVE
Comment: NORMAL
Comment: NORMAL
Neisseria Gonorrhea: NEGATIVE
Neisseria Gonorrhea: POSITIVE — AB

## 2021-04-19 ENCOUNTER — Telehealth: Payer: Self-pay

## 2021-04-19 NOTE — Telephone Encounter (Signed)
Attempted to call regarding positive results. Unable to leave VM on secure line.

## 2021-04-19 NOTE — Telephone Encounter (Signed)
-----   Message from Randall Hiss, MD sent at 04/19/2021  8:21 AM EDT ----- Patient + for GC and chlamydia. I cant tell if the chlamydia is in rectum or mouth. He needs 500 mg ceftriaxone once and doxycycline 100mg  bid x 7 vs 21 days (if rectal) ----- Message ----- From: , CMA Sent: 04/16/2021   3:54 PM EDT To: 04/18/2021, MD

## 2021-04-20 ENCOUNTER — Other Ambulatory Visit: Payer: Self-pay

## 2021-04-20 MED ORDER — DOXYCYCLINE HYCLATE 100 MG PO TABS: 100.0000 mg | ORAL_TABLET | Freq: Two times a day (BID) | ORAL | 0 refills | Status: AC

## 2021-04-20 NOTE — Telephone Encounter (Signed)
Patient advised of positive STI results and that he will need 500 mg of ceftriaxone x 1 for gonorrhea and 21 days of doxycycline for chlamydia per Dr. Daiva Eves. Patient advised to abstain from sex until completion of treatment and an additional 10 days after treatment. Patient also advised to notify his partner so they can be treated as well. Patient scheduled for 04/21/21 and verbalized understanding. Doxycycline sent to pharmacy.  Kenneth Welch

## 2021-04-21 ENCOUNTER — Ambulatory Visit: Payer: Medicaid Other

## 2021-04-22 ENCOUNTER — Ambulatory Visit: Payer: Medicaid Other

## 2021-04-22 NOTE — Telephone Encounter (Signed)
I attempted to contact patient today in regards to his missed appointment today for treatment for gonorrhea. Patient did not answer and no secured voicemail setup.  Kenneth Welch T Pricilla Loveless

## 2021-04-26 LAB — QUANTIFERON-TB GOLD PLUS
Mitogen-NIL: >10 IU/mL
NIL: 0.29 IU/mL
QuantiFERON-TB Gold Plus: NEGATIVE
TB1-NIL: 0.07 IU/mL
TB2-NIL: 0.06 IU/mL

## 2021-04-26 LAB — HIV-1 INTEGRASE GENOTYPE

## 2021-04-26 LAB — CBC WITH DIFFERENTIAL/PLATELET
Absolute Monocytes: 581 cells/uL (ref 200–950)
Basophils Absolute: 53 cells/uL (ref 0–200)
Basophils Relative: 0.6 %
Eosinophils Absolute: 79 cells/uL (ref 15–500)
Eosinophils Relative: 0.9 %
HCT: 42.6 % (ref 38.5–50.0)
Hemoglobin: 14.3 g/dL (ref 13.2–17.1)
Lymphs Abs: 2341 cells/uL (ref 850–3900)
MCH: 30.8 pg (ref 27.0–33.0)
MCHC: 33.6 g/dL (ref 32.0–36.0)
MCV: 91.6 fL (ref 80.0–100.0)
MPV: 10.6 fL (ref 7.5–12.5)
Monocytes Relative: 6.6 %
Neutro Abs: 5746 cells/uL (ref 1500–7800)
Neutrophils Relative %: 65.3 %
Platelets: 295 10*3/uL (ref 140–400)
RBC: 4.65 10*6/uL (ref 4.20–5.80)
RDW: 13.1 % (ref 11.0–15.0)
Total Lymphocyte: 26.6 %
WBC: 8.8 10*3/uL (ref 3.8–10.8)

## 2021-04-26 LAB — COMPLETE METABOLIC PANEL WITH GFR
AG Ratio: 1.2 (calc) (ref 1.0–2.5)
ALT: 16 U/L (ref 9–46)
AST: 20 U/L (ref 10–40)
Albumin: 4.2 g/dL (ref 3.6–5.1)
Alkaline phosphatase (APISO): 54 U/L (ref 36–130)
BUN: 17 mg/dL (ref 7–25)
CO2: 31 mmol/L (ref 20–32)
Calcium: 9.5 mg/dL (ref 8.6–10.3)
Chloride: 98 mmol/L (ref 98–110)
Creat: 1.04 mg/dL (ref 0.60–1.35)
GFR, Est African American: 109 mL/min/{1.73_m2} (ref >=60)
GFR, Est Non African American: 94 mL/min/{1.73_m2} (ref >=60)
Globulin: 3.6 g/dL (calc) (ref 1.9–3.7)
Glucose, Bld: 89 mg/dL (ref 65–99)
Potassium: 4.6 mmol/L (ref 3.5–5.3)
Sodium: 136 mmol/L (ref 135–146)
Total Bilirubin: 0.4 mg/dL (ref 0.2–1.2)
Total Protein: 7.8 g/dL (ref 6.1–8.1)

## 2021-04-26 LAB — HIV-1 GENOTYPE: HIV-1 Genotype: DETECTED — AB

## 2021-04-26 LAB — HEPATITIS PANEL, ACUTE
Hep A IgM: NONREACTIVE
Hep B C IgM: NONREACTIVE
Hepatitis B Surface Ag: NONREACTIVE
Hepatitis C Ab: NONREACTIVE
SIGNAL TO CUT-OFF: 0.2 (ref <1.00)

## 2021-04-26 LAB — HIV RNA, RTPCR W/R GT (RTI, PI,INT)
HIV 1 RNA Quant: 985000 copies/mL — ABNORMAL HIGH
HIV-1 RNA Quant, Log: 5.99 Log copies/mL — ABNORMAL HIGH

## 2021-04-26 LAB — T-HELPER CELLS (CD4) COUNT (NOT AT ARMC)
Absolute CD4: 522 cells/uL (ref 490–1740)
CD4 T Helper %: 25 % — ABNORMAL LOW (ref 30–61)
Total lymphocyte count: 2063 cells/uL (ref 850–3900)

## 2021-04-26 LAB — RPR: RPR Ser Ql: NONREACTIVE

## 2021-04-26 LAB — HLA B*5701: HLA-B*5701 w/rflx HLA-B High: NEGATIVE

## 2021-04-26 NOTE — Telephone Encounter (Signed)
Patient rescheduled for Gonorrhea treatment on 04/27/21

## 2021-04-27 NOTE — Telephone Encounter (Signed)
Noted that patient rescheduled for 5/25

## 2021-04-28 ENCOUNTER — Ambulatory Visit (INDEPENDENT_AMBULATORY_CARE_PROVIDER_SITE_OTHER): Payer: Medicaid Other

## 2021-04-28 ENCOUNTER — Other Ambulatory Visit: Payer: Self-pay

## 2021-04-28 DIAGNOSIS — A749 Chlamydial infection, unspecified: Secondary | ICD-10-CM

## 2021-04-28 DIAGNOSIS — A549 Gonococcal infection, unspecified: Secondary | ICD-10-CM

## 2021-04-28 MED ORDER — CEFTRIAXONE SODIUM 500 MG IJ SOLR
500.0000 mg | Freq: Once | INTRAMUSCULAR | Status: AC
  Administered 2021-04-28: 500 mg via INTRAMUSCULAR

## 2021-05-11 ENCOUNTER — Other Ambulatory Visit (HOSPITAL_COMMUNITY): Payer: Self-pay

## 2021-05-12 ENCOUNTER — Encounter: Payer: Self-pay | Admitting: Infectious Disease

## 2021-05-12 ENCOUNTER — Other Ambulatory Visit (HOSPITAL_COMMUNITY): Payer: Self-pay

## 2021-05-13 ENCOUNTER — Other Ambulatory Visit (HOSPITAL_COMMUNITY): Payer: Self-pay

## 2021-05-14 ENCOUNTER — Other Ambulatory Visit (HOSPITAL_COMMUNITY): Payer: Self-pay

## 2021-05-14 MED ORDER — BIKTARVY 50-200-25 MG PO TABS
1.0000 | ORAL_TABLET | Freq: Every day | ORAL | 0 refills | Status: DC
  Filled 2021-06-08: qty 30, 30d supply, fill #0

## 2021-05-17 ENCOUNTER — Other Ambulatory Visit (HOSPITAL_COMMUNITY): Payer: Self-pay

## 2021-05-25 ENCOUNTER — Other Ambulatory Visit (HOSPITAL_COMMUNITY): Payer: Self-pay

## 2021-05-31 ENCOUNTER — Ambulatory Visit: Payer: Medicaid Other | Admitting: Infectious Diseases

## 2021-06-08 ENCOUNTER — Other Ambulatory Visit (HOSPITAL_COMMUNITY): Payer: Self-pay

## 2021-06-10 ENCOUNTER — Other Ambulatory Visit (HOSPITAL_COMMUNITY): Payer: Self-pay

## 2021-06-24 ENCOUNTER — Other Ambulatory Visit (HOSPITAL_COMMUNITY): Payer: Self-pay

## 2021-06-24 ENCOUNTER — Other Ambulatory Visit: Payer: Self-pay

## 2021-06-24 ENCOUNTER — Telehealth: Payer: Self-pay

## 2021-06-24 DIAGNOSIS — B20 Human immunodeficiency virus [HIV] disease: Secondary | ICD-10-CM

## 2021-06-24 DIAGNOSIS — Z113 Encounter for screening for infections with a predominantly sexual mode of transmission: Secondary | ICD-10-CM

## 2021-06-24 DIAGNOSIS — Z79899 Other long term (current) drug therapy: Secondary | ICD-10-CM

## 2021-06-24 NOTE — Telephone Encounter (Signed)
Called patient to reschedule missed appointment and get him set up with MyChart. He denies any issues getting or taking his Biktarvy.   Confirmed with [facility] that [name] has either been shipped or picked up by patient since May.   Sandie Ano, RN

## 2021-06-28 ENCOUNTER — Other Ambulatory Visit: Payer: Self-pay

## 2021-07-01 ENCOUNTER — Telehealth (INDEPENDENT_AMBULATORY_CARE_PROVIDER_SITE_OTHER): Payer: Medicaid Other | Admitting: Psychiatry

## 2021-07-01 ENCOUNTER — Encounter (HOSPITAL_COMMUNITY): Payer: Self-pay | Admitting: Psychiatry

## 2021-07-01 ENCOUNTER — Other Ambulatory Visit: Payer: Self-pay

## 2021-07-01 DIAGNOSIS — F259 Schizoaffective disorder, unspecified: Secondary | ICD-10-CM | POA: Diagnosis not present

## 2021-07-01 DIAGNOSIS — F419 Anxiety disorder, unspecified: Secondary | ICD-10-CM | POA: Diagnosis not present

## 2021-07-01 MED ORDER — BENZTROPINE MESYLATE 0.5 MG PO TABS: 0.5000 mg | ORAL_TABLET | Freq: Every day | ORAL | 0 refills | Status: DC

## 2021-07-01 MED ORDER — ARIPIPRAZOLE 10 MG PO TABS: 10.0000 mg | ORAL_TABLET | Freq: Every day | ORAL | 0 refills | Status: DC

## 2021-07-01 NOTE — Progress Notes (Signed)
Virtual Visit via Telephone Note  I connected with Kenneth Welch. on 07/01/21 at  2:40 PM EDT by telephone and verified that I am speaking with the correct person using two identifiers.  Location: Patient: Home Provider: Home Office   I discussed the limitations, risks, security and privacy concerns of performing an evaluation and management service by telephone and the availability of in person appointments. I also discussed with the patient that there may be a patient responsible charge related to this service. The patient expressed understanding and agreed to proceed.   History of Present Illness: Patient is evaluated by phone session.  He has been noncompliant with his medication for 10 days because he does not have money and tried to pick up the medicine from the pharmacy.  He has asked help from his sister but she refused.  Patient admitted that he does not feel a big difference without medicine but reported sometimes anxiety and nervousness.  He has a plan to pick up the medication next week when he gets paid.  He has chronic paranoia but stable.  He denies any suicidal thoughts or homicidal thoughts.  Patient has a history of poor compliance with medication.  He had a visit in the ER because of rectal pain which he feels not resolved completely but he has again no money to go back to see his physician.  He do not recall any side effects from the medication when he was taking.  He like to keep the current medication with his Abilify and Cogentin.  His appetite is okay.  His weight is stable.  Past Psychiatric History:  H/O multiple inpatient for paranoia, hallucination, psychosis and depression. Tried zyprexa which did not help.   Psychiatric Specialty Exam: Physical Exam  Review of Systems  Weight 162 lb (73.5 kg).There is no height or weight on file to calculate BMI.  General Appearance: NA  Eye Contact:  NA  Speech:  Slow  Volume:  Decreased  Mood:  Anxious  Affect:  NA   Thought Process:  Descriptions of Associations: Intact  Orientation:  Full (Time, Place, and Person)  Thought Content:  Paranoid Ideation and Rumination  Suicidal Thoughts:  No  Homicidal Thoughts:  No  Memory:  Immediate;   Fair Recent;   Fair Remote;   Fair  Judgement:  Fair  Insight:  Shallow  Psychomotor Activity:  NA  Concentration:  Concentration: Fair and Attention Span: Fair  Recall:  [employer] of Knowledge:  Fair  Language:  Fair  Akathisia:  No  Handed:  Right  AIMS (if indicated):     Assets:  Communication Skills Desire for Improvement Housing  ADL's:  Intact  Cognition:  WNL  Sleep:   fair      Assessment and Plan: Schizoaffective disorder, depressed type.  Anxiety.  Encouraged to take the medicine on time and consider contacting insurance company if they allow him to get medication through mail.  Patient agreed to give them a call but he has a plan to pick up his medication next week.  Like to continue Abilify 10 mg and Cogentin 0.5 mg.  I reviewed her records from the emergency room.  Patient is not interested in therapy.  Recommended to call us back if there is any question or any concern.  Follow-up in 3 months.  Follow Up Instructions:    I discussed the assessment and treatment plan with the patient. The patient was provided an opportunity to ask questions and all  were answered. The patient agreed with the plan and demonstrated an understanding of the instructions.   The patient was advised to call back or seek an in-person evaluation if the symptoms worsen or if the condition fails to improve as anticipated.  I provided 13 minutes of non-face-to-face time during this encounter.   Cleotis Nipper, MD

## 2021-07-06 ENCOUNTER — Other Ambulatory Visit (HOSPITAL_COMMUNITY): Payer: Self-pay

## 2021-07-13 ENCOUNTER — Other Ambulatory Visit (HOSPITAL_COMMUNITY)
Admission: RE | Admit: 2021-07-13 | Discharge: 2021-07-13 | Disposition: A | Discharge status: Home / Self Care | Payer: Medicaid Other | Source: Ambulatory Visit | Attending: Infectious Disease | Admitting: Infectious Disease

## 2021-07-13 ENCOUNTER — Other Ambulatory Visit: Payer: Self-pay

## 2021-07-13 ENCOUNTER — Encounter: Payer: Self-pay | Admitting: Infectious Disease

## 2021-07-13 ENCOUNTER — Ambulatory Visit (INDEPENDENT_AMBULATORY_CARE_PROVIDER_SITE_OTHER): Payer: Medicaid Other | Admitting: Infectious Disease

## 2021-07-13 VITALS — Wt 165.0 lb

## 2021-07-13 DIAGNOSIS — R198 Other specified symptoms and signs involving the digestive system and abdomen: Secondary | ICD-10-CM

## 2021-07-13 DIAGNOSIS — A749 Chlamydial infection, unspecified: Secondary | ICD-10-CM

## 2021-07-13 DIAGNOSIS — R238 Other skin changes: Secondary | ICD-10-CM | POA: Diagnosis not present

## 2021-07-13 DIAGNOSIS — F250 Schizoaffective disorder, bipolar type: Secondary | ICD-10-CM

## 2021-07-13 DIAGNOSIS — B04 Monkeypox: Secondary | ICD-10-CM | POA: Diagnosis not present

## 2021-07-13 DIAGNOSIS — B20 Human immunodeficiency virus [HIV] disease: Secondary | ICD-10-CM

## 2021-07-13 DIAGNOSIS — A549 Gonococcal infection, unspecified: Secondary | ICD-10-CM

## 2021-07-13 DIAGNOSIS — K625 Hemorrhage of anus and rectum: Secondary | ICD-10-CM

## 2021-07-13 HISTORY — DX: Chlamydial infection, unspecified: A74.9

## 2021-07-13 HISTORY — DX: Other specified symptoms and signs involving the digestive system and abdomen: R19.8

## 2021-07-13 HISTORY — DX: Gonococcal infection, unspecified: A54.9

## 2021-07-13 HISTORY — DX: Monkeypox: B04

## 2021-07-13 HISTORY — DX: Other skin changes: R23.8

## 2021-07-13 MED ORDER — CEFTRIAXONE SODIUM 500 MG IJ SOLR
500.0000 mg | Freq: Once | INTRAMUSCULAR | Status: AC
  Administered 2021-07-13: 500 mg via INTRAMUSCULAR

## 2021-07-13 NOTE — Progress Notes (Signed)
Subjective:   Chief complaint: rectal bleeding and pain with defecation   Patient ID: Kenneth Welch., male    DOB: 1985/12/14, 35 y.o.   MRN: [id number]   HPI   [name] is a 35 year old Black man diagnosed this Spring with acute HIV. He had been seen in ER and treated presumptively for [facility] when HIV test came back positive. At that time he was also having rectal bleeding.   When I saw him in May we started [facility] and he was again + for [facility] and chlamydia and we were able to bring him back for IM ceftriaxone and he was rx doxycline.   He has missed at least one scheduled visit with me.   He does not have a car and walked to clinic. He says there is not a bus line near him.   Today he is again complaining of rectal pain with defecation and blood per rectum with defecation x 3 weeks.  He has been taking his Biktarvy but not consistently.  On exam he has multiple papular lesions around his rectum.     Past Medical History:  Diagnosis Date   Acute HIV infection (HCC) 04/16/2021   Asthma    HIV (human immunodeficiency virus infection) (HCC)    Rectal bleeding 04/16/2021   Schizophrenia (HCC)     No past surgical history on file.  No family history on file.    Social History   Socioeconomic History   Marital status: Single    Spouse name: Not on file   Number of children: Not on file   Years of education: Not on file   Highest education level: Not on file  Occupational History   Not on file  Tobacco Use   Smoking status: Never   Smokeless tobacco: Never  Vaping Use   Vaping Use: Never used  Substance and Sexual Activity   Alcohol use: Never   Drug use: Never   Sexual activity: Not Currently  Other Topics Concern   Not on file  Social History Narrative   ** Merged History Encounter **       Social Determinants of Health   Financial Resource Strain: Not on file  Food Insecurity: Not on file  Transportation Needs: Not on file  Physical Activity:  Not on file  Stress: Not on file  Social Connections: Not on file    No Known Allergies   Current Outpatient Medications:    acetaminophen (TYLENOL) 325 MG tablet, Take 2 tablets (650 mg total) by mouth every 6 (six) hours as needed for mild pain or moderate pain., Disp: 30 tablet, Rfl: 0   ARIPiprazole (ABILIFY) 10 MG tablet, Take 1 tablet (10 mg total) by mouth daily., Disp: 90 tablet, Rfl: 0   benztropine (COGENTIN) 0.5 MG tablet, Take 1 tablet (0.5 mg total) by mouth at bedtime., Disp: 90 tablet, Rfl: 0   bictegravir-emtricitabine-tenofovir AF (BIKTARVY) 50-200-25 MG TABS tablet, Take 1 tablet by mouth daily., Disp: 30 tablet, Rfl: 11   bictegravir-emtricitabine-tenofovir AF (BIKTARVY) 50-200-25 MG TABS tablet, Take 1 tablet by mouth daily, Disp: 30 tablet, Rfl: 0   Review of Systems  Constitutional:  Negative for activity change, appetite change, chills, diaphoresis, fatigue, fever and unexpected weight change.  HENT:  Negative for congestion, rhinorrhea, sinus pressure, sneezing, sore throat and trouble swallowing.   Eyes:  Negative for photophobia and visual disturbance.  Respiratory:  Negative for cough, chest tightness, shortness of breath, wheezing and stridor.   Cardiovascular:  Negative for chest pain, palpitations and leg swelling.  Gastrointestinal:  Positive for anal bleeding and rectal pain. Negative for abdominal distention, abdominal pain, blood in stool, constipation, diarrhea, nausea and vomiting.  Genitourinary:  Negative for difficulty urinating, dysuria, flank pain, hematuria, penile pain, penile swelling and scrotal swelling.  Musculoskeletal:  Negative for arthralgias, back pain, gait problem, joint swelling and myalgias.  Skin:  Negative for color change, pallor, rash and wound.  Neurological:  Negative for dizziness, tremors, weakness and light-headedness.  Hematological:  Negative for adenopathy. Does not bruise/bleed easily.  Psychiatric/Behavioral:  Negative  for agitation, behavioral problems, confusion, decreased concentration, dysphoric mood and sleep disturbance.       Objective:   Physical Exam Vitals reviewed. Exam conducted with a chaperone present.  Constitutional:      Appearance: He is well-developed.  HENT:     Head: Normocephalic and atraumatic.  Eyes:     Conjunctiva/sclera: Conjunctivae normal.  Cardiovascular:     Rate and Rhythm: Normal rate and regular rhythm.  Pulmonary:     Effort: Pulmonary effort is normal. No respiratory distress.     Breath sounds: No wheezing.  Abdominal:     General: There is no distension.     Palpations: Abdomen is soft.  Genitourinary:    Pubic Area: No rash or pubic lice.      Penis: Circumcised. No tenderness, discharge, swelling or lesions.      Testes: Normal.     Prostate: Not enlarged and no nodules present.     Rectum: Tenderness present. No mass, anal fissure or internal hemorrhoid. Normal anal tone.  Musculoskeletal:        General: No tenderness. Normal range of motion.     Cervical back: Normal range of motion and neck supple.  Skin:    General: Skin is warm and dry.     Coloration: Skin is not pale.     Findings: No erythema or rash.  Neurological:     General: No focal deficit present.     Mental Status: He is alert and oriented to person, place, and time.  Psychiatric:        Attention and Perception: Attention normal.        Mood and Affect: Mood is anxious.        Behavior: Behavior normal.        Thought Content: Thought content normal.        Judgment: Judgment normal.    At least 9 pox like papules surrounding rectum, with largest measuring I spent more than 35 minutes with the patient including face to face counseling of the patient personally reviewing radiographs, along with pertinent laboratory microbiological, virological data review of medical records before and during the visit and in coordination of his care.0.3 x0.3 cm in greatest dimensions   07/13/2021  oropharynx      07/13/2021 buttocks          Assessment & Plan:   New Multiple papular lesions on buttocks concerning for Monkeypox (he was not aware of them being here)  I have sent swabs x 2 from 2 lesions for Monkeypox PCR  I do not think what I observed in oropharynx was MP. It would seem unlikely for rectal pain x 3 weeks to be attributable to MP but not impossible  We will follow up PCR testing and his symptoms  Certainly  if rectal pain not improving will treat him with TPOXX  Rectal pain with blood per rectum and prior GC  infection  We gave him 2.4 MU of PCN today as well as   500 mg of Ceftriaxone  Swabs from rectum sent for GC and chlamydia  We also took urine and OP swabs for GC and chlamydia  RPR sent on serum  HIV disease: emphasized utmost need to be highly adherent.  I am continuing rx for Biktarvy and I am checking HIV VL, CD4 CMP, CBC w diff today  Schizoaffective disorder: Dr. Sheela Stack notes reviewed and patient will continue with Abilify and cogentin  Need for vaccinations: still in need for multiple vaccinations   .

## 2021-07-14 LAB — T-HELPER CELL (CD4) - (RCID CLINIC ONLY)
CD4 % Helper T Cell: 31 % — ABNORMAL LOW (ref 33–65)
CD4 T Cell Abs: 813 /uL (ref 400–1790)

## 2021-07-15 LAB — CBC WITH DIFFERENTIAL/PLATELET
Absolute Monocytes: 577 cells/uL (ref 200–950)
Basophils Absolute: 47 cells/uL (ref 0–200)
Basophils Relative: 0.5 %
Eosinophils Absolute: 112 cells/uL (ref 15–500)
Eosinophils Relative: 1.2 %
HCT: 38.9 % (ref 38.5–50.0)
Hemoglobin: 12.7 g/dL — ABNORMAL LOW (ref 13.2–17.1)
Lymphs Abs: 2827 cells/uL (ref 850–3900)
MCH: 30.5 pg (ref 27.0–33.0)
MCHC: 32.6 g/dL (ref 32.0–36.0)
MCV: 93.3 fL (ref 80.0–100.0)
MPV: 10.3 fL (ref 7.5–12.5)
Monocytes Relative: 6.2 %
Neutro Abs: 5738 cells/uL (ref 1500–7800)
Neutrophils Relative %: 61.7 %
Platelets: 349 10*3/uL (ref 140–400)
RBC: 4.17 10*6/uL — ABNORMAL LOW (ref 4.20–5.80)
RDW: 14.3 % (ref 11.0–15.0)
Total Lymphocyte: 30.4 %
WBC: 9.3 10*3/uL (ref 3.8–10.8)

## 2021-07-15 LAB — COMPLETE METABOLIC PANEL WITH GFR
AG Ratio: 0.9 (calc) — ABNORMAL LOW (ref 1.0–2.5)
ALT: 9 U/L (ref 9–46)
AST: 14 U/L (ref 10–40)
Albumin: 4.1 g/dL (ref 3.6–5.1)
Alkaline phosphatase (APISO): 57 U/L (ref 36–130)
BUN: 18 mg/dL (ref 7–25)
CO2: 29 mmol/L (ref 20–32)
Calcium: 9.6 mg/dL (ref 8.6–10.3)
Chloride: 102 mmol/L (ref 98–110)
Creat: 1.11 mg/dL (ref 0.60–1.26)
Globulin: 4.7 g/dL (calc) — ABNORMAL HIGH (ref 1.9–3.7)
Glucose, Bld: 83 mg/dL (ref 65–99)
Potassium: 4.7 mmol/L (ref 3.5–5.3)
Sodium: 136 mmol/L (ref 135–146)
Total Bilirubin: 0.3 mg/dL (ref 0.2–1.2)
Total Protein: 8.8 g/dL — ABNORMAL HIGH (ref 6.1–8.1)
eGFR: 90 mL/min/{1.73_m2} (ref >=60)

## 2021-07-15 LAB — URINE CYTOLOGY ANCILLARY ONLY
Chlamydia: NEGATIVE
Comment: NEGATIVE
Comment: NORMAL
Neisseria Gonorrhea: NEGATIVE

## 2021-07-15 LAB — HIV-1 RNA QUANT-NO REFLEX-BLD
HIV 1 RNA Quant: 21 Copies/mL — ABNORMAL HIGH
HIV-1 RNA Quant, Log: 1.33 Log cps/mL — ABNORMAL HIGH

## 2021-07-15 LAB — CYTOLOGY, (ORAL, ANAL, URETHRAL) ANCILLARY ONLY
Chlamydia: NEGATIVE
Chlamydia: POSITIVE — AB
Comment: NEGATIVE
Comment: NEGATIVE
Comment: NORMAL
Comment: NORMAL
Neisseria Gonorrhea: NEGATIVE
Neisseria Gonorrhea: NEGATIVE

## 2021-07-15 LAB — RPR: RPR Ser Ql: NONREACTIVE

## 2021-07-16 ENCOUNTER — Other Ambulatory Visit: Payer: Self-pay

## 2021-07-16 ENCOUNTER — Telehealth: Payer: Self-pay | Admitting: *Deleted

## 2021-07-16 DIAGNOSIS — A749 Chlamydial infection, unspecified: Secondary | ICD-10-CM

## 2021-07-16 MED ORDER — DOXYCYCLINE HYCLATE 100 MG PO TABS: 100.0000 mg | ORAL_TABLET | Freq: Two times a day (BID) | ORAL | 0 refills | Status: DC

## 2021-07-16 NOTE — Progress Notes (Signed)
y

## 2021-07-16 NOTE — Telephone Encounter (Signed)
Left message asking patient to call his doctor's office. Andree Coss, RN

## 2021-07-16 NOTE — Telephone Encounter (Signed)
Patient advised of positive chlamydia results and we are still waiting on MPX results. Patient advised he will need to take doxycycline for 21 days for chlamydia also to let his partner know so they can be treated, which can be here or the health department and to abstain from sex for 7-10 day after treatment.  Kenneth Welch'

## 2021-07-16 NOTE — Telephone Encounter (Signed)
-----   Message from Randall Hiss, MD sent at 07/15/2021  7:16 PM EDT ----- Regarding: FW: Positive Chlamdia but likely MPox I'm guessing  Doxy 100 mg bid x 21 days but may also want to treat monkeypox didn't see test Back ----- Message ----- From: Andree Coss, RN Sent: 07/15/2021   3:54 PM EDT To: Randall Hiss, MD, # Subject: RE: Positive Chlamdia but likely MPox I'm gu#  The chlamydia is on the rectal swab.  Please advise. Andree Coss, RN  ----- Message ----- From: Daiva Eves, Lisette Grinder, MD Sent: 07/15/2021   2:36 PM EDT To: [facility] Triage Nurse Pool Subject: Positive Chlamdia but likely MPox I'm guessi#  Iaan does not have gonorrhea on his tests. He does have chlamydia but I cannot tell if it is from rectum. If so it could be causing his rectal pain and he should get 21 day course of doxycycline 100 mg bid. I don't see the MPox pcr back yet but if the chlamydia is not from rectum I would be in favor of treating him w Tpox. If it is from rectum I would still be considering it esp if his pcr is postive. ----- Message ----- From: Interface, [facility] Results In Sent: 07/13/2021  10:52 PM EDT To: Randall Hiss, MD

## 2021-07-17 LAB — MONKEYPOX VIRUS DNA, QUALITATIVE REAL-TIME PCR
MONKEYPOX VIRUS DNA, QL PCR: NOT DETECTED
MONKEYPOX VIRUS DNA, QL PCR: NOT DETECTED
Orthopoxvirus DNA, QL PCR: NOT DETECTED
Orthopoxvirus DNA, QL PCR: NOT DETECTED

## 2021-07-19 ENCOUNTER — Other Ambulatory Visit (HOSPITAL_COMMUNITY): Payer: Self-pay

## 2021-07-22 ENCOUNTER — Other Ambulatory Visit (HOSPITAL_COMMUNITY): Payer: Self-pay

## 2021-07-26 ENCOUNTER — Ambulatory Visit: Payer: Medicaid Other | Admitting: Infectious Disease

## 2021-08-01 ENCOUNTER — Encounter (HOSPITAL_COMMUNITY): Payer: Self-pay

## 2021-08-01 ENCOUNTER — Other Ambulatory Visit: Payer: Self-pay

## 2021-08-01 ENCOUNTER — Emergency Department (HOSPITAL_COMMUNITY)
Admission: EM | Admit: 2021-08-01 | Discharge: 2021-08-01 | Disposition: A | Discharge status: Home / Self Care | Payer: Medicaid Other | Attending: Emergency Medicine | Admitting: Emergency Medicine

## 2021-08-01 DIAGNOSIS — A563 Chlamydial infection of anus and rectum: Secondary | ICD-10-CM | POA: Diagnosis not present

## 2021-08-01 DIAGNOSIS — J45909 Unspecified asthma, uncomplicated: Secondary | ICD-10-CM | POA: Insufficient documentation

## 2021-08-01 DIAGNOSIS — K625 Hemorrhage of anus and rectum: Secondary | ICD-10-CM | POA: Diagnosis not present

## 2021-08-01 DIAGNOSIS — Z21 Asymptomatic human immunodeficiency virus [HIV] infection status: Secondary | ICD-10-CM | POA: Diagnosis not present

## 2021-08-01 LAB — COMPREHENSIVE METABOLIC PANEL
ALT: 17 U/L (ref 0–44)
AST: 28 U/L (ref 15–41)
Albumin: 3.5 g/dL (ref 3.5–5.0)
Alkaline Phosphatase: 56 U/L (ref 38–126)
Anion gap: 6 (ref 5–15)
BUN: 15 mg/dL (ref 6–20)
CO2: 26 mmol/L (ref 22–32)
Calcium: 9.2 mg/dL (ref 8.9–10.3)
Chloride: 99 mmol/L (ref 98–111)
Creatinine, Ser: 1.3 mg/dL — ABNORMAL HIGH (ref 0.61–1.24)
GFR, Estimated: >60 mL/min (ref >60)
Glucose, Bld: 107 mg/dL — ABNORMAL HIGH (ref 70–99)
Potassium: 3.6 mmol/L (ref 3.5–5.1)
Sodium: 131 mmol/L — ABNORMAL LOW (ref 135–145)
Total Bilirubin: 0.9 mg/dL (ref 0.3–1.2)
Total Protein: 9.4 g/dL — ABNORMAL HIGH (ref 6.5–8.1)

## 2021-08-01 LAB — CBC WITH DIFFERENTIAL/PLATELET
Abs Immature Granulocytes: 0.02 10*3/uL (ref 0.00–0.07)
Basophils Absolute: 0 10*3/uL (ref 0.0–0.1)
Basophils Relative: 0 %
Eosinophils Absolute: 0.2 10*3/uL (ref 0.0–0.5)
Eosinophils Relative: 2 %
HCT: 43.2 % (ref 39.0–52.0)
Hemoglobin: 14.2 g/dL (ref 13.0–17.0)
Immature Granulocytes: 0 %
Lymphocytes Relative: 24 %
Lymphs Abs: 2.2 10*3/uL (ref 0.7–4.0)
MCH: 30.5 pg (ref 26.0–34.0)
MCHC: 32.9 g/dL (ref 30.0–36.0)
MCV: 92.9 fL (ref 80.0–100.0)
Monocytes Absolute: 0.7 10*3/uL (ref 0.1–1.0)
Monocytes Relative: 8 %
Neutro Abs: 5.8 10*3/uL (ref 1.7–7.7)
Neutrophils Relative %: 66 %
Platelets: 383 10*3/uL (ref 150–400)
RBC: 4.65 MIL/uL (ref 4.22–5.81)
RDW: 14 % (ref 11.5–15.5)
WBC: 9 10*3/uL (ref 4.0–10.5)
nRBC: 0 % (ref 0.0–0.2)

## 2021-08-01 LAB — POC OCCULT BLOOD, ED: Fecal Occult Bld: POSITIVE — AB

## 2021-08-01 MED ORDER — DOXYCYCLINE HYCLATE 100 MG PO TABS: 100.0000 mg | ORAL_TABLET | Freq: Two times a day (BID) | ORAL | 0 refills | Status: AC

## 2021-08-01 MED ORDER — DOXYCYCLINE HYCLATE 100 MG PO TABS
100.0000 mg | ORAL_TABLET | Freq: Once | ORAL | Status: AC
  Administered 2021-08-01: 100 mg via ORAL
  Filled 2021-08-01: qty 1

## 2021-08-01 MED ORDER — DOXYCYCLINE HYCLATE 100 MG PO TABS
100.0000 mg | ORAL_TABLET | Freq: Two times a day (BID) | ORAL | 0 refills | Status: DC
  Filled 2021-08-01: qty 42, 21d supply, fill #0

## 2021-08-01 NOTE — ED Provider Notes (Signed)
[facility] EMERGENCY DEPARTMENT Provider Note   CSN: [id number] Arrival date & time: 08/01/21  1210     History Chief Complaint  Patient presents with   Rectal Bleeding    Kenneth Welch. is a 35 y.o. male.  Kenneth Welch. is a 35 y.o. male with a history of HIV, and chlamydia infection, who presents to the emergency department for evaluation of ongoing rectal bleeding.  This has been an issue since May when patient was initially seen in the emergency department for similar symptoms.  He reports he has continued to notice blood when wiping and blood in the toilet bowl whenever he has a bowel movement.  Patient does report that he intermittently has some rectal pain and discomfort.  Blood is not usually mixed in with the stool.  He has not had any abdominal pain, vomiting or hematemesis.  No fevers or chills.  He was seen at the infectious disease clinic on 8/9, and they discussed similar symptoms, at that time they collected swabs for STDs and monkey Parks testing.  Patient was negative for monkey box but his anal swab was positive for chlamydia.  This was suspected to be the cause of his rectal bleeding and discomfort and they called in a prescription for doxycycline for 21 days but patient reports he was unable to get transportation to the pharmacy to pick up this medication and has not started this treatment.  He reports he is taking his Biktarvy but sometimes forgets to take this medication.  Reports he was supposed to follow-up with [facility] this past Monday but missed his appointment again due to issues with transportation.  Patient reports he continues to be sexually active and receive anal sex, reports he does not have bleeding during sex but does sometimes have discomfort.  The history is provided by the patient and medical records.      Past Medical History:  Diagnosis Date   Acute HIV infection (HCC) 04/16/2021   Asthma    Chlamydia  07/13/2021   Gonorrhea 07/13/2021   HIV (human immunodeficiency virus infection) (HCC)    Monkeypox 07/13/2021   Pain associated with defecation 07/13/2021   Papules 07/13/2021   Rectal bleeding 04/16/2021   Schizophrenia [facility])     Patient Active Problem List   Diagnosis Date Noted   Pain associated with defecation 07/13/2021   Gonorrhea 07/13/2021   Papules 07/13/2021   Monkeypox 07/13/2021   Chlamydia 07/13/2021   Acute HIV infection (HCC) 04/16/2021   Rectal bleeding 04/16/2021   Schizoaffective disorder (HCC) 04/10/2012    History reviewed. No pertinent surgical history.     History reviewed. No pertinent family history.  Social History   Tobacco Use   Smoking status: Never   Smokeless tobacco: Never  Vaping Use   Vaping Use: Never used  Substance Use Topics   Alcohol use: Never   Drug use: Never    Home Medications Prior to Admission medications   Medication Sig Start Date End Date Taking? Authorizing Provider  acetaminophen (TYLENOL) 325 MG tablet Take 2 tablets (650 mg total) by mouth every 6 (six) hours as needed for mild pain or moderate pain. 07/20/20   Terald Sleeper, MD  ARIPiprazole (ABILIFY) 10 MG tablet Take 1 tablet (10 mg total) by mouth daily. 07/01/21   Arfeen, Phillips Grout, MD  benztropine (COGENTIN) 0.5 MG tablet Take 1 tablet (0.5 mg total) by mouth at bedtime. 07/01/21   Cleotis Nipper, MD  bictegravir-emtricitabine-tenofovir AF (BIKTARVY) 50-200-25 MG TABS tablet Take 1 tablet by mouth daily. 04/16/21   Randall Hiss, MD  bictegravir-emtricitabine-tenofovir AF (BIKTARVY) 50-200-25 MG TABS tablet Take 1 tablet by mouth daily 05/14/21     doxycycline (VIBRA-TABS) 100 MG tablet Take 1 tablet (100 mg total) by mouth 2 (two) times daily for 21 days. 08/01/21 08/22/21  Dartha Lodge, PA-C    Allergies    Patient has no known allergies.  Review of Systems   Review of Systems  Constitutional:  Negative for chills and fever.  Respiratory:  Negative for cough  and shortness of breath.   Cardiovascular:  Negative for chest pain.  Gastrointestinal:  Positive for blood in stool and rectal pain. Negative for abdominal pain, nausea and vomiting.  Skin:  Negative for pallor and rash.  Neurological:  Negative for syncope and light-headedness.  All other systems reviewed and are negative.  Physical Exam Updated Vital Signs BP 121/71 (BP Location: Right Arm)   Pulse 77   Temp 97.9 F (36.6 C) (Oral)   Resp 16   Ht 6\' 2"  (1.88 m)   Wt 70.3 kg   SpO2 98%   BMI 19.90 kg/m   Physical Exam Vitals and nursing note reviewed.  Constitutional:      General: He is not in acute distress.    Appearance: Normal appearance. He is well-developed and normal weight. He is not ill-appearing or diaphoretic.  HENT:     Head: Normocephalic and atraumatic.  Eyes:     General:        Right eye: No discharge.        Left eye: No discharge.  Cardiovascular:     Rate and Rhythm: Normal rate and regular rhythm.     Pulses: Normal pulses.     Heart sounds: Normal heart sounds. No murmur heard.   No friction rub. No gallop.  Pulmonary:     Effort: Pulmonary effort is normal. No respiratory distress.     Breath sounds: Normal breath sounds.  Abdominal:     General: Bowel sounds are normal. There is no distension.     Palpations: Abdomen is soft. There is no mass.     Tenderness: There is no abdominal tenderness. There is no guarding.  Genitourinary:    Comments: Chaperone present during rectal exam.  No external fissures noted, some very small raised papular lesions noted around the anus.  Internal exam with very small amount of dark blood, patient with discomfort throughout exam Skin:    General: Skin is warm and dry.  Neurological:     Mental Status: He is alert and oriented to person, place, and time.     Coordination: Coordination normal.  Psychiatric:        Mood and Affect: Mood normal.        Behavior: Behavior normal.    ED Results / Procedures /  Treatments   Labs (all labs ordered are listed, but only abnormal results are displayed) Labs Reviewed  COMPREHENSIVE METABOLIC PANEL - Abnormal; Notable for the following components:      Result Value   Sodium 131 (*)    Glucose, Bld 107 (*)    Creatinine, Ser 1.30 (*)    Total Protein 9.4 (*)    All other components within normal limits  POC OCCULT BLOOD, ED - Abnormal; Notable for the following components:   Fecal Occult Bld POSITIVE (*)    All other components within normal limits  CBC WITH  DIFFERENTIAL/PLATELET    EKG None  Radiology No results found.  Procedures Procedures   Medications Ordered in ED Medications  doxycycline (VIBRA-TABS) tablet 100 mg (100 mg Oral Given 08/01/21 1714)    ED Course  I have reviewed the triage vital signs and the nursing notes.  Pertinent labs & imaging results that were available during my care of the patient were reviewed by me and considered in my medical decision making (see chart for details).    MDM Rules/Calculators/A&P                           35 year old male presents with continued rectal bleeding, this has been an ongoing issue for the past 3 months.  Despite reported bleeding patient with hemoglobin of 14.2 which is very reassuring.  Scant amount of dark red blood noted on exam without fissures, patient with significant discomfort throughout exam.  At recent infectious disease appointment patient tested positive for chlamydia via anal swab, was prescribed doxycycline 21-day course but patient has not picked up or started this medication.  Suspect this is the source of his ongoing bleeding, he has no associated abdominal pain.  He is hemodynamically stable and well-appearing and not having lightheadedness or syncope.  Patient's primary barrier to infectious disease follow-up and getting prescription seems to be lack of transportation.  He lives within walking distance to [facility], after discussing with transitions of  care team prescription sent to [facility] outpatient pharmacy that patient can walk to pick up tomorrow, he was given dose of doxycycline here in the emergency department.  I have also given him a paper copy of this prescription that he can take to any pharmacy.  I have given him the phone number for emergency department case management that he can call tomorrow to try and get assistance with transportation for his next infectious disease appointment to make sure he is able to go.  Patient expresses understanding and agreement with this plan.  Discharged home in good condition.   Final Clinical Impression(s) / ED Diagnoses Final diagnoses:  Rectal bleeding  Chlamydial proctitis    Rx / DC Orders ED Discharge Orders          Ordered    doxycycline (VIBRA-TABS) 100 MG tablet  2 times daily,   Status:  Discontinued        08/01/21 1803    doxycycline (VIBRA-TABS) 100 MG tablet  2 times daily        08/01/21 1804             Legrand Rams 08/01/21 Angela Cox, MD 08/01/21 2314

## 2021-08-01 NOTE — ED Triage Notes (Signed)
Patient states that he is currently being treated for HIV, however, he has not been able to make it to all of his appointments and he has not been taking his medications as directed. Per the patient, this has caused him to have worsening rectal bleeding (ongoing issue for 3 months). Patient reports fatigue.

## 2021-08-01 NOTE — Discharge Instructions (Signed)
Your ongoing rectal bleeding and discomfort is due to chlamydia infection.  It is extremely important that you pick up your prescription for doxycycline and begin taking this twice daily for 21 days.  It is very important that you complete all of your antibiotics even if your symptoms resolve.  I have sent this prescription to the [facility] outpatient pharmacy which you can walk to tomorrow to pick this up I have also provided you a print out of this prescription that you can take to any pharmacy.  You can call the number provided on your paperwork tomorrow between the hours of 8 AM to 5 PM to discuss with our ED case manager and she may be able to help you with ensuring transportation to your next infectious disease appointment.

## 2021-08-01 NOTE — ED Provider Notes (Signed)
Emergency Medicine Provider Triage Evaluation Note  Kenneth Welch , a 35 y.o. male  was evaluated in triage.  Pt complains of rectal bleeding.  Patient reports recently diagnosed HIV, occasionally misses doses of medication.  Patient reports rectal bleeding ongoing for the past 3 months no improvement of symptoms.  Additionally feels somewhat more tired than normal.  Review of Systems  Positive: Rectal bleeding, fatigue Negative: Fall/injury, fever/chills, nausea/vomiting, diarrhea, dysuria/hematuria or any additional concerns  Physical Exam  BP 131/90 (BP Location: Right Arm)   Pulse (!) 115   Temp 97.9 F (36.6 C) (Oral)   Resp 16   Ht 6\' 2"  (1.88 m)   Wt 70.3 kg   SpO2 99%   BMI 19.90 kg/m  Gen:   Awake, no distress   Resp:  Normal effort  MSK:   Moves extremities without difficulty  Other:  Alert and oriented  Medical Decision Making  Medically screening exam initiated at 1:08 PM.  Appropriate orders placed.  . was informed that the remainder of the evaluation will be completed by another provider, this initial triage assessment does not replace that evaluation, and the importance of remaining in the ED until their evaluation is complete.    Note: Portions of this report may have been transcribed using voice recognition software. Every effort was made to ensure accuracy; however, inadvertent computerized transcription errors may still be present.    Kenneth Becton, PA-C 08/01/21 1309    08/03/21, MD 08/01/21 [phone]

## 2021-08-01 NOTE — Care Management (Addendum)
Patient in for rectal bleeding had previous rectal chlamydia treated with antibiotics in May. Positive recently for HIV. Patient is not  eligible for medication assistance due to the patient has insurance , Medicaid. Marland Kitchen He has a PCP and should make a follow up appointment. Discussed with provider, patient has missed appointments and never picked up previous antibiotics. This due primarily to transportation issues. She will tell pateint to call ED CM in AM to see if CM can set up transportation for Monday PCP appointment. Medication will be called to [facility] where he can ambulate to pick up.

## 2021-08-02 ENCOUNTER — Other Ambulatory Visit (HOSPITAL_COMMUNITY): Payer: Self-pay

## 2021-08-03 ENCOUNTER — Other Ambulatory Visit (HOSPITAL_COMMUNITY): Payer: Self-pay

## 2021-08-16 ENCOUNTER — Other Ambulatory Visit (HOSPITAL_COMMUNITY): Payer: Self-pay

## 2021-08-23 ENCOUNTER — Other Ambulatory Visit (HOSPITAL_COMMUNITY): Payer: Self-pay

## 2021-08-24 ENCOUNTER — Other Ambulatory Visit (HOSPITAL_COMMUNITY): Payer: Self-pay

## 2021-09-12 ENCOUNTER — Emergency Department (HOSPITAL_COMMUNITY)
Admission: EM | Admit: 2021-09-12 | Discharge: 2021-09-12 | Disposition: A | Discharge status: Home / Self Care | Payer: Medicaid Other | Attending: Emergency Medicine | Admitting: Emergency Medicine

## 2021-09-12 ENCOUNTER — Encounter (HOSPITAL_COMMUNITY): Payer: Self-pay

## 2021-09-12 ENCOUNTER — Other Ambulatory Visit: Payer: Self-pay

## 2021-09-12 DIAGNOSIS — J029 Acute pharyngitis, unspecified: Secondary | ICD-10-CM | POA: Insufficient documentation

## 2021-09-12 DIAGNOSIS — Z5321 Procedure and treatment not carried out due to patient leaving prior to being seen by health care provider: Secondary | ICD-10-CM | POA: Insufficient documentation

## 2021-09-12 DIAGNOSIS — H9202 Otalgia, left ear: Secondary | ICD-10-CM | POA: Diagnosis not present

## 2021-09-12 NOTE — ED Notes (Signed)
Pt turned in his labels to the screener and told them he had decided to leave

## 2021-09-12 NOTE — ED Triage Notes (Signed)
Pt presents with c/o left ear pain and sore throat for 2 days.

## 2021-09-13 ENCOUNTER — Emergency Department (HOSPITAL_COMMUNITY)
Admission: EM | Admit: 2021-09-13 | Discharge: 2021-09-13 | Disposition: A | Discharge status: Home / Self Care | Payer: Medicaid Other | Attending: Emergency Medicine | Admitting: Emergency Medicine

## 2021-09-13 ENCOUNTER — Encounter (HOSPITAL_COMMUNITY): Payer: Self-pay | Admitting: Emergency Medicine

## 2021-09-13 DIAGNOSIS — Z21 Asymptomatic human immunodeficiency virus [HIV] infection status: Secondary | ICD-10-CM | POA: Diagnosis not present

## 2021-09-13 DIAGNOSIS — H9202 Otalgia, left ear: Secondary | ICD-10-CM | POA: Insufficient documentation

## 2021-09-13 DIAGNOSIS — R590 Localized enlarged lymph nodes: Secondary | ICD-10-CM | POA: Diagnosis not present

## 2021-09-13 DIAGNOSIS — J45909 Unspecified asthma, uncomplicated: Secondary | ICD-10-CM | POA: Diagnosis not present

## 2021-09-13 DIAGNOSIS — J029 Acute pharyngitis, unspecified: Secondary | ICD-10-CM | POA: Insufficient documentation

## 2021-09-13 DIAGNOSIS — J020 Streptococcal pharyngitis: Secondary | ICD-10-CM

## 2021-09-13 LAB — GROUP A STREP BY PCR: Group A Strep by PCR: DETECTED — AB

## 2021-09-13 MED ORDER — PENICILLIN G BENZATHINE 1200000 UNIT/2ML IM SUSY
1.2000 10*6.[IU] | PREFILLED_SYRINGE | Freq: Once | INTRAMUSCULAR | Status: AC
  Administered 2021-09-13: 1.2 10*6.[IU] via INTRAMUSCULAR
  Filled 2021-09-13: qty 2

## 2021-09-13 NOTE — ED Triage Notes (Signed)
Per pt, states he is having left ear and throat pain-symptoms for 3 days

## 2021-09-13 NOTE — ED Provider Notes (Signed)
[facility]-EMERGENCY DEPT Provider Note   CSN: [id number] Arrival date & time: 09/13/21  1027     History Chief Complaint  Patient presents with   Sore Throat   Otalgia    Kenneth Welch. is a 35 y.o. male.  35 year old male presents with 3 days of left ear pain along with left-sided throat discomfort.  No fever or chills.  No trouble swallowing.  Pain is characterizes soreness.  Is concerned that he might have strep throat.      Past Medical History:  Diagnosis Date   Acute HIV infection (HCC) 04/16/2021   Asthma    Chlamydia 07/13/2021   Gonorrhea 07/13/2021   HIV (human immunodeficiency virus infection) (HCC)    Monkeypox 07/13/2021   Pain associated with defecation 07/13/2021   Papules 07/13/2021   Rectal bleeding 04/16/2021   Schizophrenia [facility])     Patient Active Problem List   Diagnosis Date Noted   Pain associated with defecation 07/13/2021   Gonorrhea 07/13/2021   Papules 07/13/2021   Monkeypox 07/13/2021   Chlamydia 07/13/2021   Acute HIV infection (HCC) 04/16/2021   Rectal bleeding 04/16/2021   Schizoaffective disorder (HCC) 04/10/2012    History reviewed. No pertinent surgical history.     No family history on file.  Social History   Tobacco Use   Smoking status: Never   Smokeless tobacco: Never  Vaping Use   Vaping Use: Never used  Substance Use Topics   Alcohol use: Never   Drug use: Never    Home Medications Prior to Admission medications   Medication Sig Start Date End Date Taking? Authorizing Provider  acetaminophen (TYLENOL) 325 MG tablet Take 2 tablets (650 mg total) by mouth every 6 (six) hours as needed for mild pain or moderate pain. 07/20/20   Terald Sleeper, MD  ARIPiprazole (ABILIFY) 10 MG tablet Take 1 tablet (10 mg total) by mouth daily. 07/01/21   Arfeen, Phillips Grout, MD  benztropine (COGENTIN) 0.5 MG tablet Take 1 tablet (0.5 mg total) by mouth at bedtime. 07/01/21   Arfeen, Phillips Grout, MD   bictegravir-emtricitabine-tenofovir AF (BIKTARVY) 50-200-25 MG TABS tablet Take 1 tablet by mouth daily. 04/16/21   Randall Hiss, MD  bictegravir-emtricitabine-tenofovir AF (BIKTARVY) 50-200-25 MG TABS tablet Take 1 tablet by mouth daily 05/14/21       Allergies    Patient has no known allergies.  Review of Systems   Review of Systems  All other systems reviewed and are negative.  Physical Exam Updated Vital Signs BP 128/69 (BP Location: Right Arm)   Pulse 78   Temp 98.2 F (36.8 C) (Oral)   Resp 16   SpO2 99%   Physical Exam Vitals and nursing note reviewed.  Constitutional:      General: He is not in acute distress.    Appearance: Normal appearance. He is well-developed. He is not toxic-appearing.  HENT:     Head: Normocephalic and atraumatic.     Mouth/Throat:     Pharynx: Oropharyngeal exudate and posterior oropharyngeal erythema present.  Eyes:     General: Lids are normal.     Conjunctiva/sclera: Conjunctivae normal.     Pupils: Pupils are equal, round, and reactive to light.  Neck:     Thyroid: No thyroid mass.     Trachea: No tracheal deviation.  Cardiovascular:     Rate and Rhythm: Normal rate and regular rhythm.     Heart sounds: Normal heart sounds. No murmur heard.  No gallop.  Pulmonary:     Effort: Pulmonary effort is normal. No respiratory distress.     Breath sounds: Normal breath sounds. No stridor. No decreased breath sounds, wheezing, rhonchi or rales.  Abdominal:     General: There is no distension.     Palpations: Abdomen is soft.     Tenderness: There is no abdominal tenderness. There is no rebound.  Musculoskeletal:        General: No tenderness. Normal range of motion.     Cervical back: Normal range of motion and neck supple.  Lymphadenopathy:     Cervical: Cervical adenopathy present.     Left cervical: Superficial cervical adenopathy present.  Skin:    General: Skin is warm and dry.     Findings: No abrasion or rash.   Neurological:     Mental Status: He is alert and oriented to person, place, and time. Mental status is at baseline.     GCS: GCS eye subscore is 4. GCS verbal subscore is 5. GCS motor subscore is 6.     Cranial Nerves: Cranial nerves are intact. No cranial nerve deficit.     Sensory: No sensory deficit.     Motor: Motor function is intact.  Psychiatric:        Attention and Perception: Attention normal.        Speech: Speech normal.        Behavior: Behavior normal.    ED Results / Procedures / Treatments   Labs (all labs ordered are listed, but only abnormal results are displayed) Labs Reviewed  GROUP A STREP BY PCR    EKG None  Radiology No results found.  Procedures Procedures   Medications Ordered in ED Medications - No data to display  ED Course  I have reviewed the triage vital signs and the nursing notes.  Pertinent labs & imaging results that were available during my care of the patient were reviewed by me and considered in my medical decision making (see chart for details).    MDM Rules/Calculators/A&P                           Patient treated for strep here with Bicillin after rapid strep was positive.  Will discharge home Final Clinical Impression(s) / ED Diagnoses Final diagnoses:  None    Rx / DC Orders ED Discharge Orders     None        Lorre Nick, MD 09/13/21 1044

## 2021-09-17 ENCOUNTER — Other Ambulatory Visit (HOSPITAL_COMMUNITY): Payer: Self-pay

## 2021-09-30 ENCOUNTER — Other Ambulatory Visit (HOSPITAL_COMMUNITY): Payer: Self-pay

## 2021-10-05 ENCOUNTER — Other Ambulatory Visit (HOSPITAL_COMMUNITY): Payer: Self-pay | Admitting: Student

## 2021-10-05 ENCOUNTER — Other Ambulatory Visit (HOSPITAL_COMMUNITY): Payer: Self-pay

## 2021-10-06 ENCOUNTER — Other Ambulatory Visit (HOSPITAL_COMMUNITY): Payer: Self-pay

## 2021-10-06 NOTE — Telephone Encounter (Signed)
Cannot refill ED prescriptions, needs to follow up as an outpatient

## 2021-10-07 ENCOUNTER — Other Ambulatory Visit: Payer: Self-pay

## 2021-10-07 ENCOUNTER — Telehealth (HOSPITAL_COMMUNITY): Payer: Medicaid Other | Admitting: Psychiatry

## 2021-10-07 ENCOUNTER — Other Ambulatory Visit (HOSPITAL_COMMUNITY): Payer: Self-pay

## 2021-10-11 ENCOUNTER — Other Ambulatory Visit (HOSPITAL_COMMUNITY): Payer: Self-pay

## 2021-10-13 ENCOUNTER — Other Ambulatory Visit (HOSPITAL_COMMUNITY): Payer: Self-pay

## 2021-10-13 ENCOUNTER — Telehealth: Payer: Self-pay

## 2021-10-13 NOTE — Telephone Encounter (Signed)
Called patient to get follow up with Dr. Daiva Eves scheduled for this month, left voicemail for patient to call us and get scheduled

## 2021-10-22 ENCOUNTER — Other Ambulatory Visit: Payer: Self-pay | Admitting: Infectious Disease

## 2021-10-22 ENCOUNTER — Other Ambulatory Visit (HOSPITAL_COMMUNITY): Payer: Self-pay

## 2021-11-05 ENCOUNTER — Other Ambulatory Visit (HOSPITAL_COMMUNITY): Payer: Self-pay

## 2021-11-08 ENCOUNTER — Other Ambulatory Visit (HOSPITAL_COMMUNITY): Payer: Self-pay

## 2021-11-15 ENCOUNTER — Ambulatory Visit: Payer: Self-pay | Admitting: Infectious Disease

## 2021-11-15 ENCOUNTER — Other Ambulatory Visit (HOSPITAL_COMMUNITY): Payer: Self-pay

## 2021-12-07 ENCOUNTER — Ambulatory Visit: Payer: Medicaid Other | Admitting: Family

## 2021-12-10 ENCOUNTER — Other Ambulatory Visit (HOSPITAL_COMMUNITY): Payer: Self-pay

## 2021-12-16 ENCOUNTER — Other Ambulatory Visit (HOSPITAL_COMMUNITY): Payer: Self-pay

## 2022-01-10 ENCOUNTER — Other Ambulatory Visit (HOSPITAL_COMMUNITY): Payer: Self-pay

## 2022-01-13 ENCOUNTER — Other Ambulatory Visit (HOSPITAL_COMMUNITY): Payer: Self-pay

## 2022-01-21 ENCOUNTER — Other Ambulatory Visit (HOSPITAL_COMMUNITY): Payer: Self-pay

## 2022-02-03 ENCOUNTER — Ambulatory Visit: Payer: Medicaid Other | Admitting: Infectious Disease

## 2022-02-04 ENCOUNTER — Other Ambulatory Visit: Payer: Self-pay

## 2022-02-04 ENCOUNTER — Other Ambulatory Visit (HOSPITAL_COMMUNITY): Payer: Self-pay

## 2022-02-04 ENCOUNTER — Encounter: Payer: Self-pay | Admitting: Infectious Disease

## 2022-02-04 ENCOUNTER — Ambulatory Visit (INDEPENDENT_AMBULATORY_CARE_PROVIDER_SITE_OTHER): Payer: Medicaid Other | Admitting: Infectious Disease

## 2022-02-04 VITALS — BP 129/83 | HR 90 | Temp 98.7°F | Ht 75.0 in | Wt 181.0 lb

## 2022-02-04 DIAGNOSIS — Z1212 Encounter for screening for malignant neoplasm of rectum: Secondary | ICD-10-CM | POA: Diagnosis not present

## 2022-02-04 DIAGNOSIS — A549 Gonococcal infection, unspecified: Secondary | ICD-10-CM

## 2022-02-04 DIAGNOSIS — K625 Hemorrhage of anus and rectum: Secondary | ICD-10-CM | POA: Diagnosis not present

## 2022-02-04 DIAGNOSIS — B20 Human immunodeficiency virus [HIV] disease: Secondary | ICD-10-CM | POA: Insufficient documentation

## 2022-02-04 DIAGNOSIS — Z23 Encounter for immunization: Secondary | ICD-10-CM | POA: Diagnosis not present

## 2022-02-04 DIAGNOSIS — A749 Chlamydial infection, unspecified: Secondary | ICD-10-CM | POA: Diagnosis not present

## 2022-02-04 DIAGNOSIS — R198 Other specified symptoms and signs involving the digestive system and abdomen: Secondary | ICD-10-CM

## 2022-02-04 DIAGNOSIS — A630 Anogenital (venereal) warts: Secondary | ICD-10-CM | POA: Diagnosis not present

## 2022-02-04 HISTORY — DX: Human immunodeficiency virus (HIV) disease: B20

## 2022-02-04 MED ORDER — DOXYCYCLINE HYCLATE 100 MG PO TABS
ORAL_TABLET | ORAL | 5 refills | Status: DC
  Filled 2022-02-04: qty 60, 30d supply, fill #0

## 2022-02-04 MED ORDER — BIKTARVY 50-200-25 MG PO TABS
1.0000 | ORAL_TABLET | Freq: Every day | ORAL | 11 refills | Status: DC
  Filled 2022-02-04: qty 30, 30d supply, fill #0

## 2022-02-04 NOTE — Progress Notes (Signed)
? ?Subjective:  ? ?Chief complaint: Rectal pain and bleeding with defecation again ? ? Patient ID: Kenneth Becton., male    DOB: 1986/03/26, 36 y.o.   MRN: [id number] ? ? ?HPI  ? ?Kenneth Welch is a 36 year old Black man diagnosed t last Spring with acute HIV. He had been seen in ER and treated presumptively for [facility] when HIV test came back positive. At that time he was also having rectal bleeding.  ? ?When I saw him in May we started [facility] and he was again + for [facility] and chlamydia and we were able to bring him back for IM ceftriaxone and he was rx doxycline.  ? ?When I last saw him he had lesions on his rectum that we worried might be monkeypox though his PCR turned back negative.  He did have gonorrhea and chlamydia again that required treatment. ? ?His viral load is come under control. ? ?He is again complaining of blood with defecation. ? ?He did see in the ER where he was guaiac positive. ? ? ? ? ? ? ?Past Medical History:  ?Diagnosis Date  ? Acute HIV infection (HCC) 04/16/2021  ? Asthma   ? Chlamydia 07/13/2021  ? Gonorrhea 07/13/2021  ? HIV (human immunodeficiency virus infection) (HCC)   ? Monkeypox 07/13/2021  ? Pain associated with defecation 07/13/2021  ? Papules 07/13/2021  ? Rectal bleeding 04/16/2021  ? Schizophrenia (HCC)   ? ? ?No past surgical history on file. ? ?No family history on file. ? ?  ?Social History  ? ?Socioeconomic History  ? Marital status: Single  ?  Spouse name: Not on file  ? Number of children: Not on file  ? Years of education: Not on file  ? Highest education level: Not on file  ?Occupational History  ? Not on file  ?Tobacco Use  ? Smoking status: Never  ? Smokeless tobacco: Never  ?Vaping Use  ? Vaping Use: Never used  ?Substance and Sexual Activity  ? Alcohol use: Never  ? Drug use: Never  ? Sexual activity: Not Currently  ?  Comment: accepted condoms  ?Other Topics Concern  ? Not on file  ?Social History Narrative  ? ** Merged History Encounter **  ?    ? ?Social Determinants of  Health  ? ?Financial Resource Strain: Not on file  ?Food Insecurity: Not on file  ?Transportation Needs: Not on file  ?Physical Activity: Not on file  ?Stress: Not on file  ?Social Connections: Not on file  ? ? ?No Known Allergies ? ? ?Current Outpatient Medications:  ?  ARIPiprazole (ABILIFY) 10 MG tablet, Take 1 tablet (10 mg total) by mouth daily., Disp: 90 tablet, Rfl: 0 ?  benztropine (COGENTIN) 0.5 MG tablet, Take 1 tablet (0.5 mg total) by mouth at bedtime., Disp: 90 tablet, Rfl: 0 ?  bictegravir-emtricitabine-tenofovir AF (BIKTARVY) 50-200-25 MG TABS tablet, Take 1 tablet by mouth daily., Disp: 30 tablet, Rfl: 11 ?  acetaminophen (TYLENOL) 325 MG tablet, Take 2 tablets (650 mg total) by mouth every 6 (six) hours as needed for mild pain or moderate pain. (Patient not taking: Reported on 02/04/2022), Disp: 30 tablet, Rfl: 0 ?  bictegravir-emtricitabine-tenofovir AF (BIKTARVY) 50-200-25 MG TABS tablet, Take 1 tablet by mouth daily, Disp: 30 tablet, Rfl: 0 ? ? ?Review of Systems  ?Constitutional:  Negative for activity change, appetite change, chills, diaphoresis, fatigue, fever and unexpected weight change.  ?HENT:  Negative for congestion, rhinorrhea, sinus pressure, sneezing, sore throat and trouble  swallowing.   ?Eyes:  Negative for photophobia and visual disturbance.  ?Respiratory:  Negative for cough, chest tightness, shortness of breath, wheezing and stridor.   ?Cardiovascular:  Negative for chest pain, palpitations and leg swelling.  ?Gastrointestinal:  Positive for anal bleeding. Negative for abdominal distention, abdominal pain, blood in stool, constipation, diarrhea, nausea and vomiting.  ?Genitourinary:  Negative for difficulty urinating, dysuria, flank pain and hematuria.  ?Musculoskeletal:  Negative for arthralgias, back pain, gait problem, joint swelling and myalgias.  ?Skin:  Negative for color change, pallor, rash and wound.  ?Neurological:  Negative for dizziness, tremors, weakness and  light-headedness.  ?Hematological:  Negative for adenopathy. Does not bruise/bleed easily.  ?Psychiatric/Behavioral:  Negative for agitation, behavioral problems, confusion, decreased concentration, dysphoric mood and sleep disturbance.   ? ?   ?Objective:  ? Physical Exam ?Exam conducted with a chaperone present.  ?Constitutional:   ?   Appearance: He is well-developed.  ?HENT:  ?   Head: Normocephalic and atraumatic.  ?Eyes:  ?   Conjunctiva/sclera: Conjunctivae normal.  ?Cardiovascular:  ?   Rate and Rhythm: Normal rate and regular rhythm.  ?Pulmonary:  ?   Effort: Pulmonary effort is normal. No respiratory distress.  ?   Breath sounds: No wheezing.  ?Abdominal:  ?   General: There is no distension.  ?   Palpations: Abdomen is soft.  ?Genitourinary: ?   Prostate: Normal.  ?   Rectum: Tenderness and external hemorrhoid present. No mass.  ?Musculoskeletal:     ?   General: No tenderness. Normal range of motion.  ?   Cervical back: Normal range of motion and neck supple.  ?Skin: ?   General: Skin is warm and dry.  ?   Coloration: Skin is not pale.  ?   Findings: No erythema or rash.  ?Neurological:  ?   General: No focal deficit present.  ?   Mental Status: He is alert and oriented to person, place, and time.  ?Psychiatric:     ?   Mood and Affect: Mood normal.     ?   Behavior: Behavior normal.     ?   Thought Content: Thought content normal.     ?   Judgment: Judgment normal.  ? ? ? ? ? ? ? ? ?   ?Assessment & Plan:  ?Rectal bleeding: He seems to have several external hemorrhoids may have some HPV lesions though these are not as discrete to me. ? ?I am referring him to general surgery for evaluation of his rectal bleeding he also benefits from high resolution anoscopy to screen for rectal cancer. ? ?HIV disease: ? ?We are I am checking a viral load CD4 count CBC with differential CMP ? ?I am continuing his Biktarvy ? ? ?Marland KitchenHistory of gonorrhea and chlamydia: We will recheck gonorrhea chlamydia and urine and also it  oropharynx and rectum. ? ?I have also given a prescription for doxycycline to take 2 tablets after sex and we gave him a dose of Menveo in the hopes this will help prevent gonorrheal infection. ? ? ? ?

## 2022-02-05 ENCOUNTER — Other Ambulatory Visit (HOSPITAL_COMMUNITY): Payer: Self-pay

## 2022-02-07 ENCOUNTER — Telehealth: Payer: Self-pay

## 2022-02-07 LAB — CBC WITH DIFFERENTIAL/PLATELET
Absolute Monocytes: 423 cells/uL (ref 200–950)
Basophils Absolute: 33 cells/uL (ref 0–200)
Basophils Relative: 0.5 %
Eosinophils Absolute: 221 cells/uL (ref 15–500)
Eosinophils Relative: 3.4 %
HCT: 34.6 % — ABNORMAL LOW (ref 38.5–50.0)
Hemoglobin: 11.5 g/dL — ABNORMAL LOW (ref 13.2–17.1)
Lymphs Abs: 2548 cells/uL (ref 850–3900)
MCH: 30.1 pg (ref 27.0–33.0)
MCHC: 33.2 g/dL (ref 32.0–36.0)
MCV: 90.6 fL (ref 80.0–100.0)
MPV: 9.8 fL (ref 7.5–12.5)
Monocytes Relative: 6.5 %
Neutro Abs: 3276 cells/uL (ref 1500–7800)
Neutrophils Relative %: 50.4 %
Platelets: 354 10*3/uL (ref 140–400)
RBC: 3.82 10*6/uL — ABNORMAL LOW (ref 4.20–5.80)
RDW: 13.7 % (ref 11.0–15.0)
Total Lymphocyte: 39.2 %
WBC: 6.5 10*3/uL (ref 3.8–10.8)

## 2022-02-07 LAB — GC/CHLAMYDIA PROBE, AMP (THROAT)
Chlamydia trachomatis RNA: NOT DETECTED
Neisseria gonorrhoeae RNA: NOT DETECTED

## 2022-02-07 LAB — COMPLETE METABOLIC PANEL WITH GFR
AG Ratio: 0.8 (calc) — ABNORMAL LOW (ref 1.0–2.5)
ALT: 10 U/L (ref 9–46)
AST: 15 U/L (ref 10–40)
Albumin: 3.6 g/dL (ref 3.6–5.1)
Alkaline phosphatase (APISO): 60 U/L (ref 36–130)
BUN: 18 mg/dL (ref 7–25)
CO2: 27 mmol/L (ref 20–32)
Calcium: 8.7 mg/dL (ref 8.6–10.3)
Chloride: 102 mmol/L (ref 98–110)
Creat: 0.91 mg/dL (ref 0.60–1.26)
Globulin: 4.5 g/dL (calc) — ABNORMAL HIGH (ref 1.9–3.7)
Glucose, Bld: 91 mg/dL (ref 65–99)
Potassium: 4.2 mmol/L (ref 3.5–5.3)
Sodium: 136 mmol/L (ref 135–146)
Total Bilirubin: 0.2 mg/dL (ref 0.2–1.2)
Total Protein: 8.1 g/dL (ref 6.1–8.1)
eGFR: 113 mL/min/{1.73_m2} (ref >=60)

## 2022-02-07 LAB — T-HELPER CELLS (CD4) COUNT (NOT AT ARMC)
Absolute CD4: 855 cells/uL (ref 490–1740)
CD4 T Helper %: 33 % (ref 30–61)
Total lymphocyte count: 2615 cells/uL (ref 850–3900)

## 2022-02-07 LAB — CT/NG RNA, TMA RECTAL
Chlamydia Trachomatis RNA: DETECTED — AB
Neisseria Gonorrhoeae RNA: DETECTED — AB

## 2022-02-07 LAB — C. TRACHOMATIS/N. GONORRHOEAE RNA
C. trachomatis RNA, TMA: NOT DETECTED
N. gonorrhoeae RNA, TMA: NOT DETECTED

## 2022-02-07 LAB — HIV-1 RNA QUANT-NO REFLEX-BLD
HIV 1 RNA Quant: NOT DETECTED Copies/mL
HIV-1 RNA Quant, Log: NOT DETECTED Log cps/mL

## 2022-02-07 LAB — RPR: RPR Ser Ql: NONREACTIVE

## 2022-02-07 NOTE — Telephone Encounter (Signed)
-----   Message from Randall Hiss, MD sent at 02/07/2022 11:31 AM EST ----- ?Regarding: FW: ?[name] needs 500 mg IM rocephin and then doxy 100 mg bid x 21 d. Partners need to be tested and treated which can be at [facility]. They could also be considered for prep study Vs prep from clinic ?----- Message ----- ?From: Interface, [facility] Lab Results In ?Sent: 02/04/2022  10:58 PM EST ?To: Randall Hiss, MD ? ?

## 2022-02-07 NOTE — Telephone Encounter (Signed)
Called patient to relay results, no answer. Left HIPAA compliant voicemail requesting callback.   Kelcey Wickstrom D Reeva Davern, RN  

## 2022-02-09 ENCOUNTER — Other Ambulatory Visit (HOSPITAL_COMMUNITY): Payer: Self-pay

## 2022-02-09 ENCOUNTER — Other Ambulatory Visit: Payer: Self-pay

## 2022-02-09 MED ORDER — DOXYCYCLINE HYCLATE 100 MG PO TABS
ORAL_TABLET | ORAL | 5 refills | Status: DC
  Filled 2022-02-09: qty 60, fill #0
  Filled 2022-05-16 (×2): qty 60, 30d supply, fill #0

## 2022-02-09 NOTE — Telephone Encounter (Signed)
I spoke to the patient advised him of positive STI results. Patient request doxy be resent to [facility] and this has been sent. Patient scheduled for 02/10/22 for Rocephin. Patient advised to let partners know, so they can be tested and treated. Patient advised as well to abstain from sex until treatment and an additional 7-10 days after treatment.  ?Dawnelle Warman T Eulalia Ellerman ? ?

## 2022-02-10 ENCOUNTER — Ambulatory Visit: Payer: Medicaid Other

## 2022-02-11 ENCOUNTER — Ambulatory Visit: Payer: Medicaid Other

## 2022-02-23 ENCOUNTER — Other Ambulatory Visit (HOSPITAL_COMMUNITY): Payer: Self-pay

## 2022-02-23 ENCOUNTER — Other Ambulatory Visit: Payer: Self-pay

## 2022-02-23 DIAGNOSIS — B20 Human immunodeficiency virus [HIV] disease: Secondary | ICD-10-CM

## 2022-02-23 MED ORDER — BIKTARVY 50-200-25 MG PO TABS
1.0000 | ORAL_TABLET | Freq: Every day | ORAL | 11 refills | Status: DC
  Filled 2022-02-23: qty 30, 30d supply, fill #0

## 2022-02-23 MED ORDER — BIKTARVY 50-200-25 MG PO TABS
1.0000 | ORAL_TABLET | Freq: Every day | ORAL | 11 refills | Status: DC
  Filled 2022-02-23 (×2): qty 30, 30d supply, fill #0

## 2022-02-23 MED ORDER — BIKTARVY 50-200-25 MG PO TABS
1.0000 | ORAL_TABLET | Freq: Every day | ORAL | 11 refills | Status: DC
  Filled 2022-02-23 – 2022-02-24 (×2): qty 30, 30d supply, fill #0
  Filled 2022-04-01: qty 30, 30d supply, fill #1
  Filled 2022-04-26 – 2022-05-16 (×3): qty 30, 30d supply, fill #2

## 2022-02-24 ENCOUNTER — Other Ambulatory Visit (HOSPITAL_COMMUNITY): Payer: Self-pay

## 2022-02-25 ENCOUNTER — Telehealth: Payer: Self-pay

## 2022-02-25 NOTE — Telephone Encounter (Signed)
[facility] Patient Advocate Encounter ? ?Patient's medications have been couriered to [facility] from [facility] and will be picked up 02/28/22. ? ?Clearance Coots , CPhT ?Specialty Pharmacy Patient Advocate ?[facility] ?Phone: [phone] ?Fax:  [phone]  ?

## 2022-03-07 ENCOUNTER — Ambulatory Visit: Payer: Medicaid Other | Admitting: Infectious Disease

## 2022-03-15 ENCOUNTER — Other Ambulatory Visit (HOSPITAL_COMMUNITY): Payer: Self-pay

## 2022-03-21 ENCOUNTER — Other Ambulatory Visit (HOSPITAL_COMMUNITY): Payer: Self-pay

## 2022-03-23 ENCOUNTER — Other Ambulatory Visit (HOSPITAL_COMMUNITY): Payer: Self-pay

## 2022-03-28 ENCOUNTER — Other Ambulatory Visit (HOSPITAL_COMMUNITY): Payer: Self-pay

## 2022-04-01 ENCOUNTER — Other Ambulatory Visit (HOSPITAL_COMMUNITY): Payer: Self-pay

## 2022-04-05 ENCOUNTER — Telehealth: Payer: Self-pay

## 2022-04-05 NOTE — Telephone Encounter (Signed)
.  [facility] Patient Advocate Encounter ? ?Patient's medications have been couriered to [facility] from [facility] and will be picked up 04/06/22. ? ?Clearance Coots , CPhT ?Specialty Pharmacy Patient Advocate ?[facility] ?Phone: [phone] ?Fax:  [phone]  ?

## 2022-04-26 ENCOUNTER — Other Ambulatory Visit (HOSPITAL_COMMUNITY): Payer: Self-pay

## 2022-05-10 ENCOUNTER — Other Ambulatory Visit (HOSPITAL_COMMUNITY): Payer: Self-pay

## 2022-05-15 ENCOUNTER — Emergency Department (HOSPITAL_COMMUNITY)
Admission: EM | Admit: 2022-05-15 | Discharge: 2022-05-16 | Discharge status: Against Medical Advice | Payer: Medicaid Other | Attending: Emergency Medicine | Admitting: Emergency Medicine

## 2022-05-15 ENCOUNTER — Encounter (HOSPITAL_COMMUNITY): Payer: Self-pay | Admitting: Emergency Medicine

## 2022-05-15 ENCOUNTER — Other Ambulatory Visit: Payer: Self-pay

## 2022-05-15 DIAGNOSIS — R1033 Periumbilical pain: Secondary | ICD-10-CM | POA: Diagnosis present

## 2022-05-15 DIAGNOSIS — Z5321 Procedure and treatment not carried out due to patient leaving prior to being seen by health care provider: Secondary | ICD-10-CM | POA: Insufficient documentation

## 2022-05-15 LAB — COMPREHENSIVE METABOLIC PANEL
ALT: 15 U/L (ref 0–44)
AST: 23 U/L (ref 15–41)
Albumin: 4.2 g/dL (ref 3.5–5.0)
Alkaline Phosphatase: 47 U/L (ref 38–126)
Anion gap: 11 (ref 5–15)
BUN: 22 mg/dL — ABNORMAL HIGH (ref 6–20)
CO2: 25 mmol/L (ref 22–32)
Calcium: 9.5 mg/dL (ref 8.9–10.3)
Chloride: 101 mmol/L (ref 98–111)
Creatinine, Ser: 1 mg/dL (ref 0.61–1.24)
GFR, Estimated: >60 mL/min (ref >60)
Glucose, Bld: 95 mg/dL (ref 70–99)
Potassium: 4 mmol/L (ref 3.5–5.1)
Sodium: 137 mmol/L (ref 135–145)
Total Bilirubin: 0.7 mg/dL (ref 0.3–1.2)
Total Protein: 9.9 g/dL — ABNORMAL HIGH (ref 6.5–8.1)

## 2022-05-15 LAB — URINALYSIS, ROUTINE W REFLEX MICROSCOPIC
Bacteria, UA: NONE SEEN
Bilirubin Urine: NEGATIVE
Glucose, UA: NEGATIVE mg/dL
Ketones, ur: 5 mg/dL — AB
Leukocytes,Ua: NEGATIVE
Nitrite: NEGATIVE
Protein, ur: 30 mg/dL — AB
Specific Gravity, Urine: 1.034 — ABNORMAL HIGH (ref 1.005–1.030)
pH: 5 (ref 5.0–8.0)

## 2022-05-15 LAB — CBC WITH DIFFERENTIAL/PLATELET
Abs Immature Granulocytes: 0.01 10*3/uL (ref 0.00–0.07)
Basophils Absolute: 0 10*3/uL (ref 0.0–0.1)
Basophils Relative: 1 %
Eosinophils Absolute: 0.1 10*3/uL (ref 0.0–0.5)
Eosinophils Relative: 1 %
HCT: 48.4 % (ref 39.0–52.0)
Hemoglobin: 16 g/dL (ref 13.0–17.0)
Immature Granulocytes: 0 %
Lymphocytes Relative: 32 %
Lymphs Abs: 1.8 10*3/uL (ref 0.7–4.0)
MCH: 30.8 pg (ref 26.0–34.0)
MCHC: 33.1 g/dL (ref 30.0–36.0)
MCV: 93.3 fL (ref 80.0–100.0)
Monocytes Absolute: 0.6 10*3/uL (ref 0.1–1.0)
Monocytes Relative: 10 %
Neutro Abs: 3.2 10*3/uL (ref 1.7–7.7)
Neutrophils Relative %: 56 %
Platelets: 292 10*3/uL (ref 150–400)
RBC: 5.19 MIL/uL (ref 4.22–5.81)
RDW: 13.8 % (ref 11.5–15.5)
WBC: 5.7 10*3/uL (ref 4.0–10.5)
nRBC: 0 % (ref 0.0–0.2)

## 2022-05-15 LAB — LIPASE, BLOOD: Lipase: 29 U/L (ref 11–51)
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
## 2022-05-16 ENCOUNTER — Other Ambulatory Visit (HOSPITAL_COMMUNITY): Payer: Self-pay

## 2022-06-01 ENCOUNTER — Other Ambulatory Visit (HOSPITAL_COMMUNITY): Payer: Self-pay

## 2022-06-22 ENCOUNTER — Telehealth: Payer: Self-pay

## 2022-06-22 NOTE — Telephone Encounter (Signed)
FYI

## 2022-06-22 NOTE — Telephone Encounter (Signed)
[facility] Patient Advocate Encounter  [facility] and I have been unsuccsessful in reaching patient to be able to refill medication.    Patient have not filled since 05/23 , his last prescription was sent to the clinic which was nevr picked up.  We have tried multiple times without a response.  Clearance Coots, CPhT Specialty Pharmacy Patient [facility] Phone: [phone] Fax:  [phone]

## 2022-06-23 ENCOUNTER — Telehealth: Payer: Self-pay

## 2022-06-23 NOTE — Telephone Encounter (Signed)
Gave patient info to Dwain Sarna to work on locating patient. [facility] pharmacy unable to reach and Triage team unable to reach. Last RX 04/2022.

## 2022-06-23 NOTE — Telephone Encounter (Signed)
I have sent a secure email to [facility] requesting he look into this one. Keep you posted when he replies.

## 2022-07-21 ENCOUNTER — Other Ambulatory Visit (HOSPITAL_COMMUNITY): Payer: Self-pay

## 2022-07-25 ENCOUNTER — Other Ambulatory Visit (HOSPITAL_COMMUNITY): Payer: Self-pay

## 2022-08-23 ENCOUNTER — Other Ambulatory Visit (HOSPITAL_COMMUNITY): Payer: Self-pay

## 2022-09-14 ENCOUNTER — Ambulatory Visit: Payer: Medicaid Other | Admitting: Infectious Disease

## 2022-11-11 ENCOUNTER — Telehealth (HOSPITAL_COMMUNITY): Payer: Self-pay | Admitting: Psychiatry

## 2022-11-11 ENCOUNTER — Emergency Department (HOSPITAL_COMMUNITY)
Admission: EM | Admit: 2022-11-11 | Discharge: 2022-11-11 | Discharge status: Against Medical Advice | Payer: Medicaid Other | Attending: Emergency Medicine | Admitting: Emergency Medicine

## 2022-11-11 DIAGNOSIS — R451 Restlessness and agitation: Secondary | ICD-10-CM | POA: Diagnosis present

## 2022-11-11 DIAGNOSIS — Z5321 Procedure and treatment not carried out due to patient leaving prior to being seen by health care provider: Secondary | ICD-10-CM | POA: Diagnosis not present

## 2022-11-11 NOTE — ED Notes (Signed)
Patient left without being seen upon beginning triage. Upon entering room patient uncooperative regarding questions upon patient's complaint. When asked "How can we help you?" And "what caused you to come to the emergency department today?" Patient stated "I don't want to think right now, that expends to much energy." Patient unwilling to elaborate on these statements, became agitated, and asked for a cup of water. Patient became increasingly agitated that he was denied a cup of water without providing more information about why he had come to be evaluated and stated "I'm just going to go." And patient left the department.

## 2022-11-22 ENCOUNTER — Telehealth (HOSPITAL_COMMUNITY): Payer: Medicaid Other | Admitting: Psychiatry

## 2022-11-24 ENCOUNTER — Ambulatory Visit (HOSPITAL_COMMUNITY): Payer: Medicaid Other | Admitting: Psychiatry

## 2022-12-13 ENCOUNTER — Other Ambulatory Visit (HOSPITAL_COMMUNITY): Payer: Self-pay

## 2023-04-11 ENCOUNTER — Encounter (HOSPITAL_COMMUNITY): Payer: Self-pay

## 2023-04-11 ENCOUNTER — Emergency Department (HOSPITAL_COMMUNITY)
Admission: EM | Admit: 2023-04-11 | Discharge: 2023-04-11 | Disposition: A | Discharge status: Home / Self Care | Payer: Medicaid Other | Attending: Emergency Medicine | Admitting: Emergency Medicine

## 2023-04-11 ENCOUNTER — Other Ambulatory Visit: Payer: Self-pay

## 2023-04-11 DIAGNOSIS — M25562 Pain in left knee: Secondary | ICD-10-CM | POA: Insufficient documentation

## 2023-04-11 DIAGNOSIS — Z5900 Homelessness unspecified: Secondary | ICD-10-CM | POA: Insufficient documentation

## 2023-04-11 DIAGNOSIS — Z21 Asymptomatic human immunodeficiency virus [HIV] infection status: Secondary | ICD-10-CM | POA: Insufficient documentation

## 2023-04-11 MED ORDER — DICLOFENAC SODIUM 1 % EX GEL: 2.0000 g | Freq: Four times a day (QID) | CUTANEOUS | 0 refills | Status: DC

## 2023-04-11 MED ORDER — IBUPROFEN 400 MG PO TABS
600.0000 mg | ORAL_TABLET | Freq: Once | ORAL | Status: AC
  Administered 2023-04-11: 600 mg via ORAL
  Filled 2023-04-11: qty 1

## 2023-04-11 NOTE — Discharge Instructions (Addendum)
You are being given resources for homeless shelters.  You are given prescription for Voltaren gel to help with your pain in your knee.  Follow-up with your primary care doctor.  Try to rest the knee when you can. You have not had refills left of your Biktarvy at the [facility] health community pharmacy, please pick this up and resume taking it.

## 2023-04-11 NOTE — ED Triage Notes (Signed)
Pt was evicted from apartment last week and is now homeless and looking for homeless shelters. Pt also has left knee pain from walking so much now. Pt is able to ambulate

## 2023-04-11 NOTE — ED Provider Notes (Signed)
[place] EMERGENCY DEPARTMENT AT [facility] Provider Note   CSN: [id number] Arrival date & time: 04/11/23  1912     History  Chief Complaint  Patient presents with   Homeless    Need social work consult for homeless shelters   Knee Pain    Kenneth Welch. is a 37 y.o. male.He has PMHx of schizoaffective d/o and HIV. He comes in to the ER today asking for resources for homeless shelters. He was evicted from his apartment a week ago and needs somewhere to stay. He has a sister, but she will not let him stay with her.  He also reports left anterior knee pain when walking due to increase in walking this week. He denies injury or swelling. No fever or redness. He is able to ambulate without difficulty  Knee Pain      Home Medications Prior to Admission medications   Medication Sig Start Date End Date Taking? Authorizing Provider  diclofenac Sodium (VOLTAREN) 1 % GEL Apply 2 g topically 4 (four) times daily. 04/11/23  Yes Debarah Mccumbers A, PA-C  acetaminophen (TYLENOL) 325 MG tablet Take 2 tablets (650 mg total) by mouth every 6 (six) hours as needed for mild pain or moderate pain. Patient not taking: Reported on 02/04/2022 07/20/20   Terald Sleeper, MD  ARIPiprazole (ABILIFY) 10 MG tablet Take 1 tablet (10 mg total) by mouth daily. 07/01/21   Arfeen, Phillips Grout, MD  benztropine (COGENTIN) 0.5 MG tablet Take 1 tablet (0.5 mg total) by mouth at bedtime. 07/01/21   Arfeen, Phillips Grout, MD  bictegravir-emtricitabine-tenofovir AF (BIKTARVY) 50-200-25 MG TABS tablet Take 1 tablet by mouth daily. 02/23/22   Randall Hiss, MD  doxycycline (VIBRA-TABS) 100 MG tablet Take two tablets by mouth after sex to prevent STI 02/09/22   Daiva Eves, Lisette Grinder, MD      Allergies    Patient has no known allergies.    Review of Systems   Review of Systems  Physical Exam Updated Vital Signs BP 122/83 (BP Location: Right Arm)   Pulse 85   Temp 98.4 F (36.9 C) (Oral)   Resp 16   Ht  6\' 3"  (1.905 m)   Wt 74.8 kg   SpO2 100%   BMI 20.62 kg/m  Physical Exam Vitals and nursing note reviewed.  Constitutional:      General: He is not in acute distress.    Appearance: He is well-developed.  HENT:     Head: Normocephalic and atraumatic.  Eyes:     Conjunctiva/sclera: Conjunctivae normal.  Cardiovascular:     Rate and Rhythm: Normal rate and regular rhythm.     Heart sounds: No murmur heard. Pulmonary:     Effort: Pulmonary effort is normal. No respiratory distress.     Breath sounds: Normal breath sounds.  Abdominal:     Palpations: Abdomen is soft.     Tenderness: There is no abdominal tenderness.  Musculoskeletal:        General: No swelling.     Cervical back: Neck supple.     Comments: Right knee has no swelling, no warmth, he can bear full weight and ambulate, full flexion extension against resistance without difficulty.  No crepitus.  Ambulation is normal.  Skin:    General: Skin is warm and dry.     Capillary Refill: Capillary refill takes less than 2 seconds.  Neurological:     Mental Status: He is alert.  Psychiatric:  Mood and Affect: Mood normal.     ED Results / Procedures / Treatments   Labs (all labs ordered are listed, but only abnormal results are displayed) Labs Reviewed - No data to display  EKG None  Radiology No results found.  Procedures Procedures    Medications Ordered in ED Medications  ibuprofen (ADVIL) tablet 600 mg (600 mg Oral Given 04/11/23 2113)    ED Course/ Medical Decision Making/ A&P                             Medical Decision Making DDx: Homelessness, sprain, strain, contusion, arthritis, other  ED course: Patient presents the ER asking for resources for shelters due to finding himself without anywhere to live for over the past week.  He is alert and oriented.  Notes he has not taken his Biktarvy for his HIV in the past 2 months as he ran out.  I was able to see that his last fill was in March but he  has 9 more refills and discussed that he can get this filled at his pharmacy.  He was given list of resources for homelessness.  Regarding his right knee pain this is been gradual onset mild pain that is atraumatic since walking a lot in the past week.  Do not feel x-rays are needed at this time emergently.  Given Voltaren gel to help with discomfort advised follow-up and return precautions.  He has no swelling or redness or fevers to indicate a septic arthritis.  Reviewed patient's EMR and his medication list           Final Clinical Impression(s) / ED Diagnoses Final diagnoses:  Homeless  Acute pain of left knee    Rx / DC Orders ED Discharge Orders          Ordered    diclofenac Sodium (VOLTAREN) 1 % GEL  4 times daily        04/11/23 2103              Ma Rings, PA-C 04/12/23 0000    Wynetta Fines, MD 04/12/23 0030

## 2023-05-07 ENCOUNTER — Other Ambulatory Visit: Payer: Self-pay

## 2023-05-07 ENCOUNTER — Emergency Department (HOSPITAL_COMMUNITY)
Admission: EM | Admit: 2023-05-07 | Discharge: 2023-05-07 | Disposition: A | Discharge status: Home / Self Care | Payer: Medicaid Other | Attending: Emergency Medicine | Admitting: Emergency Medicine

## 2023-05-07 DIAGNOSIS — Z21 Asymptomatic human immunodeficiency virus [HIV] infection status: Secondary | ICD-10-CM | POA: Insufficient documentation

## 2023-05-07 DIAGNOSIS — J45909 Unspecified asthma, uncomplicated: Secondary | ICD-10-CM | POA: Diagnosis not present

## 2023-05-07 DIAGNOSIS — G4700 Insomnia, unspecified: Secondary | ICD-10-CM | POA: Diagnosis present

## 2023-05-07 DIAGNOSIS — Z7951 Long term (current) use of inhaled steroids: Secondary | ICD-10-CM | POA: Diagnosis not present

## 2023-05-07 NOTE — Discharge Instructions (Signed)
Substance Abuse Treatment Programs ° °Intensive Outpatient Programs °[facility]     °[address]      °[place], [place]                   °[phone]      ° °[facility] °[address] °[place], [place] °[phone] ° °[facility]     °(Inpatient and outpatient)     °[address]           °[phone]   ° °[facility] °[phone] (Suboxone and Methadone) ° °[address]      °[place], [place] [address]      °[phone]      ° °[address] °[place], [place] °852-3033 ° °[facility] (Outpatient/Inpatient, Chemical)    °(insurance only) [phone]      °       °[facility] (Groups & Residential) °[place], [place] °[phone] ° °   °[facility]     °[address]     °[place], [place]      °[phone]      ° °[facility] (for caregivers and family) °[address] °[place], [place] °336-299-4655 ° ° ° ° ° °Residential Treatment Programs °[facility]      °[address], [place], [place] [address]  °([phone]      ° °[facility] °[address] [place], [place] [address] °[phone] ° °[facility]        °336-248-8914      ° °[facility] °1-800-659-3381 ° °[facility]             °[address]                                         °[place], [place]                                                °877-615-2722 or 336-784-9470                              ° °[facility] [place] °[address] °[place], [address] °1.877.941.8954 ° °[facility]    °[address]      °[place], [place]     °[phone]      ° °[facility] °[address] °, [place] °336-285-9073 ° °[facility]   °[address]     °[place], [place] [address]     °[phone]      °Admissions: 8am-3pm M-F ° °[facility] °[address] °[place],  [place] °336-227-7417 ° °[facility]: Residential Program (90 Days)   °[place], [place]      °336-725-8389 or 800-758-6077    ° °[facility] °[place],  °(Walk in Hours over the weekend or by referral) ° °[facility] °[address], [place],  [address] °([phone] ° °Crisis Mobile: [facility]:  1-877-626-1772 (for crisis response 24 hours a day) °[facility] Hotline:      1-800-256-2452 °Outpatient Psychiatry and Counseling ° °[facility]: Mobile Crisis   Management 24 hours:  1-877-626-1772 ° °[facility] sliding scale fee and walk in schedule: M-F 8am-12pm/1pm-3pm °[address]  °[place], [place] [address] °[phone] ° °[facility] °[address] °[place], [place] [address] °[phone] ° °[facility] ([facility])- new patient walk-in appointments available Monday - Friday 8am -3pm.          °[address] °[place], [place] [address] °336-676-6840 or crisis line- 336-676-6905 ° °[facility] Intensive Outpatient Therapy Program °[address] °[place], [place] [address] °336-832-9804 ° °[facility]                  °Crisis Services      °[phone]      °[address]     °[place], [place] [address]                ° °[facility]   °[facility] °[phone] °[address] °[place], [place] [address] ° ° °[facility]          °[address]  °[place], [place] [address]       °[phone] ° °[facility] °[address] °[place], [place] [address] °336-292-1510 ° °[facility]    °[address]    °[place], [place] [address]     °[phone]      ° °Parish McKinney, MD     °[address]     °[place] [address]     °336-282-1251     °  °[facility] °[address] °[place] [address] ° °[facility]     °[address]     °[place], [place]      °336-542-2076      ° °[facility] °Shamsher Ahluwalia, MD °[address] °[place], [place] [address] °[phone] ° °[facility]     °[address]     °[place], [place] [address]     °336-274-1237      ° °[facility] °[address] °[place], [place] [address] °336-854-4450 °Resources for Temporary Residential Assistance/Crisis Centers ° °DAY CENTERS °[facility] °M-F 8am-3pm   °[address] [place], [place] [address]   336-332-0824 °Services include: laundry, barbering, support groups, case management, phone  & computer access, showers, AA/NA mtgs, mental health/substance abuse nurse, job skills class, disability information, VA assistance, spiritual classes, etc.  ° °HOMELESS SHELTERS ° °[facility]     °[facility]   °[address], [place]     °336.271.5959       °       °[facility] (women and children)       °[address] °[place],  [address] °336-275-0820 °[email or web address] for application and process °Application Required ° °[facility]   °[address]    °[place]  [address]     °[phone]       °             °[facility] °[address] °[place],  [address] °336.273.5572 °336-235-0363(schedule application appt.) °Application Required ° °[facility] (women only)    °[address]     °[place],  [address]     °336-884-1039      °  Intake starts 6pm daily °Need valid ID, SSC, & Police report °[facility] [place] °[address] °[place], [place] °336-881-5420 °Application Required ° °[facility] (men only)     °[address]      °[place], [place]     °[phone]      ° °[facility] °(Pregnant women only) °[address] °[place], [place] °336-275-0206 ° °[facility]      °[address]      °[place], [place] [address]     °[phone]      °       °[facility] °[address] °[place], [place] °336-723-1848 °90 day commitment/SA/Application process ° °[facility](men only)     °[address]     °[place], [place]     °[phone]       °Check-in at 7pm     °       °[facility] °[address] °[place], [place] [address] °336-248-6684 °Men/Women/Women and Children must be there by 7 pm ° °[facility] °[place], [place] °336-722-8721                ° °
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
## 2023-05-09 ENCOUNTER — Other Ambulatory Visit: Payer: Self-pay

## 2023-05-09 ENCOUNTER — Encounter (HOSPITAL_COMMUNITY): Payer: Self-pay

## 2023-05-09 ENCOUNTER — Emergency Department (HOSPITAL_COMMUNITY)
Admission: EM | Admit: 2023-05-09 | Discharge: 2023-05-10 | Disposition: A | Discharge status: Home / Self Care | Payer: Medicaid Other | Attending: Emergency Medicine | Admitting: Emergency Medicine

## 2023-05-09 DIAGNOSIS — S90822A Blister (nonthermal), left foot, initial encounter: Secondary | ICD-10-CM | POA: Diagnosis not present

## 2023-05-09 DIAGNOSIS — J45909 Unspecified asthma, uncomplicated: Secondary | ICD-10-CM | POA: Diagnosis not present

## 2023-05-09 DIAGNOSIS — X58XXXA Exposure to other specified factors, initial encounter: Secondary | ICD-10-CM | POA: Diagnosis not present

## 2023-05-09 DIAGNOSIS — R45850 Homicidal ideations: Secondary | ICD-10-CM | POA: Diagnosis not present

## 2023-05-09 DIAGNOSIS — F32A Depression, unspecified: Secondary | ICD-10-CM | POA: Diagnosis present

## 2023-05-09 DIAGNOSIS — S90821A Blister (nonthermal), right foot, initial encounter: Secondary | ICD-10-CM | POA: Diagnosis not present

## 2023-05-09 DIAGNOSIS — Z7689 Persons encountering health services in other specified circumstances: Secondary | ICD-10-CM

## 2023-05-09 DIAGNOSIS — Z1152 Encounter for screening for COVID-19: Secondary | ICD-10-CM | POA: Diagnosis not present

## 2023-05-09 DIAGNOSIS — Z21 Asymptomatic human immunodeficiency virus [HIV] infection status: Secondary | ICD-10-CM | POA: Diagnosis not present

## 2023-05-09 DIAGNOSIS — J069 Acute upper respiratory infection, unspecified: Secondary | ICD-10-CM

## 2023-05-09 DIAGNOSIS — R45851 Suicidal ideations: Secondary | ICD-10-CM | POA: Diagnosis not present

## 2023-05-09 LAB — COMPREHENSIVE METABOLIC PANEL
ALT: 19 U/L (ref 0–44)
AST: 27 U/L (ref 15–41)
Albumin: 3.7 g/dL (ref 3.5–5.0)
Alkaline Phosphatase: 72 U/L (ref 38–126)
Anion gap: 10 (ref 5–15)
BUN: 19 mg/dL (ref 6–20)
CO2: 24 mmol/L (ref 22–32)
Calcium: 9 mg/dL (ref 8.9–10.3)
Chloride: 100 mmol/L (ref 98–111)
Creatinine, Ser: 1.2 mg/dL (ref 0.61–1.24)
GFR, Estimated: >60 mL/min (ref >60)
Glucose, Bld: 103 mg/dL — ABNORMAL HIGH (ref 70–99)
Potassium: 4.5 mmol/L (ref 3.5–5.1)
Sodium: 134 mmol/L — ABNORMAL LOW (ref 135–145)
Total Bilirubin: 0.6 mg/dL (ref 0.3–1.2)
Total Protein: 7.8 g/dL (ref 6.5–8.1)

## 2023-05-09 LAB — CBC
HCT: 42 % (ref 39.0–52.0)
Hemoglobin: 14.1 g/dL (ref 13.0–17.0)
MCH: 31.1 pg (ref 26.0–34.0)
MCHC: 33.6 g/dL (ref 30.0–36.0)
MCV: 92.7 fL (ref 80.0–100.0)
Platelets: 284 10*3/uL (ref 150–400)
RBC: 4.53 MIL/uL (ref 4.22–5.81)
RDW: 14.6 % (ref 11.5–15.5)
WBC: 8.8 10*3/uL (ref 4.0–10.5)
nRBC: 0 % (ref 0.0–0.2)

## 2023-05-09 LAB — RAPID URINE DRUG SCREEN, HOSP PERFORMED
Amphetamines: POSITIVE — AB
Barbiturates: NOT DETECTED
Benzodiazepines: NOT DETECTED
Cocaine: NOT DETECTED
Opiates: NOT DETECTED
Tetrahydrocannabinol: NOT DETECTED

## 2023-05-09 LAB — ACETAMINOPHEN LEVEL: Acetaminophen (Tylenol), Serum: <10 ug/mL — ABNORMAL LOW (ref 10–30)

## 2023-05-09 LAB — ETHANOL: Alcohol, Ethyl (B): <10 mg/dL (ref <10)

## 2023-05-09 LAB — SALICYLATE LEVEL: Salicylate Lvl: <7 mg/dL — ABNORMAL LOW (ref 7.0–30.0)

## 2023-05-09 NOTE — ED Triage Notes (Signed)
Pt arrives via EMS. Pt reports blisters and wounds to bilateral feet that have been ongoing for months. Pt reports thoughts of suicide and homicide for the past couple of days.

## 2023-05-09 NOTE — BH Assessment (Signed)
This TTS consult has been deferred to IR[facility]. Marijean Bravo, IRIS Coordinator, can be reached at [phone]. This patient will be assessed by Deeann Cree, NP, at approx 10:30pm. Secure chat initiated.
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
## 2023-05-09 NOTE — ED Notes (Signed)
Patient belongings placed in locker 6 in purple zone. Inventory checklist is filled out & placed in clear slot in front of locker.
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
## 2023-05-09 NOTE — ED Notes (Signed)
Per triage tech, 2 pt belonging bags placed in locker 6. Additional 3rd bag containing sandals and jacket placed by this RN into locker 6.

## 2023-05-10 LAB — RESP PANEL BY RT-PCR (RSV, FLU A&B, COVID)  RVPGX2
Influenza A by PCR: NEGATIVE
Influenza B by PCR: NEGATIVE
Resp Syncytial Virus by PCR: NEGATIVE
SARS Coronavirus 2 by RT PCR: NEGATIVE

## 2023-05-10 MED ORDER — ACETAMINOPHEN 325 MG PO TABS
650.0000 mg | ORAL_TABLET | ORAL | Status: DC | PRN
  Administered 2023-05-10: 650 mg via ORAL
  Filled 2023-05-10: qty 2

## 2023-05-10 NOTE — Discharge Instructions (Signed)
You were seen in the emergency department for your foot pain and your suicidal ideations.  You had no signs of infection to your feet and psychiatry was cleared you to follow-up outpatient for further management.  You did have a fever here as well with a runny nose and likely for viral infection.  You did test negative for COVID and flu.  You can follow-up with your primary doctor and your psychiatrist as planned.  You should return to the emergency department if you are having worsening shortness of breath, severe chest pain, thoughts of wanting to hurt yourself or others or any other new or concerning symptoms.

## 2023-05-10 NOTE — ED Notes (Signed)
Spoke with Dr. Theresia Lo regarding fever and no diet order. Will review chart.

## 2023-05-10 NOTE — ED Notes (Signed)
AVS provided to and discussed with patient and family member at bedside. Pt verbalizes understanding of discharge instructions and denies any questions or concerns at this time. Pt ambulated out of department independently with steady gait. ° °

## 2023-05-10 NOTE — ED Notes (Signed)
Pt temp elevated 100.6, Dr C. Pollina notified via Epic message

## 2023-05-10 NOTE — BH Assessment (Signed)
Comprehensive Clinical Assessment (CCA) Note  05/10/2023 Kenneth Welch [id number]  Disposition: Clinical report given to Sindy Guadeloupe, NP who states patient is psychiatrically cleared with a recommendation for outpatient follow-up. Patient provided information for [facility] and [facility] mobile crisis.   The patient demonstrates the following risk factors for suicide: Chronic risk factors for suicide include: psychiatric disorder of schizophrenia . Acute risk factors for suicide include: unemployment. Protective factors for this patient include: hope for the future. Considering these factors, the overall suicide risk at this point appears to be low. Patient is appropriate for outpatient follow up.  Kenneth Welch. is a 37 year old single male who presents to [facility] ED due to blisters on his feet, suicidal and homicidal ideations. Patient has a diagnosis of schizophrenia. Patient reports he has been suicidal for the past couple of days due to be being homeless and without transportation. Patient denies having a plan. Patient denies a history of suicide attempts or self-harm. Patient states he was also feeling homicidal towards people that caused his homelessness.  Patient reports he is not suicidal or homicidal currently. Patient denies current audio or visual hallucinations. He states he has experienced hallucinations when he was sleep deprived. Patient reports he used $30 worth of methamphetamine three days ago. He says he uses it about two times per month. Patient UDS is positive for amphetamines. Patient denies access to guns or weapons.  Patient identified homelessness and lack of transportation as primary stressors. Patient reports he has been homeless since the beginning of April. He says he has been staying at the [facility] and [employer]. Patient reports he has a sister who lives locally; however, she will not allow patient to stay at her  home. Patient denies having any supports. Patient receives disability income. Patient denies any history of abuse or trauma. Patient denies legal problems.  Patient is not currently connected to any mental health resources. Patient reports he is supposed to be taking Abilify; however, he has not taken it in months. Per chart, patient is diagnosed HIV and asthma.  Patient is dressed in scrubs and alert with normal speech. Patient makes good eye contact. Patient has a flat affect and is calm. Patient's thought process is linear. There is no indication patient is responding to internal stimuli. Patient was cooperative throughout the assessment.   Chief Complaint:  Chief Complaint  Patient presents with   Blister   Psychiatric Evaluation   Suicidal   Visit Diagnosis: Depression    CCA Screening, Triage and Referral (STR)  Patient Reported Information How did you hear about [place]? Self  What Is the Reason for Your Visit/Call Today? Kenneth Welch. is a 37 year old single male who presents to [facility] ED due to blisters on his feet, suicidal and homicidal ideations. Patient has a diagnosis of schizophrenia. Patient reports he has been suicidal for the past couple of days due to be being homeless and without transportation. Patient denies having a plan. Patient states he was also feeling homicidal towards people that caused his homelessness.  Patient reports he is not suicidal or homicidal currently. Patient denies current audio or visual hallucinations. He states he has experienced hallucinations when he was sleep deprived. Patient reports he used $30 worth of methamphetamine three days ago. He says he uses it about two times per month. Patient UDS is positive for amphetamines. Patient denies access to guns or weapons.  How Long Has This Been Causing You Problems? <Week  What Do You Feel Would Help You the Most Today? Treatment for Depression or other mood problem   Have You Recently Had  Any Thoughts About Hurting Yourself? Yes  Are You Planning to Commit Suicide/Harm Yourself At This time? No   Flowsheet Row ED from 05/09/2023 in [facility] Emergency Department at [facility] ED from 05/07/2023 in [facility] Emergency Department at [facility] ED from 04/11/2023 in [facility] Emergency Department at [facility]  C-SSRS RISK CATEGORY Low Risk No Risk No Risk       Have you Recently Had Thoughts About Hurting Someone Kenneth Welch? Yes  Are You Planning to Harm Someone at This Time? No  Explanation: N/A   Have You Used Any Alcohol or Drugs in the Past 24 Hours? No  What Did You Use and How Much? N/A   Do You Currently Have a Therapist/Psychiatrist? No  Name of Therapist/Psychiatrist: Name of Therapist/Psychiatrist: N/A   Have You Been Recently Discharged From Any Office Practice or Programs? No  Explanation of Discharge From Practice/Program: N/A     CCA Screening Triage Referral Assessment Type of Contact: Tele-Assessment  Telemedicine Service Delivery: Telemedicine service delivery: This service was provided via telemedicine using a 2-way, interactive audio and video technology  Is this Initial or Reassessment? Is this Initial or Reassessment?: Initial Assessment  Date Telepsych consult ordered in [facility]:  Date Telepsych consult ordered in [facility]: 05/09/23  Time Telepsych consult ordered in [facility]:  Time Telepsych consult ordered in [facility]: 1635  Location of Assessment: [facility] ED  Provider Location: [facility] Assessment Services   Collateral Involvement: None   Does Patient Have a Automotive engineer Guardian? No  Legal Guardian Contact Information: N/A  Copy of Legal Guardianship Form: -- (N/A)  Legal Guardian Notified of Arrival: -- (N/A)  Legal Guardian Notified of Pending Discharge: -- (N/A)  If Minor and Not Living with Parent(s), Who has Custody? N/A  Is CPS involved or ever been involved? Never  Is APS involved or ever been  involved? Never   Patient Determined To Be At Risk for Harm To Self or Others Based on Review of Patient Reported Information or Presenting Complaint? No (denies current SI/HI)  Method: No Plan  Availability of Means: No access or NA (denies current SI/HI)  Intent: Vague intent or NA (denies current SI/HI)  Notification Required: No need or identified person (denies current SI/HI)  Additional Information for Danger to Others Potential: -- (N/A)  Additional Comments for Danger to Others Potential: N/A  Are There Guns or Other Weapons in Your Home? No  Types of Guns/Weapons: N/A  Are These Weapons Safely Secured?                            -- (N/A)  Who Could Verify You Are Able To Have These Secured: N/A  Do You Have any Outstanding Charges, Pending Court Dates, Parole/Probation? Patient denies.  Contacted To Inform of Risk of Harm To Self or Others: -- (N/A)    Does Patient Present under Involuntary Commitment? No    [place] of Residence: [place]   Patient Currently Receiving the Following Services: Not Receiving Services   Determination of Need: Routine (7 days)   Options For Referral: Medication Management     CCA Biopsychosocial Patient Reported Schizophrenia/Schizoaffective Diagnosis in Past: Yes   Strengths: Patient friendly   Mental Health Symptoms Depression:   Sleep (too much or little)  Duration of Depressive symptoms:  Duration of Depressive Symptoms: Less than two weeks   Mania:   None   Anxiety:    None   Psychosis:   None   Duration of Psychotic symptoms:    Trauma:   None   Obsessions:   None   Compulsions:   None   Inattention:   None   Hyperactivity/Impulsivity:   None   Oppositional/Defiant Behaviors:   None   Emotional Irregularity:   None   Other Mood/Personality Symptoms:   N/A    Mental Status Exam Appearance and self-care  Stature:   Tall   Weight:   Average weight   Clothing:   --  (Scrubs)   Grooming:   Normal   Cosmetic use:   None   Posture/gait:   Normal   Motor activity:   Not Remarkable   Sensorium  Attention:   Normal   Concentration:   Normal   Orientation:   X5   Recall/memory:   Normal   Affect and Mood  Affect:   Appropriate   Mood:   Other (Comment) (Calm)   Relating  Eye contact:   Normal   Facial expression:   Constricted   Attitude toward examiner:   Cooperative   Thought and Language  Speech flow:  Normal   Thought content:   Appropriate to Mood and Circumstances   Preoccupation:   None   Hallucinations:   None   Organization:   Linear   Company secretary of Knowledge:   Average   Intelligence:   Average   Abstraction:   Normal   Judgement:   Fair   Dance movement psychotherapist:   Adequate   Insight:   Fair   Decision Making:   Impulsive   Social Functioning  Social Maturity:   Isolates   Social Judgement:   "Street Smart"   Stress  Stressors:   Housing; Other (Comment) Teaching laboratory technician)   Coping Ability:   Human resources officer Deficits:   None   Supports:   Support needed     Religion: Religion/Spirituality Are You A Religious Person?: Yes What is Your Religious Affiliation?: Christian How Might This Affect Treatment?: N/A  Leisure/Recreation: Leisure / Recreation Do You Have Hobbies?: No  Exercise/Diet: Exercise/Diet Do You Exercise?: No Have You Gained or Lost A Significant Amount of Weight in the Past Six Months?: No Do You Follow a Special Diet?: No Do You Have Any Trouble Sleeping?: Yes Explanation of Sleeping Difficulties: Patient reports getting poor sleep recently   CCA Employment/Education Employment/Work Situation: Employment / Work Situation Employment Situation: On disability Why is Patient on Disability: Due to schizophrenia diagnosis How Long has Patient Been on Disability: Since 2013 Patient's Job has Been Impacted by Current Illness: No Has  Patient ever Been in the [employer]?: No  Education: Education Is Patient Currently Attending School?: No Last Grade Completed: 12 Did You Attend College?: No Did You Have An Individualized Education Program (IIEP): No Did You Have Any Difficulty At School?: No Patient's Education Has Been Impacted by Current Illness: No   CCA Family/Childhood History Family and Relationship History: Family history Marital status: Single Does patient have children?: No  Childhood History:  Childhood History By whom was/is the patient raised?: Both parents Did patient suffer any verbal/emotional/physical/sexual abuse as a child?: No Did patient suffer from severe childhood neglect?: No Has patient ever been sexually abused/assaulted/raped as an adolescent or adult?: No Was the patient ever a victim of a crime  or a disaster?: No Witnessed domestic violence?: No Has patient been affected by domestic violence as an adult?: No       CCA Substance Use Alcohol/Drug Use: Alcohol / Drug Use Pain Medications: See MAR Prescriptions: See MAR Over the Counter: See MAR History of alcohol / drug use?: Yes Longest period of sobriety (when/how long): Unknown Negative Consequences of Use:  (N/A) Withdrawal Symptoms: None Substance #1 Name of Substance 1: Amphetamines 1 - Age of First Use: 34 1 - Amount (size/oz): $30 worth 1 - Frequency: Twice monthly 1 - Duration: Ongoing 1 - Last Use / Amount: 3 days ago 1 - Method of Aquiring: Illegal Welch 1- Route of Use: Unknown                       ASAM's:  Six Dimensions of Multidimensional Assessment  Dimension 1:  Acute Intoxication and/or Withdrawal Potential:   Dimension 1:  Description of individual's past and current experiences of substance use and withdrawal: Patient denies withdrawal symptoms  Dimension 2:  Biomedical Conditions and Complications:   Dimension 2:  Description of patient's biomedical conditions and  complications:  Patient has several medical conditions.  Dimension 3:  Emotional, Behavioral, or Cognitive Conditions and Complications:  Dimension 3:  Description of emotional, behavioral, or cognitive conditions and complications: Patient reports previous SI/HI  Dimension 4:  Readiness to Change:  Dimension 4:  Description of Readiness to Change criteria: Patient does not acknowledge a need for change  Dimension 5:  Relapse, Continued use, or Continued Problem Potential:  Dimension 5:  Relapse, continued use, or continued problem potential critiera description: Patient is lacking insight  Dimension 6:  Recovery/Living Environment:  Dimension 6:  Recovery/Iiving environment criteria description: Patient reports being homeless  ASAM Severity Score: ASAM's Severity Rating Score: 11  ASAM Recommended Level of Treatment: ASAM Recommended Level of Treatment: Level II Intensive Outpatient Treatment   Substance use Disorder (SUD) Substance Use Disorder (SUD)  Checklist Symptoms of Substance Use: Continued use despite persistent or recurrent social, interpersonal problems, caused or exacerbated by use  Recommendations for Services/Supports/Treatments: Recommendations for Services/Supports/Treatments Recommendations For Services/Supports/Treatments: Medication Management  Discharge Disposition:    DSM5 Diagnoses: Patient Active Problem List   Diagnosis Date Noted   HIV disease (HCC) 02/04/2022   Pain associated with defecation 07/13/2021   Gonorrhea 07/13/2021   Papules 07/13/2021   Monkeypox 07/13/2021   Chlamydia 07/13/2021   Acute HIV infection (HCC) 04/16/2021   Rectal bleeding 04/16/2021   Schizoaffective disorder (HCC) 04/10/2012     Referrals to Alternative Service(s): Referred to Alternative Service(s):   Place:   Date:   Time:    Referred to Alternative Service(s):   Place:   Date:   Time:    Referred to Alternative Service(s):   Place:   Date:   Time:    Referred to Alternative Service(s):    Place:   Date:   Time:     Cleda Clarks, LCSW

## 2023-05-10 NOTE — ED Provider Notes (Signed)
Emergency Medicine Observation Re-evaluation Note  Kenneth Welch. is a 37 y.o. male, seen on rounds today.  Pt initially presented to the ED for complaints of Blister, Psychiatric Evaluation, and Suicidal Currently, the patient is awake resting in bed comfortably in no acute distress.  He did have a fever this morning and reports feeling feverish.  He reports that he was having a runny nose for the last couple of days.  He denies any cough or shortness of breath, nausea, vomiting or diarrhea.  Physical Exam  BP 113/67 (BP Location: Left Arm)   Pulse 92   Temp (!) 100.6 F (38.1 C) (Oral)   Resp 18   SpO2 100%  Physical Exam General: Awake and alert, no acute distress Cardiac: Regular rate and rhythm Lungs: Clear to auscultation bilaterally, no increased work of breathing Psych: Calm, cooperative  ED Course / MDM  EKG:   I have reviewed the labs performed to date as well as medications administered while in observation.  Recent changes in the last 24 hours include patient was evaluated by psychiatry Sindy Guadeloupe NP) last night and was cleared for discharge.  He did have a fever this morning.  Viral panel was negative yesterday.  No signs of sepsis or bacterial infection at this time and likely has a viral syndrome..  Plan  Current plan is for discharge with outpatient resources.    Rexford Maus, DO 05/10/23 [phone]

## 2023-06-02 ENCOUNTER — Emergency Department (HOSPITAL_COMMUNITY)
Admission: EM | Admit: 2023-06-02 | Discharge: 2023-06-02 | Disposition: A | Discharge status: Home / Self Care | Payer: Medicaid Other | Attending: Emergency Medicine | Admitting: Emergency Medicine

## 2023-06-02 ENCOUNTER — Encounter (HOSPITAL_COMMUNITY): Payer: Self-pay

## 2023-06-02 DIAGNOSIS — Z5900 Homelessness unspecified: Secondary | ICD-10-CM

## 2023-06-02 DIAGNOSIS — F1290 Cannabis use, unspecified, uncomplicated: Secondary | ICD-10-CM | POA: Diagnosis not present

## 2023-06-02 DIAGNOSIS — Z599 Problem related to housing and economic circumstances, unspecified: Secondary | ICD-10-CM | POA: Diagnosis not present

## 2023-06-02 DIAGNOSIS — Z21 Asymptomatic human immunodeficiency virus [HIV] infection status: Secondary | ICD-10-CM | POA: Diagnosis not present

## 2023-06-02 DIAGNOSIS — Z5902 Unsheltered homelessness: Secondary | ICD-10-CM | POA: Insufficient documentation
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
## 2023-06-02 NOTE — Discharge Instructions (Addendum)
You can seek housing at one of the facilities on resource guide.

## 2023-06-02 NOTE — ED Triage Notes (Signed)
Usuaslly stays at the [facility], is unable to stay there tonight and this weekend, does not feel like he would be safe on the streets and looking for some resources for places to stay if possible. Does express smoking weed today and it potentially having something in the weed. No other complaints of pain or symptoms. No HI/SI.

## 2023-06-10 ENCOUNTER — Emergency Department (HOSPITAL_COMMUNITY)
Admission: EM | Admit: 2023-06-10 | Discharge: 2023-06-11 | Disposition: A | Discharge status: Home / Self Care | Payer: MEDICAID | Attending: Emergency Medicine | Admitting: Emergency Medicine

## 2023-06-10 ENCOUNTER — Emergency Department (HOSPITAL_COMMUNITY)
Admission: EM | Admit: 2023-06-10 | Discharge: 2023-06-10 | Disposition: A | Discharge status: Home / Self Care | Payer: MEDICAID | Source: Home / Self Care | Attending: Emergency Medicine | Admitting: Emergency Medicine

## 2023-06-10 ENCOUNTER — Emergency Department (HOSPITAL_COMMUNITY)
Admission: EM | Admit: 2023-06-10 | Discharge: 2023-06-10 | Discharge status: Against Medical Advice | Payer: MEDICAID | Attending: Emergency Medicine | Admitting: Emergency Medicine

## 2023-06-10 ENCOUNTER — Encounter (HOSPITAL_COMMUNITY): Payer: Self-pay | Admitting: Emergency Medicine

## 2023-06-10 ENCOUNTER — Other Ambulatory Visit: Payer: Self-pay

## 2023-06-10 ENCOUNTER — Encounter (HOSPITAL_COMMUNITY): Payer: Self-pay

## 2023-06-10 DIAGNOSIS — Z21 Asymptomatic human immunodeficiency virus [HIV] infection status: Secondary | ICD-10-CM | POA: Insufficient documentation

## 2023-06-10 DIAGNOSIS — Z5321 Procedure and treatment not carried out due to patient leaving prior to being seen by health care provider: Secondary | ICD-10-CM | POA: Diagnosis not present

## 2023-06-10 DIAGNOSIS — R369 Urethral discharge, unspecified: Secondary | ICD-10-CM | POA: Insufficient documentation

## 2023-06-10 DIAGNOSIS — R309 Painful micturition, unspecified: Secondary | ICD-10-CM | POA: Insufficient documentation

## 2023-06-10 DIAGNOSIS — F1290 Cannabis use, unspecified, uncomplicated: Secondary | ICD-10-CM | POA: Insufficient documentation

## 2023-06-10 LAB — URINALYSIS, ROUTINE W REFLEX MICROSCOPIC
Bilirubin Urine: NEGATIVE
Glucose, UA: NEGATIVE mg/dL
Hgb urine dipstick: NEGATIVE
Ketones, ur: NEGATIVE mg/dL
Nitrite: NEGATIVE
Protein, ur: NEGATIVE mg/dL
Specific Gravity, Urine: 1.026 (ref 1.005–1.030)
pH: 6 (ref 5.0–8.0)

## 2023-06-10 MED ORDER — CEFTRIAXONE SODIUM 250 MG IJ SOLR
250.0000 mg | Freq: Once | INTRAMUSCULAR | Status: AC
  Administered 2023-06-10: 250 mg via INTRAMUSCULAR
  Filled 2023-06-10: qty 250

## 2023-06-10 MED ORDER — LIDOCAINE HCL 1 % IJ SOLN
INTRAMUSCULAR | Status: AC
  Administered 2023-06-10: 1 mL
  Filled 2023-06-10: qty 20

## 2023-06-10 MED ORDER — AZITHROMYCIN 250 MG PO TABS
1000.0000 mg | ORAL_TABLET | Freq: Once | ORAL | Status: AC
  Administered 2023-06-10: 1000 mg via ORAL
  Filled 2023-06-10: qty 4

## 2023-06-10 NOTE — ED Provider Notes (Addendum)
[place] EMERGENCY DEPARTMENT AT [facility] Provider Note   CSN: [id number] Arrival date & time: 06/10/23  2259     History  Chief Complaint  Patient presents with   Penile Discharge    Kenneth Glasper. is a 37 y.o. male.  The history is provided by the patient and medical records.  Penile Discharge   37 y.o. M here with penile discharge.  Just discharged from here <1 hour ago.  Now states thick discharge from penis that began today.  Concern for possible STD.  Does have known HIV.  Home Medications Prior to Admission medications   Medication Sig Start Date End Date Taking? Authorizing Provider  acetaminophen (TYLENOL) 325 MG tablet Take 2 tablets (650 mg total) by mouth every 6 (six) hours as needed for mild pain or moderate pain. Patient not taking: Reported on 02/04/2022 07/20/20   Terald Sleeper, MD  ARIPiprazole (ABILIFY) 10 MG tablet Take 1 tablet (10 mg total) by mouth daily. 07/01/21   Arfeen, Phillips Grout, MD  benztropine (COGENTIN) 0.5 MG tablet Take 1 tablet (0.5 mg total) by mouth at bedtime. 07/01/21   Arfeen, Phillips Grout, MD  bictegravir-emtricitabine-tenofovir AF (BIKTARVY) 50-200-25 MG TABS tablet Take 1 tablet by mouth daily. 02/23/22   Randall Hiss, MD  diclofenac Sodium (VOLTAREN) 1 % GEL Apply 2 g topically 4 (four) times daily. 04/11/23   Carmel Sacramento A, PA-C  doxycycline (VIBRA-TABS) 100 MG tablet Take two tablets by mouth after sex to prevent STI 02/09/22   Daiva Eves, Lisette Grinder, MD      Allergies    Patient has no known allergies.    Review of Systems   Review of Systems  Genitourinary:  Positive for penile discharge.  All other systems reviewed and are negative.   Physical Exam Updated Vital Signs BP 130/73 (BP Location: Right Arm)   Pulse 83   Temp 98.5 F (36.9 C) (Oral)   Resp 19   SpO2 100%  Physical Exam Vitals and nursing note reviewed.  Constitutional:      Appearance: He is well-developed.  HENT:     Head:  Normocephalic and atraumatic.  Eyes:     Conjunctiva/sclera: Conjunctivae normal.     Pupils: Pupils are equal, round, and reactive to light.  Cardiovascular:     Rate and Rhythm: Normal rate and regular rhythm.     Heart sounds: Normal heart sounds.  Pulmonary:     Effort: Pulmonary effort is normal.     Breath sounds: Normal breath sounds.  Abdominal:     General: Bowel sounds are normal.     Palpations: Abdomen is soft.  Genitourinary:    Comments: deferred Musculoskeletal:        General: Normal range of motion.     Cervical back: Normal range of motion.  Skin:    General: Skin is warm and dry.  Neurological:     Mental Status: He is alert and oriented to person, place, and time.     ED Results / Procedures / Treatments   Labs (all labs ordered are listed, but only abnormal results are displayed) Labs Reviewed - No data to display   EKG None  Radiology No results found.  Procedures Procedures    Medications Ordered in ED Medications  cefTRIAXone (ROCEPHIN) injection 250 mg (250 mg Intramuscular Given 06/10/23 2350)  azithromycin (ZITHROMAX) tablet 1,000 mg (1,000 mg Oral Given 06/10/23 2350)  lidocaine (XYLOCAINE) 1 % (with pres) injection (1  mL  Given 06/10/23 2352)    ED Course/ Medical Decision Making/ A&P                             Medical Decision Making Risk Prescription drug management.   37 y.o. M here with penile discharged.  Checked into [facility] earlier today for same but did not stay for treatment.  Gc/chl collected earlier today, UA without acute signs of infection.  Given rocephin/azithromycin.  He will be notified if results are positive.  Can follow-up with health dept for routine screenings in the future.  Return here for new concerns.  Final Clinical Impression(s) / ED Diagnoses Final diagnoses:  Penile discharge    Rx / DC Orders ED Discharge Orders     None         Garlon Hatchet, PA-C 06/11/23 0001
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
## 2023-06-10 NOTE — ED Triage Notes (Signed)
BIBA - brought in for anxiety. Pt states he smokes weed for anxiety but doesn't take any meds for it. Family states he has hx of schizophrenia.

## 2023-06-10 NOTE — Discharge Instructions (Addendum)
You will be notified if your STD testing is positive. Can follow-up with the health department if you have additional concerns.
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
## 2023-06-10 NOTE — ED Triage Notes (Signed)
Pt reports burning with urination and penile discharge.

## 2023-06-10 NOTE — ED Provider Triage Note (Signed)
Emergency Medicine Provider Triage Evaluation Note  Kenneth Welch , a 37 y.o. male  was evaluated in triage.  Pt complains of penile discharge, dysuria.  He noticed this today.  He is sexually active with 1 male partner.  Intermittently uses protection.  He is unaware of any confirmed STD exposures.  He denies any testicle pain.  No fevers or chills, nausea or vomiting.  Review of Systems  Positive: As above Negative: As above  Physical Exam  BP 130/77 (BP Location: Right Arm)   Pulse 77   Temp 98 F (36.7 C) (Oral)   SpO2 99%  Gen:   Awake, no distress   Resp:  Normal effort  MSK:   Moves extremities without difficulty  Other:  GU exam deferred in triage  Medical Decision Making  Medically screening exam initiated at 3:12 PM.  Appropriate orders placed.  Kenneth Welch. was informed that the remainder of the evaluation will be completed by another provider, this initial triage assessment does not replace that evaluation, and the importance of remaining in the ED until their evaluation is complete.  Workup initiated   Michelle Piper, Cordelia Poche 06/10/23 1512

## 2023-06-10 NOTE — ED Triage Notes (Signed)
Pt states that he began having pain with urination and thick discharge from his penis earlier today.

## 2023-06-10 NOTE — ED Notes (Signed)
Unable to get labs stuck pt twice
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
## 2023-06-12 LAB — GC/CHLAMYDIA PROBE AMP (~~LOC~~) NOT AT ARMC
Chlamydia: NEGATIVE
Comment: NEGATIVE
Comment: NORMAL
Neisseria Gonorrhea: POSITIVE — AB

## 2023-06-13 ENCOUNTER — Ambulatory Visit (HOSPITAL_COMMUNITY)
Admission: EM | Admit: 2023-06-13 | Discharge: 2023-06-14 | Disposition: A | Discharge status: Home / Self Care | Payer: MEDICAID | Attending: Registered Nurse | Admitting: Registered Nurse

## 2023-06-13 ENCOUNTER — Encounter (HOSPITAL_COMMUNITY): Payer: Self-pay | Admitting: Registered Nurse

## 2023-06-13 DIAGNOSIS — F201 Disorganized schizophrenia: Secondary | ICD-10-CM

## 2023-06-13 DIAGNOSIS — F259 Schizoaffective disorder, unspecified: Secondary | ICD-10-CM | POA: Diagnosis not present

## 2023-06-13 DIAGNOSIS — F1290 Cannabis use, unspecified, uncomplicated: Secondary | ICD-10-CM | POA: Diagnosis not present

## 2023-06-13 DIAGNOSIS — Z5900 Homelessness unspecified: Secondary | ICD-10-CM | POA: Diagnosis not present

## 2023-06-13 DIAGNOSIS — R45851 Suicidal ideations: Secondary | ICD-10-CM

## 2023-06-13 DIAGNOSIS — Z91148 Patient's other noncompliance with medication regimen for other reason: Secondary | ICD-10-CM | POA: Insufficient documentation

## 2023-06-13 LAB — POCT URINE DRUG SCREEN - MANUAL ENTRY (I-SCREEN)
POC Amphetamine UR: NOT DETECTED
POC Buprenorphine (BUP): NOT DETECTED
POC Cocaine UR: NOT DETECTED
POC Marijuana UR: POSITIVE — AB
POC Methadone UR: NOT DETECTED
POC Methamphetamine UR: NOT DETECTED
POC Morphine: NOT DETECTED
POC Oxazepam (BZO): NOT DETECTED
POC Oxycodone UR: NOT DETECTED
POC Secobarbital (BAR): NOT DETECTED

## 2023-06-13 LAB — URINALYSIS, ROUTINE W REFLEX MICROSCOPIC
Bilirubin Urine: NEGATIVE
Glucose, UA: NEGATIVE mg/dL
Hgb urine dipstick: NEGATIVE
Ketones, ur: NEGATIVE mg/dL
Leukocytes,Ua: NEGATIVE
Nitrite: NEGATIVE
Protein, ur: NEGATIVE mg/dL
Specific Gravity, Urine: 1.026 (ref 1.005–1.030)
pH: 5 (ref 5.0–8.0)

## 2023-06-13 LAB — COMPREHENSIVE METABOLIC PANEL
ALT: 17 U/L (ref 0–44)
AST: 21 U/L (ref 15–41)
Albumin: 4.5 g/dL (ref 3.5–5.0)
Alkaline Phosphatase: 55 U/L (ref 38–126)
Anion gap: 11 (ref 5–15)
BUN: 13 mg/dL (ref 6–20)
CO2: 28 mmol/L (ref 22–32)
Calcium: 9.9 mg/dL (ref 8.9–10.3)
Chloride: 99 mmol/L (ref 98–111)
Creatinine, Ser: 1.06 mg/dL (ref 0.61–1.24)
GFR, Estimated: >60 mL/min (ref >60)
Glucose, Bld: 85 mg/dL (ref 70–99)
Potassium: 4.5 mmol/L (ref 3.5–5.1)
Sodium: 138 mmol/L (ref 135–145)
Total Bilirubin: 0.5 mg/dL (ref 0.3–1.2)
Total Protein: 8.4 g/dL — ABNORMAL HIGH (ref 6.5–8.1)

## 2023-06-13 LAB — CBC WITH DIFFERENTIAL/PLATELET
Abs Immature Granulocytes: 0.01 10*3/uL (ref 0.00–0.07)
Basophils Absolute: 0 10*3/uL (ref 0.0–0.1)
Basophils Relative: 1 %
Eosinophils Absolute: 0.2 10*3/uL (ref 0.0–0.5)
Eosinophils Relative: 2 %
HCT: 45 % (ref 39.0–52.0)
Hemoglobin: 14.6 g/dL (ref 13.0–17.0)
Immature Granulocytes: 0 %
Lymphocytes Relative: 43 %
Lymphs Abs: 3.4 10*3/uL (ref 0.7–4.0)
MCH: 30.2 pg (ref 26.0–34.0)
MCHC: 32.4 g/dL (ref 30.0–36.0)
MCV: 93 fL (ref 80.0–100.0)
Monocytes Absolute: 0.5 10*3/uL (ref 0.1–1.0)
Monocytes Relative: 6 %
Neutro Abs: 3.7 10*3/uL (ref 1.7–7.7)
Neutrophils Relative %: 48 %
Platelets: 249 10*3/uL (ref 150–400)
RBC: 4.84 MIL/uL (ref 4.22–5.81)
RDW: 15 % (ref 11.5–15.5)
WBC: 7.8 10*3/uL (ref 4.0–10.5)
nRBC: 0 % (ref 0.0–0.2)

## 2023-06-13 LAB — HEMOGLOBIN A1C
Hgb A1c MFr Bld: 5.9 % — ABNORMAL HIGH (ref 4.8–5.6)
Mean Plasma Glucose: 122.63 mg/dL

## 2023-06-13 LAB — MAGNESIUM: Magnesium: 2.3 mg/dL (ref 1.7–2.4)

## 2023-06-13 LAB — ETHANOL: Alcohol, Ethyl (B): <10 mg/dL (ref <10)

## 2023-06-13 LAB — TSH: TSH: 3.021 u[IU]/mL (ref 0.350–4.500)

## 2023-06-13 LAB — LIPID PANEL
Cholesterol: 204 mg/dL — ABNORMAL HIGH (ref 0–200)
HDL: 55 mg/dL (ref >40)
LDL Cholesterol: 121 mg/dL — ABNORMAL HIGH (ref 0–99)
Total CHOL/HDL Ratio: 3.7 RATIO
Triglycerides: 138 mg/dL (ref <150)
VLDL: 28 mg/dL (ref 0–40)

## 2023-06-13 MED ORDER — ARIPIPRAZOLE ER 400 MG IM SRER
400.0000 mg | INTRAMUSCULAR | Status: DC
  Administered 2023-06-13: 400 mg via INTRAMUSCULAR
  Filled 2023-06-13: qty 2

## 2023-06-13 MED ORDER — ACETAMINOPHEN 325 MG PO TABS
650.0000 mg | ORAL_TABLET | Freq: Four times a day (QID) | ORAL | Status: DC | PRN
  Administered 2023-06-13: 650 mg via ORAL
  Filled 2023-06-13 (×2): qty 2

## 2023-06-13 MED ORDER — ARIPIPRAZOLE 10 MG PO TABS
10.0000 mg | ORAL_TABLET | Freq: Once | ORAL | Status: AC
  Administered 2023-06-13: 10 mg via ORAL
  Filled 2023-06-13: qty 1

## 2023-06-13 MED ORDER — ARIPIPRAZOLE ER 400 MG IM SRER: 400.0000 mg | INTRAMUSCULAR | 0 refills | Status: DC

## 2023-06-13 MED ORDER — HYDROXYZINE HCL 25 MG PO TABS
25.0000 mg | ORAL_TABLET | Freq: Three times a day (TID) | ORAL | Status: DC | PRN
  Filled 2023-06-13: qty 1

## 2023-06-13 MED ORDER — MAGNESIUM HYDROXIDE 400 MG/5ML PO SUSP: 30.0000 mL | Freq: Every day | ORAL | Status: DC | PRN

## 2023-06-13 MED ORDER — ALUM & MAG HYDROXIDE-SIMETH 200-200-20 MG/5ML PO SUSP: 30.0000 mL | ORAL | Status: DC | PRN

## 2023-06-13 MED ORDER — TRAZODONE HCL 50 MG PO TABS: 50.0000 mg | ORAL_TABLET | Freq: Every evening | ORAL | Status: DC | PRN

## 2023-06-13 MED ORDER — ARIPIPRAZOLE ER 400 MG IM SRER: 400.0000 mg | INTRAMUSCULAR | Status: DC
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
## 2023-06-13 NOTE — Progress Notes (Signed)
   06/13/23 1750  [place] Suicide Severity Rating Scale  1. Wish to be Dead Yes  2. Suicidal Thoughts Yes  3. Suicidal Thoughts with Method Without Specific Plan or Intent to Act Yes  4. Suicidal Intent Without Specific Plan Yes  5. Suicide Intent with Specific Plan No  6. Suicide Behavior Question Yes  7. How long ago did you do any of these? Within the last three months  C-SSRS RISK CATEGORY High Risk  Patient location: [facility]/Facility Based Crisis Center  [facility] Suicide Precautions Interventions  [facility]/FBC Suicide Precautions Interventions High Risk Interventions
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
## 2023-06-13 NOTE — ED Notes (Addendum)
Pt resting at this hour. No apparent distress. RR even and unlabored. Monitored for safety.  

## 2023-06-13 NOTE — ED Notes (Signed)
Patient A&Ox4. Patient reports passive SI. Patient denies Hi and AVH.  Patient denies any physical complaints when asked. No acute distress noted. Support and encouragement provided. Routine safety checks conducted according to facility protocol. Encouraged patient to notify staff if thoughts of harm toward self or others arise. Patient verbalize understanding and agreement. Will continue to monitor for safety. Meal given and medication.

## 2023-06-13 NOTE — BH Assessment (Signed)
Comprehensive Clinical Assessment (CCA) Note  06/13/2023 Kenneth Welch. [id number]  DISPOSITION: Per Assunta Found NP pt is recommended for overnight observation in the [facility] OBS unit.   The patient demonstrates the following risk factors for suicide: Chronic risk factors for suicide include: psychiatric disorder of schizophrenia . Acute risk factors for suicide include: family or marital conflict, social withdrawal/isolation, and loss (financial, interpersonal, professional). Protective factors for this patient include: hope for the future. Considering these factors, the overall suicide risk at this point appears to be high. Patient is appropriate for outpatient follow up.    Pt is a 37 yo male who presented voluntarily via [employer] after he called them asking for help to get a psychiatric evaluation. Pt denied current SI, HI, NSSH, AVH, paranoia and all substance use except for "sniffing" cannabis (not smoking). Pt reported that he made a suicide attempt on 06/10/23 and went to the [facility] but stated he did not disclose his attempt to the staff. When being discharged he stated he "trashed" some computers in the ED so he would be arrested to go to jail instead of being homeless. Pt stated that the attempt on 06/10/23 was his first attempt. Pt stated he took multiple muscle relaxers with 5-6 Cogentin pills hoping to die. Pt stated that he is upset and suicidal because his sister has taken over his childhood home left to them both by his parents after their death. Pt stated that he was living on his own when they inherited the house and has since been evicted twice and stated he has been mostly homeless since April 2024. He stated he went to the home and went into his childhood bedroom and sister called the police. Pt stated he has been "banned" from going back to the home. Pt stated that he is upset over being homeless. Pt stated that he was evicted due ro "a commotion" that he and friends made at his last  apartment. Pt stated that all other parties were to blame (his sister and his friends) for all his difficulties. Pt has been diagnosed with schizophrenia during an IP psychiatric hospitalization in 2004 at [facility]. Pt stated that he is not sure about the diagnosis and wants to get another evaluation to clarify and substantiate. Pt stated that when he called police today he told them he wanted to attack a police officer so he could be killed. Pt has been seen in the ED (either [facility] or [facility]) 5/7, 6/2, 6/4, 6/28 and 06/10/23 all with similar presentations complaining of SI. Possibly seeking secondary gain of housing due to homelessness.   Pt stated he is receiving disability income related to his schizophrenia diagnosis. Pt reported pending charged related to his property damage at [facility] on 06/10/23. Pt denied access to guns/weapons. Pt stated that he is single and has no children.   Pt was calm and cooperative with pressured speech and restlessness and jitteriness. Pt was alert and oriented. Pt was casually and neatly dressed. Pt seemed in a depressed, anxious mood and had a flat affect. Pt's judgment and insight seem impaired or poor.    Chief Complaint: Suicidal  Visit Diagnosis:  Schizophrenia    CCA Screening, Triage and Referral (STR)  Patient Reported Information How did you hear about [place]? Legal System  What Is the Reason for Your Visit/Call Today? Pt is a 37 yo male who presented voluntarily via [employer] after he called them asking for help to get a psychiatric evaluation. Pt denied current SI,  HI, NSSH, AVH, paranoia and all substance use except for "sniffing" cannabis (not smoking). Pt reported that he made a suicide attempt on 06/10/23 and went to the [facility] but stated he did not disclose his attempt to the staff. When being discharged he stated he "trashed" some computers in the ED so he would be arrested to go to jail instead of being homeless. Pt stated that the attempt on 06/10/23 was his first attempt.  Pt stated he took multiple muscle relaxers with 5-6 Cogentin pills hoping to die. Pt stated that he is upset and suicidal because his sister has taken over his childhood home left to them both by his parents after their death. Pt stated that he was living on his own when they inherited the house and has since been evicted twice and stated he has been mostly homeless since April 2024. He stated he went to the home and went into his childhood bedroom and sister called the police. Pt stated he has been "banned" from going back to the home. Pt stated that he is upset over being homeless. Pt stated that he was evicted due ro "a commotion" that he and friends made at his last apartment. Pt stated that all other parties were to blame (his sister and his friends) for all his difficulties. Pt has been diagnosed with schizophrenia during an IP psychiatric hospitalization in 2004 at [facility]. Pt stated that he is not sure about the diagnosis and wants to get another evaluation to clarify and substantiate. Pt stated that when he called police today he told them he wanted to attack a police officer so he could be killed. Pt has been seen in the ED (either [facility] or [facility]) 5/7, 6/2, 6/4, 6/28 and 06/10/23 all with similar presentations complaining of SI. Possibly seeking secondary gain of housing due to homelessness.  How Long Has This Been Causing You Problems? 1-6 months  What Do You Feel Would Help You the Most Today? Treatment for Depression or other mood problem   Have You Recently Had Any Thoughts About Hurting Yourself? Yes  Are You Planning to Commit Suicide/Harm Yourself At This time? No   Flowsheet Row ED from 06/13/2023 in [facility] Most recent reading at 06/13/2023  5:50 PM ED from 06/10/2023 in [facility] Emergency Department at [facility] Most recent reading at 06/10/2023 11:17 PM ED from 06/10/2023 in [facility] Emergency Department at [facility] Most recent reading  at 06/10/2023  9:56 PM  C-SSRS RISK CATEGORY High Risk Error: Q3, 4, or 5 should not be populated when Q2 is No No Risk       Have you Recently Had Thoughts About Hurting Someone Karolee Ohs? No  Are You Planning to Harm Someone at This Time? No  Explanation: N/A   Have You Used Any Alcohol or Drugs in the Past 24 Hours? No  What Did You Use and How Much? N/A   Do You Currently Have a Therapist/Psychiatrist? No  Name of Therapist/Psychiatrist: Name of Therapist/Psychiatrist: na   Have You Been Recently Discharged From Any Office Practice or Programs? Yes  Explanation of Discharge From Practice/Program: Discharged from [facility] on 06/10/23 (seen in the ED 5 times since 04/11/23)     CCA Screening Triage Referral Assessment Type of Contact: Face-to-Face  Telemedicine Service Delivery:   Is this Initial or Reassessment?   Date Telepsych consult ordered in [facility]:    Time Telepsych consult ordered in [facility]:    Location  of Assessment: [facility] Assessment Services  Provider Location: [facility] Assessment Services   Collateral Involvement: None   Does Patient Have a Automotive engineer Guardian? No  Legal Guardian Contact Information: na  Copy of Legal Guardianship Form: No - copy requested  Legal Guardian Notified of Arrival: -- (na)  Legal Guardian Notified of Pending Discharge: -- (na)  If Minor and Not Living with Parent(s), Who has Custody? adult  Is CPS involved or ever been involved? Never (none reported)  Is APS involved or ever been involved? Never (none reported)   Patient Determined To Be At Risk for Harm To Self or Others Based on Review of Patient Reported Information or Presenting Complaint? No  Method: No Plan  Availability of Means: No access or NA  Intent: Vague intent or NA  Notification Required: No need or identified person  Additional Information for Danger to Others Potential: -- (N/A)  Additional Comments for Danger to Others Potential: na  Are There  Guns or Other Weapons in Your Home? No (denied)  Types of Guns/Weapons: na  Are These Weapons Safely Secured?                            -- (na)  Who Could Verify You Are Able To Have These Secured: na  Do You Have any Outstanding Charges, Pending Court Dates, Parole/Probation? Pt reported pending charged related to his property damage at [facility] on 06/10/23.  Contacted To Inform of Risk of Harm To Self or Others: -- (na)    Does Patient Present under Involuntary Commitment? No    [place] of Residence: [place]   Patient Currently Receiving the Following Services: Not Receiving Services   Determination of Need: Urgent (48 hours) (Per Shuvon Rankin NP pt is recommended for overnight observation in the [facility] OBS unit.)   Options For Referral: [facility] Urgent Care (OBS unit)     CCA Biopsychosocial Patient Reported Schizophrenia/Schizoaffective Diagnosis in Past: Yes (Pt stated he is receiving disability income related to his schizophrenia diagnosis.)   Strengths: asking for help   Mental Health Symptoms Depression:   None   Duration of Depressive symptoms:  Duration of Depressive Symptoms: N/A   Mania:   None   Anxiety:    None   Psychosis:   None   Duration of Psychotic symptoms:    Trauma:   None   Obsessions:   None   Compulsions:   None   Inattention:   None   Hyperactivity/Impulsivity:   None   Oppositional/Defiant Behaviors:   None   Emotional Irregularity:   None   Other Mood/Personality Symptoms:   N/A    Mental Status Exam Appearance and self-care  Stature:   Tall   Weight:   Average weight   Clothing:   Casual (Scrubs)   Grooming:   Normal   Cosmetic use:   None   Posture/gait:   Normal   Motor activity:   Not Remarkable   Sensorium  Attention:   Normal   Concentration:   Normal   Orientation:   X5   Recall/memory:   Normal   Affect and Mood  Affect:   Flat   Mood:   Other (Comment); Negative;  Pessimistic; Dysphoric (Calm)   Relating  Eye contact:   Normal   Facial expression:   Constricted; Anxious   Attitude toward examiner:   Cooperative   Thought and Language  Speech flow:  Normal  Thought content:   Appropriate to Mood and Circumstances   Preoccupation:   None   Hallucinations:   None   Organization:   Intact   [employer] of Knowledge:   Average   Intelligence:   Average   Abstraction:   Normal   Judgement:   Poor   Reality Testing:   Adequate   Insight:   Poor; Gaps   Decision Making:   Impulsive   Social Functioning  Social Maturity:   Impulsive   Social Judgement:   Heedless; "Street Smart"   Stress  Stressors:   Housing; Other (Comment) Teaching laboratory technician)   Coping Ability:   Overwhelmed; Exhausted   Skill Deficits:   None   Supports:   Support needed     Religion: Religion/Spirituality Are You A Religious Person?: Yes How Might This Affect Treatment?: N/A  Leisure/Recreation: Leisure / Recreation Do You Have Hobbies?: No  Exercise/Diet: Exercise/Diet Do You Exercise?: No Have You Gained or Lost A Significant Amount of Weight in the Past Six Months?: No Do You Follow a Special Diet?: No Do You Have Any Trouble Sleeping?: Yes Explanation of Sleeping Difficulties: Patient reports getting poor sleep recently   CCA Employment/Education Employment/Work Situation: Employment / Work Situation Employment Situation: On disability Why is Patient on Disability: Due to schizophrenia diagnosis How Long has Patient Been on Disability: Since 2013 Patient's Job has Been Impacted by Current Illness: No Has Patient ever Been in the [employer]?: No  Education: Education Is Patient Currently Attending School?: No Last Grade Completed: 12 (some college) Did Theme park manager?: Yes What Type of College Degree Do you Have?: no degree reported Did You Have An Individualized Education Program (IIEP):  No Did You Have Any Difficulty At School?: No   CCA Family/Childhood History Family and Relationship History: Family history Marital status: Single Does patient have children?: No  Childhood History:  Childhood History By whom was/is the patient raised?: Both parents (Pt stated both his parents are deceased now.) Did patient suffer any verbal/emotional/physical/sexual abuse as a child?: No Has patient ever been sexually abused/assaulted/raped as an adolescent or adult?: No Witnessed domestic violence?: No Has patient been affected by domestic violence as an adult?: No       CCA Substance Use Alcohol/Drug Use: Alcohol / Drug Use Pain Medications: See MAR Prescriptions: See MAR Over the Counter: See MAR History of alcohol / drug use?: Yes Longest period of sobriety (when/how long): Unknown Negative Consequences of Use:  (N/A) Withdrawal Symptoms: None Substance #1 Name of Substance 1: cannabis 1 - Age of First Use: unknonwn 1 - Amount (size/oz): unknown 1 - Frequency: "when I can" 1 - Duration: ongoing 1 - Last Use / Amount: unknown 1 - Method of Aquiring: unknown 1- Route of Use: "sniffing" fumes, not smoking per pt                       ASAM's:  Six Dimensions of Multidimensional Assessment  Dimension 1:  Acute Intoxication and/or Withdrawal Potential:   Dimension 1:  Description of individual's past and current experiences of substance use and withdrawal: Patient denies withdrawal symptoms  Dimension 2:  Biomedical Conditions and Complications:   Dimension 2:  Description of patient's biomedical conditions and  complications: Patient has several medical conditions.  Dimension 3:  Emotional, Behavioral, or Cognitive Conditions and Complications:  Dimension 3:  Description of emotional, behavioral, or cognitive conditions and complications: Patient reports previous SI/HI; Schizophrenia  Dimension 4:  Readiness to Change:  Dimension 4:  Description of  Readiness to Change criteria: Patient does not acknowledge a need for change  Dimension 5:  Relapse, Continued use, or Continued Problem Potential:  Dimension 5:  Relapse, continued use, or continued problem potential critiera description: Patient is lacking insight  Dimension 6:  Recovery/Living Environment:  Dimension 6:  Recovery/Iiving environment criteria description: Patient reports being homeless  ASAM Severity Score: ASAM's Severity Rating Score: 11  ASAM Recommended Level of Treatment: ASAM Recommended Level of Treatment: Level II Intensive Outpatient Treatment   Substance use Disorder (SUD) Substance Use Disorder (SUD)  Checklist Symptoms of Substance Use: Continued use despite persistent or recurrent social, interpersonal problems, caused or exacerbated by use, Presence of craving or strong urge to use, Continued use despite having a persistent/recurrent physical/psychological problem caused/exacerbated by use  Recommendations for Services/Supports/Treatments: Recommendations for Services/Supports/Treatments Recommendations For Services/Supports/Treatments: Individual Therapy  Discharge Disposition:    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     Referrals to Alternative Service(s): Referred to Alternative Service(s):   Place:   Date:   Time:    Referred to Alternative Service(s):   Place:   Date:   Time:    Referred to Alternative Service(s):   Place:   Date:   Time:    Referred to Alternative Service(s):   Place:   Date:   Time:     Katelyn Kohlmeyer T,  Counselor

## 2023-06-14 ENCOUNTER — Encounter (HOSPITAL_COMMUNITY): Payer: Self-pay | Admitting: Registered Nurse

## 2023-06-14 NOTE — ED Notes (Signed)
Pt asleep at this hour. No apparent distress. RR even and unlabored. Monitored for safety.  

## 2023-06-14 NOTE — ED Notes (Signed)
Pt resting at this hour. No apparent distress. RR even and unlabored. Monitored for safety.  

## 2023-06-14 NOTE — Discharge Instructions (Addendum)
If you need to be seen prior to your scheduled appointment for shot clinic please come during walk in hours listed below  [facility]: Outpatient psychiatric Services:   Please see the walk in hours listed below.  Medication Management New Patient needing Medication Management Walk-in, and Existing Patients needing to see a provider for management coming as a walk in   Monday thru Friday 8:00 AM first come first serve until slots are full.  Recommend being there by 7:15 AM to ensure a slot is open.  Therapy New Patient Therapy Intake and Existing Patients needing to see therapist coming in as a walk in.   Monday, Wednesday, and Thursday morning at 8:00 am first come first serve.  Recommend being there by 7:15 AM to ensure a slot is open.    Every 1st, 2nd, and 3rd Friday at 1:00 PM first come first serve until slots are full.  Will still need to come in that morning at 7:15 AM to get registered for an afternoon slot.  For all walk-ins we ask that you arrive by 7:15 am because patients will be seen in there order of arrival (FIRST COME FIRST SERVE) Availability is limited, therefore you may not be seen on the same day that you walk in if all slots are full.    Our goal is to serve and meet the needs of our community to the best of our ability.    Based on the information that you have provided and the presenting issues outpatient services and resources for have been recommended.  It is imperative that you follow through with treatment recommendations within 5-7 days from the of discharge to mitigate further risk to your safety and mental well-being. A list of referrals has been provided below to get you started.  You are not limited to the list provided.  In case of an urgent crisis, you may contact the Mobile Crisis Unit with [facility] at [phone].   [facility]  [facility] [address] [place], [place], [address] [phone]  phone  Offers food and emergency or transitional housing to men, women, or families in need. Clients participate in programs and workshops developed to promote self-sufficiency and personal development.Call or walk in. Applications are accepted Monday, Wednesday, and Friday by appointment only. Need photo ID and proof of income.  [facility] [address], [place], [place] [address] [phone] Population served: Adult men & women (55 years old and older, able to perform activities for daily living) Documents required: Valid ID & Social Security Card  [facility] [address] [place], [place]  [address] [phone] Population served: Families with children  [facility] [address] [place], [place]  [address] [phone] Population served: Single women 18+ without dependents Documents required:  Valid ID & Social Security Card  [facility] [address], [place], [place]  [address] [phone] Population served: Male veterans 18+ with substance abuse/mental health issues Eligibility: By referral only  [facility] [address] [place], [place] [address] [phone] Population served: Males 18+ Documents required: Valid ID & Social Security Card  [facility] at [employer] [facility] [place] [employer] [address] [place] [address] [phone] or [phone] Population served: Pregnant women with or without children  Documents required: Valid ID & Social Security Ship broker of [employer]  [address] [place], [place] [address] [phone] Population Served: Families with children  [facility] [address] [place], [place] [address] [phone] Population served: Men 18+, preference for disabled and/or veterans Eligibility: By referral only  [facility]) - Emergency  [facility] [address] [place], [place], [place] [address] [phone] or [phone] Population served: Families with children.     Patient has a follow up appointment in the shot clinic on 07-04-2023 at [facility] 07-11-2023 at 11:30am     [facility] [address] [place], [place], [address] [phone] phone   [facility] Address: [address] [place], [place], [place] [address] Phone: [phone]  Supported Employment The supported employment program is a person-centered, individualized, evidence-based support service that helps members choose, acquire, and maintain competitive employment in our community. This service supports the varying needs of individuals and promotes community inclusion and employment success. Members enrolled in the supported employment program can expect the following:  Development of an individual career plan Community based job placement Job shadowing Job development On-site job Furniture conservator/restorer and support  Supported Education Supported education helps our members receive the education and training they need to achieve their learning and recovery goals. This will assist members with becoming gainfully employed in the job or career of their choice. The program includes assistance with: Registering for disability accommodations Enrolling in school and registering for classes Learning communication skills Scheduling tutoring sessions within your school [facility] partners with Vocational Rehabilitation to help increase the success of clients seeking employment and educational goals.  Want to learn more about our programs?   Please contact our intake department INTAKE: [phone] Ext [phone]  Mailing: [address] [address]   [place], [place] [address]   [email or web address]

## 2023-06-14 NOTE — ED Notes (Signed)
Patient remains asleep in bed without issue or complaint.  Will monitor.   
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
## 2023-06-15 LAB — URINE CYTOLOGY ANCILLARY ONLY
Comment: NEGATIVE
Trichomonas: NEGATIVE

## 2023-06-15 LAB — PROLACTIN: Prolactin: 19.4 ng/mL (ref 3.9–22.7)

## 2023-06-17 ENCOUNTER — Emergency Department (HOSPITAL_COMMUNITY)
Admission: EM | Admit: 2023-06-17 | Discharge: 2023-06-17 | Discharge status: Against Medical Advice | Payer: MEDICAID | Attending: Emergency Medicine | Admitting: Emergency Medicine

## 2023-06-17 ENCOUNTER — Encounter (HOSPITAL_COMMUNITY): Payer: Self-pay

## 2023-06-17 ENCOUNTER — Other Ambulatory Visit: Payer: Self-pay

## 2023-06-17 DIAGNOSIS — Y909 Presence of alcohol in blood, level not specified: Secondary | ICD-10-CM | POA: Insufficient documentation

## 2023-06-17 DIAGNOSIS — F10120 Alcohol abuse with intoxication, uncomplicated: Secondary | ICD-10-CM | POA: Diagnosis not present

## 2023-06-17 DIAGNOSIS — Z5321 Procedure and treatment not carried out due to patient leaving prior to being seen by health care provider: Secondary | ICD-10-CM | POA: Insufficient documentation

## 2023-06-17 NOTE — ED Triage Notes (Signed)
Patient BIB EMS with complaint of intoxication, patient reports that he has not been eating or drinking for several hours. While walking home felt weak and call ems to bring him to hospital.

## 2023-06-17 NOTE — ED Notes (Signed)
Pt attempting to leave and asking for bus pass. Pt educated that he must be seen and discharged to get a bus pass, and has agreed to stay at this time

## 2023-06-23 ENCOUNTER — Ambulatory Visit (HOSPITAL_COMMUNITY)
Admission: EM | Admit: 2023-06-23 | Discharge: 2023-06-23 | Disposition: A | Discharge status: Home / Self Care | Payer: MEDICAID | Attending: Behavioral Health | Admitting: Behavioral Health

## 2023-06-23 DIAGNOSIS — F259 Schizoaffective disorder, unspecified: Secondary | ICD-10-CM | POA: Insufficient documentation

## 2023-06-23 DIAGNOSIS — Z008 Encounter for other general examination: Secondary | ICD-10-CM | POA: Insufficient documentation

## 2023-06-23 DIAGNOSIS — Z7689 Persons encountering health services in other specified circumstances: Secondary | ICD-10-CM

## 2023-06-23 NOTE — Discharge Instructions (Addendum)
Discharge recommendations:   Medications: Patient is to take medications as prescribed. The patient or patient's guardian is to contact a medical professional and/or outpatient provider to address any new side effects that develop. The patient or the patient's guardian should update outpatient providers of any new medications and/or medication changes.   Outpatient Follow up: [facility] follow [facility] at the [facility]: Your next appointment is scheduled for July 04, 2023 at 9:00 am for medication management. Your next Abilify injection is scheduled for July 11, 2023 at 11:30 am.   Atypical antipsychotics: If you are prescribed an atypical antipsychotic, it is recommended that your height, weight, BMI, blood pressure, fasting lipid panel, and fasting blood sugar be monitored by your outpatient providers.  Safety:   The following safety precautions should be taken:   No sharp objects. This includes scissors, razors, scrapers, and putty knives.   Chemicals should be removed and locked up.   Medications should be removed and locked up.   Weapons should be removed and locked up. This includes firearms, knives and instruments that can be used to cause injury.   The patient should abstain from use of illicit substances/drugs and abuse of any medications.  If symptoms worsen or do not continue to improve or if the patient becomes actively suicidal or homicidal then it is recommended that the patient return to the closest hospital emergency department, the [facility], or call 911 for further evaluation and treatment. National Suicide Prevention Lifeline 1-800-SUICIDE or 801-055-4819.  About 988 988 offers 24/7 access to trained crisis counselors who can help people experiencing mental health-related distress. People can call or text 988 or chat 988lifeline.org for themselves or if they are worried about a loved one who may need crisis support.

## 2023-06-23 NOTE — Progress Notes (Signed)
   06/23/23 1114  [facility] Triage Screening (Walk-ins at [facility] only)  How Did You Hear About [place]? Self  What Is the Reason for Your Visit/Call Today? Patient is a 37 year old that presents this date as a voluntary walk in reporting he was seen last week here and is returning this date to, "get the results of his tests." Patient states he was tested, "for Schizophrenia."  Patient denies any S/I, H/I or AVH. Patient is just requesting to speak to a provider.  How Long Has This Been Causing You Problems? 1 wk - 1 month  Have You Recently Had Any Thoughts About Hurting Yourself? No  How long ago did you have thoughts about hurting yourself? NA  Are You Planning to Commit Suicide/Harm Yourself At This time? No  Have you Recently Had Thoughts About Hurting Someone Karolee Ohs? No  Are You Planning To Harm Someone At This Time? No  Are you currently experiencing any auditory, visual or other hallucinations? No  Have You Used Any Alcohol or Drugs in the Past 24 Hours? No  Do you have any current medical co-morbidities that require immediate attention? No  Clinician description of patient physical appearance/behavior: Patient presents with a pleasant affect  What Do You Feel Would Help You the Most Today?  (Pt is just requesting to speak to a provider in reference to his last visit here.)  If access to [facility] was not available, would you have sought care in the Emergency Department? No  Determination of Need Routine (7 days)  Options For Referral  (To be determined)

## 2023-06-23 NOTE — ED Provider Notes (Signed)
Behavioral Health Urgent Care Medical Screening Exam  Patient Name: Kenneth Welch. MRN: [id number] Date of Evaluation: 06/23/23 Chief Complaint:  "get the test results for schizophrenia."  Diagnosis:  Final diagnoses:  Encounter for psychiatric assessment    History of Present illness: Kenneth Welch. is a 37 y.o. male with a past psychiatric history significant for schizoaffective disorder who presents to the [facility] requesting the test results for schizophrenia  Patient seen and evaluated face to face by this provider and chart reviewed. Per chart review, patient was evaluated here at the [facility] on 06/13/23 with c/o medication noncompliance and requesting a psychiatric evaluation. He was administered Abilify 400 mg IM on 06/13/23 and recommended to f/u with outpatient psychiatry for medication management.   On evaluation, patient is alert and oriented x 4. His thought process is linear and speech is coherent. His mood is euthymic and affect is congruent. He has fair eye contact. He is calm and cooperative and does not appear to be in acute distress. He denies SI/HI/AVH. There is no objective evidence that the patient is currently responding to internal or external stimuli. He denies recent mood changes. He reports fair sleep. He reports a fair appetite. He denies drinking alcohol or using illicit drugs. UDS on 06/12/20 positive for THC. He denies physical complaints. He denies mediation side effects.   Patient states that he would like to find out the results for his schizophrenia. No recent abnormal labs noted in the patient's EMR other than patient tested positive for Gonorrhea on 06/10/23 and was treated at [facility]. I discussed with the patient that his EMR indicates that he has a history of schizoaffective disorder. I discussed with the patient that he was not given a new diagnosis on his last encounter here at the [facility] on 06/13/23. Patient was advised to discuss mental health history  at his next follow up appointment here at the [facility] on 07/04/23 with the psychiatrist. Patient verbalizes understanding. No further concerns or questions voiced by the patient. Patient safety discharged.   Flowsheet Row ED from 06/23/2023 in [facility] ED from 06/17/2023 in [facility] Emergency Department at [facility] ED from 06/13/2023 in [facility]  C-SSRS RISK CATEGORY No Risk No Risk High Risk       Psychiatric Specialty Exam  Presentation  General Appearance:Appropriate for Environment  Eye Contact:Fair  Speech:Clear and Coherent  Speech Volume:Normal  Handedness:Right   Mood and Affect  Mood: Euthymic  Affect: Congruent   Thought Process  Thought Processes: Coherent  Descriptions of Associations:Intact  Orientation:Full (Time, Place and Person)  Thought Content:Logical  Diagnosis of Schizophrenia or Schizoaffective disorder in past: Yes  Duration of Psychotic Symptoms: Greater than six months  Hallucinations:None  Ideas of Reference:None  Suicidal Thoughts:No  Homicidal Thoughts:No   Sensorium  Memory: Immediate Fair; Recent Fair; Remote Fair  Judgment: Fair  Insight: Fair   Art therapist  Concentration: Fair  Attention Span: Fair  Recall: [employer] of Knowledge: Fair  Language: Fair   Psychomotor Activity  Psychomotor Activity: Normal   Assets  Assets: Manufacturing systems engineer; Desire for Improvement; Leisure Time; Physical Health; Housing; Social Support   Sleep  Sleep: Fair  Number of hours:  8   Physical Exam: Physical Exam HENT:     Head: Normocephalic.     Nose: Nose normal.  Eyes:     Conjunctiva/sclera: Conjunctivae normal.  Cardiovascular:     Rate and Rhythm: Normal  rate.  Pulmonary:     Effort: Pulmonary effort is normal.  Musculoskeletal:        General: Normal range of motion.     Cervical back: Normal range of motion.   Neurological:     Mental Status: He is alert and oriented to person, place, and time.    Review of Systems  Constitutional: Negative.   HENT: Negative.    Eyes: Negative.   Respiratory: Negative.    Cardiovascular: Negative.   Gastrointestinal: Negative.   Genitourinary: Negative.   Musculoskeletal: Negative.   Endo/Heme/Allergies: Negative.    Blood pressure 116/72, pulse 79, temperature 98 F (36.7 C), temperature source Oral, resp. rate 18, SpO2 100%. There is no height or weight on file to calculate BMI.  Musculoskeletal: Strength & Muscle Tone: within normal limits Gait & Station: normal Patient leans: N/A   [facility] MSE Discharge Disposition for Follow up and Recommendations: Based on my evaluation the patient does not appear to have an emergency medical condition and can be discharged with resources and follow up care in outpatient services for Medication Management, Individual Therapy, and Group Therapy  Discharge recommendations:   Medications: Patient is to take medications as prescribed. The patient or patient's guardian is to contact a medical professional and/or outpatient provider to address any new side effects that develop. The patient or the patient's guardian should update outpatient providers of any new medications and/or medication changes.   Outpatient Follow up: Please follow up at the [facility]: Your next appointment is scheduled for July 04, 2023 at 9:00 am for medication management. Your next Abilify injection is scheduled for July 11, 2023 at 11:30 am.   Atypical antipsychotics: If you are prescribed an atypical antipsychotic, it is recommended that your height, weight, BMI, blood pressure, fasting lipid panel, and fasting blood sugar be monitored by your outpatient providers.  Safety:   The following safety precautions should be taken:   No sharp objects. This includes scissors, razors, scrapers, and putty knives.    Chemicals should be removed and locked up.   Medications should be removed and locked up.   Weapons should be removed and locked up. This includes firearms, knives and instruments that can be used to cause injury.   The patient should abstain from use of illicit substances/drugs and abuse of any medications.  If symptoms worsen or do not continue to improve or if the patient becomes actively suicidal or homicidal then it is recommended that the patient return to the closest hospital emergency department, the [facility], or call 911 for further evaluation and treatment. National Suicide Prevention Lifeline 1-800-SUICIDE or 954 154 1685.  About 988 988 offers 24/7 access to trained crisis counselors who can help people experiencing mental health-related distress. People can call or text 988 or chat 988lifeline.org for themselves or if they are worried about a loved one who may need crisis support.   Abu Heavin L, NP 06/23/2023, 11:59 AM

## 2023-07-04 ENCOUNTER — Ambulatory Visit (HOSPITAL_COMMUNITY): Payer: Medicaid Other

## 2023-07-11 ENCOUNTER — Ambulatory Visit (HOSPITAL_COMMUNITY): Payer: MEDICAID

## 2023-09-24 ENCOUNTER — Emergency Department (HOSPITAL_COMMUNITY)
Admission: EM | Admit: 2023-09-24 | Discharge: 2023-09-24 | Discharge status: Against Medical Advice | Payer: MEDICAID | Attending: Emergency Medicine | Admitting: Emergency Medicine

## 2023-09-24 DIAGNOSIS — Z5321 Procedure and treatment not carried out due to patient leaving prior to being seen by health care provider: Secondary | ICD-10-CM | POA: Diagnosis not present

## 2023-09-24 DIAGNOSIS — T43651A Poisoning by methamphetamines accidental (unintentional), initial encounter: Secondary | ICD-10-CM | POA: Insufficient documentation

## 2023-09-24 DIAGNOSIS — R451 Restlessness and agitation: Secondary | ICD-10-CM | POA: Insufficient documentation

## 2023-09-24 NOTE — ED Provider Triage Note (Signed)
Emergency Medicine Provider Triage Evaluation Note  Kenneth Welch , a 37 y.o. male  was evaluated in triage.  Pt complains of using meth as a -preworkout.Marland Kitchen agitated c/o overdose  Review of Systems  Positive: agitation Negative: fever  Physical Exam  BP (!) 144/93   Pulse 92   Temp 99.3 F (37.4 C) (Oral)   Resp 16   SpO2 99%  Gen:   Awake, no distress   Resp:  Normal effort  MSK:   Moves extremities without difficulty  Other:    Medical Decision Making  Medically screening exam initiated at 8:36 PM.  Appropriate orders placed.  Kenneth Welch. was informed that the remainder of the evaluation will be completed by another provider, this initial triage assessment does not replace that evaluation, and the importance of remaining in the ED until their evaluation is complete.     Arthor Captain, PA-C 09/24/23 2037

## 2023-09-24 NOTE — ED Triage Notes (Signed)
Presents from home for overdose.  Used meth for preworkout but now is unsure this is what he took.  Agitated and combative with EMS.  EMS VS140/80, 90bpm, 100%RA, 109CBG, no meds en route

## 2023-09-24 NOTE — ED Notes (Addendum)
Pt became verbally aggressive towards staff despite food and water being provided per patient request. Pt does 10 push ups in room and tells this RN he feels better. Pt then states he is leaving and runs out emergency room doors. He is not IVC and is allowed to leave at this time, ED PA aware

## 2023-10-23 ENCOUNTER — Other Ambulatory Visit: Payer: Self-pay

## 2023-10-23 ENCOUNTER — Emergency Department (HOSPITAL_COMMUNITY)
Admission: EM | Admit: 2023-10-23 | Discharge: 2023-10-23 | Discharge status: Against Medical Advice | Payer: MEDICAID | Attending: Emergency Medicine | Admitting: Emergency Medicine

## 2023-10-23 DIAGNOSIS — F419 Anxiety disorder, unspecified: Secondary | ICD-10-CM | POA: Diagnosis present

## 2023-10-23 DIAGNOSIS — Z5321 Procedure and treatment not carried out due to patient leaving prior to being seen by health care provider: Secondary | ICD-10-CM | POA: Insufficient documentation

## 2023-10-23 LAB — COMPREHENSIVE METABOLIC PANEL
ALT: 16 U/L (ref 0–44)
AST: 29 U/L (ref 15–41)
Albumin: 3.9 g/dL (ref 3.5–5.0)
Alkaline Phosphatase: 51 U/L (ref 38–126)
Anion gap: 10 (ref 5–15)
BUN: 10 mg/dL (ref 6–20)
CO2: 18 mmol/L — ABNORMAL LOW (ref 22–32)
Calcium: 9.1 mg/dL (ref 8.9–10.3)
Chloride: 106 mmol/L (ref 98–111)
Creatinine, Ser: 1.06 mg/dL (ref 0.61–1.24)
GFR, Estimated: >60 mL/min (ref >60)
Glucose, Bld: 87 mg/dL (ref 70–99)
Potassium: 4.4 mmol/L (ref 3.5–5.1)
Sodium: 134 mmol/L — ABNORMAL LOW (ref 135–145)
Total Bilirubin: 0.6 mg/dL (ref <1.2)
Total Protein: 7.6 g/dL (ref 6.5–8.1)

## 2023-10-23 LAB — CBC WITH DIFFERENTIAL/PLATELET
Abs Immature Granulocytes: 0.01 10*3/uL (ref 0.00–0.07)
Basophils Absolute: 0 10*3/uL (ref 0.0–0.1)
Basophils Relative: 1 %
Eosinophils Absolute: 0.3 10*3/uL (ref 0.0–0.5)
Eosinophils Relative: 6 %
HCT: 46.2 % (ref 39.0–52.0)
Hemoglobin: 15.2 g/dL (ref 13.0–17.0)
Immature Granulocytes: 0 %
Lymphocytes Relative: 44 %
Lymphs Abs: 2.2 10*3/uL (ref 0.7–4.0)
MCH: 30.6 pg (ref 26.0–34.0)
MCHC: 32.9 g/dL (ref 30.0–36.0)
MCV: 93.1 fL (ref 80.0–100.0)
Monocytes Absolute: 0.4 10*3/uL (ref 0.1–1.0)
Monocytes Relative: 9 %
Neutro Abs: 1.9 10*3/uL (ref 1.7–7.7)
Neutrophils Relative %: 40 %
Platelets: 225 10*3/uL (ref 150–400)
RBC: 4.96 MIL/uL (ref 4.22–5.81)
RDW: 14.4 % (ref 11.5–15.5)
WBC: 4.8 10*3/uL (ref 4.0–10.5)
nRBC: 0 % (ref 0.0–0.2)

## 2023-10-23 LAB — RAPID URINE DRUG SCREEN, HOSP PERFORMED
Amphetamines: NOT DETECTED
Barbiturates: NOT DETECTED
Benzodiazepines: NOT DETECTED
Cocaine: NOT DETECTED
Opiates: NOT DETECTED
Tetrahydrocannabinol: NOT DETECTED

## 2023-10-23 LAB — ETHANOL: Alcohol, Ethyl (B): <10 mg/dL (ref <10)

## 2023-10-23 NOTE — ED Provider Triage Note (Signed)
Emergency Medicine Provider Triage Evaluation Note  Kenneth Welch , a 37 y.o. male with a history of HIV and schizoaffective disorder was evaluated in triage.  Pt complains of anxiety and difficulty sleeping.  Reports he has not slept in the last 6 or 7 days.  Uses marijuana and methamphetamine recreationally.  Has not been taking any of his medications in several months due to financial barriers.  Presents today for acute anxiety and discussion medication management.  Denies SI HI.  Review of Systems  Positive: Anxiety Negative: Chest pain shortness of breath  Physical Exam  BP 135/84 (BP Location: Left Leg)   Pulse 83   Temp 98.1 F (36.7 C) (Oral)   Resp 18   SpO2 99%  General: Awake. Alert. No acute distress Cardiac: Regular rate rhythm Lungs: Clear to auscultation bilaterally Psych: Calm and cooperative  Medical Decision Making  Medically screening exam initiated at 9:57 AM.  Appropriate orders placed.  Kenneth Welch. was informed that the remainder of the evaluation will be completed by another provider, this initial triage assessment does not replace that evaluation, and the importance of remaining in the ED until their evaluation is complete.     Royanne Foots, DO 10/23/23 [phone]

## 2023-10-23 NOTE — ED Triage Notes (Signed)
Patient arrives with EMS for eval of anxiety d/t difficult life circumstances. Denies SI or HI. Has been prescribed Abilify 10mg  and Cogentin 5mg  in the past but has been unable to get them bc of transportation and financial barriers. Has been smoking marijuana in place of these medications and occasionally uses meth but hasn't used that in one month.

## 2023-10-25 LAB — CD4/CD8 (T-HELPER/T-SUPPRESSOR CELL)
CD4 absolute: 575 /uL (ref 400–1790)
CD4%: 26.2 % — ABNORMAL LOW (ref 33–65)
CD8 T Cell Abs: 753 /uL (ref 190–1000)
CD8tox: 34.3 % (ref 12–40)
Ratio: 0.76 — ABNORMAL LOW (ref 1.0–3.0)
Total lymphocyte count: 2194 /uL (ref 1000–4000)

## 2023-11-25 DIAGNOSIS — Z21 Asymptomatic human immunodeficiency virus [HIV] infection status: Secondary | ICD-10-CM | POA: Insufficient documentation

## 2023-11-25 DIAGNOSIS — T43506A Underdosing of unspecified antipsychotics and neuroleptics, initial encounter: Secondary | ICD-10-CM | POA: Insufficient documentation

## 2023-11-25 DIAGNOSIS — F259 Schizoaffective disorder, unspecified: Secondary | ICD-10-CM | POA: Insufficient documentation

## 2023-11-25 DIAGNOSIS — Z91128 Patient's intentional underdosing of medication regimen for other reason: Secondary | ICD-10-CM | POA: Insufficient documentation

## 2023-11-25 DIAGNOSIS — T375X6A Underdosing of antiviral drugs, initial encounter: Secondary | ICD-10-CM | POA: Insufficient documentation

## 2023-11-26 ENCOUNTER — Other Ambulatory Visit: Payer: Self-pay

## 2023-11-26 ENCOUNTER — Ambulatory Visit (HOSPITAL_COMMUNITY)
Admission: EM | Admit: 2023-11-26 | Discharge: 2023-11-27 | Disposition: A | Discharge status: Other Acute Inpatient Hospital | Payer: MEDICAID | Attending: Psychiatry | Admitting: Psychiatry

## 2023-11-26 DIAGNOSIS — F259 Schizoaffective disorder, unspecified: Secondary | ICD-10-CM

## 2023-11-26 DIAGNOSIS — R45851 Suicidal ideations: Secondary | ICD-10-CM

## 2023-11-26 DIAGNOSIS — T43506A Underdosing of unspecified antipsychotics and neuroleptics, initial encounter: Secondary | ICD-10-CM | POA: Diagnosis not present

## 2023-11-26 DIAGNOSIS — T375X6A Underdosing of antiviral drugs, initial encounter: Secondary | ICD-10-CM | POA: Diagnosis not present

## 2023-11-26 DIAGNOSIS — Z91128 Patient's intentional underdosing of medication regimen for other reason: Secondary | ICD-10-CM | POA: Diagnosis not present

## 2023-11-26 LAB — CBC WITH DIFFERENTIAL/PLATELET
Abs Immature Granulocytes: 0.01 10*3/uL (ref 0.00–0.07)
Basophils Absolute: 0 10*3/uL (ref 0.0–0.1)
Basophils Relative: 1 %
Eosinophils Absolute: 0.3 10*3/uL (ref 0.0–0.5)
Eosinophils Relative: 5 %
HCT: 38.7 % — ABNORMAL LOW (ref 39.0–52.0)
Hemoglobin: 12.7 g/dL — ABNORMAL LOW (ref 13.0–17.0)
Immature Granulocytes: 0 %
Lymphocytes Relative: 32 %
Lymphs Abs: 2.1 10*3/uL (ref 0.7–4.0)
MCH: 30.5 pg (ref 26.0–34.0)
MCHC: 32.8 g/dL (ref 30.0–36.0)
MCV: 92.8 fL (ref 80.0–100.0)
Monocytes Absolute: 0.4 10*3/uL (ref 0.1–1.0)
Monocytes Relative: 7 %
Neutro Abs: 3.6 10*3/uL (ref 1.7–7.7)
Neutrophils Relative %: 55 %
Platelets: 229 10*3/uL (ref 150–400)
RBC: 4.17 MIL/uL — ABNORMAL LOW (ref 4.22–5.81)
RDW: 14.4 % (ref 11.5–15.5)
WBC: 6.5 10*3/uL (ref 4.0–10.5)
nRBC: 0 % (ref 0.0–0.2)

## 2023-11-26 LAB — COMPREHENSIVE METABOLIC PANEL
ALT: 25 U/L (ref 0–44)
AST: 36 U/L (ref 15–41)
Albumin: 3.6 g/dL (ref 3.5–5.0)
Alkaline Phosphatase: 51 U/L (ref 38–126)
Anion gap: 4 — ABNORMAL LOW (ref 5–15)
BUN: 11 mg/dL (ref 6–20)
CO2: 25 mmol/L (ref 22–32)
Calcium: 9 mg/dL (ref 8.9–10.3)
Chloride: 111 mmol/L (ref 98–111)
Creatinine, Ser: 0.93 mg/dL (ref 0.61–1.24)
GFR, Estimated: >60 mL/min (ref >60)
Glucose, Bld: 86 mg/dL (ref 70–99)
Potassium: 4.2 mmol/L (ref 3.5–5.1)
Sodium: 140 mmol/L (ref 135–145)
Total Bilirubin: 0.5 mg/dL (ref <1.2)
Total Protein: 6.5 g/dL (ref 6.5–8.1)

## 2023-11-26 LAB — POCT URINE DRUG SCREEN - MANUAL ENTRY (I-SCREEN)
POC Amphetamine UR: NOT DETECTED
POC Buprenorphine (BUP): NOT DETECTED
POC Cocaine UR: NOT DETECTED
POC Marijuana UR: NOT DETECTED
POC Methadone UR: NOT DETECTED
POC Methamphetamine UR: NOT DETECTED
POC Morphine: NOT DETECTED
POC Oxazepam (BZO): NOT DETECTED
POC Oxycodone UR: NOT DETECTED
POC Secobarbital (BAR): NOT DETECTED

## 2023-11-26 LAB — LIPID PANEL
Cholesterol: 148 mg/dL (ref 0–200)
HDL: 43 mg/dL (ref >40)
LDL Cholesterol: 92 mg/dL (ref 0–99)
Total CHOL/HDL Ratio: 3.4 {ratio}
Triglycerides: 65 mg/dL (ref <150)
VLDL: 13 mg/dL (ref 0–40)

## 2023-11-26 LAB — ETHANOL: Alcohol, Ethyl (B): <10 mg/dL (ref <10)

## 2023-11-26 MED ORDER — ARIPIPRAZOLE 10 MG PO TABS
10.0000 mg | ORAL_TABLET | Freq: Every day | ORAL | Status: DC
  Administered 2023-11-26 (×2): 10 mg via ORAL
  Filled 2023-11-26 (×2): qty 1

## 2023-11-26 MED ORDER — BICTEGRAVIR-EMTRICITAB-TENOFOV 50-200-25 MG PO TABS
1.0000 | ORAL_TABLET | Freq: Every day | ORAL | Status: DC
  Administered 2023-11-26 – 2023-11-27 (×2): 1 via ORAL
  Filled 2023-11-26 (×2): qty 1

## 2023-11-26 MED ORDER — ACETAMINOPHEN 325 MG PO TABS: 650.0000 mg | ORAL_TABLET | Freq: Four times a day (QID) | ORAL | Status: DC | PRN

## 2023-11-26 MED ORDER — MAGNESIUM HYDROXIDE 400 MG/5ML PO SUSP: 30.0000 mL | Freq: Every day | ORAL | Status: DC | PRN

## 2023-11-26 MED ORDER — BENZTROPINE MESYLATE 0.5 MG PO TABS
0.5000 mg | ORAL_TABLET | Freq: Every day | ORAL | Status: DC
Start: 2023-11-26 — End: 2023-11-27
  Administered 2023-11-26 (×2): 0.5 mg via ORAL
  Filled 2023-11-26 (×2): qty 1

## 2023-11-26 MED ORDER — TRAZODONE HCL 50 MG PO TABS
50.0000 mg | ORAL_TABLET | Freq: Every evening | ORAL | Status: DC | PRN
  Administered 2023-11-26 (×2): 50 mg via ORAL
  Filled 2023-11-26 (×2): qty 1

## 2023-11-26 MED ORDER — ALUM & MAG HYDROXIDE-SIMETH 200-200-20 MG/5ML PO SUSP: 30.0000 mL | ORAL | Status: DC | PRN

## 2023-11-26 NOTE — ED Notes (Signed)
Pt A&O x 4, presents with auditory hallucinations increasing in severity within past 24 hrs.  Pt is HIV positive..    Pt reports he has been injecting marijuana water in his veins to calm the voices.  Denies SI, HI. or visual hallucinations.  Comfort measures given.  Monitoring for safety.

## 2023-11-26 NOTE — ED Notes (Signed)
Pt sleeping in no acute distress. RR even and unlabored. Environment secured. Will continue to monitor for safety. 

## 2023-11-26 NOTE — Progress Notes (Signed)
   11/26/23 0002  [facility] Triage Screening (Walk-ins at [facility] only)  How Did You Hear About [place]? Legal System  What Is the Reason for Your Visit/Call Today? Pt presents to [facility] voluntarily, accompanied by [employer] with complaint of auditory hallucinations. Pt reports that he called police to his home tonight due to hearing voices. Pt states that often times he hears the voices and speaks back to them. Pt reports that the voices are not commanding in nature or telling him to do anything specifically, however they are saying and reminding him of bad things that have hurt him in the past. Pt reports experiencing AH on and off for a few weeks. Pt reports diagnosis of anxiety and schizophrenia. Pt also reports that he is not prescribed medications at this time, but was taking Abilify and Cogentin a little over a year ago. Pt reports that he injected weed water to calm down the voices earlier today. Pt currently denies SI,HI.  How Long Has This Been Causing You Problems? 1 wk - 1 month  Have You Recently Had Any Thoughts About Hurting Yourself? No  Are You Planning to Commit Suicide/Harm Yourself At This time? No  Have you Recently Had Thoughts About Hurting Someone Karolee Ohs? No  Are You Planning To Harm Someone At This Time? No  Physical Abuse Denies  Verbal Abuse Denies  Sexual Abuse Denies  Exploitation of patient/patient's resources Denies  Self-Neglect Denies  Are you currently experiencing any auditory, visual or other hallucinations? Yes  Please explain the hallucinations you are currently experiencing: hearing voices reminding him of bad things and memories that hurt him in the past  Have You Used Any Alcohol or Drugs in the Past 24 Hours? Yes  How long ago did you use Drugs or Alcohol? earlier today  What Did You Use and How Much? pt reports injecting weed water  Do you have any current medical co-morbidities that require immediate attention? No  Clinician description of patient physical  appearance/behavior: Pt is calm,cooperative, casually dressed  What Do You Feel Would Help You the Most Today? Treatment for Depression or other mood problem;Medication(s)  If access to [facility] was not available, would you have sought care in the Emergency Department? Yes  Determination of Need Urgent (48 hours)  Options For Referral Other: Comment;BH Urgent Care;Outpatient Therapy;Medication Management  Determination of Need filed? Yes

## 2023-11-26 NOTE — ED Notes (Addendum)
Patient A&Ox4. Denies intent to harm self/others when asked. Denies A/VH. Patient denies any physical complaints when asked. No acute distress noted. Pt states, "I feel much better today". Support and praise given. Routine safety checks conducted according to facility protocol. Encouraged patient to notify staff if thoughts of harm toward self or others arise. Patient verbalize understanding and agreement. Will continue to monitor for safety.

## 2023-11-26 NOTE — BH Assessment (Signed)
Comprehensive Clinical Assessment (CCA) Note  11/26/2023 Kenneth Welch [id number]  Chief Complaint:  Chief Complaint  Patient presents with   Hallucinations    DISPOSITION: Per Roselyn Bering, NP patient is recommended for overnight observation.  The patient demonstrates the following risk factors for suicide: Chronic risk factors for suicide include: psychiatric disorder of Schizoaffective disorder and medical illness HIV . Acute risk factors for suicide include: N/A. Protective factors for this patient include: hope for the future. Considering these factors, the overall suicide risk at this point appears to be low. Patient is appropriate for outpatient follow up.   Kenneth Welch is a 37 year old male who presents to [facility] escorted by [employer] voluntarily due to worsening auditory hallucinations. Pt reports the voices were increasing so he decided to call the police for assistance. He denies auditory or visual hallucinations currently. Per chart review pt has a history of schizoaffective disorder, and HIV. Pt reports he has been without medication for about 1 year. He states he is not established with outpatient therapy or psychiatry services. He states he currently lives alone and does not have a good support system. Pt reports occasional marijuana use by "sniffing" (not smoking) and states recently he started to "inject the essence" of the marijuana into his arm because it "calms the voices". Pt reports he has injected his self with THC water 3 times, with the last time being tonight. He reports he soaks the marijuana in water, takes a needle and injects it into his arm. Pt observed with a large swelling on his right forearm where he reports he injected the THC. NP notified of this report. He reports he is currently on disability and unemployed.   He reports currently having a warrant for his arrest for damaging property. He reports a previous failed suicide attempt earlier this year  while he was at [facility] for medical reasons, he reports attempting to overdose on the medication he was given. He states his main stressors are not feeling understood by others and feeling like people are taking advantage of his kindness. Pt reports crying spells, irritability, hopelessness, unstable sleeping patterns and decreased appetite. He reports a previous psychiatric hospitalization in 2004 for psychosis. He denies family history of mental health concerns. He denies any other alcohol or substance use. He denies access to weapons. He denies history of abuse or trauma. Pt denies SI/HI, paranoia and NSSIB currently.   Visit Diagnosis: Schizoaffective Disorder  CCA Screening, Triage and Referral (STR)  Patient Reported Information How did you hear about [place]? Legal System  What Is the Reason for Your Visit/Call Today? Kenneth Welch is a 37 year old male who presents to [facility] escorted by [employer] voluntarily due to worsening auditory hallucinations. Pt reports the voices were increasing so he decided to call the police for assistance. He denies auditory or visual hallucinations currently. Per chart review pt has a history of schizoaffective disorder, and HIV. Pt reports he has been without medication for about 1 year. Denies SI/HI, paranoia, and AVH currently.  How Long Has This Been Causing You Problems? 1 wk - 1 month  What Do You Feel Would Help You the Most Today? Treatment for Depression or other mood problem; Medication(s)   Have You Recently Had Any Thoughts About Hurting Yourself? No  Are You Planning to Commit Suicide/Harm Yourself At This time? No   Flowsheet Row ED from 11/26/2023 in [facility] ED from 10/23/2023 in [facility] ED from  06/23/2023 in [facility]  C-SSRS RISK CATEGORY No Risk No Risk No Risk       Have you Recently Had Thoughts About Hurting Someone Karolee Ohs? No  Are  You Planning to Harm Someone at This Time? No  Explanation: n/a   Have You Used Any Alcohol or Drugs in the Past 24 Hours? Yes  What Did You Use and How Much? reports injecting THC into his arm   Do You Currently Have a Therapist/Psychiatrist? No  Name of Therapist/Psychiatrist: Name of Therapist/Psychiatrist: n/a   Have You Been Recently Discharged From Any Office Practice or Programs? No  Explanation of Discharge From Practice/Program: denies     CCA Screening Triage Referral Assessment Type of Contact: Face-to-Face  Telemedicine Service Delivery:   Is this Initial or Reassessment?   Date Telepsych consult ordered in [facility]:    Time Telepsych consult ordered in [facility]:    Location of Assessment: [facility] Assessment Services  Provider Location: [facility] Assessment Services   Collateral Involvement: None   Does Patient Have a Automotive engineer Guardian? No  Legal Guardian Contact Information: denies having a legal guardian  Copy of Legal Guardianship Form: -- (denies having a legal guardian)  Legal Guardian Notified of Arrival: -- (denies having a legal guardian)  Legal Guardian Notified of Pending Discharge: -- (denies having a legal guardian)  If Minor and Not Living with Parent(s), Who has Custody? adult  Is CPS involved or ever been involved? Never  Is APS involved or ever been involved? Never   Patient Determined To Be At Risk for Harm To Self or Others Based on Review of Patient Reported Information or Presenting Complaint? No  Method: No Plan  Availability of Means: No access or NA  Intent: Vague intent or NA  Notification Required: No need or identified person  Additional Information for Danger to Others Potential: -- (N/A)  Additional Comments for Danger to Others Potential: na  Are There Guns or Other Weapons in Your Home? No (denied)  Types of Guns/Weapons: denies access to weapons  Are These Weapons Safely Secured?                             -- (denies access to weapons)  Who Could Verify You Are Able To Have These Secured: denies access to weapons  Do You Have any Outstanding Charges, Pending Court Dates, Parole/Probation? Pt reports warrant for his arrest due to damaging property  Contacted To Inform of Risk of Harm To Self or Others: Other: Comment (N/A)    Does Patient Present under Involuntary Commitment? No    [place] of Residence: [place]   Patient Currently Receiving the Following Services: Not Receiving Services   Determination of Need: Urgent (48 hours)   Options For Referral: [facility] Urgent Care; Medication Management; Outpatient Therapy     CCA Biopsychosocial Patient Reported Schizophrenia/Schizoaffective Diagnosis in Past: Yes   Strengths: asking for help   Mental Health Symptoms Depression:  Hopelessness; Sleep (too much or little); Tearfulness; Irritability; Increase/decrease in appetite   Duration of Depressive symptoms: Duration of Depressive Symptoms: Greater than two weeks   Mania:  None   Anxiety:   Tension; Worrying   Psychosis:  None   Duration of Psychotic symptoms:    Trauma:  None   Obsessions:  None   Compulsions:  None   Inattention:  None   Hyperactivity/Impulsivity:  None   Oppositional/Defiant Behaviors:  None  Emotional Irregularity:  None   Other Mood/Personality Symptoms:  N/A    Mental Status Exam Appearance and self-care  Stature:  Tall   Weight:  Average weight   Clothing:  Casual (Scrubs)   Grooming:  Normal   Cosmetic use:  None   Posture/gait:  Normal; Tense   Motor activity:  Not Remarkable   Sensorium  Attention:  Normal   Concentration:  Normal   Orientation:  X5   Recall/memory:  Normal   Affect and Mood  Affect:  Flat   Mood:  Anxious (Calm)   Relating  Eye contact:  Normal   Facial expression:  Constricted; Anxious   Attitude toward examiner:  Cooperative   Thought and Language  Speech flow: Normal   Thought  content:  Appropriate to Mood and Circumstances   Preoccupation:  None   Hallucinations:  None   Organization:  Intact   [employer] of Knowledge:  Average   Intelligence:  Average   Abstraction:  Normal   Judgement:  Poor   Reality Testing:  Adequate   Insight:  Poor; Gaps   Decision Making:  Impulsive   Social Functioning  Social Maturity:  Impulsive   Social Judgement:  Heedless; "Street Smart"   Stress  Stressors:  Transitions; Other (Comment) (mental health)   Coping Ability:  Overwhelmed; Exhausted   Skill Deficits:  Decision making   Supports:  Support needed     Religion: Religion/Spirituality Are You A Religious Person?: Yes What is Your Religious Affiliation?: Christian How Might This Affect Treatment?: N/A  Leisure/Recreation: Leisure / Recreation Do You Have Hobbies?: No  Exercise/Diet: Exercise/Diet Do You Exercise?: No Have You Gained or Lost A Significant Amount of Weight in the Past Six Months?: No Do You Follow a Special Diet?: No Do You Have Any Trouble Sleeping?: Yes Explanation of Sleeping Difficulties: Patient reports getting poor sleep recently   CCA Employment/Education Employment/Work Situation: Employment / Work Situation Employment Situation: On disability Why is Patient on Disability: Due to schizoaffective diagnosis How Long has Patient Been on Disability: Since 2013 Patient's Job has Been Impacted by Current Illness: No Has Patient ever Been in the [employer]?: No  Education: Education Is Patient Currently Attending School?: No Last Grade Completed: 12 (some college) Did Theme park manager?: Yes What Type of College Degree Do you Have?: no degree reported Did You Have An Individualized Education Program (IIEP): No Did You Have Any Difficulty At School?: No Patient's Education Has Been Impacted by Current Illness: No   CCA Family/Childhood History Family and Relationship History: Family  history Marital status: Single Does patient have children?: No  Childhood History:  Childhood History By whom was/is the patient raised?: Both parents (Pt stated both his parents are deceased now.) Did patient suffer any verbal/emotional/physical/sexual abuse as a child?: No Did patient suffer from severe childhood neglect?: No Has patient ever been sexually abused/assaulted/raped as an adolescent or adult?: No Was the patient ever a victim of a crime or a disaster?: No Witnessed domestic violence?: No Has patient been affected by domestic violence as an adult?: No       CCA Substance Use Alcohol/Drug Use: Alcohol / Drug Use Pain Medications: See MAR Prescriptions: See MAR Over the Counter: See MAR History of alcohol / drug use?: Yes (occasional marijuana) Longest period of sobriety (when/how long): Unknown Negative Consequences of Use:  (N/A) Withdrawal Symptoms: None  ASAM's:  Six Dimensions of Multidimensional Assessment  Dimension 1:  Acute Intoxication and/or Withdrawal Potential:   Dimension 1:  Description of individual's past and current experiences of substance use and withdrawal: Patient denies withdrawal symptoms  Dimension 2:  Biomedical Conditions and Complications:   Dimension 2:  Description of patient's biomedical conditions and  complications: Patient has several medical conditions.  Dimension 3:  Emotional, Behavioral, or Cognitive Conditions and Complications:  Dimension 3:  Description of emotional, behavioral, or cognitive conditions and complications: Patient reports previous SI/HI; Schizophrenia  Dimension 4:  Readiness to Change:  Dimension 4:  Description of Readiness to Change criteria: Patient does not acknowledge a need for change  Dimension 5:  Relapse, Continued use, or Continued Problem Potential:  Dimension 5:  Relapse, continued use, or continued problem potential critiera description: Patient is lacking insight   Dimension 6:  Recovery/Living Environment:  Dimension 6:  Recovery/Iiving environment criteria description: Patient reports being homeless  ASAM Severity Score: ASAM's Severity Rating Score: 11  ASAM Recommended Level of Treatment: ASAM Recommended Level of Treatment: Level II Intensive Outpatient Treatment   Substance use Disorder (SUD) Substance Use Disorder (SUD)  Checklist Symptoms of Substance Use: Continued use despite persistent or recurrent social, interpersonal problems, caused or exacerbated by use, Continued use despite having a persistent/recurrent physical/psychological problem caused/exacerbated by use  Recommendations for Services/Supports/Treatments: Recommendations for Services/Supports/Treatments Recommendations For Services/Supports/Treatments: Individual Therapy  Disposition Recommendation per psychiatric provider: Recommended for overnight observation.    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     Referrals to Alternative Service(s): Referred to Alternative Service(s):   Place:   Date:   Time:    Referred to Alternative Service(s):   Place:   Date:   Time:    Referred to Alternative Service(s):   Place:   Date:   Time:    Referred to Alternative Service(s):   Place:   Date:   Time:     Loma Newton, [facility]

## 2023-11-26 NOTE — ED Notes (Signed)
Pt sleeping at present, no distress noted.  Monitoring for safety. 

## 2023-11-26 NOTE — ED Provider Notes (Signed)
[facility] Continuous Assessment Admission H&P  Date: 11/26/23 Patient Name: Kenneth Welch. MRN: [id number] Chief Complaint: I want to get back on my medications  Diagnoses:  Final diagnoses:  Schizoaffective disorder, unspecified type (HCC)    HPI: Kenneth Welch. Is a 37 y/o male single and unemployed with a history of schizoaffective disorder presenting to [facility] via [employer]. Patient reports that he has been off his medications for almost a year and he wants to restart his medications.   Nurse practitioner assessed patient face to face and reviewed his chart. Patient is alert oriented x3, calm and cooperative, speech is clear and coherent, mood is euthymic with congruent affect, denies current SI or HI but endorses having AH. Patient reports that he has been having auditory hallucination of his neck talking to him but his neck is not saying any words.   Patient reports that he has been off his medications for about a year, was previously being followed by Dr. Lolly Mustache for psychiatric medication management. Patient reports he was previously prescribed abilify maintena IM injection, cogentin 0.5mg  and reports they werw working well for him when he was taking them. Patient states that he is no longer being followed by Dr. Lolly Mustache because he missed too many appointments. Patient reports that he lives alone and receives SSI. Patient reports that he is estranged from his sisters and he does not have any other family or friends. Patient reports parents are deceased.  Discussed with patient that he can be seen at [facility] outpatient clinic for ongoing medication management. Patient states that he has not been taking his antiretroviral Biktarvy because some stole his medication. Recommended to patient to go to the [facility] for treatment after he is discharged.  Patient will be admitted to [facility] continuous observation for crisis management, safety and  stabilization.   Total Time spent with patient: 20 minutes  Musculoskeletal  Strength & Muscle Tone: within normal limits Gait & Station: normal Patient leans: N/A  Psychiatric Specialty Exam  Presentation General Appearance:  Disheveled  Eye Contact: Fair  Speech: Clear and Coherent  Speech Volume: Normal  Handedness: Right   Mood and Affect  Mood: Euthymic  Affect: Congruent   Thought Process  Thought Processes: Coherent  Descriptions of Associations:Intact  Orientation:Full (Time, Place and Person)  Thought Content:Tangential  Diagnosis of Schizophrenia or Schizoaffective disorder in past: Yes  Duration of Psychotic Symptoms: Greater than six months  Hallucinations:Hallucinations: None; Auditory Description of Auditory Hallucinations: hears his neck talking to but his neck is not using words  Ideas of Reference:None  Suicidal Thoughts:Suicidal Thoughts: No  Homicidal Thoughts:Homicidal Thoughts: No   Sensorium  Memory: Immediate Fair; Recent Fair; Remote Fair  Judgment: Fair  Insight: Fair   Art therapist  Concentration: Fair  Attention Span: Fair  Recall: [employer] of Knowledge: Fair  Language: Fair   Psychomotor Activity  Psychomotor Activity: Psychomotor Activity: Normal   Assets  Assets: Communication Skills; Desire for Improvement; Housing; Resilience   Sleep  Sleep: Sleep: Good Number of Hours of Sleep: 8   Nutritional Assessment (For OBS and FBC admissions only) Has the patient had a weight loss or gain of 10 pounds or more in the last 3 months?: No Has the patient had a decrease in food intake/or appetite?: Yes Does the patient have dental problems?: No Does the patient have eating habits or behaviors that may be indicators of an eating disorder including binging or  inducing vomiting?: No Has the patient recently lost weight without trying?: 0 Has the patient been eating poorly because of a  decreased appetite?: 0 Malnutrition Screening Tool Score: 0    Physical Exam HENT:     Head: Normocephalic and atraumatic.     Nose: Nose normal.  Eyes:     Pupils: Pupils are equal, round, and reactive to light.  Cardiovascular:     Rate and Rhythm: Normal rate.  Abdominal:     General: Abdomen is flat.  Musculoskeletal:        General: Normal range of motion.     Cervical back: Normal range of motion.  Skin:    General: Skin is warm.  Neurological:     Mental Status: He is alert and oriented to person, place, and time.  Psychiatric:        Attention and Perception: Attention normal.        Mood and Affect: Mood is anxious.        Speech: Speech normal.        Behavior: Behavior is cooperative.        Thought Content: Thought content is not paranoid or delusional. Thought content does not include homicidal or suicidal ideation. Thought content does not include homicidal or suicidal plan.        Cognition and Memory: Cognition normal.        Judgment: Judgment is impulsive.    Review of Systems  Constitutional: Negative.   HENT: Negative.    Eyes: Negative.   Respiratory: Negative.    Cardiovascular: Negative.   Gastrointestinal: Negative.   Genitourinary: Negative.   Musculoskeletal: Negative.   Skin: Negative.   Neurological: Negative.   Endo/Heme/Allergies: Negative.   Psychiatric/Behavioral:  Positive for hallucinations.     Blood pressure 132/81, pulse 71, temperature 98.9 F (37.2 C), temperature source Oral, resp. rate 18, SpO2 100%. There is no height or weight on file to calculate BMI.  Past Psychiatric History: schizoaffective disorder  Is the patient at risk to self? No  Has the patient been a risk to self in the past 6 months? No .    Has the patient been a risk to self within the distant past? No   Is the patient a risk to others? No   Has the patient been a risk to others in the past 6 months? No   Has the patient been a risk to others within the  distant past? No   Past Medical History:  HIV No current facility-administered medications on file prior to encounter.   Current Outpatient Medications on File Prior to Encounter  Medication Sig Dispense Refill   ARIPiprazole ER (ABILIFY MAINTENA) 400 MG SRER injection Inject 2 mLs (400 mg total) into the muscle every 28 (twenty-eight) days. 1 each 0   benztropine (COGENTIN) 0.5 MG tablet Take 1 tablet (0.5 mg total) by mouth at bedtime. 90 tablet 0   bictegravir-emtricitabine-tenofovir AF (BIKTARVY) 50-200-25 MG TABS tablet Take 1 tablet by mouth daily. 30 tablet 11     Family History: Denies any significant family history  Social History: 37 y/o male single and unemployed, lives alone, estraged from family members  Last Labs:  Admission on 11/26/2023  Component Date Value Ref Range Status   WBC 11/26/2023 6.5  4.0 - 10.5 K/uL Final   RBC 11/26/2023 4.17 (L)  4.22 - 5.81 MIL/uL Final   Hemoglobin 11/26/2023 12.7 (L)  13.0 - 17.0 g/dL Final   HCT 65/78/4696 38.7 (L)  39.0 - 52.0 % Final   MCV 11/26/2023 92.8  80.0 - 100.0 fL Final   MCH 11/26/2023 30.5  26.0 - 34.0 pg Final   MCHC 11/26/2023 32.8  30.0 - 36.0 g/dL Final   RDW 13/24/4010 14.4  11.5 - 15.5 % Final   Platelets 11/26/2023 229  150 - 400 K/uL Final   nRBC 11/26/2023 0.0  0.0 - 0.2 % Final   Neutrophils Relative % 11/26/2023 55  % Final   Neutro Abs 11/26/2023 3.6  1.7 - 7.7 K/uL Final   Lymphocytes Relative 11/26/2023 32  % Final   Lymphs Abs 11/26/2023 2.1  0.7 - 4.0 K/uL Final   Monocytes Relative 11/26/2023 7  % Final   Monocytes Absolute 11/26/2023 0.4  0.1 - 1.0 K/uL Final   Eosinophils Relative 11/26/2023 5  % Final   Eosinophils Absolute 11/26/2023 0.3  0.0 - 0.5 K/uL Final   Basophils Relative 11/26/2023 1  % Final   Basophils Absolute 11/26/2023 0.0  0.0 - 0.1 K/uL Final   Immature Granulocytes 11/26/2023 0  % Final   Abs Immature Granulocytes 11/26/2023 0.01  0.00 - 0.07 K/uL Final   Performed at [facility], [address], [place], [place] [address]   POC Amphetamine UR 11/26/2023 None Detected  NONE DETECTED (Cut Off Level 1000 ng/mL) Final   POC Secobarbital (BAR) 11/26/2023 None Detected  NONE DETECTED (Cut Off Level 300 ng/mL) Final   POC Buprenorphine (BUP) 11/26/2023 None Detected  NONE DETECTED (Cut Off Level 10 ng/mL) Final   POC Oxazepam (BZO) 11/26/2023 None Detected  NONE DETECTED (Cut Off Level 300 ng/mL) Final   POC Cocaine UR 11/26/2023 None Detected  NONE DETECTED (Cut Off Level 300 ng/mL) Final   POC Methamphetamine UR 11/26/2023 None Detected  NONE DETECTED (Cut Off Level 1000 ng/mL) Final   POC Morphine 11/26/2023 None Detected  NONE DETECTED (Cut Off Level 300 ng/mL) Final   POC Methadone UR 11/26/2023 None Detected  NONE DETECTED (Cut Off Level 300 ng/mL) Final   POC Oxycodone UR 11/26/2023 None Detected  NONE DETECTED (Cut Off Level 100 ng/mL) Final   POC Marijuana UR 11/26/2023 None Detected  NONE DETECTED (Cut Off Level 50 ng/mL) Final  Admission on 10/23/2023, Discharged on 10/23/2023  Component Date Value Ref Range Status   Sodium 10/23/2023 134 (L)  135 - 145 mmol/L Final   Potassium 10/23/2023 4.4  3.5 - 5.1 mmol/L Final   Chloride 10/23/2023 106  98 - 111 mmol/L Final   CO2 10/23/2023 18 (L)  22 - 32 mmol/L Final   Glucose, Bld 10/23/2023 87  70 - 99 mg/dL Final   Glucose reference range applies only to samples taken after fasting for at least 8 hours.   BUN 10/23/2023 10  6 - 20 mg/dL Final   Creatinine, Ser 10/23/2023 1.06  0.61 - 1.24 mg/dL Final   Calcium 66/44/0347 9.1  8.9 - 10.3 mg/dL Final   Total Protein 42/59/5638 7.6  6.5 - 8.1 g/dL Final   Albumin 75/64/3329 3.9  3.5 - 5.0 g/dL Final   AST 51/88/4166 29  15 - 41 U/L Final   ALT 10/23/2023 16  0 - 44 U/L Final   Alkaline Phosphatase 10/23/2023 51  38 - 126 U/L Final   Total Bilirubin 10/23/2023 0.6  <1.2 mg/dL Final   GFR, Estimated 10/23/2023 >60  >60 mL/min Final   Comment:  (NOTE) Calculated using the CKD-EPI Creatinine Equation (2021)    Anion  gap 10/23/2023 10  5 - 15 Final   Performed at [facility], [address], [place], [place] [address]   Alcohol, Ethyl (B) 10/23/2023 <10  <10 mg/dL Final   Comment: (NOTE) Lowest detectable limit for serum alcohol is 10 mg/dL.  For medical purposes only. Performed at [facility], [address] [place], [place] [address]    Opiates 10/23/2023 NONE DETECTED  NONE DETECTED Final   Cocaine 10/23/2023 NONE DETECTED  NONE DETECTED Final   Benzodiazepines 10/23/2023 NONE DETECTED  NONE DETECTED Final   Amphetamines 10/23/2023 NONE DETECTED  NONE DETECTED Final   Tetrahydrocannabinol 10/23/2023 NONE DETECTED  NONE DETECTED Final   Barbiturates 10/23/2023 NONE DETECTED  NONE DETECTED Final   Comment: (NOTE) DRUG SCREEN FOR MEDICAL PURPOSES ONLY.  IF CONFIRMATION IS NEEDED FOR ANY PURPOSE, NOTIFY LAB WITHIN 5 DAYS.  LOWEST DETECTABLE LIMITS FOR URINE DRUG SCREEN Drug Class                     Cutoff (ng/mL) Amphetamine and metabolites    1000 Barbiturate and metabolites    200 Benzodiazepine                 200 Opiates and metabolites        300 Cocaine and metabolites        300 THC                            50 Performed at [facility], [address], [place], [place] [address]    WBC 10/23/2023 4.8  4.0 - 10.5 K/uL Final   RBC 10/23/2023 4.96  4.22 - 5.81 MIL/uL Final   Hemoglobin 10/23/2023 15.2  13.0 - 17.0 g/dL Final   HCT 91/47/8295 46.2  39.0 - 52.0 % Final   MCV 10/23/2023 93.1  80.0 - 100.0 fL Final   MCH 10/23/2023 30.6  26.0 - 34.0 pg Final   MCHC 10/23/2023 32.9  30.0 - 36.0 g/dL Final   RDW 62/13/0865 14.4  11.5 - 15.5 % Final   Platelets 10/23/2023 225  150 - 400 K/uL Final   nRBC 10/23/2023 0.0  0.0 - 0.2 % Final   Neutrophils Relative % 10/23/2023 40  % Final   Neutro Abs 10/23/2023 1.9  1.7 - 7.7 K/uL Final   Lymphocytes Relative 10/23/2023 44  % Final   Lymphs  Abs 10/23/2023 2.2  0.7 - 4.0 K/uL Final   Monocytes Relative 10/23/2023 9  % Final   Monocytes Absolute 10/23/2023 0.4  0.1 - 1.0 K/uL Final   Eosinophils Relative 10/23/2023 6  % Final   Eosinophils Absolute 10/23/2023 0.3  0.0 - 0.5 K/uL Final   Basophils Relative 10/23/2023 1  % Final   Basophils Absolute 10/23/2023 0.0  0.0 - 0.1 K/uL Final   Immature Granulocytes 10/23/2023 0  % Final   Abs Immature Granulocytes 10/23/2023 0.01  0.00 - 0.07 K/uL Final   Performed at [facility], [address], [place], [place] [address]   Total lymphocyte count 10/23/2023 2,194  1,000 - 4,000 /uL Final   CD4% 10/23/2023 26.20 (L)  33 - 65 % Final   CD4 absolute 10/23/2023 575  400 - 1,790 /uL Final   CD8tox 10/23/2023 34.30  12 - 40 % Final   CD8 T Cell Abs 10/23/2023 753  190 - 1,000 /uL Final   Ratio 10/23/2023 0.76 (L)  1.0 - 3.0 Final   Performed at [facility], [address], [place], [place] [address]  Admission on 06/13/2023, Discharged on 06/14/2023  Component Date Value Ref Range Status   WBC 06/13/2023 7.8  4.0 - 10.5 K/uL Final   RBC 06/13/2023 4.84  4.22 - 5.81 MIL/uL Final   Hemoglobin 06/13/2023 14.6  13.0 - 17.0 g/dL Final   HCT 78/46/9629 45.0  39.0 - 52.0 % Final   MCV 06/13/2023 93.0  80.0 - 100.0 fL Final   MCH 06/13/2023 30.2  26.0 - 34.0 pg Final   MCHC 06/13/2023 32.4  30.0 - 36.0 g/dL Final   RDW 52/84/1324 15.0  11.5 - 15.5 % Final   Platelets 06/13/2023 249  150 - 400 K/uL Final   nRBC 06/13/2023 0.0  0.0 - 0.2 % Final   Neutrophils Relative % 06/13/2023 48  % Final   Neutro Abs 06/13/2023 3.7  1.7 - 7.7 K/uL Final   Lymphocytes Relative 06/13/2023 43  % Final   Lymphs Abs 06/13/2023 3.4  0.7 - 4.0 K/uL Final   Monocytes Relative 06/13/2023 6  % Final   Monocytes Absolute 06/13/2023 0.5  0.1 - 1.0 K/uL Final   Eosinophils Relative 06/13/2023 2  % Final   Eosinophils Absolute 06/13/2023 0.2  0.0 - 0.5 K/uL Final   Basophils Relative  06/13/2023 1  % Final   Basophils Absolute 06/13/2023 0.0  0.0 - 0.1 K/uL Final   Immature Granulocytes 06/13/2023 0  % Final   Abs Immature Granulocytes 06/13/2023 0.01  0.00 - 0.07 K/uL Final   Performed at [facility], [address], [place], [place] [address]   Sodium 06/13/2023 138  135 - 145 mmol/L Final   Potassium 06/13/2023 4.5  3.5 - 5.1 mmol/L Final   Chloride 06/13/2023 99  98 - 111 mmol/L Final   CO2 06/13/2023 28  22 - 32 mmol/L Final   Glucose, Bld 06/13/2023 85  70 - 99 mg/dL Final   Glucose reference range applies only to samples taken after fasting for at least 8 hours.   BUN 06/13/2023 13  6 - 20 mg/dL Final   Creatinine, Ser 06/13/2023 1.06  0.61 - 1.24 mg/dL Final   Calcium 72/53/6644 9.9  8.9 - 10.3 mg/dL Final   Total Protein 03/47/4259 8.4 (H)  6.5 - 8.1 g/dL Final   Albumin 56/38/7564 4.5  3.5 - 5.0 g/dL Final   AST 33/29/5188 21  15 - 41 U/L Final   ALT 06/13/2023 17  0 - 44 U/L Final   Alkaline Phosphatase 06/13/2023 55  38 - 126 U/L Final   Total Bilirubin 06/13/2023 0.5  0.3 - 1.2 mg/dL Final   GFR, Estimated 06/13/2023 >60  >60 mL/min Final   Comment: (NOTE) Calculated using the CKD-EPI Creatinine Equation (2021)    Anion gap 06/13/2023 11  5 - 15 Final   Performed at [facility], [address] [place], [place] [address]   Hgb A1c MFr Bld 06/13/2023 5.9 (H)  4.8 - 5.6 % Final   Comment: (NOTE) Pre diabetes:          5.7%-6.4%  Diabetes:              >6.4%  Glycemic control for   <7.0% adults with diabetes    Mean Plasma Glucose 06/13/2023 122.63  mg/dL Final   Performed at [facility], [address], [place], [place] [address]   Magnesium 06/13/2023 2.3  1.7 - 2.4 mg/dL Final   Performed at [facility], [address] [place], [place] [address]   Alcohol, Ethyl (B) 06/13/2023 <10  <10 mg/dL Final   Comment: (NOTE) Lowest detectable limit for serum alcohol is 10 mg/dL.  For medical purposes only. Performed at  [facility], [address] [place], [place] [address]    TSH 06/13/2023 3.021  0.350 - 4.500 uIU/mL Final   Comment: Performed by a 3rd Generation assay with a functional sensitivity of <=0.01 uIU/mL. Performed at [facility], [address], [place], [place] [address]    Prolactin 06/13/2023 19.4  3.9 - 22.7 ng/mL Final   Comment: (NOTE) Performed At: [facility] [address] [place], [place] [id number] Jolene Schimke MD NU:[id number]    Color, Urine 06/13/2023 YELLOW  YELLOW Final   APPearance 06/13/2023 CLEAR  CLEAR Final   Specific Gravity, Urine 06/13/2023 1.026  1.005 - 1.030 Final   pH 06/13/2023 5.0  5.0 - 8.0 Final   Glucose, UA 06/13/2023 NEGATIVE  NEGATIVE mg/dL Final   Hgb urine dipstick 06/13/2023 NEGATIVE  NEGATIVE Final   Bilirubin Urine 06/13/2023 NEGATIVE  NEGATIVE Final   Ketones, ur 06/13/2023 NEGATIVE  NEGATIVE mg/dL Final   Protein, ur 34/74/2595 NEGATIVE  NEGATIVE mg/dL Final   Nitrite 63/87/5643 NEGATIVE  NEGATIVE Final   Leukocytes,Ua 06/13/2023 NEGATIVE  NEGATIVE Final   Performed at [facility], [address], [place], [place] [address]   POC Amphetamine UR 06/13/2023 None Detected  NONE DETECTED (Cut Off Level 1000 ng/mL) Final   POC Secobarbital (BAR) 06/13/2023 None Detected  NONE DETECTED (Cut Off Level 300 ng/mL) Final   POC Buprenorphine (BUP) 06/13/2023 None Detected  NONE DETECTED (Cut Off Level 10 ng/mL) Final   POC Oxazepam (BZO) 06/13/2023 None Detected  NONE DETECTED (Cut Off Level 300 ng/mL) Final   POC Cocaine UR 06/13/2023 None Detected  NONE DETECTED (Cut Off Level 300 ng/mL) Final   POC Methamphetamine UR 06/13/2023 None Detected  NONE DETECTED (Cut Off Level 1000 ng/mL) Final   POC Morphine 06/13/2023 None Detected  NONE DETECTED (Cut Off Level 300 ng/mL) Final   POC Methadone UR 06/13/2023 None Detected  NONE DETECTED (Cut Off Level 300 ng/mL) Final   POC Oxycodone UR 06/13/2023 None Detected  NONE  DETECTED (Cut Off Level 100 ng/mL) Final   POC Marijuana UR 06/13/2023 Positive (A)  NONE DETECTED (Cut Off Level 50 ng/mL) Final   Cholesterol 06/13/2023 204 (H)  0 - 200 mg/dL Final   Triglycerides 88/41/6606 138  <150 mg/dL Final   HDL 30/16/0109 55  >40 mg/dL Final   Total CHOL/HDL Ratio 06/13/2023 3.7  RATIO Final   VLDL 06/13/2023 28  0 - 40 mg/dL Final   LDL Cholesterol 06/13/2023 121 (H)  0 - 99 mg/dL Final   Comment:        Total Cholesterol/HDL:CHD Risk Coronary Heart Disease Risk Table                     Men   Women  1/2 Average Risk   3.4   3.3  Average Risk       5.0   4.4  2 X Average Risk   9.6   7.1  3 X Average Risk  23.4   11.0        Use the calculated Patient Ratio above and the CHD Risk Table to determine the patient's CHD Risk.  ATP III CLASSIFICATION (LDL):  <100     mg/dL   Optimal  956-213  mg/dL   Near or Above                    Optimal  130-159  mg/dL   Borderline  086-578  mg/dL   High  >469     mg/dL   Very High Performed at [facility], [address] [place], [place] [address]   Admission on 06/10/2023, Discharged on 06/11/2023  Component Date Value Ref Range Status   Neisseria Gonorrhea 06/10/2023 Positive (A)   Final   Chlamydia 06/10/2023 Negative   Final   Comment 06/10/2023 Normal Reference Ranger Chlamydia - Negative   Final   Comment 06/10/2023 Normal Reference Range Neisseria Gonorrhea - Negative   Final  Admission on 06/10/2023, Discharged on 06/10/2023  Component Date Value Ref Range Status   Color, Urine 06/10/2023 YELLOW  YELLOW Final   APPearance 06/10/2023 CLEAR  CLEAR Final   Specific Gravity, Urine 06/10/2023 1.026  1.005 - 1.030 Final   pH 06/10/2023 6.0  5.0 - 8.0 Final   Glucose, UA 06/10/2023 NEGATIVE  NEGATIVE mg/dL Final   Hgb urine dipstick 06/10/2023 NEGATIVE  NEGATIVE Final   Bilirubin Urine 06/10/2023 NEGATIVE  NEGATIVE Final   Ketones, ur 06/10/2023 NEGATIVE  NEGATIVE mg/dL Final   Protein, ur  84/13/2440 NEGATIVE  NEGATIVE mg/dL Final   Nitrite 10/01/2535 NEGATIVE  NEGATIVE Final   Leukocytes,Ua 06/10/2023 TRACE (A)  NEGATIVE Final   RBC / HPF 06/10/2023 0-5  0 - 5 RBC/hpf Final   WBC, UA 06/10/2023 11-20  0 - 5 WBC/hpf Final   Bacteria, UA 06/10/2023 RARE (A)  NONE SEEN Final   Squamous Epithelial / HPF 06/10/2023 0-5  0 - 5 /HPF Final   Mucus 06/10/2023 PRESENT   Final   Performed at [facility], [address], [place], [place] [address]   Trichomonas 06/10/2023 Negative   Final   Comment 06/10/2023 Normal Reference Range Trichomonas - Negative   Final    Allergies: Patient has no known allergies.  Medications:  Facility Ordered Medications  Medication   acetaminophen (TYLENOL) tablet 650 mg   alum & mag hydroxide-simeth (MAALOX/MYLANTA) 200-200-20 MG/5ML suspension 30 mL   magnesium hydroxide (MILK OF MAGNESIA) suspension 30 mL   traZODone (DESYREL) tablet 50 mg   ARIPiprazole (ABILIFY) tablet 10 mg   bictegravir-emtricitabine-tenofovir AF (BIKTARVY) 50-200-25 MG per tablet 1 tablet   benztropine (COGENTIN) tablet 0.5 mg   PTA Medications  Medication Sig   benztropine (COGENTIN) 0.5 MG tablet Take 1 tablet (0.5 mg total) by mouth at bedtime.   bictegravir-emtricitabine-tenofovir AF (BIKTARVY) 50-200-25 MG TABS tablet Take 1 tablet by mouth daily.   ARIPiprazole ER (ABILIFY MAINTENA) 400 MG SRER injection Inject 2 mLs (400 mg total) into the muscle every 28 (twenty-eight) days.      Medical Decision Making  Kenneth Welch. Is a 37 y/o male with a history of schizoaffective disorder presenting to [facility] via [employer]. Patient reports that he has been off his medications for almost a year and he wants to restart his medications.     Recommendations  Based on my evaluation the patient does not appear to have an emergency medical condition. Patient will be admitted to [facility] continuous observation for crisis management, safety and stabilization.    Jasper Riling, NP 11/26/23  2:39 AM

## 2023-11-26 NOTE — ED Notes (Signed)
Pt sitting on bed eating dinner. No acute distress noted. No concerns voiced. Informed pt to notify staff with any needs or assistance. Pt verbalized understanding or agreement. Will continue to monitor for safety.

## 2023-11-26 NOTE — ED Provider Notes (Signed)
Behavioral Health Progress Note  Date and Time: 11/26/2023 6:32 PM Name: Kenneth Welch. MRN:  [id number]  Subjective: "I am doing better today... The voices are not as much "  Diagnosis:  Final diagnoses:  Schizoaffective disorder, unspecified type (HCC)   Kenneth Welch. 37 y.o., male patient presented to [facility] as a walk in voluntarily accompanied by [employer] with complaints of auditory hallucinations.  Kenneth Welch., 37 y.o., male patient seen face to face by this provider, consulted with Dr. Viviano Simas; and chart reviewed on 11/26/23.  On evaluation Kenneth Welch. reports continuation of auditory hallucination.  Patient states the hallucinations are better meaning they are not as loud.  Patient states that his mind continues to be filled so many thoughts it is hard for him to think. Patient states he has no family or close friends thus no collateral could be obtained.  During evaluation Kenneth Welch. is laying down in no acute distress.  He is alert, oriented x 3, calm, cooperative and attentive.  His mood is euthymic with congruent affect.  He has normal speech, and behavior.  Objectively there is no evidence of psychosis.  Patient is able to converse coherently, goal directed thoughts, no distractibility, or pre-occupation.  He also denies suicidal/self-harm/homicidal ideation, and paranoia.  Patient answered question appropriately.    Total Time spent with patient: 15 minutes  Past Psychiatric History: Patient has past psychiatric history of schizoaffective disorder Past Medical History: Patient has past medical history of HIV, asthma Family History: Patient's parents are deceased there is no pertinent family medical history provided Family Psychiatric  History: No pertinent family psychiatric history provided Social History: Patient lives alone.  Patient has an estranged sister and no other family.  Additional Social History:    Pain  Medications: See MAR Prescriptions: See MAR Over the Counter: See MAR History of alcohol / drug use?: Yes (occasional marijuana) Longest period of sobriety (when/how long): Unknown Negative Consequences of Use:  (N/A) Withdrawal Symptoms: None                   Sleep: Good  Appetite:  Good  Current Medications:  Current Facility-Administered Medications  Medication Dose Route Frequency Provider Last Rate Last Admin   acetaminophen (TYLENOL) tablet 650 mg  650 mg Oral Q6H PRN Bobbitt, Shalon E, NP       alum & mag hydroxide-simeth (MAALOX/MYLANTA) 200-200-20 MG/5ML suspension 30 mL  30 mL Oral Q4H PRN Bobbitt, Shalon E, NP       ARIPiprazole (ABILIFY) tablet 10 mg  10 mg Oral QHS Bobbitt, Shalon E, NP   10 mg at 11/26/23 0144   benztropine (COGENTIN) tablet 0.5 mg  0.5 mg Oral QHS Bobbitt, Shalon E, NP   0.5 mg at 11/26/23 0146   bictegravir-emtricitabine-tenofovir AF (BIKTARVY) 50-200-25 MG per tablet 1 tablet  1 tablet Oral Daily Bobbitt, Shalon E, NP   1 tablet at 11/26/23 0935   magnesium hydroxide (MILK OF MAGNESIA) suspension 30 mL  30 mL Oral Daily PRN Bobbitt, Shalon E, NP       traZODone (DESYREL) tablet 50 mg  50 mg Oral QHS PRN Bobbitt, Shalon E, NP   50 mg at 11/26/23 0144   Current Outpatient Medications  Medication Sig Dispense Refill   bictegravir-emtricitabine-tenofovir AF (BIKTARVY) 50-200-25 MG TABS tablet Take 1 tablet by mouth daily. (Patient not taking: Reported on 11/26/2023) 30 tablet 11    Labs  Lab Results:  Admission  on 11/26/2023  Component Date Value Ref Range Status   WBC 11/26/2023 6.5  4.0 - 10.5 K/uL Final   RBC 11/26/2023 4.17 (L)  4.22 - 5.81 MIL/uL Final   Hemoglobin 11/26/2023 12.7 (L)  13.0 - 17.0 g/dL Final   HCT 16/09/9603 38.7 (L)  39.0 - 52.0 % Final   MCV 11/26/2023 92.8  80.0 - 100.0 fL Final   MCH 11/26/2023 30.5  26.0 - 34.0 pg Final   MCHC 11/26/2023 32.8  30.0 - 36.0 g/dL Final   RDW 54/08/8118 14.4  11.5 - 15.5 % Final    Platelets 11/26/2023 229  150 - 400 K/uL Final   nRBC 11/26/2023 0.0  0.0 - 0.2 % Final   Neutrophils Relative % 11/26/2023 55  % Final   Neutro Abs 11/26/2023 3.6  1.7 - 7.7 K/uL Final   Lymphocytes Relative 11/26/2023 32  % Final   Lymphs Abs 11/26/2023 2.1  0.7 - 4.0 K/uL Final   Monocytes Relative 11/26/2023 7  % Final   Monocytes Absolute 11/26/2023 0.4  0.1 - 1.0 K/uL Final   Eosinophils Relative 11/26/2023 5  % Final   Eosinophils Absolute 11/26/2023 0.3  0.0 - 0.5 K/uL Final   Basophils Relative 11/26/2023 1  % Final   Basophils Absolute 11/26/2023 0.0  0.0 - 0.1 K/uL Final   Immature Granulocytes 11/26/2023 0  % Final   Abs Immature Granulocytes 11/26/2023 0.01  0.00 - 0.07 K/uL Final   Performed at [facility], [address], [place], [place] [address]   Sodium 11/26/2023 140  135 - 145 mmol/L Final   Potassium 11/26/2023 4.2  3.5 - 5.1 mmol/L Final   Chloride 11/26/2023 111  98 - 111 mmol/L Final   CO2 11/26/2023 25  22 - 32 mmol/L Final   Glucose, Bld 11/26/2023 86  70 - 99 mg/dL Final   Glucose reference range applies only to samples taken after fasting for at least 8 hours.   BUN 11/26/2023 11  6 - 20 mg/dL Final   Creatinine, Ser 11/26/2023 0.93  0.61 - 1.24 mg/dL Final   Calcium 95/62/1308 9.0  8.9 - 10.3 mg/dL Final   Total Protein 65/78/4696 6.5  6.5 - 8.1 g/dL Final   Albumin 29/52/8413 3.6  3.5 - 5.0 g/dL Final   AST 24/40/1027 36  15 - 41 U/L Final   ALT 11/26/2023 25  0 - 44 U/L Final   Alkaline Phosphatase 11/26/2023 51  38 - 126 U/L Final   Total Bilirubin 11/26/2023 0.5  <1.2 mg/dL Final   GFR, Estimated 11/26/2023 >60  >60 mL/min Final   Comment: (NOTE) Calculated using the CKD-EPI Creatinine Equation (2021)    Anion gap 11/26/2023 4 (L)  5 - 15 Final   Performed at [facility], [address], [place], [place] [address]   Alcohol, Ethyl (B) 11/26/2023 <10  <10 mg/dL Final   Comment: (NOTE) Lowest detectable limit for serum alcohol is 10  mg/dL.  For medical purposes only. Performed at [facility], [address], [place], [place] [address]    POC Amphetamine UR 11/26/2023 None Detected  NONE DETECTED (Cut Off Level 1000 ng/mL) Final   POC Secobarbital (BAR) 11/26/2023 None Detected  NONE DETECTED (Cut Off Level 300 ng/mL) Final   POC Buprenorphine (BUP) 11/26/2023 None Detected  NONE DETECTED (Cut Off Level 10 ng/mL) Final   POC Oxazepam (BZO) 11/26/2023 None Detected  NONE DETECTED (Cut Off Level 300 ng/mL)  Final   POC Cocaine UR 11/26/2023 None Detected  NONE DETECTED (Cut Off Level 300 ng/mL) Final   POC Methamphetamine UR 11/26/2023 None Detected  NONE DETECTED (Cut Off Level 1000 ng/mL) Final   POC Morphine 11/26/2023 None Detected  NONE DETECTED (Cut Off Level 300 ng/mL) Final   POC Methadone UR 11/26/2023 None Detected  NONE DETECTED (Cut Off Level 300 ng/mL) Final   POC Oxycodone UR 11/26/2023 None Detected  NONE DETECTED (Cut Off Level 100 ng/mL) Final   POC Marijuana UR 11/26/2023 None Detected  NONE DETECTED (Cut Off Level 50 ng/mL) Final   Cholesterol 11/26/2023 148  0 - 200 mg/dL Final   Triglycerides 16/09/9603 65  <150 mg/dL Final   HDL 54/08/8118 43  >40 mg/dL Final   Total CHOL/HDL Ratio 11/26/2023 3.4  RATIO Final   VLDL 11/26/2023 13  0 - 40 mg/dL Final   LDL Cholesterol 11/26/2023 92  0 - 99 mg/dL Final   Comment:        Total Cholesterol/HDL:CHD Risk Coronary Heart Disease Risk Table                     Men   Women  1/2 Average Risk   3.4   3.3  Average Risk       5.0   4.4  2 X Average Risk   9.6   7.1  3 X Average Risk  23.4   11.0        Use the calculated Patient Ratio above and the CHD Risk Table to determine the patient's CHD Risk.        ATP III CLASSIFICATION (LDL):  <100     mg/dL   Optimal  147-829  mg/dL   Near or Above                    Optimal  130-159  mg/dL   Borderline  562-130  mg/dL   High  >865     mg/dL   Very High Performed at [facility], [address], [place], [place] [address]   Admission on 10/23/2023, Discharged on 10/23/2023  Component Date Value Ref Range Status   Sodium 10/23/2023 134 (L)  135 - 145 mmol/L Final   Potassium 10/23/2023 4.4  3.5 - 5.1 mmol/L Final   Chloride 10/23/2023 106  98 - 111 mmol/L Final   CO2 10/23/2023 18 (L)  22 - 32 mmol/L Final   Glucose, Bld 10/23/2023 87  70 - 99 mg/dL Final   Glucose reference range applies only to samples taken after fasting for at least 8 hours.   BUN 10/23/2023 10  6 - 20 mg/dL Final   Creatinine, Ser 10/23/2023 1.06  0.61 - 1.24 mg/dL Final   Calcium 62/95/2841 9.1  8.9 - 10.3 mg/dL Final   Total Protein 32/44/0102 7.6  6.5 - 8.1 g/dL Final   Albumin 72/53/6644 3.9  3.5 - 5.0 g/dL Final   AST 03/47/4259 29  15 - 41 U/L Final   ALT 10/23/2023 16  0 - 44 U/L Final   Alkaline Phosphatase 10/23/2023 51  38 - 126 U/L Final   Total Bilirubin 10/23/2023 0.6  <1.2 mg/dL Final   GFR, Estimated 10/23/2023 >60  >60 mL/min Final   Comment: (NOTE) Calculated using the CKD-EPI Creatinine Equation (2021)    Anion gap 10/23/2023 10  5 - 15 Final   Performed at [facility], [address] [place],  [place] [address]   Alcohol, Ethyl (B) 10/23/2023 <10  <10 mg/dL Final   Comment: (NOTE) Lowest detectable limit for serum alcohol is 10 mg/dL.  For medical purposes only. Performed at [facility], [address], [place], [place] [address]    Opiates 10/23/2023 NONE DETECTED  NONE DETECTED Final   Cocaine 10/23/2023 NONE DETECTED  NONE DETECTED Final   Benzodiazepines 10/23/2023 NONE DETECTED  NONE DETECTED Final   Amphetamines 10/23/2023 NONE DETECTED  NONE DETECTED Final   Tetrahydrocannabinol 10/23/2023 NONE DETECTED  NONE DETECTED Final   Barbiturates 10/23/2023 NONE DETECTED  NONE DETECTED Final   Comment: (NOTE) DRUG SCREEN FOR MEDICAL PURPOSES ONLY.  IF CONFIRMATION IS NEEDED FOR ANY PURPOSE, NOTIFY LAB WITHIN 5 DAYS.  LOWEST DETECTABLE LIMITS FOR URINE DRUG  SCREEN Drug Class                     Cutoff (ng/mL) Amphetamine and metabolites    1000 Barbiturate and metabolites    200 Benzodiazepine                 200 Opiates and metabolites        300 Cocaine and metabolites        300 THC                            50 Performed at [facility], [address], [place], [place] [address]    WBC 10/23/2023 4.8  4.0 - 10.5 K/uL Final   RBC 10/23/2023 4.96  4.22 - 5.81 MIL/uL Final   Hemoglobin 10/23/2023 15.2  13.0 - 17.0 g/dL Final   HCT 34/74/2595 46.2  39.0 - 52.0 % Final   MCV 10/23/2023 93.1  80.0 - 100.0 fL Final   MCH 10/23/2023 30.6  26.0 - 34.0 pg Final   MCHC 10/23/2023 32.9  30.0 - 36.0 g/dL Final   RDW 63/87/5643 14.4  11.5 - 15.5 % Final   Platelets 10/23/2023 225  150 - 400 K/uL Final   nRBC 10/23/2023 0.0  0.0 - 0.2 % Final   Neutrophils Relative % 10/23/2023 40  % Final   Neutro Abs 10/23/2023 1.9  1.7 - 7.7 K/uL Final   Lymphocytes Relative 10/23/2023 44  % Final   Lymphs Abs 10/23/2023 2.2  0.7 - 4.0 K/uL Final   Monocytes Relative 10/23/2023 9  % Final   Monocytes Absolute 10/23/2023 0.4  0.1 - 1.0 K/uL Final   Eosinophils Relative 10/23/2023 6  % Final   Eosinophils Absolute 10/23/2023 0.3  0.0 - 0.5 K/uL Final   Basophils Relative 10/23/2023 1  % Final   Basophils Absolute 10/23/2023 0.0  0.0 - 0.1 K/uL Final   Immature Granulocytes 10/23/2023 0  % Final   Abs Immature Granulocytes 10/23/2023 0.01  0.00 - 0.07 K/uL Final   Performed at [facility], [address] [place], [place] [address]   Total lymphocyte count 10/23/2023 2,194  1,000 - 4,000 /uL Final   CD4% 10/23/2023 26.20 (L)  33 - 65 % Final   CD4 absolute 10/23/2023 575  400 - 1,790 /uL Final   CD8tox 10/23/2023 34.30  12 - 40 % Final   CD8 T Cell Abs 10/23/2023 753  190 - 1,000 /uL Final   Ratio 10/23/2023 0.76 (L)  1.0 - 3.0 Final   Performed at [facility], [address] [place], [place] [address]  Admission on  06/13/2023, Discharged on 06/14/2023  Component Date Value Ref Range Status   WBC 06/13/2023 7.8  4.0 - 10.5 K/uL Final   RBC 06/13/2023 4.84  4.22 - 5.81 MIL/uL Final   Hemoglobin 06/13/2023 14.6  13.0 - 17.0 g/dL Final   HCT 16/09/9603 45.0  39.0 - 52.0 % Final   MCV 06/13/2023 93.0  80.0 - 100.0 fL Final   MCH 06/13/2023 30.2  26.0 - 34.0 pg Final   MCHC 06/13/2023 32.4  30.0 - 36.0 g/dL Final   RDW 54/08/8118 15.0  11.5 - 15.5 % Final   Platelets 06/13/2023 249  150 - 400 K/uL Final   nRBC 06/13/2023 0.0  0.0 - 0.2 % Final   Neutrophils Relative % 06/13/2023 48  % Final   Neutro Abs 06/13/2023 3.7  1.7 - 7.7 K/uL Final   Lymphocytes Relative 06/13/2023 43  % Final   Lymphs Abs 06/13/2023 3.4  0.7 - 4.0 K/uL Final   Monocytes Relative 06/13/2023 6  % Final   Monocytes Absolute 06/13/2023 0.5  0.1 - 1.0 K/uL Final   Eosinophils Relative 06/13/2023 2  % Final   Eosinophils Absolute 06/13/2023 0.2  0.0 - 0.5 K/uL Final   Basophils Relative 06/13/2023 1  % Final   Basophils Absolute 06/13/2023 0.0  0.0 - 0.1 K/uL Final   Immature Granulocytes 06/13/2023 0  % Final   Abs Immature Granulocytes 06/13/2023 0.01  0.00 - 0.07 K/uL Final   Performed at [facility], [address], [place], [place] [address]   Sodium 06/13/2023 138  135 - 145 mmol/L Final   Potassium 06/13/2023 4.5  3.5 - 5.1 mmol/L Final   Chloride 06/13/2023 99  98 - 111 mmol/L Final   CO2 06/13/2023 28  22 - 32 mmol/L Final   Glucose, Bld 06/13/2023 85  70 - 99 mg/dL Final   Glucose reference range applies only to samples taken after fasting for at least 8 hours.   BUN 06/13/2023 13  6 - 20 mg/dL Final   Creatinine, Ser 06/13/2023 1.06  0.61 - 1.24 mg/dL Final   Calcium 95/62/1308 9.9  8.9 - 10.3 mg/dL Final   Total Protein 65/78/4696 8.4 (H)  6.5 - 8.1 g/dL Final   Albumin 29/52/8413 4.5  3.5 - 5.0 g/dL Final   AST 24/40/1027 21  15 - 41 U/L Final   ALT 06/13/2023 17  0 - 44 U/L Final   Alkaline Phosphatase  06/13/2023 55  38 - 126 U/L Final   Total Bilirubin 06/13/2023 0.5  0.3 - 1.2 mg/dL Final   GFR, Estimated 06/13/2023 >60  >60 mL/min Final   Comment: (NOTE) Calculated using the CKD-EPI Creatinine Equation (2021)    Anion gap 06/13/2023 11  5 - 15 Final   Performed at [facility], [address], [place], [place] [address]   Hgb A1c MFr Bld 06/13/2023 5.9 (H)  4.8 - 5.6 % Final   Comment: (NOTE) Pre diabetes:          5.7%-6.4%  Diabetes:              >6.4%  Glycemic control for   <7.0% adults with diabetes    Mean Plasma Glucose 06/13/2023 122.63  mg/dL Final   Performed at [facility], [address] [place], [place] [address]   Magnesium 06/13/2023 2.3  1.7 - 2.4 mg/dL Final   Performed at [facility], [address], [place],  [place] [address]   Alcohol, Ethyl (B) 06/13/2023 <10  <10 mg/dL Final   Comment: (NOTE) Lowest detectable limit for serum alcohol is 10 mg/dL.  For medical purposes only. Performed at [facility], [address] [place], [place] [address]    TSH 06/13/2023 3.021  0.350 - 4.500 uIU/mL Final   Comment: Performed by a 3rd Generation assay with a functional sensitivity of <=0.01 uIU/mL. Performed at [facility], [address] [place], [place] [address]    Prolactin 06/13/2023 19.4  3.9 - 22.7 ng/mL Final   Comment: (NOTE) Performed At: [facility] [address] [place], [place] [id number] Jolene Schimke MD UV:[id number]    Color, Urine 06/13/2023 YELLOW  YELLOW Final   APPearance 06/13/2023 CLEAR  CLEAR Final   Specific Gravity, Urine 06/13/2023 1.026  1.005 - 1.030 Final   pH 06/13/2023 5.0  5.0 - 8.0 Final   Glucose, UA 06/13/2023 NEGATIVE  NEGATIVE mg/dL Final   Hgb urine dipstick 06/13/2023 NEGATIVE  NEGATIVE Final   Bilirubin Urine 06/13/2023 NEGATIVE  NEGATIVE Final   Ketones, ur 06/13/2023 NEGATIVE  NEGATIVE mg/dL Final   Protein, ur 74/25/9563 NEGATIVE  NEGATIVE mg/dL Final   Nitrite  87/56/4332 NEGATIVE  NEGATIVE Final   Leukocytes,Ua 06/13/2023 NEGATIVE  NEGATIVE Final   Performed at [facility], [address], [place], [place] [address]   POC Amphetamine UR 06/13/2023 None Detected  NONE DETECTED (Cut Off Level 1000 ng/mL) Final   POC Secobarbital (BAR) 06/13/2023 None Detected  NONE DETECTED (Cut Off Level 300 ng/mL) Final   POC Buprenorphine (BUP) 06/13/2023 None Detected  NONE DETECTED (Cut Off Level 10 ng/mL) Final   POC Oxazepam (BZO) 06/13/2023 None Detected  NONE DETECTED (Cut Off Level 300 ng/mL) Final   POC Cocaine UR 06/13/2023 None Detected  NONE DETECTED (Cut Off Level 300 ng/mL) Final   POC Methamphetamine UR 06/13/2023 None Detected  NONE DETECTED (Cut Off Level 1000 ng/mL) Final   POC Morphine 06/13/2023 None Detected  NONE DETECTED (Cut Off Level 300 ng/mL) Final   POC Methadone UR 06/13/2023 None Detected  NONE DETECTED (Cut Off Level 300 ng/mL) Final   POC Oxycodone UR 06/13/2023 None Detected  NONE DETECTED (Cut Off Level 100 ng/mL) Final   POC Marijuana UR 06/13/2023 Positive (A)  NONE DETECTED (Cut Off Level 50 ng/mL) Final   Cholesterol 06/13/2023 204 (H)  0 - 200 mg/dL Final   Triglycerides 41/66/0630 138  <150 mg/dL Final   HDL 16/12/930 55  >40 mg/dL Final   Total CHOL/HDL Ratio 06/13/2023 3.7  RATIO Final   VLDL 06/13/2023 28  0 - 40 mg/dL Final   LDL Cholesterol 06/13/2023 121 (H)  0 - 99 mg/dL Final   Comment:        Total Cholesterol/HDL:CHD Risk Coronary Heart Disease Risk Table                     Men   Women  1/2 Average Risk   3.4   3.3  Average Risk       5.0   4.4  2 X Average Risk   9.6   7.1  3 X Average Risk  23.4   11.0        Use the calculated Patient Ratio above and the CHD Risk Table to determine the patient's CHD Risk.        ATP III CLASSIFICATION (LDL):  <100     mg/dL  Optimal  100-129  mg/dL   Near or Above                    Optimal  130-159  mg/dL   Borderline  161-096  mg/dL   High  >045      mg/dL   Very High Performed at [facility], [address] [place], [place] [address]   Admission on 06/10/2023, Discharged on 06/11/2023  Component Date Value Ref Range Status   Neisseria Gonorrhea 06/10/2023 Positive (A)   Final   Chlamydia 06/10/2023 Negative   Final   Comment 06/10/2023 Normal Reference Ranger Chlamydia - Negative   Final   Comment 06/10/2023 Normal Reference Range Neisseria Gonorrhea - Negative   Final  Admission on 06/10/2023, Discharged on 06/10/2023  Component Date Value Ref Range Status   Color, Urine 06/10/2023 YELLOW  YELLOW Final   APPearance 06/10/2023 CLEAR  CLEAR Final   Specific Gravity, Urine 06/10/2023 1.026  1.005 - 1.030 Final   pH 06/10/2023 6.0  5.0 - 8.0 Final   Glucose, UA 06/10/2023 NEGATIVE  NEGATIVE mg/dL Final   Hgb urine dipstick 06/10/2023 NEGATIVE  NEGATIVE Final   Bilirubin Urine 06/10/2023 NEGATIVE  NEGATIVE Final   Ketones, ur 06/10/2023 NEGATIVE  NEGATIVE mg/dL Final   Protein, ur 19/14/7829 NEGATIVE  NEGATIVE mg/dL Final   Nitrite 56/21/3086 NEGATIVE  NEGATIVE Final   Leukocytes,Ua 06/10/2023 TRACE (A)  NEGATIVE Final   RBC / HPF 06/10/2023 0-5  0 - 5 RBC/hpf Final   WBC, UA 06/10/2023 11-20  0 - 5 WBC/hpf Final   Bacteria, UA 06/10/2023 RARE (A)  NONE SEEN Final   Squamous Epithelial / HPF 06/10/2023 0-5  0 - 5 /HPF Final   Mucus 06/10/2023 PRESENT   Final   Performed at [facility], [address], [place], [place] [address]   Trichomonas 06/10/2023 Negative   Final   Comment 06/10/2023 Normal Reference Range Trichomonas - Negative   Final    Blood Alcohol level:  Lab Results  Component Value Date   ETH <10 11/26/2023   ETH <10 10/23/2023    Metabolic Disorder Labs: Lab Results  Component Value Date   HGBA1C 5.9 (H) 06/13/2023   MPG 122.63 06/13/2023   MPG 120 03/28/2016   Lab Results  Component Value Date   PROLACTIN 19.4 06/13/2023   Lab Results  Component Value Date   CHOL 148 11/26/2023    TRIG 65 11/26/2023   HDL 43 11/26/2023   CHOLHDL 3.4 11/26/2023   VLDL 13 11/26/2023   LDLCALC 92 11/26/2023   LDLCALC 121 (H) 06/13/2023    Therapeutic Lab Levels: No results found for: "LITHIUM" No results found for: "VALPROATE" No results found for: "CBMZ"  Physical Findings   PHQ2-9    Flowsheet Row Office Visit from 02/04/2022 in [facility] [place]. [facility] Office Visit from 04/16/2021 in [facility] [place]. [facility]  PHQ-2 Total Score 0 0      Flowsheet Row ED from 11/26/2023 in [facility] ED from 10/23/2023 in [facility] ED from 06/23/2023 in [facility]  C-SSRS RISK CATEGORY No Risk No Risk No Risk        Musculoskeletal  Strength & Muscle Tone: within normal limits Gait & Station: normal Patient leans: N/A  Psychiatric Specialty Exam  Presentation  General Appearance:  Appropriate for Environment  Eye Contact: Good  Speech: Clear and Coherent  Speech Volume: Normal  Handedness: Right   Mood and Affect  Mood: Euthymic  Affect: Congruent   Thought Process  Thought Processes: Coherent  Descriptions of Associations:Intact  Orientation:Full (Time, Place and Person)  Thought Content:Logical  Diagnosis of Schizophrenia or Schizoaffective disorder in past: Yes  Duration of Psychotic Symptoms: Greater than six months   Hallucinations:Hallucinations: Other (comment); Auditory (Cant describe the sounds. Says its a lot of thoughts in his head and they wont stop.) Description of Auditory Hallucinations: hears his neck talking to but his neck is not using words  Ideas of Reference:None  Suicidal Thoughts:Suicidal Thoughts: No  Homicidal Thoughts:Homicidal Thoughts: No   Sensorium  Memory: Immediate Fair; Remote  Fair  Judgment: Fair  Insight: Fair   Art therapist  Concentration: Fair  Attention Span: Fair  Recall: [employer] of Knowledge: Fair  Language: Fair   Psychomotor Activity  Psychomotor Activity: Psychomotor Activity: Normal   Assets  Assets: Communication Skills; Desire for Improvement   Sleep  Sleep: Sleep: Good Number of Hours of Sleep: 8   Nutritional Assessment (For OBS and FBC admissions only) Has the patient had a weight loss or gain of 10 pounds or more in the last 3 months?: No Has the patient had a decrease in food intake/or appetite?: Yes Does the patient have dental problems?: No Does the patient have eating habits or behaviors that may be indicators of an eating disorder including binging or inducing vomiting?: No Has the patient recently lost weight without trying?: 0 Has the patient been eating poorly because of a decreased appetite?: 0 Malnutrition Screening Tool Score: 0    Physical Exam  Physical Exam HENT:     Head: Normocephalic.     Nose: Nose normal.  Eyes:     Extraocular Movements: Extraocular movements intact.  Cardiovascular:     Rate and Rhythm: Normal rate.     Pulses: Normal pulses.  Pulmonary:     Effort: Pulmonary effort is normal.  Musculoskeletal:        General: Normal range of motion.     Cervical back: Normal range of motion.  Neurological:     Mental Status: He is alert.    Review of Systems  Constitutional: Negative.   HENT: Negative.    Eyes: Negative.   Respiratory: Negative.    Cardiovascular: Negative.   Gastrointestinal: Negative.   Genitourinary: Negative.   Musculoskeletal: Negative.   Skin: Negative.   Neurological: Negative.   Psychiatric/Behavioral:  Positive for hallucinations.    Blood pressure (!) 103/54, pulse 78, temperature 98.7 F (37.1 C), temperature source Oral, resp. rate 20, SpO2 99%. There is no height or weight on file to calculate BMI.  Treatment Plan  Summary: Patient recommended for inpatient treatment for crisis stabilization, mood stabilization and medication management. Verbal consent obtained.  De Burrs, NP 11/26/2023 6:32 PM

## 2023-11-26 NOTE — ED Notes (Signed)
Pt A&O x 4, resting at present.  Pt requesting to sign out AMA.  RN informed pt that she would notify NP.  Pt calm & cooperative at present.  Monitoring for safety.

## 2023-11-27 ENCOUNTER — Other Ambulatory Visit: Payer: Self-pay

## 2023-11-27 ENCOUNTER — Inpatient Hospital Stay (HOSPITAL_COMMUNITY)
Admission: AD | Admit: 2023-11-27 | Discharge: 2023-12-01 | DRG: 885 | Disposition: A | Discharge status: Home / Self Care | Payer: MEDICAID | Source: Intra-hospital | Attending: Psychiatry | Admitting: Psychiatry

## 2023-11-27 ENCOUNTER — Encounter (HOSPITAL_COMMUNITY): Payer: Self-pay | Admitting: Emergency Medicine

## 2023-11-27 DIAGNOSIS — T50916A Underdosing of multiple unspecified drugs, medicaments and biological substances, initial encounter: Secondary | ICD-10-CM | POA: Diagnosis present

## 2023-11-27 DIAGNOSIS — Z8619 Personal history of other infectious and parasitic diseases: Secondary | ICD-10-CM | POA: Diagnosis not present

## 2023-11-27 DIAGNOSIS — Z5982 Transportation insecurity: Secondary | ICD-10-CM | POA: Diagnosis not present

## 2023-11-27 DIAGNOSIS — Z79899 Other long term (current) drug therapy: Secondary | ICD-10-CM

## 2023-11-27 DIAGNOSIS — B20 Human immunodeficiency virus [HIV] disease: Secondary | ICD-10-CM | POA: Diagnosis present

## 2023-11-27 DIAGNOSIS — F1290 Cannabis use, unspecified, uncomplicated: Secondary | ICD-10-CM | POA: Diagnosis present

## 2023-11-27 DIAGNOSIS — F1210 Cannabis abuse, uncomplicated: Secondary | ICD-10-CM | POA: Diagnosis present

## 2023-11-27 DIAGNOSIS — F410 Panic disorder [episodic paroxysmal anxiety] without agoraphobia: Secondary | ICD-10-CM | POA: Diagnosis present

## 2023-11-27 DIAGNOSIS — Z5941 Food insecurity: Secondary | ICD-10-CM | POA: Diagnosis not present

## 2023-11-27 DIAGNOSIS — F259 Schizoaffective disorder, unspecified: Principal | ICD-10-CM | POA: Diagnosis present

## 2023-11-27 DIAGNOSIS — F250 Schizoaffective disorder, bipolar type: Principal | ICD-10-CM | POA: Diagnosis present

## 2023-11-27 DIAGNOSIS — J45909 Unspecified asthma, uncomplicated: Secondary | ICD-10-CM | POA: Diagnosis present

## 2023-11-27 DIAGNOSIS — F1911 Other psychoactive substance abuse, in remission: Secondary | ICD-10-CM | POA: Diagnosis present

## 2023-11-27 DIAGNOSIS — T43596A Underdosing of other antipsychotics and neuroleptics, initial encounter: Secondary | ICD-10-CM | POA: Diagnosis present

## 2023-11-27 DIAGNOSIS — Z91128 Patient's intentional underdosing of medication regimen for other reason: Secondary | ICD-10-CM

## 2023-11-27 DIAGNOSIS — F1520 Other stimulant dependence, uncomplicated: Secondary | ICD-10-CM | POA: Diagnosis present

## 2023-11-27 DIAGNOSIS — Z602 Problems related to living alone: Secondary | ICD-10-CM | POA: Diagnosis present

## 2023-11-27 DIAGNOSIS — Z560 Unemployment, unspecified: Secondary | ICD-10-CM

## 2023-11-27 DIAGNOSIS — F4310 Post-traumatic stress disorder, unspecified: Principal | ICD-10-CM | POA: Diagnosis present

## 2023-11-27 DIAGNOSIS — F251 Schizoaffective disorder, depressive type: Secondary | ICD-10-CM | POA: Diagnosis not present

## 2023-11-27 MED ORDER — ZIPRASIDONE MESYLATE 20 MG IM SOLR: 20.0000 mg | INTRAMUSCULAR | Status: DC | PRN

## 2023-11-27 MED ORDER — BENZTROPINE MESYLATE 0.5 MG PO TABS
0.5000 mg | ORAL_TABLET | Freq: Every day | ORAL | Status: DC
  Administered 2023-11-27: 0.5 mg via ORAL
  Filled 2023-11-27 (×4): qty 1

## 2023-11-27 MED ORDER — OLANZAPINE 10 MG IM SOLR: 10.0000 mg | Freq: Four times a day (QID) | INTRAMUSCULAR | Status: DC | PRN

## 2023-11-27 MED ORDER — LORAZEPAM 1 MG PO TABS: 1.0000 mg | ORAL_TABLET | ORAL | Status: DC | PRN

## 2023-11-27 MED ORDER — BICTEGRAVIR-EMTRICITAB-TENOFOV 50-200-25 MG PO TABS
1.0000 | ORAL_TABLET | Freq: Every day | ORAL | Status: DC
  Filled 2023-11-27 (×3): qty 1

## 2023-11-27 MED ORDER — OLANZAPINE 5 MG PO TBDP: 5.0000 mg | ORAL_TABLET | Freq: Four times a day (QID) | ORAL | Status: DC | PRN

## 2023-11-27 MED ORDER — ARIPIPRAZOLE 10 MG PO TABS
10.0000 mg | ORAL_TABLET | Freq: Every day | ORAL | Status: DC
  Administered 2023-11-27: 10 mg via ORAL
  Filled 2023-11-27 (×4): qty 1

## 2023-11-27 MED ORDER — BENZTROPINE MESYLATE 0.5 MG PO TABS: 0.5000 mg | ORAL_TABLET | Freq: Every day | ORAL | Status: DC

## 2023-11-27 MED ORDER — OLANZAPINE 10 MG PO TBDP
10.0000 mg | ORAL_TABLET | Freq: Three times a day (TID) | ORAL | Status: DC | PRN
  Administered 2023-11-27: 10 mg via ORAL
  Filled 2023-11-27: qty 1

## 2023-11-27 MED ORDER — TRAZODONE HCL 50 MG PO TABS: 50.0000 mg | ORAL_TABLET | Freq: Every evening | ORAL | Status: DC | PRN

## 2023-11-27 MED ORDER — TRAZODONE HCL 50 MG PO TABS
50.0000 mg | ORAL_TABLET | Freq: Every evening | ORAL | Status: DC | PRN
  Administered 2023-11-27: 50 mg via ORAL
  Filled 2023-11-27 (×3): qty 1

## 2023-11-27 MED ORDER — LORAZEPAM 2 MG/ML IJ SOLN: 2.0000 mg | Freq: Three times a day (TID) | INTRAMUSCULAR | Status: DC | PRN

## 2023-11-27 MED ORDER — DIPHENHYDRAMINE HCL 50 MG/ML IJ SOLN: 50.0000 mg | Freq: Three times a day (TID) | INTRAMUSCULAR | Status: DC | PRN

## 2023-11-27 MED ORDER — HALOPERIDOL LACTATE 5 MG/ML IJ SOLN: 5.0000 mg | Freq: Three times a day (TID) | INTRAMUSCULAR | Status: DC | PRN

## 2023-11-27 MED ORDER — ARIPIPRAZOLE 10 MG PO TABS: 10.0000 mg | ORAL_TABLET | Freq: Every day | ORAL | Status: DC

## 2023-11-27 NOTE — Group Note (Signed)
Date:  11/27/2023 Time:  9:38 PM  Group Topic/Focus:  Wrap-Up Group:   The focus of this group is to help patients review their daily goal of treatment and discuss progress on daily workbooks.    Participation Level:  Did Not Attend   Scot Dock 11/27/2023, 9:38 PM

## 2023-11-27 NOTE — ED Notes (Signed)
Safe transport called to transport to [facility]. Will continue to monitor for safety and report any COC.

## 2023-11-27 NOTE — ED Notes (Signed)
Patient d/c with all belongings inh stable condition to [facility] via [employer]. Patient was aware and agreeable to going to [facility]. Patient denies SI, HI, AVH. Patient calm and cooperative at discharge.

## 2023-11-27 NOTE — ED Notes (Signed)
Pt sleeping at present, no distress noted.  Monitoring for safety. 

## 2023-11-27 NOTE — Discharge Instructions (Signed)
Transfer to [facility] 

## 2023-11-27 NOTE — ED Provider Notes (Signed)
Behavioral Health Progress Note/discharge Note  Date and Time: 11/27/2023 7:56 PM Name: Kenneth Welch. MRN:  [id number]  Subjective:  I am ready to leave, I don't want to be here any more , I am ok now"  Diagnosis:  Final diagnoses:  Schizoaffective disorder, unspecified type (HCC)  Kenneth Becton., 37 y.o., male patient seen face to face by this provider, consulted with Dr. Viviano Simas; and chart reviewed on 11/26/23.  On evaluation Kenneth Abler. reports continuation of auditory hallucination.  Patient states the hallucinations are better meaning they are not as loud.  Patient states that his mind continues to be filled so many thoughts it is hard for him to think. Patient states he has no family or close friends thus no collateral could be obtained.   During evaluation Kenneth Becton. is laying down in no acute distress.  He is alert, oriented x 3, calm, cooperative and attentive.  His mood is euthymic with congruent affect.  He has normal speech, and behavior.  Objectively there is no evidence of psychosis.  Patient is able to converse coherently, goal directed thoughts, no distractibility, or pre-occupation.  He also denies suicidal/self-harm/homicidal ideation, and paranoia.  Patient answered question appropriately   Total Time spent with patient: 20 minutes  Past Psychiatric History: : Patient has past psychiatric history of schizoaffective disorder Past Medical History: HIV and asthma Family History: Patient's parents are deceased there is no pertinent family medical history provided Family Psychiatric  History: No pertinent family psychiatric history provided Social History: : Patient lives alone.  Patient has an estranged sister and no other family  Additional Social History:    Pain Medications: See MAR Prescriptions: See MAR Over the Counter: See MAR History of alcohol / drug use?: Yes (occasional marijuana) Longest period of sobriety  (when/how long): Unknown Negative Consequences of Use:  (N/A) Withdrawal Symptoms: None                    Sleep: Good  Appetite:  Good  Current Medications:  No current facility-administered medications for this encounter.   No current outpatient medications on file.   Facility-Administered Medications Ordered in Other Encounters  Medication Dose Route Frequency Provider Last Rate Last Admin   ARIPiprazole (ABILIFY) tablet 10 mg  10 mg Oral QHS Miller-Almeida, Carlena Hurl, NP       benztropine (COGENTIN) tablet 0.5 mg  0.5 mg Oral QHS Miller-Almeida, Carlena Hurl, NP       [START ON 11/28/2023] bictegravir-emtricitabine-tenofovir AF (BIKTARVY) 50-200-25 MG per tablet 1 tablet  1 tablet Oral Daily Miller-Almeida, Carlena Hurl, NP       haloperidol lactate (HALDOL) injection 5 mg  5 mg Intramuscular TID PRN Miller-Almeida, Carlena Hurl, NP       And   diphenhydrAMINE (BENADRYL) injection 50 mg  50 mg Intramuscular TID PRN Miller-Almeida, Carlena Hurl, NP       And   LORazepam (ATIVAN) injection 2 mg  2 mg Intramuscular TID PRN Miller-Almeida, Carlena Hurl, NP       traZODone (DESYREL) tablet 50 mg  50 mg Oral QHS PRN Miller-Almeida, Carlena Hurl, NP        Labs  Lab Results:  Admission on 11/26/2023, Discharged on 11/27/2023  Component Date Value Ref Range Status   WBC 11/26/2023 6.5  4.0 - 10.5 K/uL Final   RBC 11/26/2023 4.17 (L)  4.22 - 5.81 MIL/uL Final   Hemoglobin 11/26/2023 12.7 (L)  13.0 - 17.0  g/dL Final   HCT 03/47/4259 38.7 (L)  39.0 - 52.0 % Final   MCV 11/26/2023 92.8  80.0 - 100.0 fL Final   MCH 11/26/2023 30.5  26.0 - 34.0 pg Final   MCHC 11/26/2023 32.8  30.0 - 36.0 g/dL Final   RDW 56/38/7564 14.4  11.5 - 15.5 % Final   Platelets 11/26/2023 229  150 - 400 K/uL Final   nRBC 11/26/2023 0.0  0.0 - 0.2 % Final   Neutrophils Relative % 11/26/2023 55  % Final   Neutro Abs 11/26/2023 3.6  1.7 - 7.7 K/uL Final   Lymphocytes Relative 11/26/2023 32  % Final   Lymphs Abs 11/26/2023 2.1  0.7 -  4.0 K/uL Final   Monocytes Relative 11/26/2023 7  % Final   Monocytes Absolute 11/26/2023 0.4  0.1 - 1.0 K/uL Final   Eosinophils Relative 11/26/2023 5  % Final   Eosinophils Absolute 11/26/2023 0.3  0.0 - 0.5 K/uL Final   Basophils Relative 11/26/2023 1  % Final   Basophils Absolute 11/26/2023 0.0  0.0 - 0.1 K/uL Final   Immature Granulocytes 11/26/2023 0  % Final   Abs Immature Granulocytes 11/26/2023 0.01  0.00 - 0.07 K/uL Final   Performed at [facility], [address], [place], [place] [address]   Sodium 11/26/2023 140  135 - 145 mmol/L Final   Potassium 11/26/2023 4.2  3.5 - 5.1 mmol/L Final   Chloride 11/26/2023 111  98 - 111 mmol/L Final   CO2 11/26/2023 25  22 - 32 mmol/L Final   Glucose, Bld 11/26/2023 86  70 - 99 mg/dL Final   Glucose reference range applies only to samples taken after fasting for at least 8 hours.   BUN 11/26/2023 11  6 - 20 mg/dL Final   Creatinine, Ser 11/26/2023 0.93  0.61 - 1.24 mg/dL Final   Calcium 18/84/1660 9.0  8.9 - 10.3 mg/dL Final   Total Protein 63/12/6008 6.5  6.5 - 8.1 g/dL Final   Albumin 93/23/5573 3.6  3.5 - 5.0 g/dL Final   AST 22/01/5426 36  15 - 41 U/L Final   ALT 11/26/2023 25  0 - 44 U/L Final   Alkaline Phosphatase 11/26/2023 51  38 - 126 U/L Final   Total Bilirubin 11/26/2023 0.5  <1.2 mg/dL Final   GFR, Estimated 11/26/2023 >60  >60 mL/min Final   Comment: (NOTE) Calculated using the CKD-EPI Creatinine Equation (2021)    Anion gap 11/26/2023 4 (L)  5 - 15 Final   Performed at [facility], [address], [place], [place] [address]   Alcohol, Ethyl (B) 11/26/2023 <10  <10 mg/dL Final   Comment: (NOTE) Lowest detectable limit for serum alcohol is 10 mg/dL.  For medical purposes only. Performed at [facility], [address], [place], [place] [address]    POC Amphetamine UR 11/26/2023 None Detected  NONE DETECTED (Cut Off Level 1000 ng/mL) Final   POC Secobarbital (BAR) 11/26/2023 None Detected  NONE  DETECTED (Cut Off Level 300 ng/mL) Final   POC Buprenorphine (BUP) 11/26/2023 None Detected  NONE DETECTED (Cut Off Level 10 ng/mL) Final   POC Oxazepam (BZO) 11/26/2023 None Detected  NONE DETECTED (Cut Off Level 300 ng/mL) Final   POC Cocaine UR 11/26/2023 None Detected  NONE DETECTED (Cut Off Level 300 ng/mL) Final   POC Methamphetamine UR 11/26/2023 None Detected  NONE DETECTED (Cut Off Level 1000 ng/mL) Final   POC Morphine 11/26/2023 None Detected  NONE DETECTED (Cut Off Level 300 ng/mL) Final   POC Methadone UR 11/26/2023 None Detected  NONE DETECTED (Cut Off Level 300 ng/mL) Final   POC Oxycodone UR 11/26/2023 None Detected  NONE DETECTED (Cut Off Level 100 ng/mL) Final   POC Marijuana UR 11/26/2023 None Detected  NONE DETECTED (Cut Off Level 50 ng/mL) Final   Cholesterol 11/26/2023 148  0 - 200 mg/dL Final   Triglycerides 16/09/9603 65  <150 mg/dL Final   HDL 54/08/8118 43  >40 mg/dL Final   Total CHOL/HDL Ratio 11/26/2023 3.4  RATIO Final   VLDL 11/26/2023 13  0 - 40 mg/dL Final   LDL Cholesterol 11/26/2023 92  0 - 99 mg/dL Final   Comment:        Total Cholesterol/HDL:CHD Risk Coronary Heart Disease Risk Table                     Men   Women  1/2 Average Risk   3.4   3.3  Average Risk       5.0   4.4  2 X Average Risk   9.6   7.1  3 X Average Risk  23.4   11.0        Use the calculated Patient Ratio above and the CHD Risk Table to determine the patient's CHD Risk.        ATP III CLASSIFICATION (LDL):  <100     mg/dL   Optimal  147-829  mg/dL   Near or Above                    Optimal  130-159  mg/dL   Borderline  562-130  mg/dL   High  >865     mg/dL   Very High Performed at [facility], [address] [place], [place] [address]   Admission on 10/23/2023, Discharged on 10/23/2023  Component Date Value Ref Range Status   Sodium 10/23/2023 134 (L)  135 - 145 mmol/L Final   Potassium 10/23/2023 4.4  3.5 - 5.1 mmol/L Final   Chloride 10/23/2023 106  98 - 111  mmol/L Final   CO2 10/23/2023 18 (L)  22 - 32 mmol/L Final   Glucose, Bld 10/23/2023 87  70 - 99 mg/dL Final   Glucose reference range applies only to samples taken after fasting for at least 8 hours.   BUN 10/23/2023 10  6 - 20 mg/dL Final   Creatinine, Ser 10/23/2023 1.06  0.61 - 1.24 mg/dL Final   Calcium 62/95/2841 9.1  8.9 - 10.3 mg/dL Final   Total Protein 32/44/0102 7.6  6.5 - 8.1 g/dL Final   Albumin 72/53/6644 3.9  3.5 - 5.0 g/dL Final   AST 03/47/4259 29  15 - 41 U/L Final   ALT 10/23/2023 16  0 - 44 U/L Final   Alkaline Phosphatase 10/23/2023 51  38 - 126 U/L Final   Total Bilirubin 10/23/2023 0.6  <1.2 mg/dL Final   GFR, Estimated 10/23/2023 >60  >60 mL/min Final   Comment: (NOTE) Calculated using the CKD-EPI Creatinine Equation (2021)    Anion gap 10/23/2023 10  5 - 15 Final   Performed at [facility], [address], [place], [place] [address]   Alcohol, Ethyl (B) 10/23/2023 <10  <10 mg/dL Final   Comment: (NOTE) Lowest detectable limit for serum alcohol is 10 mg/dL.  For medical purposes only. Performed at [facility], [address] [place], [place]  [address]    Opiates 10/23/2023 NONE DETECTED  NONE DETECTED Final   Cocaine 10/23/2023 NONE DETECTED  NONE DETECTED Final   Benzodiazepines 10/23/2023 NONE DETECTED  NONE DETECTED Final   Amphetamines 10/23/2023 NONE DETECTED  NONE DETECTED Final   Tetrahydrocannabinol 10/23/2023 NONE DETECTED  NONE DETECTED Final   Barbiturates 10/23/2023 NONE DETECTED  NONE DETECTED Final   Comment: (NOTE) DRUG SCREEN FOR MEDICAL PURPOSES ONLY.  IF CONFIRMATION IS NEEDED FOR ANY PURPOSE, NOTIFY LAB WITHIN 5 DAYS.  LOWEST DETECTABLE LIMITS FOR URINE DRUG SCREEN Drug Class                     Cutoff (ng/mL) Amphetamine and metabolites    1000 Barbiturate and metabolites    200 Benzodiazepine                 200 Opiates and metabolites        300 Cocaine and metabolites        300 THC                             50 Performed at [facility], [address] [place], [place] [address]    WBC 10/23/2023 4.8  4.0 - 10.5 K/uL Final   RBC 10/23/2023 4.96  4.22 - 5.81 MIL/uL Final   Hemoglobin 10/23/2023 15.2  13.0 - 17.0 g/dL Final   HCT 09/81/1914 46.2  39.0 - 52.0 % Final   MCV 10/23/2023 93.1  80.0 - 100.0 fL Final   MCH 10/23/2023 30.6  26.0 - 34.0 pg Final   MCHC 10/23/2023 32.9  30.0 - 36.0 g/dL Final   RDW 78/29/5621 14.4  11.5 - 15.5 % Final   Platelets 10/23/2023 225  150 - 400 K/uL Final   nRBC 10/23/2023 0.0  0.0 - 0.2 % Final   Neutrophils Relative % 10/23/2023 40  % Final   Neutro Abs 10/23/2023 1.9  1.7 - 7.7 K/uL Final   Lymphocytes Relative 10/23/2023 44  % Final   Lymphs Abs 10/23/2023 2.2  0.7 - 4.0 K/uL Final   Monocytes Relative 10/23/2023 9  % Final   Monocytes Absolute 10/23/2023 0.4  0.1 - 1.0 K/uL Final   Eosinophils Relative 10/23/2023 6  % Final   Eosinophils Absolute 10/23/2023 0.3  0.0 - 0.5 K/uL Final   Basophils Relative 10/23/2023 1  % Final   Basophils Absolute 10/23/2023 0.0  0.0 - 0.1 K/uL Final   Immature Granulocytes 10/23/2023 0  % Final   Abs Immature Granulocytes 10/23/2023 0.01  0.00 - 0.07 K/uL Final   Performed at [facility], [address] [place], [place] [address]   Total lymphocyte count 10/23/2023 2,194  1,000 - 4,000 /uL Final   CD4% 10/23/2023 26.20 (L)  33 - 65 % Final   CD4 absolute 10/23/2023 575  400 - 1,790 /uL Final   CD8tox 10/23/2023 34.30  12 - 40 % Final   CD8 T Cell Abs 10/23/2023 753  190 - 1,000 /uL Final   Ratio 10/23/2023 0.76 (L)  1.0 - 3.0 Final   Performed at [facility], [address], [place], [place] [address]  Admission on 06/13/2023, Discharged on 06/14/2023  Component Date Value Ref Range Status   WBC 06/13/2023 7.8  4.0 - 10.5 K/uL Final   RBC 06/13/2023 4.84  4.22 - 5.81 MIL/uL Final   Hemoglobin 06/13/2023 14.6  13.0 -  17.0 g/dL Final   HCT 16/09/9603 45.0  39.0 - 52.0 % Final    MCV 06/13/2023 93.0  80.0 - 100.0 fL Final   MCH 06/13/2023 30.2  26.0 - 34.0 pg Final   MCHC 06/13/2023 32.4  30.0 - 36.0 g/dL Final   RDW 54/08/8118 15.0  11.5 - 15.5 % Final   Platelets 06/13/2023 249  150 - 400 K/uL Final   nRBC 06/13/2023 0.0  0.0 - 0.2 % Final   Neutrophils Relative % 06/13/2023 48  % Final   Neutro Abs 06/13/2023 3.7  1.7 - 7.7 K/uL Final   Lymphocytes Relative 06/13/2023 43  % Final   Lymphs Abs 06/13/2023 3.4  0.7 - 4.0 K/uL Final   Monocytes Relative 06/13/2023 6  % Final   Monocytes Absolute 06/13/2023 0.5  0.1 - 1.0 K/uL Final   Eosinophils Relative 06/13/2023 2  % Final   Eosinophils Absolute 06/13/2023 0.2  0.0 - 0.5 K/uL Final   Basophils Relative 06/13/2023 1  % Final   Basophils Absolute 06/13/2023 0.0  0.0 - 0.1 K/uL Final   Immature Granulocytes 06/13/2023 0  % Final   Abs Immature Granulocytes 06/13/2023 0.01  0.00 - 0.07 K/uL Final   Performed at [facility], [address] [place], [place] [address]   Sodium 06/13/2023 138  135 - 145 mmol/L Final   Potassium 06/13/2023 4.5  3.5 - 5.1 mmol/L Final   Chloride 06/13/2023 99  98 - 111 mmol/L Final   CO2 06/13/2023 28  22 - 32 mmol/L Final   Glucose, Bld 06/13/2023 85  70 - 99 mg/dL Final   Glucose reference range applies only to samples taken after fasting for at least 8 hours.   BUN 06/13/2023 13  6 - 20 mg/dL Final   Creatinine, Ser 06/13/2023 1.06  0.61 - 1.24 mg/dL Final   Calcium 95/62/1308 9.9  8.9 - 10.3 mg/dL Final   Total Protein 65/78/4696 8.4 (H)  6.5 - 8.1 g/dL Final   Albumin 29/52/8413 4.5  3.5 - 5.0 g/dL Final   AST 24/40/1027 21  15 - 41 U/L Final   ALT 06/13/2023 17  0 - 44 U/L Final   Alkaline Phosphatase 06/13/2023 55  38 - 126 U/L Final   Total Bilirubin 06/13/2023 0.5  0.3 - 1.2 mg/dL Final   GFR, Estimated 06/13/2023 >60  >60 mL/min Final   Comment: (NOTE) Calculated using the CKD-EPI Creatinine Equation (2021)    Anion gap 06/13/2023 11  5 - 15 Final   Performed  at [facility], [address], [place], [place] [address]   Hgb A1c MFr Bld 06/13/2023 5.9 (H)  4.8 - 5.6 % Final   Comment: (NOTE) Pre diabetes:          5.7%-6.4%  Diabetes:              >6.4%  Glycemic control for   <7.0% adults with diabetes    Mean Plasma Glucose 06/13/2023 122.63  mg/dL Final   Performed at [facility], [address], [place], [place] [address]   Magnesium 06/13/2023 2.3  1.7 - 2.4 mg/dL Final   Performed at [facility], [address] [place], [place] [address]   Alcohol, Ethyl (B) 06/13/2023 <10  <10 mg/dL Final   Comment: (NOTE) Lowest detectable limit for serum alcohol is 10 mg/dL.  For medical purposes only. Performed at [facility], [address] [place], [place]  [address]    TSH 06/13/2023 3.021  0.350 - 4.500 uIU/mL Final   Comment: Performed by a 3rd Generation assay with a functional sensitivity of <=0.01 uIU/mL. Performed at [facility], [address] [place], [place] [address]    Prolactin 06/13/2023 19.4  3.9 - 22.7 ng/mL Final   Comment: (NOTE) Performed At: [facility] [address] [place], [place] [id number] Jolene Schimke MD QQ:[id number]    Color, Urine 06/13/2023 YELLOW  YELLOW Final   APPearance 06/13/2023 CLEAR  CLEAR Final   Specific Gravity, Urine 06/13/2023 1.026  1.005 - 1.030 Final   pH 06/13/2023 5.0  5.0 - 8.0 Final   Glucose, UA 06/13/2023 NEGATIVE  NEGATIVE mg/dL Final   Hgb urine dipstick 06/13/2023 NEGATIVE  NEGATIVE Final   Bilirubin Urine 06/13/2023 NEGATIVE  NEGATIVE Final   Ketones, ur 06/13/2023 NEGATIVE  NEGATIVE mg/dL Final   Protein, ur 33/29/5188 NEGATIVE  NEGATIVE mg/dL Final   Nitrite 41/66/0630 NEGATIVE  NEGATIVE Final   Leukocytes,Ua 06/13/2023 NEGATIVE  NEGATIVE Final   Performed at [facility], [address], [place], [place] [address]   POC Amphetamine UR 06/13/2023 None Detected  NONE DETECTED (Cut Off Level 1000 ng/mL) Final   POC  Secobarbital (BAR) 06/13/2023 None Detected  NONE DETECTED (Cut Off Level 300 ng/mL) Final   POC Buprenorphine (BUP) 06/13/2023 None Detected  NONE DETECTED (Cut Off Level 10 ng/mL) Final   POC Oxazepam (BZO) 06/13/2023 None Detected  NONE DETECTED (Cut Off Level 300 ng/mL) Final   POC Cocaine UR 06/13/2023 None Detected  NONE DETECTED (Cut Off Level 300 ng/mL) Final   POC Methamphetamine UR 06/13/2023 None Detected  NONE DETECTED (Cut Off Level 1000 ng/mL) Final   POC Morphine 06/13/2023 None Detected  NONE DETECTED (Cut Off Level 300 ng/mL) Final   POC Methadone UR 06/13/2023 None Detected  NONE DETECTED (Cut Off Level 300 ng/mL) Final   POC Oxycodone UR 06/13/2023 None Detected  NONE DETECTED (Cut Off Level 100 ng/mL) Final   POC Marijuana UR 06/13/2023 Positive (A)  NONE DETECTED (Cut Off Level 50 ng/mL) Final   Cholesterol 06/13/2023 204 (H)  0 - 200 mg/dL Final   Triglycerides 93/23/5573 138  <150 mg/dL Final   HDL 22/01/5426 55  >40 mg/dL Final   Total CHOL/HDL Ratio 06/13/2023 3.7  RATIO Final   VLDL 06/13/2023 28  0 - 40 mg/dL Final   LDL Cholesterol 06/13/2023 121 (H)  0 - 99 mg/dL Final   Comment:        Total Cholesterol/HDL:CHD Risk Coronary Heart Disease Risk Table                     Men   Women  1/2 Average Risk   3.4   3.3  Average Risk       5.0   4.4  2 X Average Risk   9.6   7.1  3 X Average Risk  23.4   11.0        Use the calculated Patient Ratio above and the CHD Risk Table to determine the patient's CHD Risk.        ATP III CLASSIFICATION (LDL):  <100     mg/dL   Optimal  062-376  mg/dL   Near or Above                    Optimal  130-159  mg/dL   Borderline  283-151  mg/dL  High  >190     mg/dL   Very High Performed at [facility], [address] [place], [place] [address]   Admission on 06/10/2023, Discharged on 06/11/2023  Component Date Value Ref Range Status   Neisseria Gonorrhea 06/10/2023 Positive (A)   Final   Chlamydia 06/10/2023  Negative   Final   Comment 06/10/2023 Normal Reference Ranger Chlamydia - Negative   Final   Comment 06/10/2023 Normal Reference Range Neisseria Gonorrhea - Negative   Final  Admission on 06/10/2023, Discharged on 06/10/2023  Component Date Value Ref Range Status   Color, Urine 06/10/2023 YELLOW  YELLOW Final   APPearance 06/10/2023 CLEAR  CLEAR Final   Specific Gravity, Urine 06/10/2023 1.026  1.005 - 1.030 Final   pH 06/10/2023 6.0  5.0 - 8.0 Final   Glucose, UA 06/10/2023 NEGATIVE  NEGATIVE mg/dL Final   Hgb urine dipstick 06/10/2023 NEGATIVE  NEGATIVE Final   Bilirubin Urine 06/10/2023 NEGATIVE  NEGATIVE Final   Ketones, ur 06/10/2023 NEGATIVE  NEGATIVE mg/dL Final   Protein, ur 76/28/3151 NEGATIVE  NEGATIVE mg/dL Final   Nitrite 76/16/0737 NEGATIVE  NEGATIVE Final   Leukocytes,Ua 06/10/2023 TRACE (A)  NEGATIVE Final   RBC / HPF 06/10/2023 0-5  0 - 5 RBC/hpf Final   WBC, UA 06/10/2023 11-20  0 - 5 WBC/hpf Final   Bacteria, UA 06/10/2023 RARE (A)  NONE SEEN Final   Squamous Epithelial / HPF 06/10/2023 0-5  0 - 5 /HPF Final   Mucus 06/10/2023 PRESENT   Final   Performed at [facility], [address], [place], [place] [address]   Trichomonas 06/10/2023 Negative   Final   Comment 06/10/2023 Normal Reference Range Trichomonas - Negative   Final    Blood Alcohol level:  Lab Results  Component Value Date   ETH <10 11/26/2023   ETH <10 10/23/2023    Metabolic Disorder Labs: Lab Results  Component Value Date   HGBA1C 5.9 (H) 06/13/2023   MPG 122.63 06/13/2023   MPG 120 03/28/2016   Lab Results  Component Value Date   PROLACTIN 19.4 06/13/2023   Lab Results  Component Value Date   CHOL 148 11/26/2023   TRIG 65 11/26/2023   HDL 43 11/26/2023   CHOLHDL 3.4 11/26/2023   VLDL 13 11/26/2023   LDLCALC 92 11/26/2023   LDLCALC 121 (H) 06/13/2023    Therapeutic Lab Levels: No results found for: "LITHIUM" No results found for: "VALPROATE" No results found for:  "CBMZ"  Physical Findings   AUDIT    Flowsheet Row [facility]  Alcohol Use Disorder Identification Test Final Score (AUDIT) 0      PHQ2-9    Flowsheet Row Office Visit from [facility]/04/2022 in [facility] [place] Office Visit from 04[facility]16/2021 in [facility] [place]  PHQ-2 Total Score 0 0      Flowsheet Row Ad[facility] (Current) [facility] from [facility]/2023 in [facility] ED from 10/23/2023 in [facility] at [facility]  C-SSRS RISK CATEGORY No Risk No Risk No Risk        Musculoskeletal  Strength & Muscle Tone: within normal limits Gait & Station: normal Patient leans: N/A  Psychiatric Specialty Exam  Presentation  General Appearance:  Appropriate for Environment  Eye Contact: Good  Speech: Clear and Coherent  Speech Volume: Normal  Handedness: Right   Mood and Affect  Mood: Angry; Depressed  Affect: Congruent   Thought Process  Thought Processes: Coherent  Descriptions of Associations:Intact  Orientation:Full (Time, Place and Person)  Thought Content:Logical  Diagnosis of Schizophrenia or Schizoaffective disorder in past: Yes  Duration of Psychotic Symptoms: Greater than six months   Hallucinations:Hallucinations: Auditory Description of Auditory Hallucinations: hears his neck talking to but his neck is not using words  Ideas of Reference:None  Suicidal Thoughts:Suicidal Thoughts: No  Homicidal Thoughts:Homicidal Thoughts: No   Sensorium  Memory: Immediate Fair; Recent Fair; Remote Fair  Judgment: Fair  Insight: Fair   Art therapist  Concentration: Fair  Attention Span: Fair  Recall: [employer] of  Knowledge: Fair  Language: Fair   Psychomotor Activity  Psychomotor Activity: Psychomotor Activity: Normal   Assets  Assets: Communication Skills; Desire for Improvement; Physical Health   Sleep  Sleep: Sleep: Good Number of Hours of Sleep: 8   Nutritional Assessment (For OBS and FBC admissions only) Has the patient had a weight loss or gain of 10 pounds or more in the last 3 months?: No Has the patient had a decrease in food intake/or appetite?: No Does the patient have dental problems?: No Does the patient have eating habits or behaviors that may be indicators of an eating disorder including binging or inducing vomiting?: No Has the patient recently lost weight without trying?: 1 Has the patient been eating poorly because of a decreased appetite?: 0 Malnutrition Screening Tool Score: 1    Physical Exam  Physical Exam HENT:     Head: Normocephalic.  Cardiovascular:     Rate and Rhythm: Normal rate.  Pulmonary:     Effort: Pulmonary effort is normal.  Neurological:     Mental Status: He is alert.  Review of Systems  Constitutional:  Negative for fever.  HENT:  Negative for hearing loss.   Psychiatric/Behavioral:  Negative for depression and hallucinations.   Blood pressure 123/77, pulse 78, temperature 98 F (36.7 C), resp. rate 18, SpO2 100%. There is no height or weight on file to calculate BMI.  Treatment Plan Summary: Plan Patient has been discharged to [facility] Initially, he refused to go but after this provider spoke to him he agreed to go to [facility].   Donato Heinz, NP 11/27/2023 7:56 PM

## 2023-11-27 NOTE — ED Notes (Signed)
Patient A&O x 4, calm, cooperative with a anxious affect. Speech logical and coherent. Patient states he is thinking much clearer now. Patient denies SI, HI, AVH. Patient states he is ready to get his life back together, attend school. And gain employment. Patient does not appear to be responding to internal stimuli.

## 2023-11-27 NOTE — Progress Notes (Signed)
Pt has been accepted to [facility] on 11/27/2023 Bed assignment: 505-1  Pt meets inpatient criteria per: Wendi Maya NP   Attending Physician will be: Phineas Inches, MD   Report can be called to:Adult unit: [phone]  Pt can arrive after Discharges  Care Team Notified: [facility] Rona Ravens RN, [address] RN, Wendi Maya NP,     [place] Qadir Folks LCSW-A   11/27/2023 12:03 PM

## 2023-11-27 NOTE — Plan of Care (Signed)
  Problem: Education: Goal: Knowledge of [facility] Education information/materials will improve Outcome: Progressing Goal: Emotional status will improve Outcome: Progressing Goal: Mental status will improve Outcome: Progressing   

## 2023-11-27 NOTE — ED Notes (Signed)
Patient woke up and ran to the door. He began to kick and bang on the door yelling to let him out of here. It took staff several attempts to redirect the behavior. Once redirected, the patient went and laid back down in his bed. The patient was given 10 mg Zyprexa ODT. Patient states he wants to go home calmly. The writer informed the patient that the facility is waiting for his bed to be ready at [facility]. The patient expressed understanding.

## 2023-11-27 NOTE — Progress Notes (Signed)
Pt was transferred from [facility] this afternoon. A&O X4, calm and cooperative with admission, denied any new pain, denied vh/hi/si, reports he is here due to recent stressors related to eviction and recent HIV/AIDs diagnosis. No behavioral problems, skin intact, oriented to unit. He is being monitored as ordered.

## 2023-11-27 NOTE — Progress Notes (Signed)
   11/27/23 2100  Psych Admission Type (Psych Patients Only)  Admission Status Voluntary  Psychosocial Assessment  Patient Complaints Anxiety;Sadness  Eye Contact Poor  Facial Expression Anxious;Sad  Affect Anxious  Speech Logical/coherent  Interaction Minimal  Motor Activity Slow  Appearance/Hygiene Poor hygiene  Behavior Characteristics Cooperative;Anxious  Mood Anxious;Sad  Thought Process  Coherency WDL  Content WDL  Delusions None reported or observed  Perception Hallucinations  Hallucination Auditory  Judgment Poor  Confusion None  Danger to Self  Current suicidal ideation? Denies  Agreement Not to Harm Self Yes  Description of Agreement Verbal  Danger to Others  Danger to Others None reported or observed

## 2023-11-28 DIAGNOSIS — F251 Schizoaffective disorder, depressive type: Secondary | ICD-10-CM | POA: Diagnosis not present

## 2023-11-28 MED ORDER — BICTEGRAVIR-EMTRICITAB-TENOFOV 50-200-25 MG PO TABS
1.0000 | ORAL_TABLET | Freq: Every morning | ORAL | Status: DC
  Administered 2023-11-29 – 2023-12-01 (×3): 1 via ORAL
  Filled 2023-11-28 (×4): qty 1

## 2023-11-28 MED ORDER — BICTEGRAVIR-EMTRICITAB-TENOFOV 50-200-25 MG PO TABS
1.0000 | ORAL_TABLET | Freq: Once | ORAL | Status: AC
  Administered 2023-11-28: 1 via ORAL
  Filled 2023-11-28: qty 1

## 2023-11-28 MED ORDER — ARIPIPRAZOLE 10 MG PO TABS
10.0000 mg | ORAL_TABLET | Freq: Every morning | ORAL | Status: DC
  Administered 2023-11-29 – 2023-12-01 (×3): 10 mg via ORAL
  Filled 2023-11-28 (×4): qty 1

## 2023-11-28 NOTE — BHH Counselor (Signed)
Adult Comprehensive Assessment  Patient ID: Kenneth Khoury., male   DOB: Feb 26, 1986, 37 y.o.   MRN: [id number]  Information Source: Information source: Patient (PSA completed with pt)  Current Stressors:  Patient states their primary concerns and needs for treatment are:: " ... getting back on my medication" Patient states their goals for this hospitilization and ongoing recovery are:: '.... 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 / Learning stressors: None reoported Employment / Job issues: None reoported Family Relationships: " yes, my parents are dead and I have 2 sisters and we do not talk, I do not know where they live" Financial / Lack of resources (include bankruptcy): None reoported Housing / Lack of housing: " ...no, I have my own place to live, I live by myself" Physical health (include injuries & life threatening diseases): " yes, I have leg pain" Social relationships: " no" Substance abuse: " crystal meth, I ls  Living/Environment/Situation:  Living Arrangements: Alone Living conditions (as described by patient or guardian): " I have my own apt" Who else lives in the home?: " I live by myself" How long has patient lived in current situation?: " years" What is atmosphere in current home: Comfortable, Supportive  Family History:  Marital status: Single Are you sexually active?: Yes What is your sexual orientation?: " I am bisexual"  Childhood History:  By whom was/is the patient raised?: Both parents Additional childhood history information: na Description of patient's relationship with caregiver when they were a child: " It was a good relationship" Patient's description of current relationship with people who raised him/her: " we still had a good relationship, but they are dead" How were you disciplined when you got in trouble as a child/adolescent?: " I receieved a spanking or had things taken away from me" Does patient have  siblings?: Yes Number of Siblings: 2 Description of patient's current relationship with siblings: " we do not have a good relationship" Did patient suffer any verbal/emotional/physical/sexual abuse as a child?: No Did patient suffer from severe childhood neglect?: No Has patient ever been sexually abused/assaulted/raped as an adolescent or adult?: No Was the patient ever a victim of a crime or a disaster?: No Witnessed domestic violence?: No Has patient been affected by domestic violence as an adult?: No  Education:  Highest grade of school patient has completed: 12th Currently a student?: No Learning disability?: No  Employment/Work Situation:   Employment Situation: On disability Why is Patient on Disability: Due to schizoaffective diagnosis How Long has Patient Been on Disability: Since 2013 Patient's Job has Been Impacted by Current Illness: No What is the Longest Time Patient has Held a Job?: 4 mths Where was the Patient Employed at that Time?: [employer] Has Patient ever Been in the [employer]?: No  Financial Resources:   Financial resources: No income Does patient have a Lawyer or guardian?: No  Alcohol/Substance Abuse:   What has been your use of drugs/alcohol within the last 12 months?: " I last used meth in October" If attempted suicide, did drugs/alcohol play a role in this?: No Alcohol/Substance Abuse Treatment Hx: Denies past history If yes, describe treatment: na Has alcohol/substance abuse ever caused legal problems?: No  Social Support System:   Conservation officer, nature Support System: Fair Museum/gallery exhibitions officer System: " I have some friends" Type of faith/religion: " I am a Christian" How does patient's faith help to cope with current illness?: " I pray"  Leisure/Recreation:   Do You  Have Hobbies?: No  Strengths/Needs:   What is the patient's perception of their strengths?: ' I am loyal, respectful" Patient states these barriers may  affect/interfere with their treatment: " no barriers" Patient states these barriers may affect their return to the community: " no barriers" Other important information patient would like considered in planning for their treatment: na  Discharge Plan:   Currently receiving community mental health services: No Patient states concerns and preferences for aftercare planning are: " No barriers" Patient states they will know when they are safe and ready for discharge when: " keeping my priorties straight" Does patient have access to transportation?: No Does patient have financial barriers related to discharge medications?: No Patient description of barriers related to discharge medications: pt has active medical coverage Plan for no access to transportation at discharge: " I need the hospital to help" Will patient be returning to same living situation after discharge?: Yes  Summary/Recommendations:   Summary and Recommendations (to be completed by the evaluator): Kenneth Welch is a 37 yo male voluntarily admitted to [facility] after presenting to [facility] with complaint of auditory hallucinations. Pt was accompanied by the police. Pt reported strained relationship with family, death of parents, physical health and substance use. Pt denies SI/HI/AVH. Per chart review pt has a diagnosis of schizoaffective disorder, depressive type, and HIV. Pt reported his goal is to get back on his medications. Patient will benefit from crisis stabilization, medication evaluation, group therapy and psychoeducation, in addition to case management for discharge planning. At discharge it is recommended that Patient adhere to the established discharge plan and continue in treatment. Pt requested outpatient follow ups for medication management and therapy following discharge.  Kenneth Virginia R. 11/28/2023

## 2023-11-28 NOTE — Progress Notes (Signed)
   11/28/23 2050  Psych Admission Type (Psych Patients Only)  Admission Status Voluntary  Psychosocial Assessment  Patient Complaints None  Eye Contact Poor  Facial Expression Anxious  Affect Appropriate to circumstance;Anxious  Speech Logical/coherent  Interaction Cautious;Guarded  Motor Activity Other (Comment) (WNL)  Appearance/Hygiene Improved  Behavior Characteristics Appropriate to situation;Cooperative  Mood Anxious;Pleasant  Thought Process  Coherency WDL  Content WDL  Delusions None reported or observed  Perception WDL  Hallucination None reported or observed  Judgment Poor  Confusion None  Danger to Self  Current suicidal ideation? Denies  Agreement Not to Harm Self Yes  Description of Agreement verbally contracts  Danger to Others  Danger to Others None reported or observed

## 2023-11-28 NOTE — Hospital Course (Signed)
Past Psychiatric History: Patient has past psychiatric history of schizoaffective disorder Past Medical History: Patient has past medical history of HIV, asthma Family History: Patient's parents are deceased there is no pertinent family medical history provided Family Psychiatric  History: No pertinent family psychiatric history provided Social History: Patient lives alone.  Patient has an estranged sister and no other family.  Dr. Lolly Mustache - not anymore lives alone and receives SSI.

## 2023-11-28 NOTE — Progress Notes (Signed)
   11/28/23 0602  15 Minute Checks  Location Bedroom  Visual Appearance Calm  Behavior Sleeping  Sleep (Behavioral Health Patients Only)  Calculate sleep? (Click Yes once per 24 hr at 0600 safety check) Yes  Documented sleep last 24 hours 8.5
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
## 2023-11-29 ENCOUNTER — Encounter (HOSPITAL_COMMUNITY): Payer: Self-pay

## 2023-11-29 ENCOUNTER — Encounter (HOSPITAL_COMMUNITY): Payer: Self-pay | Admitting: Emergency Medicine

## 2023-11-29 DIAGNOSIS — Z79899 Other long term (current) drug therapy: Secondary | ICD-10-CM

## 2023-11-29 DIAGNOSIS — F251 Schizoaffective disorder, depressive type: Secondary | ICD-10-CM

## 2023-11-29 DIAGNOSIS — F1290 Cannabis use, unspecified, uncomplicated: Secondary | ICD-10-CM | POA: Diagnosis present

## 2023-11-29 DIAGNOSIS — F4310 Post-traumatic stress disorder, unspecified: Principal | ICD-10-CM | POA: Diagnosis not present

## 2023-11-29 DIAGNOSIS — F1520 Other stimulant dependence, uncomplicated: Secondary | ICD-10-CM

## 2023-11-29 HISTORY — DX: Other stimulant dependence, uncomplicated: F15.20

## 2023-11-29 HISTORY — DX: Other long term (current) drug therapy: Z79.899

## 2023-11-29 MED ORDER — CLONIDINE HCL ER 0.1 MG PO TB12
0.1000 mg | ORAL_TABLET | Freq: Every morning | ORAL | Status: DC
  Administered 2023-11-29 – 2023-12-01 (×3): 0.1 mg via ORAL
  Filled 2023-11-29 (×6): qty 1

## 2023-11-29 NOTE — BH IP Treatment Plan (Signed)
Interdisciplinary Treatment and Diagnostic Plan Update  11/29/2023 Time of Session: 11:15 AM Kenneth Welch. MRN: [id number]  Principal Diagnosis: Schizoaffective disorder, depressive type (HCC)  Secondary Diagnoses: Principal Problem:   Schizoaffective disorder, depressive type (HCC)   Current Medications:  Current Facility-Administered Medications  Medication Dose Route Frequency Provider Last Rate Last Admin   ARIPiprazole (ABILIFY) tablet 10 mg  10 mg Oral q AM Princess Bruins, DO   10 mg at 11/29/23 7829   bictegravir-emtricitabine-tenofovir AF (BIKTARVY) 50-200-25 MG per tablet 1 tablet  1 tablet Oral q AM Princess Bruins, DO   1 tablet at 11/29/23 5621   haloperidol lactate (HALDOL) injection 5 mg  5 mg Intramuscular TID PRN Miller-Almeida, Carlena Hurl, NP       And   diphenhydrAMINE (BENADRYL) injection 50 mg  50 mg Intramuscular TID PRN Miller-Almeida, Carlena Hurl, NP       And   LORazepam (ATIVAN) injection 2 mg  2 mg Intramuscular TID PRN Miller-Almeida, Carlena Hurl, NP       traZODone (DESYREL) tablet 50 mg  50 mg Oral QHS PRN Miller-Almeida, Carlena Hurl, NP   50 mg at 11/27/23 2046   PTA Medications: Medications Prior to Admission  Medication Sig Dispense Refill Last Dose/Taking   ARIPiprazole (ABILIFY) 10 MG tablet Take 1 tablet (10 mg total) by mouth at bedtime.      benztropine (COGENTIN) 0.5 MG tablet Take 1 tablet (0.5 mg total) by mouth at bedtime.      bictegravir-emtricitabine-tenofovir AF (BIKTARVY) 50-200-25 MG TABS tablet Take 1 tablet by mouth daily. (Patient not taking: Reported on 11/26/2023) 30 tablet 11    traZODone (DESYREL) 50 MG tablet Take 1 tablet (50 mg total) by mouth at bedtime as needed for sleep.       Patient Stressors:    Patient Strengths:    Treatment Modalities: Medication Management, Group therapy, Case management,  1 to 1 session with clinician, Psychoeducation, Recreational therapy.   Physician Treatment Plan for Primary Diagnosis:  Schizoaffective disorder, depressive type (HCC) Long Term Goal(s):     Short Term Goals:    Medication Management: Evaluate patient's response, side effects, and tolerance of medication regimen.  Therapeutic Interventions: 1 to 1 sessions, Unit Group sessions and Medication administration.  Evaluation of Outcomes: Not Progressing  Physician Treatment Plan for Secondary Diagnosis: Principal Problem:   Schizoaffective disorder, depressive type (HCC)  Long Term Goal(s):     Short Term Goals:       Medication Management: Evaluate patient's response, side effects, and tolerance of medication regimen.  Therapeutic Interventions: 1 to 1 sessions, Unit Group sessions and Medication administration.  Evaluation of Outcomes: Not Progressing   RN Treatment Plan for Primary Diagnosis: Schizoaffective disorder, depressive type (HCC) Long Term Goal(s): Knowledge of disease and therapeutic regimen to maintain health will improve  Short Term Goals: Ability to remain free from injury will improve, Ability to verbalize frustration and anger appropriately will improve, Ability to demonstrate self-control, Ability to participate in decision making will improve, Ability to verbalize feelings will improve, Ability to disclose and discuss suicidal ideas, Ability to identify and develop effective coping behaviors will improve, and Compliance with prescribed medications will improve  Medication Management: RN will administer medications as ordered by provider, will assess and evaluate patient's response and provide education to patient for prescribed medication. RN will report any adverse and/or side effects to prescribing provider.  Therapeutic Interventions: 1 on 1 counseling sessions, Psychoeducation, Medication administration, Evaluate responses to treatment, Monitor  vital signs and CBGs as ordered, Perform/monitor CIWA, COWS, AIMS and Fall Risk screenings as ordered, Perform wound care treatments as  ordered.  Evaluation of Outcomes: Not Progressing   LCSW Treatment Plan for Primary Diagnosis: Schizoaffective disorder, depressive type (HCC) Long Term Goal(s): Safe transition to appropriate next level of care at discharge, Engage patient in therapeutic group addressing interpersonal concerns.  Short Term Goals: Engage patient in aftercare planning with referrals and resources, Increase social support, Increase ability to appropriately verbalize feelings, Increase emotional regulation, Facilitate acceptance of mental health diagnosis and concerns, Facilitate patient progression through stages of change regarding substance use diagnoses and concerns, Identify triggers associated with mental health/substance abuse issues, and Increase skills for wellness and recovery  Therapeutic Interventions: Assess for all discharge needs, 1 to 1 time with Social worker, Explore available resources and support systems, Assess for adequacy in community support network, Educate family and significant other(s) on suicide prevention, Complete Psychosocial Assessment, Interpersonal group therapy.  Evaluation of Outcomes: Not Progressing   Progress in Treatment: Attending groups: No. Participating in groups: No. Taking medication as prescribed: Yes. Toleration medication: Yes. Family/Significant other contact made: Patient signed consents but all numbers are disconnected  Patient understands diagnosis: No. Discussing patient identified problems/goals with staff: No. Medical problems stabilized or resolved: Yes Denies suicidal/homicidal ideation: Yes. Issues/concerns per patient self-inventory: No.  New problem(s) identified:  No  New Short Term/Long Term Goal(s):    medication stabilization, elimination of SI thoughts, development of comprehensive mental wellness plan.    Patient Goals:  "get a job, go back to school."  Patient is interested in learning coping skills "to deal with problems."  "Get back  on medications."  Patient is also interested in outpatient medication management and therapy.  Patient has stable housing and lives by himself in [place].  Discharge Plan or Barriers:  Patient recently admitted. CSW will continue to follow and assess for appropriate referrals and possible discharge planning.    Reason for Continuation of Hospitalization: Anxiety Hallucinations Medication stabilization Suicidal ideation  Estimated Length of Stay:  5 - 7 days  Last 3 [place] Suicide Severity Risk Score: Flowsheet Row [facility] ([facility] from 11/26/2023 in [facility] from 10/23/2023 in [facility]  C-SSRS RISK CATEGORY No Risk No Risk No Risk       Last PHQ 2/9 Scores:    02/04/2022    3:28 PM 04/16/2021    2:12 PM  Depression screen PHQ 2/9  Decreased Interest 0 0  Down, Depressed, Hopeless 0 0  PHQ - 2 Score 0 0    Scribe for Treatment Team: Alla Feeling, LCSW-A 11/29/2023 12:37 PM

## 2023-11-29 NOTE — Progress Notes (Signed)
   11/29/23 0800  Psych Admission Type (Psych Patients Only)  Admission Status Voluntary  Psychosocial Assessment  Patient Complaints None  Eye Contact Poor  Facial Expression Anxious  Affect Appropriate to circumstance  Speech Logical/coherent;Soft  Interaction Cautious  Motor Activity Slow  Appearance/Hygiene Improved  Behavior Characteristics Cooperative;Calm  Mood Pleasant  Thought Process  Coherency WDL  Content WDL  Delusions None reported or observed  Perception WDL  Hallucination None reported or observed  Judgment Poor  Confusion None  Danger to Self  Current suicidal ideation? Denies  Agreement Not to Harm Self Yes  Description of Agreement verbal  Danger to Others  Danger to Others None reported or observed
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
## 2023-11-29 NOTE — Progress Notes (Signed)
Patient transferred to room 405-1 from 500 hall for continuation of care. No distress noted at time of transfer or complaints made.  Safety maintained.

## 2023-11-29 NOTE — Plan of Care (Signed)
  Problem: Coping: Goal: Ability to demonstrate self-control will improve Outcome: Progressing   Problem: Safety: Goal: Periods of time without injury will increase Outcome: Progressing   Problem: Coping: Goal: Coping ability will improve Outcome: Progressing

## 2023-11-29 NOTE — Plan of Care (Signed)
  Problem: Coping: Goal: Ability to demonstrate self-control will improve Outcome: Progressing   Problem: Safety: Goal: Periods of time without injury will increase Outcome: Progressing   

## 2023-11-30 ENCOUNTER — Telehealth (HOSPITAL_COMMUNITY): Payer: Self-pay | Admitting: Pharmacy Technician

## 2023-11-30 ENCOUNTER — Other Ambulatory Visit (HOSPITAL_COMMUNITY): Payer: Self-pay

## 2023-11-30 LAB — HIV-1 RNA QUANT-NO REFLEX-BLD
HIV 1 RNA Quant: 12600 {copies}/mL
LOG10 HIV-1 RNA: 4.1 {Log}

## 2023-11-30 MED ORDER — FLUOXETINE HCL 10 MG PO CAPS
10.0000 mg | ORAL_CAPSULE | Freq: Every morning | ORAL | Status: DC
  Administered 2023-11-30: 10 mg via ORAL
  Filled 2023-11-30 (×4): qty 1

## 2023-11-30 MED ORDER — BIKTARVY 50-200-25 MG PO TABS
1.0000 | ORAL_TABLET | Freq: Every day | ORAL | 0 refills | Status: DC
  Filled 2023-11-30: qty 30, 30d supply, fill #0

## 2023-11-30 MED ORDER — TRAZODONE HCL 50 MG PO TABS
25.0000 mg | ORAL_TABLET | Freq: Every evening | ORAL | Status: DC | PRN
  Administered 2023-11-30: 25 mg via ORAL
  Filled 2023-11-30: qty 1

## 2023-11-30 MED ORDER — ARIPIPRAZOLE ER 400 MG IM SRER
300.0000 mg | INTRAMUSCULAR | Status: DC
  Administered 2023-11-30: 300 mg via INTRAMUSCULAR

## 2023-11-30 NOTE — Telephone Encounter (Signed)
Pharmacy Patient Advocate Encounter  Received notification from [facility] that Prior Authorization for Abilify Maintena 300MG  syringes  has been APPROVED from 11/30/2023 to 11/29/2024. Ran test claim, Copay is $4.00. This test claim was processed through [facility]- copay amounts may vary at other pharmacies due to pharmacy/plan contracts, or as the patient moves through the different stages of their insurance plan.   PA #/Case ID/Reference #: [id number]

## 2023-11-30 NOTE — Progress Notes (Signed)
72 Hour Document Signed Note  Patient Details Name: Kenneth Welch. MRN: [id number] DOB: [birth year] Today's Date: 11/30/2023   72 Hour Signed Documentation:  Admission Status: Voluntary/72 hour document signed Date 72 hour document signed : 11/30/23 Time 72 hour document signed : [id number] Provider Notified (First and Last Name) (see details for LINK to note): Dr. Raynelle Fanning Ngyuen    Kenneth Welch [name] 11/30/2023, 9:22 AM

## 2023-11-30 NOTE — Plan of Care (Signed)
  Problem: Education: Goal: Knowledge of [facility] Education information/materials will improve Outcome: Progressing Goal: Emotional status will improve Outcome: Progressing Goal: Mental status will improve Outcome: Progressing Goal: Verbalization of understanding the information provided will improve Outcome: Progressing   

## 2023-11-30 NOTE — Progress Notes (Signed)
Pt received LAI with no complaints.   Pt reports the prozac he took this morning made him more depressed, and is frustrated he took. Pt reports he will not take it again.

## 2023-11-30 NOTE — Telephone Encounter (Signed)
Pharmacy Patient Advocate Encounter   Received notification that prior authorization for Abilify Maintena 300MG  syringes is required/requested.   Insurance verification completed.   The patient is insured through [facility] .   Per test claim: PA required; PA submitted to above mentioned insurance via CoverMyMeds Key/confirmation #/EOC [id number] Status is pending

## 2023-11-30 NOTE — Progress Notes (Signed)
Id brief note   Patient lost to hiv care  Restarted on biktarvy Pending hiv labs   I have made him a followup appointment with dr Daiva Eves on 12/19/23 @ 9am   I have discussed with our pharmacy team to provide 1 month biktarvy from the [facility]    Please reach out if any further question

## 2023-11-30 NOTE — Telephone Encounter (Signed)
Patient Product/process development scientist completed.    The patient is insured through [employer] [place].     Ran test claim for Biktarvy and the current 30 day co-pay is $0.00.   This test claim was processed through [facility]- copay amounts may vary at other pharmacies due to pharmacy/plan contracts, or as the patient moves through the different stages of their insurance plan.     Roland Earl, CPHT Pharmacy Technician III Certified Patient Advocate [facility] Patient Advocate Team Direct Number: [phone]  Fax: [phone]

## 2023-11-30 NOTE — Progress Notes (Signed)
Patient appears anxious. Patient denies SI/HI/AVH. Pt reports anxiety is 2/10 and depression is 1/10. Pt reports good sleep and good appetite. Patient did not have any morning medications. Patient remains safe on Q33min checks and contracts for safety.      11/30/23 0832  Psych Admission Type (Psych Patients Only)  Admission Status Voluntary  Psychosocial Assessment  Patient Complaints Anxiety  Eye Contact Poor  Facial Expression Anxious  Affect Appropriate to circumstance  Speech Logical/coherent  Interaction Cautious  Motor Activity Slow  Appearance/Hygiene Unremarkable  Behavior Characteristics Cooperative;Anxious  Mood Pleasant  Thought Process  Coherency WDL  Content WDL  Delusions None reported or observed  Perception WDL  Hallucination None reported or observed  Judgment Poor  Confusion None  Danger to Self  Current suicidal ideation? Denies  Agreement Not to Harm Self Yes  Description of Agreement verbal  Danger to Others  Danger to Others None reported or observed
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
## 2023-11-30 NOTE — Group Note (Signed)
Date:  11/30/2023 Time:  2:43 PM  Group Topic/Focus:  Coping With Mental Health Crisis:   The purpose of this group is to help patients identify strategies for coping with mental health crisis.  Group discusses possible causes of crisis and ways to manage them effectively.    Participation Level:  Did Not Attend    Kenneth Welch 11/30/2023, 2:43 PM

## 2023-11-30 NOTE — [facility] Group Notes (Signed)
Adult Psychoeducational Group Note  Date:  11/30/2023 Time:  9:55 PM  Group Topic/Focus:  Wrap-Up Group:   The focus of this group is to help patients review their daily goal of treatment and discuss progress on daily workbooks.  Participation Level:  Active  Participation Quality:  Appropriate  Affect:  Appropriate  Cognitive:  Appropriate  Insight: Appropriate  Engagement in Group:  Engaged  Modes of Intervention:  Discussion and Support  Additional Comments:  Pt told that today was a good day on the unit, the highlight of which was having a "good, meaningful conversation with my doctor." On the subject of ways to stay well upon discharge, Pt mentioned wanting to "stick to my priorities" and "take my medication." Pt rated his day a 6 out of 10.  Christ Kick 11/30/2023, 9:55 PM

## 2023-11-30 NOTE — Group Note (Signed)
LCSW Group Therapy Note   Group Date: 11/30/2023 Start Time: 1100 End Time: 1200   Type of Therapy and Topic:  Group Therapy - Who Am I?   Participation Level:  Active.  Patient came 30 minutes late to the group.  Description of Group:  The focus of this group was to aid patients in self-exploration and awareness. Patients were guided in exploring various factors of oneself to include interests, readiness to change, management of emotions, and individual perception of self. Patients were provided with complementary worksheets exploring hidden talents, ease of asking other for help, music/media preferences, understanding and responding to feelings/emotions, and hope for the future. At group closing, patients were encouraged to adhere to discharge plan to assist in continued self-exploration and understanding.   Therapeutic Goals: 1. Patients learned that self-exploration and awareness is an ongoing process  2. Patients identified their individual skills, preferences, and abilities  3. Patients explored their openness to establish and confide in supports  4. Patients explored their readiness for change and progression of mental health   Summary of Patient Progress:   Patient engaged in introductory check-in. Patient engaged in activity of self-exploration and identification, completing complementary worksheet to assist in discussion. Patient identified various factors ranging from hidden talents, favorite music and movies, trusted individuals, accountability, and individual perceptions of self and hope. Pt engaged in processing thoughts and feelings as well as means of reframing thoughts. Pt proved receptive of alternate group members input and feedback from CSW.   Therapeutic Modalities: Cognitive Behavioral Therapy  Motivational Interviewing    Alla Feeling, LCSWA 11/30/2023  12:44 PM

## 2023-11-30 NOTE — Plan of Care (Signed)
?  Problem: Education: Goal: Knowledge of [facility] Education information/materials will improve Outcome: Progressing Goal: Emotional status will improve Outcome: Progressing Goal: Mental status will improve Outcome: Progressing Goal: Verbalization of understanding the information provided will improve Outcome: Progressing   Problem: Activity: Goal: Interest or engagement in activities will improve Outcome: Progressing Goal: Sleeping patterns will improve Outcome: Progressing   Problem: Coping: Goal: Ability to verbalize frustrations and anger appropriately will improve Outcome: Progressing Goal: Ability to demonstrate self-control will improve Outcome: Progressing   Problem: Health Behavior/Discharge Planning: Goal: Identification of resources available to assist in meeting health care needs will improve Outcome: Progressing Goal: Compliance with treatment plan for underlying cause of condition will improve Outcome: Progressing   Problem: Physical Regulation: Goal: Ability to maintain clinical measurements within normal limits will improve Outcome: Progressing   Problem: Safety: Goal: Periods of time without injury will increase Outcome: Progressing   Problem: Activity: Goal: Will verbalize the importance of balancing activity with adequate rest periods Outcome: Progressing   Problem: Education: Goal: Will be free of psychotic symptoms Outcome: Progressing Goal: Knowledge of the prescribed therapeutic regimen will improve Outcome: Progressing   Problem: Coping: Goal: Coping ability will improve Outcome: Progressing Goal: Will verbalize feelings Outcome: Progressing   Problem: Health Behavior/Discharge Planning: Goal: Compliance with prescribed medication regimen will improve Outcome: Progressing   Problem: Nutritional: Goal: Ability to achieve adequate nutritional intake will improve Outcome: Progressing   Problem: Role Relationship: Goal:  Ability to communicate needs accurately will improve Outcome: Progressing Goal: Ability to interact with others will improve Outcome: Progressing   Problem: Safety: Goal: Ability to redirect hostility and anger into socially appropriate behaviors will improve Outcome: Progressing Goal: Ability to remain free from injury will improve Outcome: Progressing   Problem: Self-Care: Goal: Ability to participate in self-care as condition permits will improve Outcome: Progressing   Problem: Self-Concept: Goal: Will verbalize positive feelings about self Outcome: Progressing   Problem: Education: Goal: Utilization of techniques to improve thought processes will improve Outcome: Progressing Goal: Knowledge of the prescribed therapeutic regimen will improve Outcome: Progressing   Problem: Activity: Goal: Interest or engagement in leisure activities will improve Outcome: Progressing Goal: Imbalance in normal sleep/wake cycle will improve Outcome: Progressing   Problem: Coping: Goal: Coping ability will improve Outcome: Progressing Goal: Will verbalize feelings Outcome: Progressing   Problem: Health Behavior/Discharge Planning: Goal: Ability to make decisions will improve Outcome: Progressing Goal: Compliance with therapeutic regimen will improve Outcome: Progressing   Problem: Role Relationship: Goal: Will demonstrate positive changes in social behaviors and relationships Outcome: Progressing   Problem: Safety: Goal: Ability to disclose and discuss suicidal ideas will improve Outcome: Progressing Goal: Ability to identify and utilize support systems that promote safety will improve Outcome: Progressing   Problem: Self-Concept: Goal: Will verbalize positive feelings about self Outcome: Progressing Goal: Level of anxiety will decrease Outcome: Progressing   

## 2023-11-30 NOTE — Group Note (Signed)
Date:  11/30/2023 Time:  9:18 AM  Group Topic/Focus:  Goals Group:   The focus of this group is to help patients establish daily goals to achieve during treatment and discuss how the patient can incorporate goal setting into their daily lives to aide in recovery. Orientation:   The focus of this group is to educate the patient on the purpose and policies of crisis stabilization and provide a format to answer questions about their admission.  The group details unit policies and expectations of patients while admitted.    Participation Level:  Active  Participation Quality:  Appropriate  Affect:  Appropriate  Cognitive:  Appropriate  Insight: Appropriate  Engagement in Group:  Engaged  Modes of Intervention:  Discussion  Additional Comments:    Kenneth Welch D [name] 11/30/2023, 9:18 AM

## 2023-12-01 ENCOUNTER — Other Ambulatory Visit (HOSPITAL_COMMUNITY): Payer: Self-pay

## 2023-12-01 DIAGNOSIS — F4310 Post-traumatic stress disorder, unspecified: Secondary | ICD-10-CM

## 2023-12-01 LAB — URINE CYTOLOGY ANCILLARY ONLY
Chlamydia: NEGATIVE
Comment: NEGATIVE
Comment: NEGATIVE
Comment: NORMAL
Neisseria Gonorrhea: NEGATIVE
Trichomonas: NEGATIVE

## 2023-12-01 MED ORDER — ESCITALOPRAM OXALATE 5 MG PO TABS: 5.0000 mg | ORAL_TABLET | Freq: Every day | ORAL | 0 refills | Status: DC

## 2023-12-01 MED ORDER — ARIPIPRAZOLE ER 400 MG IM SRER: 300.0000 mg | INTRAMUSCULAR | 0 refills | Status: DC

## 2023-12-01 MED ORDER — TRAZODONE HCL 50 MG PO TABS: 25.0000 mg | ORAL_TABLET | Freq: Every evening | ORAL | 0 refills | Status: DC | PRN

## 2023-12-01 MED ORDER — ARIPIPRAZOLE 10 MG PO TABS: 10.0000 mg | ORAL_TABLET | Freq: Every morning | ORAL | 0 refills | Status: DC

## 2023-12-01 MED ORDER — CLONIDINE HCL ER 0.1 MG PO TB12: 0.1000 mg | ORAL_TABLET | Freq: Every morning | ORAL | 0 refills | Status: DC

## 2023-12-01 NOTE — Progress Notes (Signed)
Patient refused Prozac, states it was worsening his depression. Provider aware.

## 2023-12-01 NOTE — Group Note (Signed)
Recreation Therapy Group Note   Group Topic:Communication  Group Date: 12/01/2023 Start Time: 0935 End Time: 1054 Facilitators: Isidra Mings-McCall, LRT,CTRS Location: [address] Day[address]   Group Topic: Communication, Team Building, Problem Solving  Goal Area(s) Addresses:  Patient will effectively work with peer towards shared goal.  Patient will identify skill used to make activity successful.  Patient will identify how skills used during activity can be used to reach post d/c goals.   Intervention: Cup [employer] bands with attached strings enough for each group member, 10 or more cups  Group Description: Patient(s) were given a set of solo cups, a rubber band, and some tied strings. The objective is to build a pyramid with the cups by only using the rubber band and string to move the cups. After the activity the patient(s) are LRT debriefed and discussed what strategies worked, what didn't, and what lessons they can take from the activity and use in life post discharge.   Education Areas: Pharmacist, community, Counsellor, Discharge Planning   Education Outcome: Acknowledges education   Affect/Mood: N/A   Participation Level: Did not attend    Clinical Observations/Individualized Feedback:     Plan: Continue to engage patient in RT group sessions 2-3x/week.   Kenneth Welch, LRT,CTRS 12/01/2023 11:42 AM

## 2023-12-01 NOTE — Progress Notes (Signed)
  [facility] Adult Case Management Discharge Plan :  Will you be returning to the same living situation after discharge:  Yes,  Pt will be returning to apartment at discharge At discharge, do you have transportation home?: Yes,  CSW arranged [employer] taxi home at 12:00PM Do you have the ability to pay for your medications: Yes,  Pt has [employer] MCD  Release of information consent forms completed and in the chart;  Patient's signature needed at discharge.  Patient to Follow up at:  Follow-up Information     [facility]. Go on 12/11/2023.   Specialty: Professional Counselor Why: Please go to this provider for therapy services on 12/11/23 at 9:00 am. You may also go Monday through Friday, from 9 am to 1 pm. Contact information: [address] [place] [address] [phone]         [facility]. Go on 12/12/2023.   Specialty: Behavioral Health Why: Please go to this provider for medication management services on 12/12/23 at 7:00 am. You may also go Monday through Friday, arrive by 7:00 am for same day service. Contact information: [address] [place] [address] [phone]        [facility]             . Call.   Why: You have an appointment with this provider on 12/18/22 at 9:00AM, this appointment is in person. Contact information: [address] [place] [address]                Next level of care provider has access to [facility] Link:no  Safety Planning and Suicide Prevention discussed: No. Pt initially declined consents, then gave permission for sisters but did not have the right phone number for either of them.    Has patient been referred to the Quitline?: Patient refused referral for treatment  Patient has been referred for addiction treatment: Patient refused referral for treatment. According to patient, he does not use substances.  Kathi Der, LCSWA 12/01/2023, 9:14 AM

## 2023-12-01 NOTE — Progress Notes (Signed)
   12/01/23 0000  Psychosocial Assessment  Patient Complaints Anxiety;Depression  Eye Contact Fair  Facial Expression Anxious  Affect Anxious  Speech Logical/coherent  Interaction Assertive  Motor Activity Other (Comment) (WNL)  Appearance/Hygiene Unremarkable  Behavior Characteristics Cooperative  Mood Euthymic  Thought Process  Coherency WDL  Content WDL  Delusions None reported or observed  Perception WDL  Hallucination None reported or observed  Judgment Impaired  Confusion None  Danger to Self  Current suicidal ideation? Denies  Description of Suicide Plan None  Agreement Not to Harm Self Yes  Description of Agreement Verbal contract for safety  Danger to Others  Danger to Others None reported or observed

## 2023-12-01 NOTE — Progress Notes (Signed)
Patient discharged to home via taxi. Discharge instructions, all required discharge documents, and information about follow-up appointment given to pt with verbalization of understanding. All personal belongings returned to pt at time of discharge. Plan of Care resolved. Pt escorted to lobby by RN at 12:05 pm.  12/01/23 0850  Psych Admission Type (Psych Patients Only)  Admission Status Voluntary/72 hour document signed  Psychosocial Assessment  Patient Complaints None  Eye Contact Fair  Facial Expression Anxious  Affect Anxious  Speech Logical/coherent  Interaction Assertive  Motor Activity Other (Comment) (wnl)  Appearance/Hygiene Unremarkable  Behavior Characteristics Cooperative  Mood Euthymic  Thought Process  Coherency WDL  Content WDL  Delusions None reported or observed  Perception WDL  Hallucination None reported or observed  Judgment Impaired  Confusion None  Danger to Self  Current suicidal ideation? Denies  Agreement Not to Harm Self Yes  Description of Agreement Verbal  Danger to Others  Danger to Others None reported or observed

## 2023-12-01 NOTE — Plan of Care (Signed)
  Problem: Coping: Goal: Ability to verbalize frustrations and anger appropriately will improve Outcome: Progressing Goal: Ability to demonstrate self-control will improve Outcome: Progressing   Problem: Activity: Goal: Interest or engagement in activities will improve Outcome: Progressing Goal: Sleeping patterns will improve Outcome: Progressing   Problem: Health Behavior/Discharge Planning: Goal: Identification of resources available to assist in meeting health care needs will improve Outcome: Progressing Goal: Compliance with treatment plan for underlying cause of condition will improve Outcome: Progressing   Problem: Safety: Goal: Periods of time without injury will increase Outcome: Progressing

## 2023-12-01 NOTE — Plan of Care (Signed)
  Problem: Education: Goal: Knowledge of [facility] Education information/materials will improve Outcome: Progressing Goal: Emotional status will improve Outcome: Progressing Goal: Mental status will improve Outcome: Progressing Goal: Verbalization of understanding the information provided will improve Outcome: Progressing   

## 2023-12-01 NOTE — Discharge Summary (Signed)
Physician Discharge Summary Note  Patient:  Kenneth Welch. is a 37 y.o. male  MRN:  [id number]  DOB:  01-13-1986  Patient phone: [phone] (home)  Patient address:   [address] [place] [address]   Total Time spent with patient: 65 Minutes  Date of Admission:  11/27/2023  Date of Discharge: 12/01/23   Reason for Admission:  Schizoaffective Disorder, Bipolar Type  Per Dr. Jackquline Berlin, "37 year old African-American male, single, unemployed, on SSI disability, lives alone. Known history of schizoaffective disorder, substance use disorder in remission and HIV. He has not been adherent with psychotropic medication and HAART for over a year. He seems insightful as he called for help himself. Negative ruminative and critical thoughts in his mind. No active hallucination. No dangerousness. He is agreeable to reinstatement of aripiprazole which has helped in the past. He is also agreeable to transitioning to a longer acting injectable if he tolerates the oral form well. We will get infectious disease consult and hopefully get him back on his antiretroviral medications as they are helpful in addressing comorbid mood and psychotic disorder in this population. "   Principal Problem: PTSD (post-traumatic stress disorder)  Discharge Diagnoses: Principal Problem:   PTSD (post-traumatic stress disorder) Active Problems:   Schizoaffective disorder, unspecified (HCC)   HIV disease (HCC)   Methamphetamine use disorder, severe (HCC)   Cannabis use disorder   Long term current use of antipsychotic medication     Past Psychiatric (and medical) History: Kenneth Welch.  has a past medical history of Acute HIV infection (HCC) (04/16/2021), Asthma, Chlamydia (07/13/2021), Gonorrhea (07/13/2021), HIV (human immunodeficiency virus infection) (HCC), HIV disease (HCC) (02/04/2022), Long term current use of antipsychotic medication (11/29/2023), Methamphetamine use disorder, severe  (HCC) (11/29/2023), Monkeypox (07/13/2021), Pain associated with defecation (07/13/2021), Papules (07/13/2021), Rectal bleeding (04/16/2021), and Schizophrenia (HCC).   Past Psychiatric History:  Diagnoses: Schizoaffective d/o, meth use d/o Inpatient treatment: multiple,  3x ~2004 - first episode psychosis Suicide: denied Homicide: denied Medication history: seroquel, haldol, risperdal, zyprexa, abilify Medication compliance: poor Psychotherapy: denied Neuromodulation: denied Current Psychiatrist: denied Current therapist: denied  Past Medical History:  Past Medical History:  Diagnosis Date   Acute HIV infection (HCC) 04/16/2021   Asthma    Chlamydia 07/13/2021   Gonorrhea 07/13/2021   HIV (human immunodeficiency virus infection) (HCC)    HIV disease (HCC) 02/04/2022   Long term current use of antipsychotic medication 11/29/2023   Methamphetamine use disorder, severe (HCC) 11/29/2023   Monkeypox 07/13/2021   Pain associated with defecation 07/13/2021   Papules 07/13/2021   Rectal bleeding 04/16/2021   Schizophrenia (HCC)      History reviewed. No pertinent surgical history.   Family History: History reviewed. No pertinent family history.   Family Psychiatric  History: Denied  Social History:  Social History   Substance and Sexual Activity  Alcohol Use Never     Social History   Substance and Sexual Activity  Drug Use Yes   Types: Marijuana     Social History   Socioeconomic History   Marital status: Single    Spouse name: Not on file   Number of children: Not on file   Years of education: Not on file   Highest education level: Not on file  Occupational History   Not on file  Tobacco Use   Smoking status: Never   Smokeless tobacco: Never  Vaping Use   Vaping status: Never Used  Substance and Sexual Activity   Alcohol use:  Never   Drug use: Yes    Types: Marijuana   Sexual activity: Not Currently    Comment: accepted condoms  Other Topics  Concern   Not on file  Social History Narrative   ** Merged History Encounter **       Social Drivers of Health   Financial Resource Strain: Not on file  Food Insecurity: Food Insecurity Present (11/27/2023)   Hunger Vital Sign    Worried About Running Out of Food in the Last Year: Sometimes true    Ran Out of Food in the Last Year: Sometimes true  Transportation Needs: Unmet Transportation Needs (11/27/2023)   PRAPARE - Administrator, Civil Service (Medical): Yes    Lack of Transportation (Non-Medical): Yes  Physical Activity: Not on file  Stress: Not on file  Social Connections: Not on file     Hospital Course:  During the patient's hospitalization, patient had extensive initial psychiatric evaluation, and follow-up psychiatric evaluations every day.  Psychiatric diagnoses provided upon initial assessment: Schizoaffective disorder (HCC) [F25.9]   The patient was started on Abilify which was titrated to 10 mg.  He received a Abilify maintainer injection on 11/30/2023.  He was provided with 13 days of oral medication to complete after discharge.  Clonidine 0.1 mg was prescribed in conjunction with fluoxetine.  The patient felt like fluoxetine caused paradoxical anxiety and the medication was subsequently changed to Lexapro at discharge.  Patient's care was discussed during the interdisciplinary team meeting every day during the hospitalization.  The patient denied having side effects to prescribed psychiatric medication.  Gradually, patient started adjusting to milieu. The patient was evaluated each day by a clinical provider to ascertain response to treatment. Improvement was noted by the patient's report of decreasing symptoms, improved sleep and appetite, affect, medication tolerance, behavior, and participation in unit programming.  Patient was asked each day to complete a self inventory noting mood, mental status, pain, new symptoms, anxiety and concerns.     Symptoms were reported as significantly decreased or resolved completely by discharge.   On day of discharge, the patient reports that their mood is stable. The patient denied having suicidal thoughts for more than 48 hours prior to discharge.  Patient denies having homicidal thoughts.  Patient denies having auditory hallucinations.  Patient denies any visual hallucinations or other symptoms of psychosis. The patient was motivated to continue taking medication with a goal of continued improvement in mental health.   The patient reports their target psychiatric symptoms of racing thoughts responded well to the psychiatric medications, and the patient reports overall benefit other psychiatric hospitalization. Supportive psychotherapy was provided to the patient. The patient also participated in regular group therapy while hospitalized. Coping skills, problem solving as well as relaxation therapies were also part of the unit programming.  Labs were reviewed with the patient, and abnormal results were discussed with the patient.  The patient is able to verbalize their individual safety plan to this provider.    Physical Findings:  AIMS:  Facial and Oral Movements: None Muscles of Facial Expression: None Lips and Perioral Area: None Jaw: None Tongue: None,Extremity Movements Upper (arms, wrists, hands, fingers): None Lower (legs, knees, ankles, toes): None, Trunk Movements Neck, shoulders, hips: None, Global Judgements Severity of abnormal movements overall: None Incapacitation due to abnormal movements: None Patient's awareness of abnormal movements: No Awareness, Dental Status Current problems with teeth and/or dentures: No Does patient usually wear dentures: No Edentia: No   CIWA:  NA  COWS:  NA  Musculoskeletal: Strength & Muscle Tone: within normal limits Gait & Station: normal Patient leans: N/A    Psychiatric Specialty Exam:  Presentation  General Appearance:  Appropriate for Environment  Eye Contact: Good  Speech: Normal Rate  Speech Volume: Normal  Handedness: Right   Mood and Affect  Mood: Anxious; Euthymic  Affect: Congruent   Thought Process  Thought Processes: Linear  Descriptions of Associations: Intact  Orientation: Full (Time, Place and Person)  Thought Content: Logical  History of Schizophrenia/Schizoaffective disorder: No  Duration of Psychotic Symptoms: NA Hallucinations: Hallucinations: None  Ideas of Reference: None  Suicidal Thoughts: Suicidal Thoughts: No  Homicidal Thoughts: Homicidal Thoughts: No   Sensorium  Memory: Immediate Good; Recent Good  Judgment: Fair  Insight: Fair   Art therapist  Concentration: Good  Attention Span: Good  Recall: Good  Fund of Knowledge: Good  Language: Good   Psychomotor Activity  Psychomotor Activity: Psychomotor Activity: Normal   Assets  Assets: Communication Skills; Desire for Improvement   Sleep  Sleep: Sleep: Good      Physical Exam: General: Sitting comfortably. NAD. HEENT: Normocephalic, atraumatic, MMM, EMOI Lungs: no increased work of breathing noted Heart: no cyanosis Abdomen: Non distended Musculoskeletal: FROM. No obvious deformities Skin: Warm, dry, intact. No rashes noted Neuro: No obvious focal deficits.  Gait and station are normal  Review of Systems:  Constitutional: Negative.   HENT: Negative.    Eyes: Negative.   Respiratory: Negative.    Cardiovascular: Negative.   Gastrointestinal: Negative.   Genitourinary: Negative.   Skin: Negative.   Neurological: Negative.   Psychiatric/Behavioral:  Negative  Blood pressure 112/67, pulse 82, temperature 98 F (36.7 C), temperature source Oral, resp. rate 20, height 6\' 3"  (1.905 m), weight 79.4 kg, SpO2 99%. Body mass index is 21.87 kg/m.    Social History   Tobacco Use  Smoking Status Never  Smokeless Tobacco Never     Tobacco Cessation: Non-smoker A  prescription for an FDA approved medication for tobacco cessation was not prescribed at discharge   Blood Alcohol level:  Lab Results  Component Value Date   [facility] <10 11/26/2023   ETH <10 10/23/2023    Metabolic Disorder Labs:  Lab Results  Component Value Date   HGBA1C 5.9 (H) 06/13/2023   MPG 122.63 06/13/2023   MPG 120 03/28/2016   Lab Results  Component Value Date   PROLACTIN 19.4 06/13/2023    Lab Results  Component Value Date   CHOL 148 11/26/2023   TRIG 65 11/26/2023   HDL 43 11/26/2023   VLDL 13 11/26/2023   LDLCALC 92 11/26/2023   LDLCALC 121 (H) 06/13/2023      See Psychiatric Specialty Exam and Suicide Risk Assessment completed by Attending Physician prior to discharge.  Discharge destination: Home  Is patient on multiple antipsychotic therapies at discharge:  No  Has Patient had three or more failed trials of antipsychotic monotherapy by history: NA Recommended Plan for Multiple Antipsychotic Therapies: NA   Discharge Instructions     Diet - low sodium heart healthy   Complete by: As directed    Increase activity slowly   Complete by: As directed         Allergies as of 12/01/2023   No Known Allergies      Medication List     STOP taking these medications    benztropine 0.5 MG tablet Commonly known as: COGENTIN       TAKE these medications  Indication  ARIPiprazole 10 MG tablet Commonly known as: ABILIFY Take 1 tablet (10 mg total) by mouth in the morning for 13 days. Start taking on: December 02, 2023 What changed: when to take this  Indication: Manic Phase of Manic-Depression   ARIPiprazole ER 400 MG Srer injection Commonly known as: ABILIFY MAINTENA Inject 1.5 mLs (300 mg total) into the muscle every 28 (twenty-eight) days. Start taking on: December 28, 2023 What changed: You were already taking a medication with the same name, and this prescription was added. Make sure you understand how and when to take each.   Indication: mood and ptsd adjunct, med adherence   Biktarvy 50-200-25 MG Tabs tablet Generic drug: bictegravir-emtricitabine-tenofovir AF Take 1 tablet by mouth daily.  Indication: HIV Disease   cloNIDine HCl 0.1 MG Tb12 ER tablet Commonly known as: KAPVAY Take 1 tablet (0.1 mg total) by mouth in the morning. Start taking on: December 02, 2023  Indication: PTSD   escitalopram 5 MG tablet Commonly known as: Lexapro Take 1 tablet (5 mg total) by mouth at bedtime.  Indication: Generalized Anxiety Disorder   traZODone 50 MG tablet Commonly known as: DESYREL Take 0.5 tablets (25 mg total) by mouth at bedtime as needed and may repeat dose one time if needed for sleep. What changed:  how much to take when to take this  Indication: Trouble Sleeping          Follow-up Information     [facility] Go on 12/11/2023.   Specialty: Professional Counselor Why: Please go to this provider for therapy services on 12/11/23 at 9:00 am. You may also go Monday through Friday, from 9 am to 1 pm. Contact information: [address] [place] [address] [phone]         [facility]. Go on 12/12/2023.   Specialty: Behavioral Health Why: Please go to this provider for medication management services on 12/12/23 at 7:00 am. You may also go Monday through Friday, arrive by 7:00 am for same day service. Contact information: [address] [place] [address] [phone]        [facility]             . Call.   Why: You have an appointment with this provider on 12/18/22 at 9:00AM, this appointment is in person. Their phone number is [phone]. Contact information: [address] [place] [address]                   Follow-up recommendations:  - It is recommended to the patient to continue psychiatric medications as prescribed, after discharge from the  hospital.   - It is recommended to the patient to follow up with your outpatient psychiatric provider and PCP. - It was discussed with the patient, the impact of alcohol, drugs, tobacco have been there overall psychiatric and medical wellbeing, and total abstinence from substance use was recommended the patient. - Prescriptions provided or sent directly to preferred pharmacy at discharge. Patient agreeable to plan. Given opportunity to ask questions. Appears to feel comfortable with discharge.   - In the event of worsening symptoms, the patient is instructed to call the crisis hotline, 911 and or go to the nearest ED for appropriate evaluation and treatment of symptoms. To follow-up with primary care provider for other medical issues, concerns and or health care needs - Patient was discharged home with a plan to follow up  as noted below.   Comments:  NA  Signed: Criss Alvine, MD 12/01/23 11:56 AM

## 2023-12-01 NOTE — BHH Suicide Risk Assessment (Signed)
[facility] Discharge Suicide Risk Assessment   Principal Problem: PTSD (post-traumatic stress disorder)  Discharge Diagnoses: Principal Problem:   PTSD (post-traumatic stress disorder) Active Problems:   Schizoaffective disorder, unspecified (HCC)   HIV disease (HCC)   Methamphetamine use disorder, severe (HCC)   Cannabis use disorder   Long term current use of antipsychotic medication      Total Time spent with patient: 40  Musculoskeletal: Strength & Muscle Tone: within normal limits Gait & Station: normal Patient leans: N/A   Psychiatric Specialty Exam:  Presentation  General Appearance: Appropriate for Environment  Eye Contact: Good  Speech: Normal Rate  Speech Volume: Normal  Handedness: Right   Mood and Affect  Mood: Anxious; Euthymic  Affect: Congruent   Thought Process  Thought Processes: Linear  Descriptions of Associations: Intact  Orientation: Full (Time, Place and Person)  Thought Content: Logical  History of Schizophrenia/Schizoaffective disorder: No  Duration of Psychotic Symptoms: NA Hallucinations: Hallucinations: None  Ideas of Reference: None  Suicidal Thoughts: Suicidal Thoughts: No  Homicidal Thoughts: Homicidal Thoughts: No   Sensorium  Memory: Immediate Good; Recent Good  Judgment: Fair  Insight: Fair   Art therapist  Concentration: Good  Attention Span: Good  Recall: Good  Fund of Knowledge: Good  Language: Good   Psychomotor Activity  Psychomotor Activity: Psychomotor Activity: Normal   Assets  Assets: Communication Skills; Desire for Improvement   Sleep  Sleep: Sleep: Good   Physical Exam: General: Sitting comfortably. NAD. HEENT: Normocephalic, atraumatic, MMM, EMOI Lungs: no increased work of breathing noted Heart: no cyanosis Abdomen: Non distended Musculoskeletal: FROM. No obvious deformities Skin: Warm, dry, intact. No rashes noted Neuro: No obvious focal deficits.  Gait and station  are normal  Review of Systems  Constitutional: Negative.   HENT: Negative.    Eyes: Negative.   Respiratory: Negative.    Cardiovascular: Negative.   Gastrointestinal: Negative.   Genitourinary: Negative.   Skin: Negative.   Neurological: Negative.   Psychiatric/Behavioral:  Negative   Mental Status Per Nursing Assessment: NA  Demographic Factors:  Male, Living alone, and Unemployed  Loss Factors: NA  Historical Factors: Impulsivity  Risk Reduction Factors:   NA  Continued Clinical Symptoms:  Previous psychiatric diagnoses and treatments  Cognitive Features That Contribute To Risk:  None  Suicide Risk:  Minimal: No identifiable suicidal ideation.  Patients presenting with no risk factors but with morbid ruminations; may be classified as minimal risk based on the severity of the depressive symptoms.    Follow-up Information     [place] Go on 12/11/2023.   Specialty: Professional Counselor Why: Please go to this provider for therapy services on 12/11/23 at 9:00 am. You may also go Monday through Friday, from 9 am to 1 pm. Contact information: [address] [place] [address] [phone]         [facility] Go on 12/12/2023.   Specialty: Behavioral Health Why: Please go to this provider for medication management services on 12/12/23 at 7:00 am. You may also go Monday through Friday, arrive by 7:00 am for same day service. Contact information: [address] [place] [address] [phone]        [facility]             . Call.   Why: You have an appointment with this provider on 12/18/22 at 9:00AM, this appointment is in person. Their phone number is [phone]. Contact information: [address] [place] [address]                 Plan Of Care/Follow-up recommendations:  Activity: as tolerated  Diet: heart  healthy  Other: -Follow-up with your outpatient psychiatric provider -instructions on appointment date, time, and address (location) are provided to you in discharge paperwork.  -Take your psychiatric medications as prescribed at discharge - instructions are provided to you in the discharge paperwork  -Follow-up with outpatient primary care doctor and other specialists -for management of preventative medicine and chronic medical issues  -Testing: Follow-up with outpatient provider for abnormal lab results: NA  -If you are prescribed an atypical antipsychotic medication, we recommend that your outpatient psychiatrist follow routine screening for side effects within 3 months of discharge, including monitoring: AIMS scale, height, weight, blood pressure, fasting lipid panel, HbA1c, and fasting blood sugar.   -Recommend total abstinence from alcohol, tobacco, and other illicit drug use at discharge.   -If your psychiatric symptoms recur, worsen, or if you have side effects to your psychiatric medications, call your outpatient psychiatric provider, 911, [phone] or go to the nearest emergency department.  -If suicidal thoughts occur, immediately call your outpatient psychiatric provider, 911, [phone] or go to the nearest emergency department.   Criss Alvine, MD 12/01/23 10:46 AM

## 2023-12-01 NOTE — Transportation (Signed)
12/01/2023  Kenneth Welch. DOB: June 02, 1986 MRN: [id number]   RIDER WAIVER AND RELEASE OF LIABILITY  For the purposes of helping with transportation needs, [facility] partners with outside transportation providers (taxi companies, [place], Catering manager.) to give [name] patients or other approved people the choice of on-demand rides [employer]") to our buildings for non-emergency visits.  By using [employer], I, the person signing this document, on behalf of myself and/or any legal minors (in my care using the [employer]), agree:  Science writer given to me are supplied by independent, outside transportation providers who do not work for, or have any affiliation with, [name]. [place] is not a transportation company. [facility] has no control over the quality or safety of the rides I get using [employer]. [facility] has no control over whether any outside ride will happen on time or not. [facility] gives no guarantee on the reliability, quality, safety, or availability on any rides, or that no mistakes will happen. I know and accept that traveling by vehicle (car, truck, [employer], Zenaida Niece, bus, taxi, etc.) has risks of serious injuries such as disability, being paralyzed, and death. I know and agree the risk of using [employer] is mine alone, and not [employer]. Transport Services are provided "as is" and as are available. The transportation providers are in charge for all inspections and care of the vehicles used to provide these rides. I agree not to take legal action against [facility], its agents, employees, officers, directors, representatives, insurers, attorneys, assigns, successors, subsidiaries, and affiliates at any time for any reasons related directly or indirectly to using [employer]. I also agree not to take legal action against [facility] or its affiliates for any injury, death, or damage to property caused by or related  to using [employer]. I have read this Waiver and Release of Liability, and I understand the terms used in it and their legal meaning. This Waiver is freely and voluntarily given with the understanding that my right (or any legal minors) to legal action against [place] relating to [employer] is knowingly given up to use these services.   I attest that I read the Ride Waiver and Release of Liability to Kenneth Welch., gave [name] the opportunity to ask questions and answered the questions asked (if any). I affirm that Kenneth Welch. then provided consent for assistance with transportation.

## 2023-12-01 NOTE — BHH Group Notes (Signed)
Adult Psychoeducational Group Note  Date:  12/01/2023 Time:  9:44 AM  Group Topic/Focus:  Emotional Education:   The focus of this group is to discuss what feelings/emotions are, and how they are experienced. Goals Group:   The focus of this group is to help patients establish daily goals to achieve during treatment and discuss how the patient can incorporate goal setting into their daily lives to aide in recovery.  Participation Level:  Did Not Attend    Additional Comments:  Did not attend  Shena Vinluan Alen Blew 12/01/2023,

## 2023-12-19 ENCOUNTER — Ambulatory Visit: Payer: MEDICAID | Admitting: Infectious Disease

## 2023-12-19 DIAGNOSIS — F4310 Post-traumatic stress disorder, unspecified: Secondary | ICD-10-CM

## 2023-12-19 DIAGNOSIS — F251 Schizoaffective disorder, depressive type: Secondary | ICD-10-CM

## 2023-12-19 DIAGNOSIS — Z5900 Homelessness unspecified: Secondary | ICD-10-CM

## 2023-12-19 DIAGNOSIS — B20 Human immunodeficiency virus [HIV] disease: Secondary | ICD-10-CM

## 2023-12-19 DIAGNOSIS — F1520 Other stimulant dependence, uncomplicated: Secondary | ICD-10-CM

## 2023-12-19 DIAGNOSIS — F1290 Cannabis use, unspecified, uncomplicated: Secondary | ICD-10-CM

## 2023-12-19 DIAGNOSIS — Z79899 Other long term (current) drug therapy: Secondary | ICD-10-CM

## 2023-12-19 DIAGNOSIS — A549 Gonococcal infection, unspecified: Secondary | ICD-10-CM

## 2024-01-16 ENCOUNTER — Inpatient Hospital Stay (HOSPITAL_COMMUNITY)
Admission: EM | Admit: 2024-01-16 | Discharge: 2024-01-19 | DRG: 603 | Disposition: A | Discharge status: Home / Self Care | Payer: MEDICAID | Attending: Family Medicine | Admitting: Family Medicine

## 2024-01-16 ENCOUNTER — Encounter (HOSPITAL_COMMUNITY): Payer: Self-pay | Admitting: Emergency Medicine

## 2024-01-16 ENCOUNTER — Other Ambulatory Visit: Payer: Self-pay

## 2024-01-16 DIAGNOSIS — M795 Residual foreign body in soft tissue: Secondary | ICD-10-CM | POA: Diagnosis present

## 2024-01-16 DIAGNOSIS — L03114 Cellulitis of left upper limb: Principal | ICD-10-CM | POA: Diagnosis present

## 2024-01-16 DIAGNOSIS — Z79899 Other long term (current) drug therapy: Secondary | ICD-10-CM

## 2024-01-16 DIAGNOSIS — F259 Schizoaffective disorder, unspecified: Secondary | ICD-10-CM | POA: Diagnosis present

## 2024-01-16 DIAGNOSIS — L02419 Cutaneous abscess of limb, unspecified: Secondary | ICD-10-CM | POA: Insufficient documentation

## 2024-01-16 DIAGNOSIS — L0291 Cutaneous abscess, unspecified: Principal | ICD-10-CM

## 2024-01-16 DIAGNOSIS — F1510 Other stimulant abuse, uncomplicated: Secondary | ICD-10-CM | POA: Diagnosis present

## 2024-01-16 DIAGNOSIS — B20 Human immunodeficiency virus [HIV] disease: Secondary | ICD-10-CM | POA: Diagnosis present

## 2024-01-16 DIAGNOSIS — L0390 Cellulitis, unspecified: Secondary | ICD-10-CM | POA: Diagnosis present

## 2024-01-16 DIAGNOSIS — I96 Gangrene, not elsewhere classified: Secondary | ICD-10-CM | POA: Diagnosis present

## 2024-01-16 DIAGNOSIS — J4520 Mild intermittent asthma, uncomplicated: Secondary | ICD-10-CM | POA: Diagnosis present

## 2024-01-16 DIAGNOSIS — L02414 Cutaneous abscess of left upper limb: Secondary | ICD-10-CM | POA: Diagnosis present

## 2024-01-16 LAB — I-STAT CG4 LACTIC ACID, ED: Lactic Acid, Venous: 1.2 mmol/L (ref 0.5–1.9)

## 2024-01-16 LAB — PROTIME-INR
INR: 1 (ref 0.8–1.2)
Prothrombin Time: 13.4 s (ref 11.4–15.2)

## 2024-01-16 MED ORDER — VANCOMYCIN HCL IN DEXTROSE 1-5 GM/200ML-% IV SOLN
1000.0000 mg | Freq: Once | INTRAVENOUS | Status: DC
Start: 2024-01-17 — End: 2024-01-16

## 2024-01-16 MED ORDER — LIDOCAINE-EPINEPHRINE-TETRACAINE (LET) TOPICAL GEL
3.0000 mL | Freq: Once | TOPICAL | Status: AC
  Administered 2024-01-17: 3 mL via TOPICAL
  Filled 2024-01-16: qty 3

## 2024-01-16 MED ORDER — LIDOCAINE-EPINEPHRINE 1 %-1:100000 IJ SOLN
10.0000 mL | Freq: Once | INTRAMUSCULAR | Status: AC
  Administered 2024-01-17: 10 mL
  Filled 2024-01-16: qty 1

## 2024-01-16 MED ORDER — VANCOMYCIN HCL 1500 MG/300ML IV SOLN
1500.0000 mg | Freq: Once | INTRAVENOUS | Status: AC
  Administered 2024-01-17: 1500 mg via INTRAVENOUS
  Filled 2024-01-16: qty 300

## 2024-01-16 NOTE — ED Provider Notes (Signed)
[place] EMERGENCY DEPARTMENT AT [facility] Provider Note   CSN: [id number] Arrival date & time: 01/16/24  2254     History {Add pertinent medical, surgical, social history, OB history to HPI:1} Chief Complaint  Patient presents with   Abscess   Wound Infection    Christerpher Clos. is a 38 y.o. male, history of HIV, schizoaffective disorder, who presents to the ED secondary to multiple infected sores on bilateral lower arms.  He states that the sores started popping up about 2 days ago.  Reports that he has been shooting up crystal meth, with last occurrence about 3 days ago.  Denies any fevers, chills.  States pain is sharp and painful.  Has not been taking his HIV medications for the last 3 days as he is out of them.  Home Medications Prior to Admission medications   Medication Sig Start Date End Date Taking? Authorizing Provider  ARIPiprazole (ABILIFY) 10 MG tablet Take 1 tablet (10 mg total) by mouth in the morning for 13 days. 12/02/23 12/15/23  Golda Acre, MD  ARIPiprazole ER (ABILIFY MAINTENA) 400 MG SRER injection Inject 1.5 mLs (300 mg total) into the muscle every 28 (twenty-eight) days. 12/28/23   Golda Acre, MD  bictegravir-emtricitabine-tenofovir AF (BIKTARVY) 50-200-25 MG TABS tablet Take 1 tablet by mouth daily. 11/30/23 12/30/23  Vu, Gershon Mussel T, MD  cloNIDine HCl (KAPVAY) 0.1 MG TB12 ER tablet Take 1 tablet (0.1 mg total) by mouth in the morning. 12/02/23   Golda Acre, MD  escitalopram (LEXAPRO) 5 MG tablet Take 1 tablet (5 mg total) by mouth at bedtime. 12/01/23   Golda Acre, MD  traZODone (DESYREL) 50 MG tablet Take 0.5 tablets (25 mg total) by mouth at bedtime as needed and may repeat dose one time if needed for sleep. 12/01/23   Golda Acre, MD      Allergies    Patient has no known allergies.    Review of Systems   Review of Systems  Constitutional:  Negative for fever.  Skin:  Positive for wound.    Physical  Exam Updated Vital Signs BP 134/82 (BP Location: Right Arm)   Pulse (!) 105   Temp 99.1 F (37.3 C) (Oral)   Resp 16   Ht 6\' 3"  (1.905 m)   Wt 79.4 kg   SpO2 97%   BMI 21.87 kg/m  Physical Exam Vitals and nursing note reviewed.  Constitutional:      General: He is not in acute distress.    Appearance: He is well-developed.  HENT:     Head: Normocephalic and atraumatic.  Eyes:     Conjunctiva/sclera: Conjunctivae normal.  Cardiovascular:     Rate and Rhythm: Normal rate and regular rhythm.     Heart sounds: No murmur heard. Pulmonary:     Effort: Pulmonary effort is normal. No respiratory distress.     Breath sounds: Normal breath sounds.  Abdominal:     Palpations: Abdomen is soft.     Tenderness: There is no abdominal tenderness.  Musculoskeletal:        General: No swelling.     Cervical back: Neck supple.  Skin:    General: Skin is warm and dry.     Capillary Refill: Capillary refill takes less than 2 seconds.     Comments: Multiple purulent fluctuant masses, the bilateral forearms.  See pictures for further detail.  2 large 4 x 4 cm abscesses, to left anterior and posterior forearm.  Necrotic lesions, present in all abscesses.  Track marks present  Neurological:     Mental Status: He is alert.  Psychiatric:        Mood and Affect: Mood normal.          ED Results / Procedures / Treatments   Labs (all labs ordered are listed, but only abnormal results are displayed) Labs Reviewed  CULTURE, BLOOD (ROUTINE X 2)  CULTURE, BLOOD (ROUTINE X 2)  COMPREHENSIVE METABOLIC PANEL  CBC WITH DIFFERENTIAL/PLATELET  PROTIME-INR  I-STAT CG4 LACTIC ACID, ED    EKG None  Radiology No results found.  Procedures Procedures  {Document cardiac monitor, telemetry assessment procedure when appropriate:1}  Medications Ordered in ED Medications - No data to display  ED Course/ Medical Decision Making/ A&P   {   Click here for ABCD2, HEART and other  calculatorsREFRESH Note before signing :1}                              Medical Decision Making Amount and/or Complexity of Data Reviewed Labs: ordered. Radiology: ordered.  Risk Prescription drug management.   ***  {Document critical care time when appropriate:1} {Document review of labs and clinical decision tools ie heart score, Chads2Vasc2 etc:1}  {Document your independent review of radiology images, and any outside records:1} {Document your discussion with family members, caretakers, and with consultants:1} {Document social determinants of health affecting pt's care:1} {Document your decision making why or why not admission, treatments were needed:1} Final Clinical Impression(s) / ED Diagnoses Final diagnoses:  None    Rx / DC Orders ED Discharge Orders     None

## 2024-01-16 NOTE — ED Triage Notes (Signed)
  Patient comes in with multiple infected sores on bilateral lower arms.  Patient is HIV positive and states he has been shooting up crystal meth with last occurrence 3 days ago.  Denies any fevers.  Pain 6/10, sharp.

## 2024-01-17 ENCOUNTER — Emergency Department (HOSPITAL_COMMUNITY): Payer: MEDICAID

## 2024-01-17 DIAGNOSIS — F1510 Other stimulant abuse, uncomplicated: Secondary | ICD-10-CM | POA: Diagnosis present

## 2024-01-17 DIAGNOSIS — F259 Schizoaffective disorder, unspecified: Secondary | ICD-10-CM | POA: Diagnosis present

## 2024-01-17 DIAGNOSIS — L02419 Cutaneous abscess of limb, unspecified: Secondary | ICD-10-CM

## 2024-01-17 DIAGNOSIS — L0390 Cellulitis, unspecified: Secondary | ICD-10-CM | POA: Diagnosis present

## 2024-01-17 DIAGNOSIS — J4520 Mild intermittent asthma, uncomplicated: Secondary | ICD-10-CM | POA: Diagnosis present

## 2024-01-17 DIAGNOSIS — L03119 Cellulitis of unspecified part of limb: Secondary | ICD-10-CM | POA: Diagnosis not present

## 2024-01-17 DIAGNOSIS — L03114 Cellulitis of left upper limb: Secondary | ICD-10-CM | POA: Diagnosis present

## 2024-01-17 DIAGNOSIS — Z79899 Other long term (current) drug therapy: Secondary | ICD-10-CM | POA: Diagnosis not present

## 2024-01-17 DIAGNOSIS — L02414 Cutaneous abscess of left upper limb: Secondary | ICD-10-CM | POA: Diagnosis present

## 2024-01-17 DIAGNOSIS — M79632 Pain in left forearm: Secondary | ICD-10-CM | POA: Diagnosis present

## 2024-01-17 DIAGNOSIS — M795 Residual foreign body in soft tissue: Secondary | ICD-10-CM | POA: Diagnosis present

## 2024-01-17 DIAGNOSIS — B20 Human immunodeficiency virus [HIV] disease: Secondary | ICD-10-CM | POA: Diagnosis present

## 2024-01-17 DIAGNOSIS — I96 Gangrene, not elsewhere classified: Secondary | ICD-10-CM | POA: Diagnosis present

## 2024-01-17 LAB — BLOOD CULTURE ID PANEL (REFLEXED) - BCID2

## 2024-01-17 LAB — CBC WITH DIFFERENTIAL/PLATELET
Abs Immature Granulocytes: 0.04 10*3/uL (ref 0.00–0.07)
Basophils Absolute: 0 10*3/uL (ref 0.0–0.1)
Basophils Relative: 0 %
Eosinophils Absolute: 0.3 10*3/uL (ref 0.0–0.5)
Eosinophils Relative: 4 %
HCT: 41.4 % (ref 39.0–52.0)
Hemoglobin: 13.7 g/dL (ref 13.0–17.0)
Immature Granulocytes: 0 %
Lymphocytes Relative: 31 %
Lymphs Abs: 2.8 10*3/uL (ref 0.7–4.0)
MCH: 31 pg (ref 26.0–34.0)
MCHC: 33.1 g/dL (ref 30.0–36.0)
MCV: 93.7 fL (ref 80.0–100.0)
Monocytes Absolute: 0.5 10*3/uL (ref 0.1–1.0)
Monocytes Relative: 6 %
Neutro Abs: 5.2 10*3/uL (ref 1.7–7.7)
Neutrophils Relative %: 59 %
Platelets: 302 10*3/uL (ref 150–400)
RBC: 4.42 MIL/uL (ref 4.22–5.81)
RDW: 14 % (ref 11.5–15.5)
WBC: 9 10*3/uL (ref 4.0–10.5)
nRBC: 0 % (ref 0.0–0.2)

## 2024-01-17 LAB — CBC
HCT: 37.7 % — ABNORMAL LOW (ref 39.0–52.0)
Hemoglobin: 12.4 g/dL — ABNORMAL LOW (ref 13.0–17.0)
MCH: 31.2 pg (ref 26.0–34.0)
MCHC: 32.9 g/dL (ref 30.0–36.0)
MCV: 94.7 fL (ref 80.0–100.0)
Platelets: 264 10*3/uL (ref 150–400)
RBC: 3.98 MIL/uL — ABNORMAL LOW (ref 4.22–5.81)
RDW: 13.8 % (ref 11.5–15.5)
WBC: 7.9 10*3/uL (ref 4.0–10.5)
nRBC: 0 % (ref 0.0–0.2)

## 2024-01-17 LAB — COMPREHENSIVE METABOLIC PANEL
ALT: 18 U/L (ref 0–44)
AST: 20 U/L (ref 15–41)
Albumin: 3.7 g/dL (ref 3.5–5.0)
Alkaline Phosphatase: 51 U/L (ref 38–126)
Anion gap: 10 (ref 5–15)
BUN: 11 mg/dL (ref 6–20)
CO2: 25 mmol/L (ref 22–32)
Calcium: 9 mg/dL (ref 8.9–10.3)
Chloride: 101 mmol/L (ref 98–111)
Creatinine, Ser: 1.07 mg/dL (ref 0.61–1.24)
GFR, Estimated: >60 mL/min (ref >60)
Glucose, Bld: 86 mg/dL (ref 70–99)
Potassium: 4.2 mmol/L (ref 3.5–5.1)
Sodium: 136 mmol/L (ref 135–145)
Total Bilirubin: 0.4 mg/dL (ref 0.0–1.2)
Total Protein: 7.6 g/dL (ref 6.5–8.1)

## 2024-01-17 LAB — T-HELPER CELLS (CD4) COUNT (NOT AT ARMC)
CD4 % Helper T Cell: 36 % (ref 33–65)
CD4 T Cell Abs: 919 /uL (ref 400–1790)

## 2024-01-17 LAB — CREATININE, SERUM
Creatinine, Ser: 1.22 mg/dL (ref 0.61–1.24)
GFR, Estimated: >60 mL/min (ref >60)

## 2024-01-17 MED ORDER — ACETAMINOPHEN 650 MG RE SUPP: 650.0000 mg | Freq: Four times a day (QID) | RECTAL | Status: DC | PRN

## 2024-01-17 MED ORDER — ARIPIPRAZOLE ER 400 MG IM SRER: 300.0000 mg | INTRAMUSCULAR | Status: DC

## 2024-01-17 MED ORDER — KETOROLAC TROMETHAMINE 30 MG/ML IJ SOLN: 30.0000 mg | Freq: Four times a day (QID) | INTRAMUSCULAR | Status: DC | PRN

## 2024-01-17 MED ORDER — VANCOMYCIN HCL 1500 MG/300ML IV SOLN
1500.0000 mg | Freq: Two times a day (BID) | INTRAVENOUS | Status: DC
  Administered 2024-01-17 – 2024-01-18 (×4): 1500 mg via INTRAVENOUS
  Filled 2024-01-17 (×6): qty 300

## 2024-01-17 MED ORDER — SODIUM CHLORIDE 0.9 % IV SOLN
2.0000 g | Freq: Three times a day (TID) | INTRAVENOUS | Status: DC
  Administered 2024-01-17 – 2024-01-19 (×7): 2 g via INTRAVENOUS
  Filled 2024-01-17 (×8): qty 12.5

## 2024-01-17 MED ORDER — BICTEGRAVIR-EMTRICITAB-TENOFOV 50-200-25 MG PO TABS
1.0000 | ORAL_TABLET | Freq: Every day | ORAL | Status: DC
  Administered 2024-01-17 – 2024-01-19 (×3): 1 via ORAL
  Filled 2024-01-17 (×3): qty 1

## 2024-01-17 MED ORDER — KETOROLAC TROMETHAMINE 15 MG/ML IJ SOLN
15.0000 mg | Freq: Once | INTRAMUSCULAR | Status: AC
  Administered 2024-01-17: 15 mg via INTRAVENOUS
  Filled 2024-01-17: qty 1

## 2024-01-17 MED ORDER — VANCOMYCIN HCL IN DEXTROSE 1-5 GM/200ML-% IV SOLN: 1000.0000 mg | Freq: Once | INTRAVENOUS | Status: DC

## 2024-01-17 MED ORDER — ACETAMINOPHEN 325 MG PO TABS: 650.0000 mg | ORAL_TABLET | Freq: Four times a day (QID) | ORAL | Status: DC | PRN

## 2024-01-17 MED ORDER — ONDANSETRON HCL 4 MG/2ML IJ SOLN: 4.0000 mg | Freq: Four times a day (QID) | INTRAMUSCULAR | Status: DC | PRN

## 2024-01-17 MED ORDER — TETANUS-DIPHTH-ACELL PERTUSSIS 5-2.5-18.5 LF-MCG/0.5 IM SUSY: 0.5000 mL | PREFILLED_SYRINGE | Freq: Once | INTRAMUSCULAR | Status: DC

## 2024-01-17 MED ORDER — ONDANSETRON HCL 4 MG PO TABS: 4.0000 mg | ORAL_TABLET | Freq: Four times a day (QID) | ORAL | Status: DC | PRN

## 2024-01-17 MED ORDER — HEPARIN SODIUM (PORCINE) 5000 UNIT/ML IJ SOLN
5000.0000 [IU] | Freq: Three times a day (TID) | INTRAMUSCULAR | Status: DC
  Administered 2024-01-17 – 2024-01-19 (×7): 5000 [IU] via SUBCUTANEOUS
  Filled 2024-01-17 (×7): qty 1

## 2024-01-17 NOTE — Progress Notes (Signed)
PHARMACY - PHYSICIAN COMMUNICATION CRITICAL VALUE ALERT - BLOOD CULTURE IDENTIFICATION (BCID)  Kenneth Pepitone. is an 38 y.o. male who presented to [facility] on 01/16/2024 with a chief complaint of L upper extremity cellulitis and several small subcutanous abscesses.  Assessment: Has hx of HIV and IV drug use. WBC WNL, afebrile. Ortho consulted for possible drainage for L upper extremity cellulitis and small subcutaneous abscesses. Bcx 1/4 GPC with BCID showing staph epi (no resistance).   Name of physician (or Provider) Contacted: Anthoney Harada, NP  Current antibiotics: cefepime and vancomycin  Changes to prescribed antibiotics recommended:  Given only Bcx only 1/4 and plan for ortho consult for possible abscess drainage, will continue broad coverage for now with de-escalation pending cultures.  Results for orders placed or performed during the hospital encounter of 01/16/24  Blood Culture ID Panel (Reflexed) (Collected: 01/16/2024 11:31 PM)  Result Value Ref Range   Enterococcus faecalis NOT DETECTED NOT DETECTED   Enterococcus Faecium NOT DETECTED NOT DETECTED   Listeria monocytogenes NOT DETECTED NOT DETECTED   Staphylococcus species DETECTED (A) NOT DETECTED   Staphylococcus aureus (BCID) NOT DETECTED NOT DETECTED   Staphylococcus epidermidis DETECTED (A) NOT DETECTED   Staphylococcus lugdunensis NOT DETECTED NOT DETECTED   Streptococcus species NOT DETECTED NOT DETECTED   Streptococcus agalactiae NOT DETECTED NOT DETECTED   Streptococcus pneumoniae NOT DETECTED NOT DETECTED   Streptococcus pyogenes NOT DETECTED NOT DETECTED   A.calcoaceticus-baumannii NOT DETECTED NOT DETECTED   Bacteroides fragilis NOT DETECTED NOT DETECTED   Enterobacterales NOT DETECTED NOT DETECTED   Enterobacter cloacae complex NOT DETECTED NOT DETECTED   Escherichia coli NOT DETECTED NOT DETECTED   Klebsiella aerogenes NOT DETECTED NOT DETECTED   Klebsiella oxytoca NOT DETECTED NOT DETECTED    Klebsiella pneumoniae NOT DETECTED NOT DETECTED   Proteus species NOT DETECTED NOT DETECTED   Salmonella species NOT DETECTED NOT DETECTED   Serratia marcescens NOT DETECTED NOT DETECTED   Haemophilus influenzae NOT DETECTED NOT DETECTED   Neisseria meningitidis NOT DETECTED NOT DETECTED   Pseudomonas aeruginosa NOT DETECTED NOT DETECTED   Stenotrophomonas maltophilia NOT DETECTED NOT DETECTED   Candida albicans NOT DETECTED NOT DETECTED   Candida auris NOT DETECTED NOT DETECTED   Candida glabrata NOT DETECTED NOT DETECTED   Candida krusei NOT DETECTED NOT DETECTED   Candida parapsilosis NOT DETECTED NOT DETECTED   Candida tropicalis NOT DETECTED NOT DETECTED   Cryptococcus neoformans/gattii NOT DETECTED NOT DETECTED   Methicillin resistance mecA/C NOT DETECTED NOT DETECTED    Thank you for allowing pharmacy to participate in this patient's care,  Sherron Monday, PharmD, BCCCP Clinical Pharmacist  Phone: [phone] 01/17/2024 9:42 PM  Please check AMION for all [facility] phone numbers After 10:00 PM, call Main Pharmacy [phone]

## 2024-01-17 NOTE — Progress Notes (Signed)
This is a 37 year old gentleman with history of HIV, IV drug abuse, asthma and schizophrenia who presented to ED with wounds and pain in bilateral upper extremities, more so in the left upper extremity, admitted under hospitalist service after midnight and workup indicated left upper extremity cellulitis and several small subcutaneous abscesses.  Patient was started on cefepime and vancomycin, per H&P, orthopedics has been consulted to evaluate for possible abscess drainages.  Patient seen and examined in the ED.  He is symptom-free, no pain.  On examination, he does appear to have edema in bilateral upper extremities, although left arm is slightly warm warmth and right but there is no tenderness I do not, he has several superficial wounds, none of them is draining any pus and no weeping that I noticed today.  Erythema is also very subtle.  No shortness of breath, lungs clear to auscultation.  Continue antibiotics, await orthopedic evaluation.  Vitals are stable and he is afebrile.  No leukocytosis.  Blood cultures pending.

## 2024-01-17 NOTE — Progress Notes (Signed)
Pharmacy Antibiotic Note  Kenneth Welch. is a 38 y.o. male admitted on 01/16/2024 with  cellulitis/abscesses of the bilateral upper extremities .  Pharmacy has been consulted for Vancomycin/Cefepime dosing. WBC WNL. Renal function good. HIV+.   Plan: Vancomycin 1500 mg IV q12h >>>Estimated AUC: 532 Cefepime 2g IV q8h Trend WBC, temp, renal function  F/U infectious work-up Drug levels as indicated   Height: 6\' 3"  (190.5 cm) Weight: 79.4 kg (175 lb) IBW/kg (Calculated) : 84.5  Temp (24hrs), Avg:98.6 F (37 C), Min:98.1 F (36.7 C), Max:99.1 F (37.3 C)  Recent Labs  Lab 01/16/24 2331 01/16/24 2337  WBC 9.0  --   CREATININE 1.07  --   LATICACIDVEN  --  1.2    Estimated Creatinine Clearance: 107.2 mL/min (by C-G formula based on SCr of 1.07 mg/dL).    No Known Allergies  Abran Duke, PharmD, BCPS Clinical Pharmacist Phone: [phone]

## 2024-01-17 NOTE — H&P (Addendum)
History and Physical    Kenneth Welch. ZOX:[id number] DOB: [birth year] DOA: 01/16/2024  PCP: [facility]  Patient coming from: home  I have personally briefly reviewed patient's old medical records in [facility]  Chief Complaint: multiple skin abscesses in IVDU  HPI: Weiland Tomich. is a 38 y.o. male with medical history significant of HIV, Asthma, Methamphetamine use disorder, Schizophrenia, who presents to ED due multiple infected sores on b/l upper extremity progressive x 2 days. Per patient prior to skin infection he was shooting methamphetamine. Patient notes no fever/chills but notes sores are painful.   ED Course:  99.1, bp 134/82, hr 105, rr 16 sat 97%  Na 136, K 4.2, CL 101, glu 86 cr 1.07,  Wbc 9, hgb 13.7, plt302 Lactic 1.2 ON evaluation in ED patient also found to have retention of needle fragment in left arm.  Xray right arm MPRESSION: 1. Multiple areas of focal swelling with locules of gas suspicious for abscesses in the left forearm. No evidence of osteomyelitis. 2. 8 mm thin metallic density in the proximal left forearm suspicious for needle fragment. 3. Acute fracture evidence of osteomyelitis in the right forearm.  Xray right arm mpression #3 should state "NO acute fracture or evidence of osteomyelitis in the right forearm."  Tx vanc/cefepime,ketorlac Review of Systems: As per HPI otherwise 10 point review of systems negative.   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 No pertinent surgical history.   reports that he has never smoked. He has never used smokeless tobacco. He reports current drug use. Drug: Marijuana. He reports that he does not drink alcohol.  No Known Allergies  History reviewed. No pertinent family history.  Prior to Admission medications   Medication Sig Start Date End Date Taking? Authorizing Provider  ARIPiprazole ER (ABILIFY MAINTENA) 400 MG SRER injection Inject 1.5 mLs (300 mg total) into the muscle every 28 (twenty-eight) days. 12/28/23  Yes Golda Acre, MD  bictegravir-emtricitabine-tenofovir AF (BIKTARVY) 50-200-25 MG TABS tablet Take 1 tablet by mouth daily. 11/30/23 04/27/24 Yes Vu, Tonita Phoenix, MD  cloNIDine HCl (KAPVAY) 0.1 MG TB12 ER tablet Take 1 tablet (0.1 mg total) by mouth in the morning. Patient not taking: Reported on 01/17/2024 12/02/23   Golda Acre, MD  escitalopram (LEXAPRO) 5 MG tablet Take 1 tablet (5 mg total) by mouth at bedtime. 12/01/23   Golda Acre, MD  traZODone (DESYREL) 50 MG tablet Take 0.5 tablets (25 mg total) by mouth at bedtime as needed and may repeat dose one time if needed for sleep. 12/01/23   Golda Acre, MD    Physical Exam: Vitals:   01/16/24 2259 01/16/24 2301 01/17/24 0321 01/17/24 0322  BP: 134/82  109/68   Pulse: (!) 105  79   Resp: 16  16   Temp: 99.1 F (37.3 C)   98.1 F (36.7 C)  TempSrc: Oral   Oral  SpO2: 97%  100%   Weight:  79.4 kg    Height:  6\' 3"  (1.905 m)      Constitutional: NAD, calm, comfortable Vitals:   01/16/24 2259 01/16/24 2301 01/17/24 0321 01/17/24 0322  BP: 134/82  109/68   Pulse: (!) 105  79   Resp: 16  16   Temp: 99.1 F (37.3 C)   98.1 F (36.7 C)  TempSrc: Oral   Oral  SpO2: 97%  100%   Weight:  79.4 kg    Height:  6\' 3"  (1.905 m)     Eyes: PERRL, lids and conjunctivae normal ENMT: Mucous membranes are moist. Posterior pharynx clear of any exudate or lesions.Normal dentition.  Neck: normal, supple, no masses, no  thyromegaly Respiratory: clear to auscultation bilaterally, no wheezing, no crackles. Normal respiratory effort. No accessory muscle use.  Cardiovascular: Regular rate and rhythm, no murmurs / rubs / gallops. No extremity edema. 2+ pedal pulses.  Abdomen: no tenderness, no masses palpated. No hepatosplenomegaly. Bowel sounds positive.  Musculoskeletal: no clubbing / cyanosis. No joint deformity upper and lower extremities. Good ROM, no contractures. Normal muscle tone.  Skin: b/l upper extremity abscess at injection sites  Neurologic: CN 2-12 grossly intact. Sensation intact,  Strength 5/5 in all 4.  Psychiatric: Normal judgment and insight. Alert and oriented x 3. Normal mood.    Labs on Admission: I have personally reviewed following labs and imaging studies  CBC: Recent Labs  Lab 01/16/24 2331  WBC 9.0  NEUTROABS 5.2  HGB 13.7  HCT 41.4  MCV 93.7  PLT 302   Basic Metabolic Panel: Recent Labs  Lab 01/16/24 2331  NA 136  K 4.2  CL 101  CO2 25  GLUCOSE 86  BUN 11  CREATININE 1.07  CALCIUM 9.0   GFR: Estimated Creatinine Clearance: 107.2 mL/min (by C-G formula based on SCr of 1.07 mg/dL). Liver Function Tests: Recent Labs  Lab 01/16/24 2331  AST 20  ALT 18  ALKPHOS 51  BILITOT 0.4  PROT 7.6  ALBUMIN 3.7   No results for input(s): "LIPASE", "AMYLASE" in the last 168 hours. No results for input(s): "AMMONIA" in the last 168 hours. Coagulation Profile: Recent Labs  Lab 01/16/24 2331  INR 1.0   Cardiac Enzymes: No results for input(s): "CKTOTAL", "CKMB", "CKMBINDEX", "TROPONINI" in the last 168 hours. BNP (last 3 results) No results for input(s): "PROBNP" in the last 8760 hours. HbA1C: No results for input(s): "HGBA1C" in the last 72 hours. CBG: No results for input(s): "GLUCAP" in the last 168 hours. Lipid Profile: No results for input(s): "CHOL", "HDL", "LDLCALC", "TRIG", "CHOLHDL", "LDLDIRECT" in the last 72 hours. Thyroid Function Tests: No results  for input(s): "TSH", "T4TOTAL", "FREET4", "T3FREE", "THYROIDAB" in the last 72 hours. Anemia Panel: No results for input(s): "VITAMINB12", "FOLATE", "FERRITIN", "TIBC", "IRON", "RETICCTPCT" in the last 72 hours. Urine analysis:    Component Value Date/Time   COLORURINE YELLOW 06/13/2023 1811   APPEARANCEUR CLEAR 06/13/2023 1811   LABSPEC 1.026 06/13/2023 1811   PHURINE 5.0 06/13/2023 1811   GLUCOSEU NEGATIVE 06/13/2023 1811   HGBUR NEGATIVE 06/13/2023 1811   BILIRUBINUR NEGATIVE 06/13/2023 1811   KETONESUR NEGATIVE 06/13/2023 1811   PROTEINUR NEGATIVE 06/13/2023 1811   UROBILINOGEN 0.2 11/26/2018 1233   NITRITE NEGATIVE 06/13/2023 1811   LEUKOCYTESUR NEGATIVE 06/13/2023 1811    Radiological Exams on Admission: DG Forearm Right Addendum Date: 01/17/2024 ADDENDUM REPORT: 01/17/2024 01:00 ADDENDUM: Addendum to correct dictation error. Impression #3 should state "NO acute fracture or evidence of osteomyelitis in the right forearm." This change was discussed by telephone on  01/17/2024 at 1:00 am to provider BROOKE SMALL , who verbally acknowledged these results. Electronically Signed   By: Minerva Fester M.D.   On: 01/17/2024 01:00   Result Date: 01/17/2024 CLINICAL DATA:  Encounter for abscess and IV drug use. Multiple infected sores on the bilateral lower arms. EXAM: RIGHT FOREARM - 2 VIEW; LEFT FOREARM - 2 VIEW COMPARISON:  None Available. FINDINGS: Left: Soft tissue swelling about the left forearm. Multiple focal areas of soft tissue swelling containing locules of gas compatible with abscesses. Thin 8 mm linear metallic density likely a foreign body in the ulnar/volar proximal forearm. No acute fracture. No radiographic evidence of osteomyelitis. Right: No acute fracture or dislocation. No radiographic evidence of osteomyelitis. Mild soft tissue swelling. IMPRESSION: 1. Multiple areas of focal swelling with locules of gas suspicious for abscesses in the left forearm. No evidence of  osteomyelitis. 2. 8 mm thin metallic density in the proximal left forearm suspicious for needle fragment. 3. Acute fracture evidence of osteomyelitis in the right forearm. Electronically Signed: By: Minerva Fester M.D. On: 01/17/2024 00:24   DG Forearm Left Addendum Date: 01/17/2024 ADDENDUM REPORT: 01/17/2024 01:00 ADDENDUM: Addendum to correct dictation error. Impression #3 should state "NO acute fracture or evidence of osteomyelitis in the right forearm." This change was discussed by telephone on 01/17/2024 at 1:00 am to provider BROOKE SMALL , who verbally acknowledged these results. Electronically Signed   By: Minerva Fester M.D.   On: 01/17/2024 01:00   Result Date: 01/17/2024 CLINICAL DATA:  Encounter for abscess and IV drug use. Multiple infected sores on the bilateral lower arms. EXAM: RIGHT FOREARM - 2 VIEW; LEFT FOREARM - 2 VIEW COMPARISON:  None Available. FINDINGS: Left: Soft tissue swelling about the left forearm. Multiple focal areas of soft tissue swelling containing locules of gas compatible with abscesses. Thin 8 mm linear metallic density likely a foreign body in the ulnar/volar proximal forearm. No acute fracture. No radiographic evidence of osteomyelitis. Right: No acute fracture or dislocation. No radiographic evidence of osteomyelitis. Mild soft tissue swelling. IMPRESSION: 1. Multiple areas of focal swelling with locules of gas suspicious for abscesses in the left forearm. No evidence of osteomyelitis. 2. 8 mm thin metallic density in the proximal left forearm suspicious for needle fragment. 3. Acute fracture evidence of osteomyelitis in the right forearm. Electronically Signed: By: Minerva Fester M.D. On: 01/17/2024 00:24    EKG: Independently reviewed. N/a  Assessment/Plan  B/L upper extremity skin abscess  Retained needle fragment left arm -in setting of skin popping methamphetamine  - start broad spectrum abx  -orthopedics for evaluation of foreign object  -supportive  care    HIV -resume anti-virals once med rec completed  -check cd4   Asthma, mild intermittent  -prn nebs    Methamphetamine use disorder -social work to assist with substance abuse referral    Schizophrenia -continue Abilify     DVT prophylaxis: heparin Code Status: full/ as discussed per patient wishes in event of cardiac arrest  Family Communication: none ta bedside Disposition Plan patient  expected to be admitted greater than 2 midnights  Consults called: Orthopedics Admission status: cardiac tele   Lurline Del MD [facility]   If 7PM-7AM, please contact night-coverage [email or web address] Password [facility]  01/17/2024, 3:37 AM

## 2024-01-18 ENCOUNTER — Inpatient Hospital Stay (HOSPITAL_COMMUNITY): Payer: MEDICAID

## 2024-01-18 ENCOUNTER — Other Ambulatory Visit (HOSPITAL_COMMUNITY): Payer: Self-pay

## 2024-01-18 DIAGNOSIS — L03119 Cellulitis of unspecified part of limb: Secondary | ICD-10-CM | POA: Diagnosis not present

## 2024-01-18 LAB — BASIC METABOLIC PANEL
Anion gap: 12 (ref 5–15)
BUN: 13 mg/dL (ref 6–20)
CO2: 25 mmol/L (ref 22–32)
Calcium: 9.3 mg/dL (ref 8.9–10.3)
Chloride: 101 mmol/L (ref 98–111)
Creatinine, Ser: 1.16 mg/dL (ref 0.61–1.24)
GFR, Estimated: >60 mL/min (ref >60)
Glucose, Bld: 89 mg/dL (ref 70–99)
Potassium: 4.1 mmol/L (ref 3.5–5.1)
Sodium: 138 mmol/L (ref 135–145)

## 2024-01-18 LAB — CBC WITH DIFFERENTIAL/PLATELET
Abs Immature Granulocytes: 0 10*3/uL (ref 0.00–0.07)
Basophils Absolute: 0 10*3/uL (ref 0.0–0.1)
Basophils Relative: 1 %
Eosinophils Absolute: 0.3 10*3/uL (ref 0.0–0.5)
Eosinophils Relative: 7 %
HCT: 41 % (ref 39.0–52.0)
Hemoglobin: 13.7 g/dL (ref 13.0–17.0)
Immature Granulocytes: 0 %
Lymphocytes Relative: 43 %
Lymphs Abs: 2 10*3/uL (ref 0.7–4.0)
MCH: 30.8 pg (ref 26.0–34.0)
MCHC: 33.4 g/dL (ref 30.0–36.0)
MCV: 92.1 fL (ref 80.0–100.0)
Monocytes Absolute: 0.3 10*3/uL (ref 0.1–1.0)
Monocytes Relative: 6 %
Neutro Abs: 2 10*3/uL (ref 1.7–7.7)
Neutrophils Relative %: 43 %
Platelets: 272 10*3/uL (ref 150–400)
RBC: 4.45 MIL/uL (ref 4.22–5.81)
RDW: 13.6 % (ref 11.5–15.5)
WBC: 4.7 10*3/uL (ref 4.0–10.5)
nRBC: 0 % (ref 0.0–0.2)

## 2024-01-18 LAB — CD4/CD8 (T-HELPER/T-SUPPRESSOR CELL)
CD4 absolute: 833 /uL (ref 400–1790)
CD4%: 39.16 % (ref 33–65)
CD8 T Cell Abs: 742 /uL (ref 190–1000)
CD8tox: 34.88 % (ref 12–40)
Ratio: 1.12 (ref 1.0–3.0)
Total lymphocyte count: 2126 /uL (ref 1000–4000)

## 2024-01-18 MED ORDER — IOHEXOL 350 MG/ML SOLN
75.0000 mL | Freq: Once | INTRAVENOUS | Status: AC | PRN
  Administered 2024-01-18: 75 mL via INTRAVENOUS

## 2024-01-18 MED ORDER — BIKTARVY 50-200-25 MG PO TABS
1.0000 | ORAL_TABLET | Freq: Every day | ORAL | 0 refills | Status: DC
  Filled 2024-01-18: qty 30, 30d supply, fill #0

## 2024-01-18 NOTE — Progress Notes (Signed)
PROGRESS NOTE    Kenneth Welch.  ZOX:[id number] DOB: Aug 10, 1986 DOA: 01/16/2024 PCP: [facility]   Brief Narrative:  his is a 38 year old gentleman with history of HIV, IV drug abuse, asthma and schizophrenia who presented to ED with wounds and pain in bilateral upper extremities, more so in the left upper extremity, admitted under hospitalist service after midnight and workup indicated left upper extremity cellulitis and several small subcutaneous abscesses. Patient was started on cefepime and vancomycin, Ortho consulted.  Assessment & Plan:   Principal Problem:   Cellulitis  Cellulitis and multiple areas of abscesses in the left forearm, POA: This was confirmed on the x-ray, multiple areas of gas suspicion for abscess, locules.  Also appears to have needle fragment in the left arm as well.  Ortho was consulted upon admission, per H&P, patient has not been seen yet.  I have sent a message to Earney Hamburg today.  Patient remains on Cefepime and vancomycin, cultures are negative.  Wonder if we need to do MRI or CT of the left upper extremity however we will await further recommendations by orthopedics.  HIV: Continue home medications.  Mild intermittent asthma: Asymptomatic.  Continue as needed nebs.  Methamphetamine use disorder: Social work consulted to provide with resources.  Schizophrenia: Continue Abilify.  DVT prophylaxis: heparin injection 5,000 Units Start: 01/17/24 0600   Code Status: Full Code  Family Communication:  None present at bedside.  Plan of care discussed with patient in length and he/she verbalized understanding and agreed with it.  Status is: Inpatient Remains inpatient appropriate because: Awaiting Ortho evaluation   Estimated body mass index is 21.87 kg/m as calculated from the following:   Height as of this encounter: 6\' 3"  (1.905 m).   Weight as of this encounter: 79.4 kg.    Nutritional Assessment: Body mass index is 21.87  kg/m.Marland Kitchen Seen by dietician.  I agree with the assessment and plan as outlined below: Nutrition Status:        . Skin Assessment: I have examined the patient's skin and I agree with the wound assessment as performed by the wound care RN as outlined below:    Consultants:  ortho  Procedures:  None  Antimicrobials:  Anti-infectives (From admission, onward)    Start     Dose/Rate Route Frequency Ordered Stop   01/17/24 1000  bictegravir-emtricitabine-tenofovir AF (BIKTARVY) 50-200-25 MG per tablet 1 tablet        1 tablet Oral Daily 01/17/24 0402     01/17/24 1000  vancomycin (VANCOREADY) IVPB 1500 mg/300 mL        1,500 mg 150 mL/hr over 120 Minutes Intravenous Every 12 hours 01/17/24 0416     01/17/24 0415  vancomycin (VANCOCIN) IVPB 1000 mg/200 mL premix  Status:  Discontinued        1,000 mg 200 mL/hr over 60 Minutes Intravenous  Once 01/17/24 0402 01/17/24 0405   01/17/24 0415  ceFEPIme (MAXIPIME) 2 g in sodium chloride 0.9 % 100 mL IVPB        2 g 200 mL/hr over 30 Minutes Intravenous Every 8 hours 01/17/24 0409     01/17/24 0030  vancomycin (VANCOREADY) IVPB 1500 mg/300 mL        1,500 mg 150 mL/hr over 120 Minutes Intravenous  Once 01/16/24 2359 01/17/24 0316   01/17/24 0000  vancomycin (VANCOCIN) IVPB 1000 mg/200 mL premix  Status:  Discontinued        1,000 mg 200 mL/hr over 60 Minutes Intravenous  Once 01/16/24 2357 01/16/24 2359         Subjective: Seen and examined, still complains of the left arm pain which is improving.  No other complaint.  He is afebrile.  Objective: Vitals:   01/18/24 0002 01/18/24 0430 01/18/24 0530 01/18/24 0911  BP: 95/66 (!) 97/51 112/68 109/71  Pulse: 76 70  83  Resp: 18 18  18   Temp: 98.5 F (36.9 C) 97.7 F (36.5 C)  98.2 F (36.8 C)  TempSrc: Oral Oral  Oral  SpO2: 100% 98%  100%  Weight:      Height:        Intake/Output Summary (Last 24 hours) at 01/18/2024 1307 Last data filed at 01/18/2024 0559 Gross per 24 hour   Intake 1200 ml  Output --  Net 1200 ml   Filed Weights   01/16/24 2301  Weight: 79.4 kg    Examination:  General exam: Appears calm and comfortable  Respiratory system: Clear to auscultation. Respiratory effort normal. Cardiovascular system: S1 & S2 heard, RRR. No JVD, murmurs, rubs, gallops or clicks. No pedal edema. Gastrointestinal system: Abdomen is nondistended, soft and nontender. No organomegaly or masses felt. Normal bowel sounds heard. Central nervous system: Alert and oriented. No focal neurological deficits. Extremities: Multiple firm nodules in bilateral upper extremities, more on the left upper extremity, left upper extremity is slightly swollen today compared to the right and slightly warm today as well.  Slightly tender as well.  Data Reviewed: I have personally reviewed following labs and imaging studies  CBC: Recent Labs  Lab 01/16/24 2331 01/17/24 0552 01/18/24 0605  WBC 9.0 7.9 4.7  NEUTROABS 5.2  --  2.0  HGB 13.7 12.4* 13.7  HCT 41.4 37.7* 41.0  MCV 93.7 94.7 92.1  PLT 302 264 272   Basic Metabolic Panel: Recent Labs  Lab 01/16/24 2331 01/17/24 0552 01/18/24 0605  NA 136  --  138  K 4.2  --  4.1  CL 101  --  101  CO2 25  --  25  GLUCOSE 86  --  89  BUN 11  --  13  CREATININE 1.07 1.22 1.16  CALCIUM 9.0  --  9.3   GFR: Estimated Creatinine Clearance: 98.9 mL/min (by C-G formula based on SCr of 1.16 mg/dL). Liver Function Tests: Recent Labs  Lab 01/16/24 2331  AST 20  ALT 18  ALKPHOS 51  BILITOT 0.4  PROT 7.6  ALBUMIN 3.7   No results for input(s): "LIPASE", "AMYLASE" in the last 168 hours. No results for input(s): "AMMONIA" in the last 168 hours. Coagulation Profile: Recent Labs  Lab 01/16/24 2331  INR 1.0   Cardiac Enzymes: No results for input(s): "CKTOTAL", "CKMB", "CKMBINDEX", "TROPONINI" in the last 168 hours. BNP (last 3 results) No results for input(s): "PROBNP" in the last 8760 hours. HbA1C: No results for  input(s): "HGBA1C" in the last 72 hours. CBG: No results for input(s): "GLUCAP" in the last 168 hours. Lipid Profile: No results for input(s): "CHOL", "HDL", "LDLCALC", "TRIG", "CHOLHDL", "LDLDIRECT" in the last 72 hours. Thyroid Function Tests: No results for input(s): "TSH", "T4TOTAL", "FREET4", "T3FREE", "THYROIDAB" in the last 72 hours. Anemia Panel: No results for input(s): "VITAMINB12", "FOLATE", "FERRITIN", "TIBC", "IRON", "RETICCTPCT" in the last 72 hours. Sepsis Labs: Recent Labs  Lab 01/16/24 2337  LATICACIDVEN 1.2    Recent Results (from the past 240 hours)  Culture, blood (Routine x 2)     Status: None (Preliminary result)   Collection Time: 01/16/24 11:27  PM   Specimen: BLOOD RIGHT ARM  Result Value Ref Range Status   Specimen Description BLOOD RIGHT ARM  Final   Special Requests   Final    BOTTLES DRAWN AEROBIC AND ANAEROBIC Blood Culture adequate volume   Culture   Final    NO GROWTH 1 DAY Performed at [facility], [address] [place], [place] [address]    Report Status PENDING  Incomplete  Culture, blood (Routine x 2)     Status: Abnormal (Preliminary result)   Collection Time: 01/16/24 11:31 PM   Specimen: BLOOD RIGHT HAND  Result Value Ref Range Status   Specimen Description BLOOD RIGHT HAND  Final   Special Requests   Final    BOTTLES DRAWN AEROBIC AND ANAEROBIC Blood Culture adequate volume   Culture  Setup Time   Final    GRAM POSITIVE COCCI IN CLUSTERS ANAEROBIC BOTTLE ONLY CRITICAL RESULT CALLED TO, READ BACK BY AND VERIFIED WITH: PHARMD K. HURTH [id number] @ 2121 FH    Culture (A)  Final    STAPHYLOCOCCUS EPIDERMIDIS THE SIGNIFICANCE OF ISOLATING THIS ORGANISM FROM A SINGLE SET OF BLOOD CULTURES WHEN MULTIPLE SETS ARE DRAWN IS UNCERTAIN. PLEASE NOTIFY THE MICROBIOLOGY DEPARTMENT WITHIN ONE WEEK IF SPECIATION AND SENSITIVITIES ARE REQUIRED. Performed at [facility], [address] [place], [place] [address]    Report Status PENDING   Incomplete  Blood Culture ID Panel (Reflexed)     Status: Abnormal   Collection Time: 01/16/24 11:31 PM  Result Value Ref Range Status   Enterococcus faecalis NOT DETECTED NOT DETECTED Final   Enterococcus Faecium NOT DETECTED NOT DETECTED Final   Listeria monocytogenes NOT DETECTED NOT DETECTED Final   Staphylococcus species DETECTED (A) NOT DETECTED Final    Comment: CRITICAL RESULT CALLED TO, READ BACK BY AND VERIFIED WITH: PHARMD K. HURTH [id number] @2121  FH    Staphylococcus aureus (BCID) NOT DETECTED NOT DETECTED Final   Staphylococcus epidermidis DETECTED (A) NOT DETECTED Final    Comment: CRITICAL RESULT CALLED TO, READ BACK BY AND VERIFIED WITH: PHARMD K. HURTH [id number] @2121  FH    Staphylococcus lugdunensis NOT DETECTED NOT DETECTED Final   Streptococcus species NOT DETECTED NOT DETECTED Final   Streptococcus agalactiae NOT DETECTED NOT DETECTED Final   Streptococcus pneumoniae NOT DETECTED NOT DETECTED Final   Streptococcus pyogenes NOT DETECTED NOT DETECTED Final   A.calcoaceticus-baumannii NOT DETECTED NOT DETECTED Final   Bacteroides fragilis NOT DETECTED NOT DETECTED Final   Enterobacterales NOT DETECTED NOT DETECTED Final   Enterobacter cloacae complex NOT DETECTED NOT DETECTED Final   Escherichia coli NOT DETECTED NOT DETECTED Final   Klebsiella aerogenes NOT DETECTED NOT DETECTED Final   Klebsiella oxytoca NOT DETECTED NOT DETECTED Final   Klebsiella pneumoniae NOT DETECTED NOT DETECTED Final   Proteus species NOT DETECTED NOT DETECTED Final   Salmonella species NOT DETECTED NOT DETECTED Final   Serratia marcescens NOT DETECTED NOT DETECTED Final   Haemophilus influenzae NOT DETECTED NOT DETECTED Final   Neisseria meningitidis NOT DETECTED NOT DETECTED Final   Pseudomonas aeruginosa NOT DETECTED NOT DETECTED Final   Stenotrophomonas maltophilia NOT DETECTED NOT DETECTED Final   Candida albicans NOT DETECTED NOT DETECTED Final   Candida auris NOT DETECTED NOT DETECTED  Final   Candida glabrata NOT DETECTED NOT DETECTED Final   Candida krusei NOT DETECTED NOT DETECTED Final   Candida parapsilosis NOT DETECTED NOT DETECTED Final   Candida tropicalis NOT DETECTED NOT DETECTED Final   Cryptococcus  neoformans/gattii NOT DETECTED NOT DETECTED Final   Methicillin resistance mecA/C NOT DETECTED NOT DETECTED Final    Comment: Performed at [facility], [address] [place], [place] [address]     Radiology Studies: DG Forearm Right Addendum Date: 01/17/2024 ADDENDUM REPORT: 01/17/2024 01:00 ADDENDUM: Addendum to correct dictation error. Impression #3 should state "NO acute fracture or evidence of osteomyelitis in the right forearm." This change was discussed by telephone on 01/17/2024 at 1:00 am to provider BROOKE SMALL , who verbally acknowledged these results. Electronically Signed   By: Minerva Fester M.D.   On: 01/17/2024 01:00   Result Date: 01/17/2024 CLINICAL DATA:  Encounter for abscess and IV drug use. Multiple infected sores on the bilateral lower arms. EXAM: RIGHT FOREARM - 2 VIEW; LEFT FOREARM - 2 VIEW COMPARISON:  None Available. FINDINGS: Left: Soft tissue swelling about the left forearm. Multiple focal areas of soft tissue swelling containing locules of gas compatible with abscesses. Thin 8 mm linear metallic density likely a foreign body in the ulnar/volar proximal forearm. No acute fracture. No radiographic evidence of osteomyelitis. Right: No acute fracture or dislocation. No radiographic evidence of osteomyelitis. Mild soft tissue swelling. IMPRESSION: 1. Multiple areas of focal swelling with locules of gas suspicious for abscesses in the left forearm. No evidence of osteomyelitis. 2. 8 mm thin metallic density in the proximal left forearm suspicious for needle fragment. 3. Acute fracture evidence of osteomyelitis in the right forearm. Electronically Signed: By: Minerva Fester M.D. On: 01/17/2024 00:24   DG Forearm Left Addendum Date:  01/17/2024 ADDENDUM REPORT: 01/17/2024 01:00 ADDENDUM: Addendum to correct dictation error. Impression #3 should state "NO acute fracture or evidence of osteomyelitis in the right forearm." This change was discussed by telephone on 01/17/2024 at 1:00 am to provider BROOKE SMALL , who verbally acknowledged these results. Electronically Signed   By: Minerva Fester M.D.   On: 01/17/2024 01:00   Result Date: 01/17/2024 CLINICAL DATA:  Encounter for abscess and IV drug use. Multiple infected sores on the bilateral lower arms. EXAM: RIGHT FOREARM - 2 VIEW; LEFT FOREARM - 2 VIEW COMPARISON:  None Available. FINDINGS: Left: Soft tissue swelling about the left forearm. Multiple focal areas of soft tissue swelling containing locules of gas compatible with abscesses. Thin 8 mm linear metallic density likely a foreign body in the ulnar/volar proximal forearm. No acute fracture. No radiographic evidence of osteomyelitis. Right: No acute fracture or dislocation. No radiographic evidence of osteomyelitis. Mild soft tissue swelling. IMPRESSION: 1. Multiple areas of focal swelling with locules of gas suspicious for abscesses in the left forearm. No evidence of osteomyelitis. 2. 8 mm thin metallic density in the proximal left forearm suspicious for needle fragment. 3. Acute fracture evidence of osteomyelitis in the right forearm. Electronically Signed: By: Minerva Fester M.D. On: 01/17/2024 00:24    Scheduled Meds:  [START ON 01/28/2024] ARIPiprazole ER  300 mg Intramuscular Q28 days   bictegravir-emtricitabine-tenofovir AF  1 tablet Oral Daily   heparin  5,000 Units Subcutaneous Q8H   Continuous Infusions:  ceFEPime (MAXIPIME) IV 2 g (01/18/24 0501)   vancomycin 1,500 mg (01/18/24 1009)     LOS: 1 day   Hughie Closs, MD [facility]  01/18/2024, 1:07 PM   *Please note that this is a verbal dictation therefore any spelling or grammatical errors are due to the "[employer]agon [employer] One" system  interpretation.  Please page via Amion and do not message via secure chat for urgent patient care matters.  Secure chat can be used for non urgent patient care matters.  How to contact the [facility] Attending or Consulting provider 7A - 7P or covering provider during after hours 7P -7A, for this patient?  Check the care team in [facility] and look for a) attending/consulting [facility] provider listed and b) the [facility] team listed. Page or secure chat 7A-7P. Log into [email or web address] and use [place]'s universal password to access. If you do not have the password, please contact the hospital operator. Locate the [facility] provider you are looking for under [facility] and page to a number that you can be directly reached. If you still have difficulty reaching the provider, please page the [facility] (Director on Call) for the Hospitalists listed on amion for assistance.

## 2024-01-18 NOTE — Progress Notes (Signed)
Pharmacy Transitions of Care Note: Antiretroviral Therapy   Patient Name: Kenneth Welch   Medication name: [name]   Assessment/Plan: Patient followed by Kenneth Welch for HIV care. Based on dispense history, patient was out of medication; talked to patient about prescription refills. After discussion with Kenneth Welch (via secure chat), Biktarvy prescription (30 day, no refills) sent to [facility] pharmacy. Of note, patient mentioned he was also out of his clonidine, prescribed by Kenneth Welch. Clonidine was discussed with primary team for possible continuation.   Please reach out with any questions or concerns,   Kenneth Welch, PharmD, BCPS Clinical Pharmacist Antimicrobial Stewardship  Available via secure chat

## 2024-01-18 NOTE — Plan of Care (Signed)
  Problem: Education: Goal: Knowledge of General Education information will improve Description: Including pain rating scale, medication(s)/side effects and non-pharmacologic comfort measures Outcome: Progressing   Problem: Health Behavior/Discharge Planning: Goal: Ability to manage health-related needs will improve Outcome: Progressing   Problem: Nutrition: Goal: Adequate nutrition will be maintained Outcome: Progressing   Problem: Clinical Measurements: Goal: Ability to avoid or minimize complications of infection will improve Outcome: Progressing

## 2024-01-18 NOTE — Discharge Instructions (Signed)
Intensive Outpatient Programs  [facility]    [facility] [address]     [address] [place],  [place]     [place], [place] [phone]      [phone]  [facility]   [facility]  (Inpatient and outpatient)  [phone] (Suboxone and Methadone) [address]           [phone]           ADS: Alcohol & Drug Services    [facility] - Intensive Outpatient [address]     [address] [place], [place] [address]     [place], [place]  [phone]      [phone]  [facility]o [facility]ye (Outpatient, Inpatient, Chemical  [facility] (Groups and Residental) (insurance only) [phone]    [place], [place]          [phone]       [facility]    [facility] (for caregivers and family) [address]     [address] [place], [place]     [place], [place] [phone]      [phone]  Residential Treatment Programs  [facility]  Work Farm(2 years) Residential: 90 days)  [facility] ([facility]      [address] [place], [place]     [place], [place] [phone]      [phone] or [phone]  [facility]    [facility] [address]      [address] [place], [place]     [place], [place] [phone]      [phone]  [facility]   Residential Treatment Services (RTS) [address]     [address] [place], [place] [address]     [place], [place] [phone]      [phone] Admissions: 8am-3pm M-F  BATS Program: Residential Program [phone] Days)              [facility]: [facility]  [place], [place]     [place], [place]  [phone] or [phone]    (Walk in Hours over the weekend or by referral)   Mobil Crisis: Therapeutic Alternatives:[phone] (for crisis  response 24 hours a day)   [employer]. If you are behind on your bills and expenses, and need some help to make it through a short term hardship or financial emergency, there are several organizations and charities in the [place] area that may be able to help. They range from the [employer], [employer], Landscape architect of [employer] and the local community action agency, the [employer], [employer]. These groups may be able to provide you resources to help pay your utility bills, rent, and they even offer housing assistance.  Crisis assistance program Find help for paying your rent, electric bills, free food, and even funds to pay your mortgage. The [employer] [phone]) offers several services to local families, as funding allows. The Emergency Assistance Program (EAP), which they administer, provides household goods, free food, clothing, and financial aid to people in need in the [facility] area. The EAP program does have some qualification, and counselors will interview clients for financial assistance by written referral  only. Referrals need to be made by the Department of Social Services or by other EAP approved human services agencies or charities in the area.  Money for resources for emergency assistance are available for security deposits for rent, water, electric, and gas, past due rent, utility bills, past due mortgage payments, food, and clothing. The [employer] also operates a Programme researcher, broadcasting/film/video on the site. More [employer] Global.  Open Door Ministries of [employer], which can be reached at [phone], offers emergency assistance programs for those in need of help, such as food, rent assistance, a soup kitchen, shelter, and clothing. They are based in [facility] but provide a number of services to those that qualify for assistance. Continue with Open  Door Ministries programs.  [facility] Department of Social Services may be able to offer temporary financial assistance and cash grants for paying rent and utilities. Help may be provided for local county residents who may be experiencing personal crisis when other resources, including government programs, are not available. Call [phone]  [place]. [place] Society, which is based in [place], provides financial assistance of up to $50.00 to help pay for rent, utilities, cooling bills, rent, and prescription medications. The program also provides secondhand furniture to those in need. [phone]  Matt[employer] is a Geneticist, molecular. The organization can offer emergency assistance for paying rent, electric bills, utilities, food, household products and furniture. They offer extensive emergency and transitional housing for families, children and single women, and also run a Boy's and Dole Food. 301 Thrift [employer]ops, [employer], and other aid offered too. [address], [place], [place] [address], [phone]  Additional locations of the [employer] are in [place] and other nearby communities. When you have an emergency, need free food, money for basic needs, or just need assistance around Christmas, then the Path[employer] Stores may have the resources you need. Or they can refer you to nearby agencies. Learn more.  [place] Low Income Risk manager - This is offered for [facility] families. The federal government created CIT Group Program provides a one-time cash grant payment to help eligible low-income families pay their electric and heating bills. [address] [place], [place] [address], [phone]  Government and [employer] - The county administers several emergency and self-sufficiency programs. Residents of [place] can get help with energy bills and food, rent, and other  expenses. In addition, work with a Sports coach who may be able to help you find a job or improve your employment skills. More [place] public assistance.  [place] Emergency Assistance - A program offers emergency utility and rent funds for greater [employer] area residents. The program can also provide counseling and referrals to charities and government programs. Also provides food and a free meal program that serves lunch Mondays - Saturdays and dinner seven days per week to individuals in the community. [address] [place], [place] [address], [phone]  [employer] - Offers affordable apartment and housing communities across [place] and [place]. The low income and seniors can access public housing, rental assistance to qualified applicants, and apply for the section 8 rent subsidy program. Other programs include Chiropractor and Engineer, maintenance. [address], [place], [place] [address], dial [phone].  Basic needs such as clothing - Low income families can receive free items (school supplies, clothes, holiday  assistance, etc.) from clothing closets while more moderate income [facility] families can shop at [employer]. Locations across the area help the needy. Get information on [place] [facility] free clothing centers.  The [facility] provides transitional housing to veterans and the disabled. Clients will also access other services too, including life skills classes, case management, and assistance in finding permanent housing. [address], [place], [place] [address], call [phone]  [facility] Transitional Housing in [place] is for people who were just evicted or that are formerly homeless. The non-profit will also help then gain self-sufficiency, find a home or apartment to live in, and also provides information on rent assistance when needed.  Dial [phone]  [employer] Partnership to End Homelessness is available in [place]. Families that were evicted or that are homeless can gain shelter, food, clothing, furniture, and also emergency financial assistance. Other services include financial skills and life skills coaching, job training, and case management. [address] [place], [place] [address]. Telephone [phone].  The [employer]ne[employer], [employer]. runs the [employer]. This can help people save money on their heating and summer cooling bills, and is free to low income families. Free upgrades can be made to your home. Phone [phone]  Many of the non-profits and programs mentioned above are all inclusive, meaning they can meet many needs of the low income, such as energy bills, food, rent, and more. However there are several organizations that focus just on rent and housing. Read more on rent assistance in [place] region.  Legal assistance for evictions, foreclosure, and more If you need free legal advice on civili issues, such as foreclosures, evictions, Electronics engineer, government programs, domestic issues and more, Armed forces operational officer Aid of [facility]) is a Associate Professor firm that provides free legal services and counsel to lower income people, seniors, disabled, and others. The goal is to ensure everyone has access to justice and fair representation.  Call them at [phone], or click here to learn more about [place] free legal assistance programs.  [place] [employer]net and funds for emergency expenses The [employer] Stores is another organization that can provide people with De[employer] & Company and funds to pay bills. Their assistance depends on funding, and the demand for help is always very high. They can provide cash to help pay rent, a missed mortgage payment, or gas, electric, and water bills. But the assistance doesn't stop there. They also have a food pantry on site, which can  provide food once every three (3) months to people who need help. The [employer] can also offer a Engineering geologist once every three (3) months for a maximum three (3) times. After receiving this voucher over that period of time, applicants can receive this aid one every six (6) months after that. [phone].  Kohl's action agency The Intel, [employer]. offers job and Dispensing optician. Resources are focused on helping students obtain the skills and experiences that are necessary to compete in today's challenging and tight job market. The non-profit faith-based community action agency offers internship trainings as well as classroom instruction. Economically disadvantaged and challenged individuals and potential employers can use their services. Classes are tailored to meet the needs of people in the [facility] region. [place], [place] [address], [phone]    Foreclosure prevention services Housing Counseling and Education is also offered by [employer] of the [place]. The agency (phone number is below) is a Higher education careers adviser  certified housing counseling agency providing foreclosure advice and counseling. They offer mortgage resolution counseling and also reverse mortgage counseling. Counselors can direct people to both Kimberly[employer]Clark, as well as Weyerhaeuser Company foreclosure assistance options.  Warehouse manager has locations in [place] and [place]. They run debt and foreclosure prevention programs for local families. A sampling of the programs offered include both Budget and Housing Counseling. This includes money management, financial advice, budget review and development of a written action plan with a Pensions consultant to help solve specific individual financial problems. In addition, housing and mortgage counselors can also provide pre- and post-purchase  homeownership counseling, default resolution counseling (to prevent foreclosure) and reverse mortgage counseling. A Debt Management Program allows people and families with a high level of credit card or medical debt to consolidate and repay consumer debt and loans to creditors and rebuild positive credit ratings and scores. [phone]  Debt assistance programs Receive free counseling and debt help from [facility] of the [place]. The [facility] based agency can be reached at [phone]. The counselors provide free help, and the services include budget counseling. This will help people manage their expenses and set goals. They also offer a Forensic scientist, which will help individuals consolidate their debts and become debt free. Most of the workshops and services are free.  Community clinics in [place] of the leading health and dental centers are listed below. They may be able to provide medication, physicals, dental care, and general family care to residents of all incomes and backgrounds across the region. Some of the programs focus on the low income and underinsured. However if these clinics can't meet your needs, find information and details on more clinics in [facility].  Some of the options include [employer] of [employer]. This center provides free or low cost health care to low-income adults 18 - 64, who have no health insurance. Among other services offered include a pharmacy and eye clinic. Phone [phone]  [facility], which is located in [place], is a community clinic that provides primary medical and health care to uninsured and underinsured adults and families, as well as the low income, in the greater [place] area on a sliding-fee scale. Call [phone]  [place] Adult Dental Program - They run a dental assistance program that is organized by [facility] to provide dental services  and aid to [employer]. Services offered by the dental clinic are limited to extractions, pain management, and minor restorative care. [phone]  [place] [facility] has locations in [facility] and [place]. The community clinics provide complete pediatric care including primary health, mental health, social work, neurology, cardiology, asthma. Dial [phone].  In addition to those [address] and [employer], find other free community clinics in [place] and across the county.  Food pantry and assistance Some of the local food pantries and distribution centers to call for free food and groceries include The Hive of [place] (phone [phone]), The [facility], The (phone [phone]) and also [employer] Corporation. Dial [phone].  Several other food banks in the region provide clothing, free food and meals, access to soup kitchens and other help. Find the addresses and phone numbers of more food pantries in [place]. http://www.needhelppayingbills.com/html/[place]_assistance_pro.html  Sunday, by [address] [place] [address] [place], [address], [phone], 3.2 mi from  [facility], call in advance for appointment at 10:00am or at 4:00pm, must provide valid photo ID  Monday  9:30am-5:00pm [facility], [address] [place] [phone], [phone], 0.9 mi from [facility], can come four times per year, bring your photo ID and SS cards for other residents of household, will make appointments for those who work and need to come after 5pm  10:00am-12:00noon St [employer], [address]  [place], [address], [phone], 1.7 mi from [facility], can come once every 60 days per household, need referral from DSS, Liberty Global, etc., bring photo ID and SS card   10:00am-1:00pm [employer] the [facility],  [address]  [place], [address], [phone], 7.7 mi from [facility] [place], can come once every thirty days with a referral from DSS, [employer], Mental Health, etc. -- each referral good for six visits, bring photo ID   10:00am-1:00pm [address], [address], [address], [phone], 4.2 mi from [facility], can come once every 6 months, open to [facility] residents, bring photo ID and copy of a current utility bill in your name, please call first to verify that food is available  6:30pm-8:30pm PDY&F Food Pantry, [address], [address], [phone], 3.2 mi from [facility], can come once every 30 days, maximum 6 times per year, bring your photo ID and SS numbers for other residents of household  Monday by APPOINTMENT ONLY  Bread [employer] Life Food Pantry, [address] [place], [address],  [phone], 2.5 mi from [facility], call in advance for appointment between 10:00am-2:00pm, bring your photo ID and SS cards for all residents of household, can come once every 3 months  One Step Further, [address], [address], [phone], 0.7 mi from [facility], call in advance for appointment, can come once every 30 days, bring your photo ID and SS cards for other residents of household   Tuesday  9:00am-12:00noon [employer], [address], [address], [phone], 1.3 mi from [facility], can come once every 3 months, bring your photo ID and SS numbers for other residents of household   9:00am-1:00pm [facility] [address],  [place], [address], [phone], 1.6 mi from [facility], can come once every two weeks  9:30am-5:00pm [employer], [address] [place] [phone], [phone], 0.9 mi from [facility], can come four times per year, bring your photo ID and SS cards for other residents of household, will make appointments for those who work and need to come after 5pm  10:00am-12:00noon [employer], [address]  [place], [address], [phone], 1.7 mi from [facility], can come once every 60 days per household, need referral from DSS, [employer], etc., bring photo ID and SS card  10:00am-1:00pm [employer], [address], [id number],  [phone], 3.8 mi from [facility], with referral from DSS, may come six times, 30 days apart, bring your photo ID and SS cards for all residents of household   10:00am-1:00pm [address] the [facility], [address] [place], [address], [phone], 7.7 mi from [facility], can come once every thirty days with a referral from DSS, [employer] Stores, Mental Health, etc.- each referral good for six visits, bring photo ID   10:00am-1:00pm [facility]  [address], [address] [address], [phone], 4.2 mi from [facility], can come once every 6 months, open to [facility] residents, bring photo ID and copy of a current utility bill in your name, please call first to verify that food is available  2:00pm-3:30pm [facility], [address], [phone], [phone], 3.7 mi from [facility], can come twelve times per year, one bag per family, bring photo ID   FIRST AND THIRD Tuesdays  10:00am-1:00pm, [address] [place], [address], [phone], 7.2 mi from [facility], can come once every 30 days   Tuesday, WHEN FOOD IS AVAILABLE (call)  12:00noon-2:00pm [facility], [address], [address], [phone], 1.5 mi from  [facility], can come once every 30 days, bring photo ID   Tuesday, by APPOINTMENT ONLY  [employer] [facility], [address] [place], [address],  [phone], 2.5 mi from [facility], call in advance for appointment between 1:00pm-4:00pm, bring your photo ID and SS cards for all residents of household, can come once every 3 months   [address]  of [place], [address] [address], [phone], 5 mi from [facility], call one day ahead for appointment the next day between 10:00am and 12:00noon, can come once every 3 months, bring your photo ID and must qualify according to family income   One [employer] Further, [address], [address], [phone], 0.7 mi from [facility], call in advance for appointment, can come once every 30 days, bring your photo ID and SS cards for other residents of household   [address] of [place], [place], [address],  [phone], 5.1 mi from [facility], call between 9:00am and 1:00pm M-F to make appointment. Appointments are scheduled for Tues and Thurs from 10:00am-11:30am, bring photo ID, can come once every 6 months, limit three visits over 18 months, then must have referral  Wednesday  9:30am-5:00pm [facility], [address] [place] [id number], [phone], 0.9 mi from [facility], can come four times per year, bring your photo ID and SS cards for other residents of household, will make appointments for those who work and need to come after 5pm  9:30am-11:30am [employer] of [employer] [facility], [address], [address], [phone], 6.6 mi from [facility], can come once every 30 days, bring your photo ID, and SS cards for other residents of household, each monthly visit requires a written referral from GUM or DSS with number in household on form  10:00am-12:00noon [address]  [place], [address], [phone], 1.7 mi from [facility], can come once every 60 days per household, need referral from DSS, [employer], etc., bring photo ID and SS card  10:00am-1:00pm [employer] [facility], [id number],  [phone], 3.9 mi from [facility], with referral from DSS, may come six times, 30 days apart, bring your photo ID and SS cards for all residents of household   10:00am-1:00pm [address] the [facility], [address] [place], [address], [phone], 7.7 mi from [facility], can come once every thirty days with a referral from DSS, [employer], Mental Health, etc. - each referral good for six visits, bring photo ID   2:00pm-5:45pm [employer],  [address], [address], [phone], 3.2 mi from [facility], once every 30 days, first come/first served, limited to first 25, bring photo ID   6:30pm-8:30pm PDY&F Food Pantry, [address], [address], [phone], 3.2 mi from [facility], can come once every 30 days, maximum 6 times per year, bring your photo ID and SS numbers for other residents of household  FIRST and THIRD Wednesdays   9:00am-12:00noon, [address], [address] [place], [address], [phone], 4.2 mi from [facility], can come once a month. Please arrive and sign in no later than 11:15 so everyone can be served by 12 noon.  THIRD Wednesday  1:30pm-3:00pm, [address], [address] [place], [address], [phone] or [phone], 2.1 mi from [facility]  Wednesday, by APPOINTMENT ONLY  [address] [facility] [place], [address] [address], [phone], 5 mi from [facility], call one day ahead for appointment the next day between 10:00am and 12:00noon, can come once every 3 months, bring your photo ID and must qualify according to family income   [facility], [address], [address], [phone], 0.7 mi from [facility], call in advance for appointment, can come once every 30 days, bring your photo ID and SS cards for other residents of household   [address] of [place], [address] [place], [address], [phone], 3.2 mi from [facility], call in advance for appointment at 7:00pm, must provide valid photo ID  Thursday  9:00am-12:00noon [employer], [address] [address], [phone], 1.3 mi from [facility], can come once every 3 months,  bring your photo ID and SS numbers for other residents of household   9:00am-1:00pm [facility] [address],  [place], [address], [phone], 1.6 mi from [facility], can come once every two weeks  9:30am-12:00noon [employer], [address] [address], [phone], 4.5 mi from [facility], can come once every 30 days, must state income [closed Thanksgiving and week of Christmas]  9:30am-5:00pm [employer], [address] [place] [phone], [phone], 0.9 mi from [facility], can come four times per year, bring your photo ID and SS card for other residents of household, will make appointments for those who work and need to come after 5pm  10:00am-1:00pm [employer], [address] [place], [id number],  [phone], 3.9 mi from [facility], with referral from DSS, may come six times, 30 days apart, bring your photo ID and SS cards for all residents of household   10:00am-1:00pm [address] the [facility], [address] [place], [address], [phone], 7.7 mi from [facility], can come once every thirty days with a referral from DSS, [employer], Mental Health, etc.- each referral good for six visits, bring photo ID   10:00am-1:00pm [facility], [address] [address], [phone], 4.2 mi from [facility], can come once every 6 months, open to [facility] residents, bring photo ID and copy of a current utility bill in your name, please call first to verify that food is available   SUNDAYS BREAKFAST TWO LOCATIONS: 8:00am served in [place] [facility] by Awaken [employer] 8:30am SHUTTLE provided from [facility], served at [employer], [address] LUNCH TWO LOCATIONS [plus one additional third Sunday only] 10:30am -  12:30pm served at [employer], [employer], [place] [address] (1.2 miles from [facility]) 12:30pm served  in [place] by [employer] Team (THIRD Sunday only) 1:30pm served at [facility] by [facility] one location [plus one additional third Sunday only] 5:00pm Every Sunday, served under the bridge at [address] by Lindell Noe Under the [employer]M [employer] (.7 miles from [facility]) (THIRD Sunday ONLY) 4:00pm served in the parking garage, across from [employer], corner of [place] and [place] by Ryland [employer]ries MONDAYS BREAKFAST 7:30am served in [employer] by the [employer] and Friends LUNCH 10:30am - 12:30pm served at [employer], [employer], [place] [address] (1.2 miles from [facility] DINNER TWO LOCATIONS: 7:00pm served in front of the courthouse at the corner of [employer] and [employer]. by [facility] Monday Night Meal (3 blocks from [facility]) 4:30pm served at the [employer], [address] by The Pro[employer] Not Bombs (0.6 miles from [facility]) [place] BREAKFAST 8:00am - 9:00am served at The [employer], [address] (0.3 miles from [place]) LUNCH 10:30am - 12:30pm served at the [employer], [employer] [address] (1.2 miles from [place]) DINNER 6:00pm served at [employer], enter from Capital One and go to the [employer], (0.7 miles from [place]) [facility] BREAKFAST 7:00am - 8:00am served at [employer], [employer] [address] (1.2 miles from [place]) LUNCH ONE LOCATION [plus two additional locations listed below] 10:30am - 12:30pm served at [employer], [employer] [address] (1.2 miles from [place]) (FIRST Wednesday ONLY) 11:30am served at [employer], [place] [address] (6.6 miles from [place]) (SECOND Wednesday ONLY) 11:00am served at [place]. Melvyn Novas of [address],  [address] (1.3 miles from [place]) [place] TWO LOCATIONS 6:00pm served at W. [employer]. [employer]kley, [place] [address] V[address]. (1.3 miles from [facility]) 4:00pm - 6:00pm (hot dogs and chips) served at Levi Strauss of [facility], [address] (1.7 miles from [place]) [place] BREAKFAST NOT AVAILABLE AT THIS TIME LUNCH 10:30am - 12:30pm served at [employer], [employer], [place] [address] (1.2 miles from [place]) DINNER 6:00pm served at [employer], enter from Capital One and go to the [employer], (0.7 miles from [employer]) [place] BREAKFAST NOT AVAILABLE AT THIS TIME LUNCH 10:30am - 12:30pm served at [employer], [employer] [address] [place] [address], (1.2 miles from [place]) DINNER TWO LOCATIONS, [plus one additional first Friday only] 6:00pm served under the bridge at [address] by Lindell Noe Under [employer]. (.7 miles from [facility]) 5:00pm - 7:00pm served at Levi Strauss of [facility], [address] (1.7 miles from [place]) (FIRST Friday ONLY) 5:45 pm - SHUTTLE provided from the LIBRARY at 5:45pm. Served at [facility], [address] SATURDAYS BREAKFAST TWO LOCATIONS [plus one additional last Saturday only] 8:00am served at [facility] by [employer] 8:30am served at [employer], [address]. (2.2 miles from [facility]) (LAST Saturday ONLY) 8:30am served at [employer], [address] (5 miles from [place]) LUNCH 10:30am -  12:30pm served at [employer], [employer] [address] (1.2 miles from [facility]) DINNER 6:00pm served under the bridge at [address] by World [employer] Services Corporation (0.7 miles from [employer])  DIRECTIONS FROM [place] TO ALL MEAL LOCATIONS The  Bridge at [address] (.7 miles from [address]) [address] on [place]. Turn Right onto DIRECTV 433 ft. Continue onto [address] under bridge, about 500 ft. Courthouse (3 blocks from [facility]) [place] on [address]. Turn right on [place] 1 block to PPL Corporation (.5 miles from [place]) [place] on [place]. YRC Worldwide. past Brink's Company to EMCOR. Enter from Capital One and go to the Affiliated Computer Services building W. R. Berkley [address] V[address]. (1.3 miles from [facility]) [address] on [place]. Turn Right onto [address]. church will be on the Left. The [employer] Companies [address] (.3 miles from [facility]) Go .3 miles on [address] Destination is on your right [employer] at [employer] (6.6 miles from [address]) [address] on [place] toward W Friendly Turn right onto W Friendly Continue onto Alcoa Inc. Continue onto Toll Brothers. 5. Elesa Hacker is on right [facility] Conseco) [address] (.6 miles from [address]) [place] on [place] Turn Left onto [address] 0.3 miles Destination is on the Left. [place] [employer] Decker at [employer] Express (5 miles from [employer]) 1. Head south on [address]. Turn right onto [address] Friend[address] Turn slightly left onto Quest [employer]ostics Continue onto [employer]ostics Turn right at [employer] Noble Continue to church on right [place] Sounds of [facility] [address] (1.7 miles from [place]) [address] on [place] 1.4 miles [place] becomes [place]. [address] Continue 0.6 miles and church will be on theright. [place] [employer] Life Insurance at [address] (2.5 miles from [employer]) [place] provided from [employer] Mutual Life Park] [place] on [place]. Elm toward Estée Lauder right onto Costco Wholesale left onto Henry Schein left onto  Micron Technology [address] Southern Company (2.2 miles from [address]) [address] on [place] 1.4 miles [place] becomes [place]. [place]m [address] Turn right onto [address] and church will be on the Left. [place] AT[employer]T [address] (1.2 miles from [facility]) 1.Turn right onto [facility] 2.Turn left onto Rogue Jury 3.Reino Kent 4.Destination is on your right [place]. Melvyn Novas of [address] at [employer]us (1.3 miles from [facility]) [address] on [address] Turn left onto Genuine Parts right onto [address]. [address]. Continue onto KB Home	[place]. Turn left onto [employer]ur[employer]Stone [employer]. Turn right onto WellPoint.   Follow up with [employer] to see if you have Transportation benefits.

## 2024-01-18 NOTE — Progress Notes (Signed)
   01/18/24 1108  SDOH Interventions  Food Insecurity Interventions Inpatient TOC;[facility] Provided;Other (Comment)   CSW met with pt at bedside to complete SDOH assessment and discuss consult for SU education. Pt states he has stable housing but struggles to pay for food and utilities. Pt states transportation is his biggest issue, as he cannot go anywhere to get food or a job. Pt states he "gets really bored" at home and that is why he uses Crystal Meth. Last use was last week, uses a couple times a week. Pt states he has gotten treatment before and it was helpful. Pt agreeable to resources being added to AVS.

## 2024-01-18 NOTE — Consult Note (Signed)
Reason for Consult:Left FA FB Referring Physician: Hughie Closs Time called: [id number] Time at bedside: 1036   Kenneth Savoy Baron Parmelee. is an 38 y.o. male.  HPI: Kenneth Welch came to the ED and was admitted with multiple sores of both arms for a few days. He says he got them 2/2 IVDU. He had 2 on the left arm I&D'd in the ED. He states they are all feeling better at this point. X-rays showed a needle fragment in the left FA and hand surgery was consulted. He is RHD and not currently working.  Past Medical History:  Diagnosis Date  . Acute HIV infection (HCC) 04/16/2021  . Asthma   . Chlamydia 07/13/2021  . Gonorrhea 07/13/2021  . HIV (human immunodeficiency virus infection) (HCC)   . HIV disease (HCC) 02/04/2022  . Long term current use of antipsychotic medication 11/29/2023  . 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 Pain associated with defecation 07/13/2021  . Papules 07/13/2021  . Rectal bleeding 04/16/2021  . Schizophrenia (HCC)     History reviewed. No pertinent surgical history.  History reviewed. No pertinent family history.  Social History:  reports that he has never smoked. He has never used smokeless tobacco. He reports current drug use. Drug: Marijuana. He reports that he does not drink alcohol.  Allergies: No Known Allergies  Medications: I have reviewed the patient's current medications.  Results for orders placed or performed during the hospital encounter of 01/16/24 (from the past 48 hours)  Culture, blood (Routine x 2)     Status: None (Preliminary result)   Collection Time: 01/16/24 11:27 PM   Specimen: BLOOD RIGHT ARM  Result Value Ref Range   Specimen Description BLOOD RIGHT ARM    Special Requests      BOTTLES DRAWN AEROBIC AND ANAEROBIC Blood Culture adequate volume   Culture      NO GROWTH 1 DAY Performed at [facility], [address] [place], [place] [address]    Report Status PENDING   Comprehensive  metabolic panel     Status: None   Collection Time: 01/16/24 11:31 PM  Result Value Ref Range   Sodium 136 135 - 145 mmol/L   Potassium 4.2 3.5 - 5.1 mmol/L   Chloride 101 98 - 111 mmol/L   CO2 25 22 - 32 mmol/L   Glucose, Bld 86 70 - 99 mg/dL    Comment: Glucose reference range applies only to samples taken after fasting for at least 8 hours.   BUN 11 6 - 20 mg/dL   Creatinine, Ser 7.82 0.61 - 1.24 mg/dL   Calcium 9.0 8.9 - 95.6 mg/dL   Total Protein 7.6 6.5 - 8.1 g/dL   Albumin 3.7 3.5 - 5.0 g/dL   AST 20 15 - 41 U/L   ALT 18 0 - 44 U/L   Alkaline Phosphatase 51 38 - 126 U/L   Total Bilirubin 0.4 0.0 - 1.2 mg/dL   GFR, Estimated >21 >30 mL/min    Comment: (NOTE) Calculated using the CKD-EPI Creatinine Equation (2021)    Anion gap 10 5 - 15    Comment: Performed at [facility], [address], [place], [place] [address]  CBC with Differential     Status: None   Collection Time: 01/16/24 11:31 PM  Result Value Ref Range   WBC 9.0 4.0 - 10.5 K/uL   RBC 4.42 4.22 - 5.81 MIL/uL   Hemoglobin 13.7 13.0 - 17.0 g/dL  HCT 41.4 39.0 - 52.0 %   MCV 93.7 80.0 - 100.0 fL   MCH 31.0 26.0 - 34.0 pg   MCHC 33.1 30.0 - 36.0 g/dL   RDW 78.2 95.6 - 21.3 %   Platelets 302 150 - 400 K/uL   nRBC 0.0 0.0 - 0.2 %   Neutrophils Relative % 59 %   Neutro Abs 5.2 1.7 - 7.7 K/uL   Lymphocytes Relative 31 %   Lymphs Abs 2.8 0.7 - 4.0 K/uL   Monocytes Relative 6 %   Monocytes Absolute 0.5 0.1 - 1.0 K/uL   Eosinophils Relative 4 %   Eosinophils Absolute 0.3 0.0 - 0.5 K/uL   Basophils Relative 0 %   Basophils Absolute 0.0 0.0 - 0.1 K/uL   Immature Granulocytes 0 %   Abs Immature Granulocytes 0.04 0.00 - 0.07 K/uL    Comment: Performed at [facility], [address], [place], [place] [address]  Protime-INR     Status: None   Collection Time: 01/16/24 11:31 PM  Result Value Ref Range   Prothrombin Time 13.4 11.4 - 15.2 seconds   INR 1.0 0.8 - 1.2    Comment: (NOTE) INR goal  varies based on device and disease states. Performed at [facility], [address], [place], [place] [address]   Culture, blood (Routine x 2)     Status: Abnormal (Preliminary result)   Collection Time: 01/16/24 11:31 PM   Specimen: BLOOD RIGHT HAND  Result Value Ref Range   Specimen Description BLOOD RIGHT HAND    Special Requests      BOTTLES DRAWN AEROBIC AND ANAEROBIC Blood Culture adequate volume   Culture  Setup Time      GRAM POSITIVE COCCI IN CLUSTERS ANAEROBIC BOTTLE ONLY CRITICAL RESULT CALLED TO, READ BACK BY AND VERIFIED WITH: PHARMD K. HURTH [id number] @ 2121 FH    Culture (A)     STAPHYLOCOCCUS EPIDERMIDIS THE SIGNIFICANCE OF ISOLATING THIS ORGANISM FROM A SINGLE SET OF BLOOD CULTURES WHEN MULTIPLE SETS ARE DRAWN IS UNCERTAIN. PLEASE NOTIFY THE MICROBIOLOGY DEPARTMENT WITHIN ONE WEEK IF SPECIATION AND SENSITIVITIES ARE REQUIRED. Performed at [facility], [address], [place], [place] [address]    Report Status PENDING   Blood Culture ID Panel (Reflexed)     Status: Abnormal   Collection Time: 01/16/24 11:31 PM  Result Value Ref Range   Enterococcus faecalis NOT DETECTED NOT DETECTED   Enterococcus Faecium NOT DETECTED NOT DETECTED   Listeria monocytogenes NOT DETECTED NOT DETECTED   Staphylococcus species DETECTED (A) NOT DETECTED    Comment: CRITICAL RESULT CALLED TO, READ BACK BY AND VERIFIED WITH: PHARMD K. HURTH [id number] @2121  FH    Staphylococcus aureus (BCID) NOT DETECTED NOT DETECTED   Staphylococcus epidermidis DETECTED (A) NOT DETECTED    Comment: CRITICAL RESULT CALLED TO, READ BACK BY AND VERIFIED WITH: PHARMD K. HURTH [id number] @2121  FH    Staphylococcus lugdunensis NOT DETECTED NOT DETECTED   Streptococcus species NOT DETECTED NOT DETECTED   Streptococcus agalactiae NOT DETECTED NOT DETECTED   Streptococcus pneumoniae NOT DETECTED NOT DETECTED   Streptococcus pyogenes NOT DETECTED NOT DETECTED   A.calcoaceticus-baumannii NOT DETECTED NOT  DETECTED   Bacteroides fragilis NOT DETECTED NOT DETECTED   Enterobacterales NOT DETECTED NOT DETECTED   Enterobacter cloacae complex NOT DETECTED NOT DETECTED   Escherichia coli NOT DETECTED NOT DETECTED   Klebsiella aerogenes NOT DETECTED NOT DETECTED   Klebsiella oxytoca NOT DETECTED NOT DETECTED  Klebsiella pneumoniae NOT DETECTED NOT DETECTED   Proteus species NOT DETECTED NOT DETECTED   Salmonella species NOT DETECTED NOT DETECTED   Serratia marcescens NOT DETECTED NOT DETECTED   Haemophilus influenzae NOT DETECTED NOT DETECTED   Neisseria meningitidis NOT DETECTED NOT DETECTED   Pseudomonas aeruginosa NOT DETECTED NOT DETECTED   Stenotrophomonas maltophilia NOT DETECTED NOT DETECTED   Candida albicans NOT DETECTED NOT DETECTED   Candida auris NOT DETECTED NOT DETECTED   Candida glabrata NOT DETECTED NOT DETECTED   Candida krusei NOT DETECTED NOT DETECTED   Candida parapsilosis NOT DETECTED NOT DETECTED   Candida tropicalis NOT DETECTED NOT DETECTED   Cryptococcus neoformans/gattii NOT DETECTED NOT DETECTED   Methicillin resistance mecA/C NOT DETECTED NOT DETECTED    Comment: Performed at [facility], [address] [place], [place] [address]  I-Stat Lactic Acid, ED     Status: None   Collection Time: 01/16/24 11:37 PM  Result Value Ref Range   Lactic Acid, Venous 1.2 0.5 - 1.9 mmol/L  T-helper cells (CD4) count (not at [facility])     Status: None   Collection Time: 01/17/24 12:42 AM  Result Value Ref Range   CD4 T Cell Abs 919 400 - 1,790 /uL   CD4 % Helper T Cell 36 33 - 65 %    Comment: Performed at [facility], [address] [place], [place] [address]  CBC     Status: Abnormal   Collection Time: 01/17/24  5:52 AM  Result Value Ref Range   WBC 7.9 4.0 - 10.5 K/uL   RBC 3.98 (L) 4.22 - 5.81 MIL/uL   Hemoglobin 12.4 (L) 13.0 - 17.0 g/dL   HCT 09.8 (L) 11.9 - 14.7 %   MCV 94.7 80.0 - 100.0 fL   MCH 31.2 26.0 - 34.0 pg   MCHC 32.9 30.0 - 36.0  g/dL   RDW 82.9 56.2 - 13.0 %   Platelets 264 150 - 400 K/uL   nRBC 0.0 0.0 - 0.2 %    Comment: Performed at [facility], [address] [place], [place] [address]  Creatinine, serum     Status: None   Collection Time: 01/17/24  5:52 AM  Result Value Ref Range   Creatinine, Ser 1.22 0.61 - 1.24 mg/dL   GFR, Estimated >46 >96 mL/min    Comment: (NOTE) Calculated using the CKD-EPI Creatinine Equation (2021) Performed at [facility], [address], [place], [place] [address]   Cd4/cd8 (t-helper/t-suppressor cell)     Status: None   Collection Time: 01/18/24 12:11 AM  Result Value Ref Range   Total lymphocyte count 2,126 1,000 - 4,000 /uL   CD4% 39.16 33 - 65 %   CD4 absolute 833 400 - 1,790 /uL   CD8tox 34.88 12 - 40 %   CD8 T Cell Abs 742 190 - 1,000 /uL   Ratio 1.12 1.0 - 3.0    Comment: Performed at [facility], [address] [place], [place] [address]  CBC with Differential/Platelet     Status: None   Collection Time: 01/18/24  6:05 AM  Result Value Ref Range   WBC 4.7 4.0 - 10.5 K/uL   RBC 4.45 4.22 - 5.81 MIL/uL   Hemoglobin 13.7 13.0 - 17.0 g/dL   HCT 40.1 02.7 - 25.3 %   MCV 92.1 80.0 - 100.0 fL   MCH 30.8 26.0 - 34.0 pg   MCHC 33.4 30.0 - 36.0 g/dL  RDW 13.6 11.5 - 15.5 %   Platelets 272 150 - 400 K/uL   nRBC 0.0 0.0 - 0.2 %   Neutrophils Relative % 43 %   Neutro Abs 2.0 1.7 - 7.7 K/uL   Lymphocytes Relative 43 %   Lymphs Abs 2.0 0.7 - 4.0 K/uL   Monocytes Relative 6 %   Monocytes Absolute 0.3 0.1 - 1.0 K/uL   Eosinophils Relative 7 %   Eosinophils Absolute 0.3 0.0 - 0.5 K/uL   Basophils Relative 1 %   Basophils Absolute 0.0 0.0 - 0.1 K/uL   Immature Granulocytes 0 %   Abs Immature Granulocytes 0.00 0.00 - 0.07 K/uL    Comment: Performed at [facility], [address] [place], [place] [address]  Basic metabolic panel     Status: None   Collection Time: 01/18/24  6:05 AM  Result Value Ref Range   Sodium 138 135  - 145 mmol/L   Potassium 4.1 3.5 - 5.1 mmol/L   Chloride 101 98 - 111 mmol/L   CO2 25 22 - 32 mmol/L   Glucose, Bld 89 70 - 99 mg/dL    Comment: Glucose reference range applies only to samples taken after fasting for at least 8 hours.   BUN 13 6 - 20 mg/dL   Creatinine, Ser 6.04 0.61 - 1.24 mg/dL   Calcium 9.3 8.9 - 54.0 mg/dL   GFR, Estimated >98 >11 mL/min    Comment: (NOTE) Calculated using the CKD-EPI Creatinine Equation (2021)    Anion gap 12 5 - 15    Comment: Performed at [facility], [address] [place], [place] [address]    DG Forearm Right Addendum Date: 01/17/2024 ADDENDUM REPORT: 01/17/2024 01:00 ADDENDUM: Addendum to correct dictation error. Impression #3 should state "NO acute fracture or evidence of osteomyelitis in the right forearm." This change was discussed by telephone on 01/17/2024 at 1:00 am to provider BROOKE SMALL , who verbally acknowledged these results. Electronically Signed   By: Minerva Fester M.D.   On: 01/17/2024 01:00   Result Date: 01/17/2024 CLINICAL DATA:  Encounter for abscess and IV drug use. Multiple infected sores on the bilateral lower arms. EXAM: RIGHT FOREARM - 2 VIEW; LEFT FOREARM - 2 VIEW COMPARISON:  None Available. FINDINGS: Left: Soft tissue swelling about the left forearm. Multiple focal areas of soft tissue swelling containing locules of gas compatible with abscesses. Thin 8 mm linear metallic density likely a foreign body in the ulnar/volar proximal forearm. No acute fracture. No radiographic evidence of osteomyelitis. Right: No acute fracture or dislocation. No radiographic evidence of osteomyelitis. Mild soft tissue swelling. IMPRESSION: 1. Multiple areas of focal swelling with locules of gas suspicious for abscesses in the left forearm. No evidence of osteomyelitis. 2. 8 mm thin metallic density in the proximal left forearm suspicious for needle fragment. 3. Acute fracture evidence of osteomyelitis in the right forearm.  Electronically Signed: By: Minerva Fester M.D. On: 01/17/2024 00:24   DG Forearm Left Addendum Date: 01/17/2024 ADDENDUM REPORT: 01/17/2024 01:00 ADDENDUM: Addendum to correct dictation error. Impression #3 should state "NO acute fracture or evidence of osteomyelitis in the right forearm." This change was discussed by telephone on 01/17/2024 at 1:00 am to provider BROOKE SMALL , who verbally acknowledged these results. Electronically Signed   By: Minerva Fester M.D.   On: 01/17/2024 01:00   Result Date: 01/17/2024 CLINICAL DATA:  Encounter for abscess and IV drug use. Multiple infected sores on the bilateral  lower arms. EXAM: RIGHT FOREARM - 2 VIEW; LEFT FOREARM - 2 VIEW COMPARISON:  None Available. FINDINGS: Left: Soft tissue swelling about the left forearm. Multiple focal areas of soft tissue swelling containing locules of gas compatible with abscesses. Thin 8 mm linear metallic density likely a foreign body in the ulnar/volar proximal forearm. No acute fracture. No radiographic evidence of osteomyelitis. Right: No acute fracture or dislocation. No radiographic evidence of osteomyelitis. Mild soft tissue swelling. IMPRESSION: 1. Multiple areas of focal swelling with locules of gas suspicious for abscesses in the left forearm. No evidence of osteomyelitis. 2. 8 mm thin metallic density in the proximal left forearm suspicious for needle fragment. 3. Acute fracture evidence of osteomyelitis in the right forearm. Electronically Signed: By: Minerva Fester M.D. On: 01/17/2024 00:24    Review of Systems  Constitutional:  Negative for chills, diaphoresis and fever.  HENT:  Negative for ear discharge, ear pain, hearing loss and tinnitus.   Eyes:  Negative for photophobia and pain.  Respiratory:  Negative for cough and shortness of breath.   Cardiovascular:  Negative for chest pain.  Gastrointestinal:  Negative for abdominal pain, nausea and vomiting.  Genitourinary:  Negative for dysuria, flank pain,  frequency and urgency.  Musculoskeletal:  Positive for arthralgias (Left FA). Negative for back pain, myalgias and neck pain.  Neurological:  Negative for dizziness and headaches.  Hematological:  Does not bruise/bleed easily.  Psychiatric/Behavioral:  The patient is not nervous/anxious.    Blood pressure 109/71, pulse 83, temperature 98.2 F (36.8 C), temperature source Oral, resp. rate 18, height 6\' 3"  (1.905 m), weight 79.4 kg, SpO2 100%. Physical Exam Constitutional:      General: He is not in acute distress.    Appearance: He is well-developed. He is not diaphoretic.  HENT:     Head: Normocephalic and atraumatic.  Eyes:     General: No scleral icterus.       Right eye: No discharge.        Left eye: No discharge.     Conjunctiva/sclera: Conjunctivae normal.  Cardiovascular:     Rate and Rhythm: Normal rate and regular rhythm.  Pulmonary:     Effort: Pulmonary effort is normal. No respiratory distress.  Musculoskeletal:     Cervical back: Normal range of motion.     Comments: Bilateral shoulder, elbow, wrist, digits- Multiple ulcerations volar FA's, mild TTP left distal FA, no fluctuance, no instability, no blocks to motion  Sens  Ax/R/M/U intact  Mot   Ax/ R/ PIN/ M/ AIN/ U intact  Rad 2+  Skin:    General: Skin is warm and dry.  Neurological:     Mental Status: He is alert.  Psychiatric:        Mood and Affect: Mood normal.        Behavior: Behavior normal.    Assessment/Plan: Left FA FB -- Would not advocate removal as it is not bothering him. Do not think it is related to his current wounds. Does not need hand surgery f/u at this time.    Freeman Caldron, PA-C Orthopedic Surgery [phone] 01/18/2024, 10:45 AM

## 2024-01-18 NOTE — Plan of Care (Addendum)
2 open punctures sites on L arm. Scant serosanguinous drainage  Covered with dry gauze  Patient denies pain BUE   Problem: Education: Goal: Knowledge of General Education information will improve Description: Including pain rating scale, medication(s)/side effects and non-pharmacologic comfort measures Outcome: Progressing   Problem: Health Behavior/Discharge Planning: Goal: Ability to manage health-related needs will improve Outcome: Progressing   Problem: Clinical Measurements: Goal: Ability to maintain clinical measurements within normal limits will improve Outcome: Progressing Goal: Will remain free from infection Outcome: Progressing Goal: Diagnostic test results will improve Outcome: Progressing Goal: Respiratory complications will improve Outcome: Progressing Goal: Cardiovascular complication will be avoided Outcome: Progressing

## 2024-01-19 ENCOUNTER — Other Ambulatory Visit (HOSPITAL_COMMUNITY): Payer: Self-pay

## 2024-01-19 DIAGNOSIS — L03119 Cellulitis of unspecified part of limb: Secondary | ICD-10-CM | POA: Diagnosis not present

## 2024-01-19 DIAGNOSIS — L02419 Cutaneous abscess of limb, unspecified: Secondary | ICD-10-CM | POA: Insufficient documentation

## 2024-01-19 LAB — CULTURE, BLOOD (ROUTINE X 2): Special Requests: ADEQUATE

## 2024-01-19 MED ORDER — CLONIDINE HCL ER 0.1 MG PO TB12
0.1000 mg | ORAL_TABLET | Freq: Every morning | ORAL | 1 refills | Status: DC
  Filled 2024-01-19: qty 30, 30d supply, fill #0

## 2024-01-19 MED ORDER — SULFAMETHOXAZOLE-TRIMETHOPRIM 800-160 MG PO TABS
1.0000 | ORAL_TABLET | Freq: Two times a day (BID) | ORAL | 0 refills | Status: AC
  Filled 2024-01-19: qty 20, 10d supply, fill #0

## 2024-01-19 NOTE — Discharge Summary (Signed)
Physician Discharge Summary  Kenneth Welch. WGN:[id number] DOB: January 22, 1986 DOA: 01/16/2024  PCP: [facility]  Admit date: 01/16/2024 Discharge date: 01/19/2024 30 Day Unplanned Readmission Risk Score    Flowsheet Row ED to Hosp-Admission (Current) from 01/16/2024 in [facility]  SURGICAL  30 Day Unplanned Readmission Risk Score (%) 13.61 Filed at 01/19/2024 0801       This score is the patient's risk of an unplanned readmission within 30 days of being discharged (0 -100%). The score is based on dignosis, age, lab data, medications, orders, and past utilization.   Low:  0-14.9   Medium: 15-21.9   High: 22-29.9   Extreme: 30 and above          Admitted From: Home Disposition: Home  Recommendations for Outpatient Follow-up:  Follow up with PCP in 1-2 weeks Please obtain BMP/CBC in one week Please follow up with your PCP on the following pending results: Unresulted Labs (From admission, onward)     Start     Ordered   01/19/24 0911  T-helper cells (CD4) count (not at [facility])  Once,   R        01/19/24 0910   01/19/24 0911  HIV-1 RNA quant-no reflex-bld  Once,   R        01/19/24 0910              Home Health: None Equipment/Devices: None  Discharge Condition: Stable CODE STATUS: Full Diet recommendation: Cardiac  Subjective: Seen and examined.  He has no complaints.  Pain in arm is improving.  Brief/Interim Summary: This is a 38 year old gentleman with history of HIV, IV drug abuse, asthma and schizophrenia who presented to ED with wounds and pain in bilateral upper extremities, more so in the left upper extremity, admitted under hospitalist service after midnight and workup indicated left upper extremity cellulitis and several small subcutaneous abscesses. Patient was started on cefepime and vancomycin, Ortho consulted.   Cellulitis and multiple areas of abscesses in the left forearm, POA: This was confirmed on the x-ray, multiple  areas of gas suspicion for abscess, locules.  Also appears to have needle fragment in the left arm as well.  Evaluated by Ortho, they had no further recommendations other than continuing antibiotics.  Patient's arm clinically is improving.  He is afebrile with no leukocytosis.  Out of concern of deep infection/abscess and osteomyelitis, CT of the left upper extremity with contrast was obtained which showed only 1 small abscess which was superficial and no deep abscess or osteomyelitis.  After curbsiding with ID on-call, discharging on Bactrim DS for 10 days.  He has been counseled extensively to avoid IV drug use.     HIV: Continue home medications.   Mild intermittent asthma: Asymptomatic.  Continue as needed nebs.   Methamphetamine use disorder: Social work consulted to provide with resources.   Schizophrenia: Continue Abilify.  Discharge plan was discussed with patient and/or family member and they verbalized understanding and agreed with it.  Discharge Diagnoses:  Principal Problem:   Cellulitis Active Problems:   Arm abscess    Discharge Instructions   Allergies as of 01/19/2024   No Known Allergies      Medication List     TAKE these medications    ARIPiprazole ER 400 MG Srer injection Commonly known as: ABILIFY MAINTENA Inject 1.5 mLs (300 mg total) into the muscle every 28 (twenty-eight) days.   Biktarvy 50-200-25 MG Tabs tablet Generic drug: bictegravir-emtricitabine-tenofovir AF Take  1 tablet by mouth daily.   cloNIDine HCl 0.1 MG Tb12 ER tablet Commonly known as: KAPVAY Take 1 tablet (0.1 mg total) by mouth in the morning.   sulfamethoxazole-trimethoprim 800-160 MG tablet Commonly known as: Bactrim DS Take 1 tablet by mouth 2 (two) times daily for 10 days.   traZODone 50 MG tablet Commonly known as: DESYREL Take 0.5 tablets (25 mg total) by mouth at bedtime as needed and may repeat dose one time if needed for sleep.        Follow-up Information      [facility] Follow up in 1 week(s).   Specialty: Internal Medicine Contact information: [address] [place] [address] [phone]                No Known Allergies  Consultations: Orthopedics   Procedures/Studies: CT HUMERUS LEFT W CONTRAST Result Date: 01/19/2024 CLINICAL DATA:  Left upper arm infection.  History of IV drug use. EXAM: CT OF THE UPPER LEFT EXTREMITY WITH CONTRAST TECHNIQUE: Multidetector CT imaging of the left upper arm was performed according to the standard protocol following intravenous contrast administration. RADIATION DOSE REDUCTION: This exam was performed according to the departmental dose-optimization program which includes automated exposure control, adjustment of the mA and/or kV according to patient size and/or use of iterative reconstruction technique. CONTRAST:  75mL OMNIPAQUE IOHEXOL 350 MG/ML SOLN COMPARISON:  Left forearm x-rays from yesterday. Left humerus x-rays dated May 03, 2019. FINDINGS: Bones/Joint/Cartilage No bony destruction or periosteal reaction. No fracture or dislocation. Joint spaces are preserved. No joint effusion. Ligaments Ligaments are suboptimally evaluated by CT. Muscles and Tendons Grossly intact.  No muscle atrophy. Soft tissue No soft tissue swelling in the upper arm. Single tiny focus of gas within an 1 cm superficial fluid collection along the medial elbow (series 8, image 123; series 5, image 36). No soft tissue mass. IMPRESSION: 1. No acute osseous abnormality. No evidence of soft tissue infection in the upper arm. 2. Single tiny focus of gas within an 1 cm superficial fluid collection along the medial elbow, concerning for small abscess versus recent injection site. Electronically Signed   By: Obie Dredge M.D.   On: 01/19/2024 08:21   DG Forearm Right Addendum Date: 01/17/2024 ADDENDUM REPORT: 01/17/2024 01:00 ADDENDUM: Addendum to correct dictation error. Impression #3 should state "NO acute fracture or  evidence of osteomyelitis in the right forearm." This change was discussed by telephone on 01/17/2024 at 1:00 am to provider BROOKE SMALL , who verbally acknowledged these results. Electronically Signed   By: Minerva Fester M.D.   On: 01/17/2024 01:00   Result Date: 01/17/2024 CLINICAL DATA:  Encounter for abscess and IV drug use. Multiple infected sores on the bilateral lower arms. EXAM: RIGHT FOREARM - 2 VIEW; LEFT FOREARM - 2 VIEW COMPARISON:  None Available. FINDINGS: Left: Soft tissue swelling about the left forearm. Multiple focal areas of soft tissue swelling containing locules of gas compatible with abscesses. Thin 8 mm linear metallic density likely a foreign body in the ulnar/volar proximal forearm. No acute fracture. No radiographic evidence of osteomyelitis. Right: No acute fracture or dislocation. No radiographic evidence of osteomyelitis. Mild soft tissue swelling. IMPRESSION: 1. Multiple areas of focal swelling with locules of gas suspicious for abscesses in the left forearm. No evidence of osteomyelitis. 2. 8 mm thin metallic density in the proximal left forearm suspicious for needle fragment. 3. Acute fracture evidence of osteomyelitis in the right forearm. Electronically Signed: By: Angelique Holm.D.  On: 01/17/2024 00:24   DG Forearm Left Addendum Date: 01/17/2024 ADDENDUM REPORT: 01/17/2024 01:00 ADDENDUM: Addendum to correct dictation error. Impression #3 should state "NO acute fracture or evidence of osteomyelitis in the right forearm." This change was discussed by telephone on 01/17/2024 at 1:00 am to provider BROOKE SMALL , who verbally acknowledged these results. Electronically Signed   By: Minerva Fester M.D.   On: 01/17/2024 01:00   Result Date: 01/17/2024 CLINICAL DATA:  Encounter for abscess and IV drug use. Multiple infected sores on the bilateral lower arms. EXAM: RIGHT FOREARM - 2 VIEW; LEFT FOREARM - 2 VIEW COMPARISON:  None Available. FINDINGS: Left: Soft tissue swelling  about the left forearm. Multiple focal areas of soft tissue swelling containing locules of gas compatible with abscesses. Thin 8 mm linear metallic density likely a foreign body in the ulnar/volar proximal forearm. No acute fracture. No radiographic evidence of osteomyelitis. Right: No acute fracture or dislocation. No radiographic evidence of osteomyelitis. Mild soft tissue swelling. IMPRESSION: 1. Multiple areas of focal swelling with locules of gas suspicious for abscesses in the left forearm. No evidence of osteomyelitis. 2. 8 mm thin metallic density in the proximal left forearm suspicious for needle fragment. 3. Acute fracture evidence of osteomyelitis in the right forearm. Electronically Signed: By: Minerva Fester M.D. On: 01/17/2024 00:24     Discharge Exam: Vitals:   01/19/24 0502 01/19/24 0748  BP: 104/60 104/67  Pulse: 70 87  Resp:  16  Temp: 98.6 F (37 C)   SpO2: 100% 100%   Vitals:   01/18/24 1619 01/18/24 1919 01/19/24 0502 01/19/24 0748  BP: (!) 112/58 104/62 104/60 104/67  Pulse: 74 73 70 87  Resp: 16 16  16   Temp: 98.7 F (37.1 C) 98.5 F (36.9 C) 98.6 F (37 C)   TempSrc: Oral Oral    SpO2: 99% 99% 100% 100%  Weight:      Height:        General: Pt is alert, awake, not in acute distress Cardiovascular: RRR, S1/S2 +, no rubs, no gallops Respiratory: CTA bilaterally, no wheezing, no rhonchi Abdominal: Soft, NT, ND, bowel sounds + Extremities: no edema, no cyanosis, slight edema in the left upper extremity, temperature is equal, no tenderness.  Multiple scabs.    The results of significant diagnostics from this hospitalization (including imaging, microbiology, ancillary and laboratory) are listed below for reference.     Microbiology: Recent Results (from the past 240 hours)  Culture, blood (Routine x 2)     Status: None (Preliminary result)   Collection Time: 01/16/24 11:27 PM   Specimen: BLOOD RIGHT ARM  Result Value Ref Range Status   Specimen  Description BLOOD RIGHT ARM  Final   Special Requests   Final    BOTTLES DRAWN AEROBIC AND ANAEROBIC Blood Culture adequate volume   Culture   Final    NO GROWTH 2 DAYS Performed at [facility], [address] [place], [place] [address]    Report Status PENDING  Incomplete  Culture, blood (Routine x 2)     Status: Abnormal   Collection Time: 01/16/24 11:31 PM   Specimen: BLOOD RIGHT HAND  Result Value Ref Range Status   Specimen Description BLOOD RIGHT HAND  Final   Special Requests   Final    BOTTLES DRAWN AEROBIC AND ANAEROBIC Blood Culture adequate volume   Culture  Setup Time   Final    GRAM POSITIVE COCCI IN CLUSTERS ANAEROBIC BOTTLE ONLY CRITICAL RESULT  CALLED TO, READ BACK BY AND VERIFIED WITH: PHARMD K. HURTH [id number] @ 2121 FH    Culture (A)  Final    STAPHYLOCOCCUS EPIDERMIDIS THE SIGNIFICANCE OF ISOLATING THIS ORGANISM FROM A SINGLE SET OF BLOOD CULTURES WHEN MULTIPLE SETS ARE DRAWN IS UNCERTAIN. PLEASE NOTIFY THE MICROBIOLOGY DEPARTMENT WITHIN ONE WEEK IF SPECIATION AND SENSITIVITIES ARE REQUIRED. Performed at [facility], [address], [place], [place] [address]    Report Status 01/19/2024 FINAL  Final  Blood Culture ID Panel (Reflexed)     Status: Abnormal   Collection Time: 01/16/24 11:31 PM  Result Value Ref Range Status   Enterococcus faecalis NOT DETECTED NOT DETECTED Final   Enterococcus Faecium NOT DETECTED NOT DETECTED Final   Listeria monocytogenes NOT DETECTED NOT DETECTED Final   Staphylococcus species DETECTED (A) NOT DETECTED Final    Comment: CRITICAL RESULT CALLED TO, READ BACK BY AND VERIFIED WITH: PHARMD K. HURTH [id number] @2121  FH    Staphylococcus aureus (BCID) NOT DETECTED NOT DETECTED Final   Staphylococcus epidermidis DETECTED (A) NOT DETECTED Final    Comment: CRITICAL RESULT CALLED TO, READ BACK BY AND VERIFIED WITH: PHARMD K. HURTH [id number] @2121  FH    Staphylococcus lugdunensis NOT DETECTED NOT DETECTED Final   Streptococcus  species NOT DETECTED NOT DETECTED Final   Streptococcus agalactiae NOT DETECTED NOT DETECTED Final   Streptococcus pneumoniae NOT DETECTED NOT DETECTED Final   Streptococcus pyogenes NOT DETECTED NOT DETECTED Final   A.calcoaceticus-baumannii NOT DETECTED NOT DETECTED Final   Bacteroides fragilis NOT DETECTED NOT DETECTED Final   Enterobacterales NOT DETECTED NOT DETECTED Final   Enterobacter cloacae complex NOT DETECTED NOT DETECTED Final   Escherichia coli NOT DETECTED NOT DETECTED Final   Klebsiella aerogenes NOT DETECTED NOT DETECTED Final   Klebsiella oxytoca NOT DETECTED NOT DETECTED Final   Klebsiella pneumoniae NOT DETECTED NOT DETECTED Final   Proteus species NOT DETECTED NOT DETECTED Final   Salmonella species NOT DETECTED NOT DETECTED Final   Serratia marcescens NOT DETECTED NOT DETECTED Final   Haemophilus influenzae NOT DETECTED NOT DETECTED Final   Neisseria meningitidis NOT DETECTED NOT DETECTED Final   Pseudomonas aeruginosa NOT DETECTED NOT DETECTED Final   Stenotrophomonas maltophilia NOT DETECTED NOT DETECTED Final   Candida albicans NOT DETECTED NOT DETECTED Final   Candida auris NOT DETECTED NOT DETECTED Final   Candida glabrata NOT DETECTED NOT DETECTED Final   Candida krusei NOT DETECTED NOT DETECTED Final   Candida parapsilosis NOT DETECTED NOT DETECTED Final   Candida tropicalis NOT DETECTED NOT DETECTED Final   Cryptococcus neoformans/gattii NOT DETECTED NOT DETECTED Final   Methicillin resistance mecA/C NOT DETECTED NOT DETECTED Final    Comment: Performed at [facility], [address], [place], [place] [address]     Labs: BNP (last 3 results) No results for input(s): "BNP" in the last 8760 hours. Basic Metabolic Panel: Recent Labs  Lab 01/16/24 2331 01/17/24 0552 01/18/24 0605  NA 136  --  138  K 4.2  --  4.1  CL 101  --  101  CO2 25  --  25  GLUCOSE 86  --  89  BUN 11  --  13  CREATININE 1.07 1.22 1.16  CALCIUM 9.0  --  9.3   Liver  Function Tests: Recent Labs  Lab 01/16/24 2331  AST 20  ALT 18  ALKPHOS 51  BILITOT 0.4  PROT 7.6  ALBUMIN 3.7   No results for input(s): "LIPASE", "AMYLASE" in  the last 168 hours. No results for input(s): "AMMONIA" in the last 168 hours. CBC: Recent Labs  Lab 01/16/24 2331 01/17/24 0552 01/18/24 0605  WBC 9.0 7.9 4.7  NEUTROABS 5.2  --  2.0  HGB 13.7 12.4* 13.7  HCT 41.4 37.7* 41.0  MCV 93.7 94.7 92.1  PLT 302 264 272   Cardiac Enzymes: No results for input(s): "CKTOTAL", "CKMB", "CKMBINDEX", "TROPONINI" in the last 168 hours. BNP: Invalid input(s): "POCBNP" CBG: No results for input(s): "GLUCAP" in the last 168 hours. D-Dimer No results for input(s): "DDIMER" in the last 72 hours. Hgb A1c No results for input(s): "HGBA1C" in the last 72 hours. Lipid Profile No results for input(s): "CHOL", "HDL", "LDLCALC", "TRIG", "CHOLHDL", "LDLDIRECT" in the last 72 hours. Thyroid function studies No results for input(s): "TSH", "T4TOTAL", "T3FREE", "THYROIDAB" in the last 72 hours.  Invalid input(s): "FREET3" Anemia work up No results for input(s): "VITAMINB12", "FOLATE", "FERRITIN", "TIBC", "IRON", "RETICCTPCT" in the last 72 hours. Urinalysis    Component Value Date/Time   COLORURINE YELLOW 06/13/2023 1811   APPEARANCEUR CLEAR 06/13/2023 1811   LABSPEC 1.026 06/13/2023 1811   PHURINE 5.0 06/13/2023 1811   GLUCOSEU NEGATIVE 06/13/2023 1811   HGBUR NEGATIVE 06/13/2023 1811   BILIRUBINUR NEGATIVE 06/13/2023 1811   KETONESUR NEGATIVE 06/13/2023 1811   PROTEINUR NEGATIVE 06/13/2023 1811   UROBILINOGEN 0.2 11/26/2018 1233   NITRITE NEGATIVE 06/13/2023 1811   LEUKOCYTESUR NEGATIVE 06/13/2023 1811   Sepsis Labs Recent Labs  Lab 01/16/24 2331 01/17/24 0552 01/18/24 0605  WBC 9.0 7.9 4.7   Microbiology Recent Results (from the past 240 hours)  Culture, blood (Routine x 2)     Status: None (Preliminary result)   Collection Time: 01/16/24 11:27 PM   Specimen: BLOOD  RIGHT ARM  Result Value Ref Range Status   Specimen Description BLOOD RIGHT ARM  Final   Special Requests   Final    BOTTLES DRAWN AEROBIC AND ANAEROBIC Blood Culture adequate volume   Culture   Final    NO GROWTH 2 DAYS Performed at [facility], [address] [place], [place] [address]    Report Status PENDING  Incomplete  Culture, blood (Routine x 2)     Status: Abnormal   Collection Time: 01/16/24 11:31 PM   Specimen: BLOOD RIGHT HAND  Result Value Ref Range Status   Specimen Description BLOOD RIGHT HAND  Final   Special Requests   Final    BOTTLES DRAWN AEROBIC AND ANAEROBIC Blood Culture adequate volume   Culture  Setup Time   Final    GRAM POSITIVE COCCI IN CLUSTERS ANAEROBIC BOTTLE ONLY CRITICAL RESULT CALLED TO, READ BACK BY AND VERIFIED WITH: PHARMD K. HURTH [id number] @ 2121 FH    Culture (A)  Final    STAPHYLOCOCCUS EPIDERMIDIS THE SIGNIFICANCE OF ISOLATING THIS ORGANISM FROM A SINGLE SET OF BLOOD CULTURES WHEN MULTIPLE SETS ARE DRAWN IS UNCERTAIN. PLEASE NOTIFY THE MICROBIOLOGY DEPARTMENT WITHIN ONE WEEK IF SPECIATION AND SENSITIVITIES ARE REQUIRED. Performed at [facility], [address] [place], [place] [address]    Report Status 01/19/2024 FINAL  Final  Blood Culture ID Panel (Reflexed)     Status: Abnormal   Collection Time: 01/16/24 11:31 PM  Result Value Ref Range Status   Enterococcus faecalis NOT DETECTED NOT DETECTED Final   Enterococcus Faecium NOT DETECTED NOT DETECTED Final   Listeria monocytogenes NOT DETECTED NOT DETECTED Final   Staphylococcus species DETECTED (A) NOT DETECTED Final    Comment:  CRITICAL RESULT CALLED TO, READ BACK BY AND VERIFIED WITH: PHARMD K. HURTH [id number] @2121  FH    Staphylococcus aureus (BCID) NOT DETECTED NOT DETECTED Final   Staphylococcus epidermidis DETECTED (A) NOT DETECTED Final    Comment: CRITICAL RESULT CALLED TO, READ BACK BY AND VERIFIED WITH: PHARMD K. HURTH [id number] @2121  FH    Staphylococcus  lugdunensis NOT DETECTED NOT DETECTED Final   Streptococcus species NOT DETECTED NOT DETECTED Final   Streptococcus agalactiae NOT DETECTED NOT DETECTED Final   Streptococcus pneumoniae NOT DETECTED NOT DETECTED Final   Streptococcus pyogenes NOT DETECTED NOT DETECTED Final   A.calcoaceticus-baumannii NOT DETECTED NOT DETECTED Final   Bacteroides fragilis NOT DETECTED NOT DETECTED Final   Enterobacterales NOT DETECTED NOT DETECTED Final   Enterobacter cloacae complex NOT DETECTED NOT DETECTED Final   Escherichia coli NOT DETECTED NOT DETECTED Final   Klebsiella aerogenes NOT DETECTED NOT DETECTED Final   Klebsiella oxytoca NOT DETECTED NOT DETECTED Final   Klebsiella pneumoniae NOT DETECTED NOT DETECTED Final   Proteus species NOT DETECTED NOT DETECTED Final   Salmonella species NOT DETECTED NOT DETECTED Final   Serratia marcescens NOT DETECTED NOT DETECTED Final   Haemophilus influenzae NOT DETECTED NOT DETECTED Final   Neisseria meningitidis NOT DETECTED NOT DETECTED Final   Pseudomonas aeruginosa NOT DETECTED NOT DETECTED Final   Stenotrophomonas maltophilia NOT DETECTED NOT DETECTED Final   Candida albicans NOT DETECTED NOT DETECTED Final   Candida auris NOT DETECTED NOT DETECTED Final   Candida glabrata NOT DETECTED NOT DETECTED Final   Candida krusei NOT DETECTED NOT DETECTED Final   Candida parapsilosis NOT DETECTED NOT DETECTED Final   Candida tropicalis NOT DETECTED NOT DETECTED Final   Cryptococcus neoformans/gattii NOT DETECTED NOT DETECTED Final   Methicillin resistance mecA/C NOT DETECTED NOT DETECTED Final    Comment: Performed at [facility], [address] [place], [place] [address]    FURTHER DISCHARGE INSTRUCTIONS:   Get Medicines reviewed and adjusted: Please take all your medications with you for your next visit with your Primary MD   Laboratory/radiological data: Please request your Primary MD to go over all hospital tests and procedure/radiological  results at the follow up, please ask your Primary MD to get all Hospital records sent to his/her office.   In some cases, they will be blood work, cultures and biopsy results pending at the time of your discharge. Please request that your primary care M.D. goes through all the records of your hospital data and follows up on these results.   Also Note the following: If you experience worsening of your admission symptoms, develop shortness of breath, life threatening emergency, suicidal or homicidal thoughts you must seek medical attention immediately by calling 911 or calling your MD immediately  if symptoms less severe.   You must read complete instructions/literature along with all the possible adverse reactions/side effects for all the Medicines you take and that have been prescribed to you. Take any new Medicines after you have completely understood and accpet all the possible adverse reactions/side effects.    Do not drive when taking Pain medications or sleeping medications (Benzodaizepines)   Do not take more than prescribed Pain, Sleep and Anxiety Medications. It is not advisable to combine anxiety,sleep and pain medications without talking with your primary care practitioner   Special Instructions: If you have smoked or chewed Tobacco  in the last 2 yrs please stop smoking, stop any regular Alcohol  and or any Recreational drug use.  Wear Seat belts while driving.   Please note: You were cared for by a hospitalist during your hospital stay. Once you are discharged, your primary care physician will handle any further medical issues. Please note that NO REFILLS for any discharge medications will be authorized once you are discharged, as it is imperative that you return to your primary care physician (or establish a relationship with a primary care physician if you do not have one) for your post hospital discharge needs so that they can reassess your need for medications and monitor your lab  values  Time coordinating discharge: Over 30 minutes  SIGNED:   Hughie Closs, MD  [facility] 01/19/2024, 11:00 AM *Please note that this is a verbal dictation therefore any spelling or grammatical errors are due to the "Dragon [employer] One" system interpretation. If 7PM-7AM, please contact night-coverage [email or web address]

## 2024-01-22 LAB — CULTURE, BLOOD (ROUTINE X 2)
Culture: NO GROWTH
Special Requests: ADEQUATE

## 2024-01-23 ENCOUNTER — Other Ambulatory Visit (HOSPITAL_COMMUNITY): Payer: Self-pay

## 2024-01-23 ENCOUNTER — Telehealth: Payer: Self-pay

## 2024-01-23 NOTE — Telephone Encounter (Signed)
[facility] Patient Advocate Encounter  Insurance verification completed.    The patient is insured through [employer] [place].   Ran test claim for BIKTARVY  The current 30 day co-pay is $0. Filled last at [facility] on 01/18/24  Ran test claim for DOVATO  The current 30 day co-pay is $0.  Ran test claim for [facility]  The current 30 day co-pay is $0.   We will continue to follow to see if copay assistance is needed.  This test claim was processed through [facility]- copay amounts may vary at other pharmacies due to pharmacy/plan contracts, or as the patient moves through the different stages of their insurance plan.

## 2024-01-28 ENCOUNTER — Encounter: Payer: Self-pay | Admitting: Infectious Disease

## 2024-01-28 DIAGNOSIS — F1990 Other psychoactive substance use, unspecified, uncomplicated: Secondary | ICD-10-CM | POA: Insufficient documentation

## 2024-01-28 HISTORY — DX: Other psychoactive substance use, unspecified, uncomplicated: F19.90

## 2024-01-28 NOTE — Progress Notes (Deleted)
   Subjective:    Patient ID: Kenneth Becton., male    DOB: 1986/11/08, 38 y.o.   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  No family history on file.    Social History   Socioeconomic History   Marital status: Single    Spouse name: Not on file   Number of children: Not on file   Years of education: Not on file   Highest education level: Not on file  Occupational History   Not on file  Tobacco Use   Smoking status: Never   Smokeless tobacco: Never  Vaping Use   Vaping status: Never Used  Substance and Sexual Activity   Alcohol use: Never   Drug use: Yes    Types: Marijuana   Sexual activity: Not Currently    Comment: accepted condoms  Other Topics Concern   Not on file  Social History Narrative   ** Merged History Encounter **       Social Drivers of Health   Financial Resource Strain: Not on file  Food Insecurity: Food Insecurity Present (01/17/2024)   Hunger Vital Sign    Worried About Running Out of Food in the Last Year: Often true    Ran Out of Food in the Last Year: Often true  Transportation Needs: Unmet Transportation Needs (01/17/2024)   PRAPARE - Administrator, Civil Service (Medical): Yes    Lack of Transportation (Non-Medical): Yes  Physical Activity: Not on file  Stress: Not on file  Social Connections: Not on file    No Known Allergies   Current Outpatient Medications:    ARIPiprazole ER (ABILIFY MAINTENA) 400 MG SRER  injection, Inject 1.5 mLs (300 mg total) into the muscle every 28 (twenty-eight) days., Disp: 1.5 mL, Rfl: 0   bictegravir-emtricitabine-tenofovir AF (BIKTARVY) 50-200-25 MG TABS tablet, Take 1 tablet by mouth daily., Disp: 30 tablet, Rfl: 0   cloNIDine HCl (KAPVAY) 0.1 MG TB12 ER tablet, Take 1 tablet (0.1 mg total) by mouth in the morning., Disp: 30 tablet, Rfl: 1   sulfamethoxazole-trimethoprim (BACTRIM DS) 800-160 MG tablet, Take 1 tablet by mouth 2 (two) times daily for 10 days., Disp: 20 tablet, Rfl: 0   traZODone (DESYREL) 50 MG tablet, Take 0.5 tablets (25 mg total) by mouth at bedtime as needed and may repeat dose one time if needed for sleep. (Patient not taking: Reported on 01/17/2024), Disp: 14 tablet, Rfl: 0   Review of Systems     Objective:   Physical Exam        Assessment & Plan:

## 2024-01-29 ENCOUNTER — Ambulatory Visit: Payer: MEDICAID | Admitting: Infectious Disease

## 2024-01-29 DIAGNOSIS — F1520 Other stimulant dependence, uncomplicated: Secondary | ICD-10-CM

## 2024-01-29 DIAGNOSIS — F1990 Other psychoactive substance use, unspecified, uncomplicated: Secondary | ICD-10-CM

## 2024-01-29 DIAGNOSIS — F251 Schizoaffective disorder, depressive type: Secondary | ICD-10-CM

## 2024-01-29 DIAGNOSIS — F1290 Cannabis use, unspecified, uncomplicated: Secondary | ICD-10-CM

## 2024-02-13 ENCOUNTER — Ambulatory Visit: Payer: MEDICAID | Admitting: Infectious Diseases

## 2024-02-13 NOTE — Progress Notes (Deleted)
 Name: Kenneth Welch.  DOB: [birth year] MRN: [id number] PCP: [facility]    Brief Narrative:  Kenneth Welch. is a 38 y.o. male with HIV, Stage ***, diagnosed on ***. CD4 nadir *** VL *** Transmission Risk: *** History of OIs: *** History of STIs: Hep B sAg (***), sAb (***), cAb (***); Hep A (***), Hep C (***) Quantiferon (***) HLA B*5701 (***) G6PD: (***)   Previous Regimens: ***  Genotypes: ***  Subjective   Subjective:  No chief complaint on file.    Discussed the use of AI scribe software for clinical note transcription with the patient, who gave verbal consent to proceed.  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 1.5 mL 0   bictegravir-emtricitabine-tenofovir AF (BIKTARVY) 50-200-25 MG TABS tablet Take 1 tablet by mouth daily. 30 tablet 0   cloNIDine HCl (KAPVAY) 0.1 MG TB12 ER tablet Take 1 tablet (0.1 mg total) by mouth in the morning. 30 tablet 1   traZODone (DESYREL) 50 MG tablet Take 0.5 tablets (25 mg total) by mouth at bedtime as needed and may repeat dose one time if  needed for sleep. (Patient not taking: Reported on 01/17/2024) 14 tablet 0   No facility-administered medications prior to visit.     No Known Allergies  Social History   Tobacco Use   Smoking status: Never   Smokeless tobacco: Never  Vaping Use   Vaping status: Never Used  Substance Use Topics   Alcohol use: Never   Drug use: Yes    Types: Marijuana    No family history on file.  Social History   Substance and Sexual Activity  Sexual Activity Not Currently   Comment: accepted condoms        Objective   Objective:  There were no vitals filed for this visit. There is no height or weight on file to calculate BMI.  Physical Exam Physical Exam             Assessment & Plan:   Problem List Items Addressed This Visit   None   Assessment and Plan              No orders of the defined types were placed in this encounter.   No orders of the defined types were placed in this encounter.   No follow-ups on file.   Rexene Alberts, MSN, NP-C [facility] for Infectious Disease [facility]  [email or web address] Pager: [phone] Office: [phone] [facility] Main Line: [phone] *Secure Chat Communication  Welcome

## 2024-02-16 ENCOUNTER — Encounter (HOSPITAL_COMMUNITY): Payer: Self-pay | Admitting: *Deleted

## 2024-02-16 ENCOUNTER — Emergency Department (HOSPITAL_COMMUNITY)
Admission: EM | Admit: 2024-02-16 | Discharge: 2024-02-16 | Payer: MEDICAID | Attending: Emergency Medicine | Admitting: Emergency Medicine

## 2024-02-16 ENCOUNTER — Other Ambulatory Visit: Payer: Self-pay

## 2024-02-16 DIAGNOSIS — S0990XA Unspecified injury of head, initial encounter: Secondary | ICD-10-CM | POA: Diagnosis present

## 2024-02-16 DIAGNOSIS — W228XXA Striking against or struck by other objects, initial encounter: Secondary | ICD-10-CM | POA: Diagnosis not present

## 2024-02-16 NOTE — ED Triage Notes (Signed)
 BIB [employer], arrested for trespassing, become upset and hit head multiple times (6-7) against window. Pt has pain in left forehead 3/10, no noted trauma.

## 2024-02-16 NOTE — ED Provider Notes (Signed)
  [place] EMERGENCY DEPARTMENT AT [facility] Provider Note   CSN: [id number] Arrival date & time: 02/16/24  1345     History  Chief Complaint  Patient presents with   Clearance for [facility] Sanjeev Main. is a 38 y.o. male.  38 year old male here today after he hit his head on the plastic divider in the back of a police car after he was arrested.  Patient says that he was frustrated.  He does not take blood thinners no LOC.        Home Medications Prior to Admission medications   Medication Sig Start Date End Date Taking? Authorizing Provider  ARIPiprazole ER (ABILIFY MAINTENA) 400 MG SRER injection Inject 1.5 mLs (300 mg total) into the muscle every 28 (twenty-eight) days. 12/28/23   Golda Acre, MD  bictegravir-emtricitabine-tenofovir AF (BIKTARVY) 50-200-25 MG TABS tablet Take 1 tablet by mouth daily. 01/18/24 02/17/24  Vu, Gershon Mussel T, MD  cloNIDine HCl (KAPVAY) 0.1 MG TB12 ER tablet Take 1 tablet (0.1 mg total) by mouth in the morning. 01/19/24   Hughie Closs, MD  traZODone (DESYREL) 50 MG tablet Take 0.5 tablets (25 mg total) by mouth at bedtime as needed and may repeat dose one time if needed for sleep. Patient not taking: Reported on 01/17/2024 12/01/23   Golda Acre, MD      Allergies    Patient has no known allergies.    Review of Systems   Review of Systems  Physical Exam Updated Vital Signs BP 139/77 (BP Location: Right Arm)   Pulse 84   Temp 98.3 F (36.8 C) (Oral)   Resp 18   SpO2 100%  Physical Exam Nursing note reviewed.  HENT:     Head: Normocephalic and atraumatic.  Neurological:     General: No focal deficit present.     Mental Status: He is alert.     Cranial Nerves: No cranial nerve deficit.     Sensory: No sensory deficit.     Motor: No weakness.     Gait: Gait normal.     ED Results / Procedures / Treatments   Labs (all labs ordered are listed, but only abnormal results are displayed) Labs Reviewed - No data  to display  EKG None  Radiology No results found.  Procedures Procedures    Medications Ordered in ED Medications - No data to display  ED Course/ Medical Decision Making/ A&P                                 Medical Decision Making 38 year old male here today after he struck his head.  Plan-patient without any overt trauma to the head.  He has no neurological deficits.  Applying [place] head CT rules, do not believe patient requires any additional testing, observation or imaging at this time.  Patient will be discharged into police custody.           Final Clinical Impression(s) / ED Diagnoses Final diagnoses:  Minor head injury without loss of consciousness, initial encounter    Rx / DC Orders ED Discharge Orders     None         Arletha Pili, DO 02/16/24 1415

## 2024-02-16 NOTE — Discharge Instructions (Addendum)
 Kenneth Welch has been evaluated in the emergency room.  He is medically cleared.

## 2024-02-18 ENCOUNTER — Ambulatory Visit (HOSPITAL_COMMUNITY)
Admission: EM | Admit: 2024-02-18 | Discharge: 2024-02-19 | Payer: MEDICAID | Attending: Psychiatry | Admitting: Psychiatry

## 2024-02-18 DIAGNOSIS — F1520 Other stimulant dependence, uncomplicated: Secondary | ICD-10-CM | POA: Insufficient documentation

## 2024-02-18 DIAGNOSIS — Z21 Asymptomatic human immunodeficiency virus [HIV] infection status: Secondary | ICD-10-CM | POA: Insufficient documentation

## 2024-02-18 DIAGNOSIS — F259 Schizoaffective disorder, unspecified: Secondary | ICD-10-CM | POA: Insufficient documentation

## 2024-02-18 DIAGNOSIS — F4310 Post-traumatic stress disorder, unspecified: Secondary | ICD-10-CM | POA: Insufficient documentation

## 2024-02-18 LAB — CBC WITH DIFFERENTIAL/PLATELET
Abs Immature Granulocytes: 0.02 10*3/uL (ref 0.00–0.07)
Basophils Absolute: 0 10*3/uL (ref 0.0–0.1)
Basophils Relative: 1 %
Eosinophils Absolute: 0.1 10*3/uL (ref 0.0–0.5)
Eosinophils Relative: 2 %
HCT: 45 % (ref 39.0–52.0)
Hemoglobin: 15 g/dL (ref 13.0–17.0)
Immature Granulocytes: 0 %
Lymphocytes Relative: 31 %
Lymphs Abs: 2 10*3/uL (ref 0.7–4.0)
MCH: 31.4 pg (ref 26.0–34.0)
MCHC: 33.3 g/dL (ref 30.0–36.0)
MCV: 94.1 fL (ref 80.0–100.0)
Monocytes Absolute: 0.6 10*3/uL (ref 0.1–1.0)
Monocytes Relative: 9 %
Neutro Abs: 3.8 10*3/uL (ref 1.7–7.7)
Neutrophils Relative %: 57 %
Platelets: 238 10*3/uL (ref 150–400)
RBC: 4.78 MIL/uL (ref 4.22–5.81)
RDW: 14.5 % (ref 11.5–15.5)
WBC: 6.6 10*3/uL (ref 4.0–10.5)
nRBC: 0 % (ref 0.0–0.2)

## 2024-02-18 LAB — COMPREHENSIVE METABOLIC PANEL
ALT: 21 U/L (ref 0–44)
AST: 19 U/L (ref 15–41)
Albumin: 4 g/dL (ref 3.5–5.0)
Alkaline Phosphatase: 54 U/L (ref 38–126)
Anion gap: 8 (ref 5–15)
BUN: 15 mg/dL (ref 6–20)
CO2: 28 mmol/L (ref 22–32)
Calcium: 9.6 mg/dL (ref 8.9–10.3)
Chloride: 100 mmol/L (ref 98–111)
Creatinine, Ser: 1.09 mg/dL (ref 0.61–1.24)
GFR, Estimated: >60 mL/min (ref >60)
Glucose, Bld: 141 mg/dL — ABNORMAL HIGH (ref 70–99)
Potassium: 4.2 mmol/L (ref 3.5–5.1)
Sodium: 136 mmol/L (ref 135–145)
Total Bilirubin: 0.6 mg/dL (ref 0.0–1.2)
Total Protein: 7.9 g/dL (ref 6.5–8.1)

## 2024-02-18 LAB — POCT URINE DRUG SCREEN - MANUAL ENTRY (I-SCREEN)
POC Amphetamine UR: NOT DETECTED
POC Buprenorphine (BUP): NOT DETECTED
POC Cocaine UR: NOT DETECTED
POC Marijuana UR: NOT DETECTED
POC Methadone UR: NOT DETECTED
POC Methamphetamine UR: POSITIVE — AB
POC Morphine: NOT DETECTED
POC Oxazepam (BZO): NOT DETECTED
POC Oxycodone UR: NOT DETECTED
POC Secobarbital (BAR): NOT DETECTED

## 2024-02-18 LAB — HEMOGLOBIN A1C
Hgb A1c MFr Bld: 5.6 % (ref 4.8–5.6)
Mean Plasma Glucose: 114.02 mg/dL

## 2024-02-18 LAB — TSH: TSH: 3.451 u[IU]/mL (ref 0.350–4.500)

## 2024-02-18 LAB — LIPID PANEL
Cholesterol: 154 mg/dL (ref 0–200)
HDL: 47 mg/dL (ref >40)
LDL Cholesterol: 94 mg/dL (ref 0–99)
Total CHOL/HDL Ratio: 3.3 ratio
Triglycerides: 63 mg/dL (ref <150)
VLDL: 13 mg/dL (ref 0–40)

## 2024-02-18 LAB — ETHANOL: Alcohol, Ethyl (B): <10 mg/dL (ref <10)

## 2024-02-18 MED ORDER — HALOPERIDOL LACTATE 5 MG/ML IJ SOLN: 10.0000 mg | Freq: Three times a day (TID) | INTRAMUSCULAR | Status: DC | PRN

## 2024-02-18 MED ORDER — DIPHENHYDRAMINE HCL 50 MG/ML IJ SOLN: 50.0000 mg | Freq: Three times a day (TID) | INTRAMUSCULAR | Status: DC | PRN

## 2024-02-18 MED ORDER — ALUM & MAG HYDROXIDE-SIMETH 200-200-20 MG/5ML PO SUSP: 30.0000 mL | ORAL | Status: DC | PRN

## 2024-02-18 MED ORDER — TRAZODONE HCL 50 MG PO TABS: 50.0000 mg | ORAL_TABLET | Freq: Every evening | ORAL | Status: DC | PRN

## 2024-02-18 MED ORDER — HALOPERIDOL LACTATE 5 MG/ML IJ SOLN: 5.0000 mg | Freq: Three times a day (TID) | INTRAMUSCULAR | Status: DC | PRN

## 2024-02-18 MED ORDER — ACETAMINOPHEN 325 MG PO TABS: 650.0000 mg | ORAL_TABLET | Freq: Four times a day (QID) | ORAL | Status: DC | PRN

## 2024-02-18 MED ORDER — DIPHENHYDRAMINE HCL 50 MG PO CAPS: 50.0000 mg | ORAL_CAPSULE | Freq: Three times a day (TID) | ORAL | Status: DC | PRN

## 2024-02-18 MED ORDER — HYDROXYZINE HCL 25 MG PO TABS: 25.0000 mg | ORAL_TABLET | Freq: Three times a day (TID) | ORAL | Status: DC | PRN

## 2024-02-18 MED ORDER — MAGNESIUM HYDROXIDE 400 MG/5ML PO SUSP: 30.0000 mL | Freq: Every day | ORAL | Status: DC | PRN

## 2024-02-18 MED ORDER — LORAZEPAM 2 MG/ML IJ SOLN: 2.0000 mg | Freq: Three times a day (TID) | INTRAMUSCULAR | Status: DC | PRN

## 2024-02-18 MED ORDER — HALOPERIDOL 5 MG PO TABS: 5.0000 mg | ORAL_TABLET | Freq: Three times a day (TID) | ORAL | Status: DC | PRN

## 2024-02-18 NOTE — Progress Notes (Signed)
   02/18/24 1949  [facility] Triage Screening (Walk-ins at [facility] only)  How Did You Hear About [place]? Legal System  What Is the Reason for Your Visit/Call Today? Pt presents to [facility] voluntarily via [employer].  Pt reports SI with a plan to overdose on medication.  Pt deneis HI, and AVH.  Pt reports using methamphetamines.  Pt reports he was just released from jailed today, his car have been removed from  prior home "I stressed".  Pt reports he is homeless and stressed.  Pt reports prior MH diagnosises or prescribed medication for symptom managment.  How Long Has This Been Causing You Problems? <Week  Have You Recently Had Any Thoughts About Hurting Yourself? Yes  How long ago did you have thoughts about hurting yourself? 24 hours  Are You Planning to Commit Suicide/Harm Yourself At This time? No  Have you Recently Had Thoughts About Hurting Someone Karolee Ohs? No  Are You Planning To Harm Someone At This Time? No  Physical Abuse Denies  Verbal Abuse Denies  Sexual Abuse Denies  Exploitation of patient/patient's resources Denies  Self-Neglect Denies  Possible abuse reported to: Other (Comment) (n/a)  Are you currently experiencing any auditory, visual or other hallucinations? No  Have You Used Any Alcohol or Drugs in the Past 24 Hours? Yes  What Did You Use and How Much? Pt reports taking Meth.  Do you have any current medical co-morbidities that require immediate attention? No  Clinician description of patient physical appearance/behavior: Racing thoughts, dishelved  What Do You Feel Would Help You the Most Today? Alcohol or Drug Use Treatment;Treatment for Depression or other mood problem;Housing Assistance  If access to [facility] was not available, would you have sought care in the Emergency Department? Yes  Determination of Need Urgent (48 hours)  Options For Referral [facility]  Determination of Need filed? Yes

## 2024-02-18 NOTE — ED Notes (Signed)
 Pt is currently sleeping, no distress noted, environmental check complete, will continue to monitor patient for safety.

## 2024-02-19 ENCOUNTER — Other Ambulatory Visit (INDEPENDENT_AMBULATORY_CARE_PROVIDER_SITE_OTHER)
Admission: EM | Admit: 2024-02-19 | Discharge: 2024-02-21 | Disposition: A | Discharge status: Other Acute Inpatient Hospital | Payer: MEDICAID | Source: Home / Self Care | Attending: Psychiatry | Admitting: Psychiatry

## 2024-02-19 DIAGNOSIS — J45909 Unspecified asthma, uncomplicated: Secondary | ICD-10-CM | POA: Diagnosis not present

## 2024-02-19 DIAGNOSIS — E860 Dehydration: Secondary | ICD-10-CM | POA: Diagnosis not present

## 2024-02-19 DIAGNOSIS — R45851 Suicidal ideations: Secondary | ICD-10-CM | POA: Insufficient documentation

## 2024-02-19 DIAGNOSIS — R55 Syncope and collapse: Secondary | ICD-10-CM | POA: Diagnosis present

## 2024-02-19 DIAGNOSIS — Z79899 Other long term (current) drug therapy: Secondary | ICD-10-CM | POA: Insufficient documentation

## 2024-02-19 DIAGNOSIS — B20 Human immunodeficiency virus [HIV] disease: Secondary | ICD-10-CM

## 2024-02-19 DIAGNOSIS — F1590 Other stimulant use, unspecified, uncomplicated: Secondary | ICD-10-CM

## 2024-02-19 DIAGNOSIS — Z21 Asymptomatic human immunodeficiency virus [HIV] infection status: Secondary | ICD-10-CM | POA: Diagnosis not present

## 2024-02-19 DIAGNOSIS — F1510 Other stimulant abuse, uncomplicated: Secondary | ICD-10-CM | POA: Insufficient documentation

## 2024-02-19 DIAGNOSIS — F250 Schizoaffective disorder, bipolar type: Secondary | ICD-10-CM | POA: Diagnosis present

## 2024-02-19 DIAGNOSIS — F1990 Other psychoactive substance use, unspecified, uncomplicated: Secondary | ICD-10-CM

## 2024-02-19 DIAGNOSIS — F1994 Other psychoactive substance use, unspecified with psychoactive substance-induced mood disorder: Secondary | ICD-10-CM

## 2024-02-19 DIAGNOSIS — R739 Hyperglycemia, unspecified: Secondary | ICD-10-CM | POA: Insufficient documentation

## 2024-02-19 MED ORDER — TRAZODONE HCL 50 MG PO TABS
50.0000 mg | ORAL_TABLET | Freq: Every evening | ORAL | Status: DC | PRN
  Administered 2024-02-20: 50 mg via ORAL
  Filled 2024-02-19: qty 1

## 2024-02-19 MED ORDER — ARIPIPRAZOLE 10 MG PO TABS: 10.0000 mg | ORAL_TABLET | Freq: Every day | ORAL | Status: DC

## 2024-02-19 MED ORDER — FLUTICASONE PROPIONATE 50 MCG/ACT NA SUSP: 2.0000 | Freq: Every day | NASAL | Status: DC | PRN

## 2024-02-19 MED ORDER — LORATADINE 10 MG PO TABS: 10.0000 mg | ORAL_TABLET | Freq: Every day | ORAL | Status: DC | PRN

## 2024-02-19 MED ORDER — ESCITALOPRAM OXALATE 5 MG PO TABS
5.0000 mg | ORAL_TABLET | Freq: Every day | ORAL | Status: DC
  Filled 2024-02-19: qty 1

## 2024-02-19 MED ORDER — TRAZODONE HCL 50 MG PO TABS: 25.0000 mg | ORAL_TABLET | Freq: Every evening | ORAL | Status: DC | PRN

## 2024-02-19 MED ORDER — BICTEGRAVIR-EMTRICITAB-TENOFOV 50-200-25 MG PO TABS
1.0000 | ORAL_TABLET | Freq: Every day | ORAL | Status: DC
  Administered 2024-02-19: 1 via ORAL
  Filled 2024-02-19: qty 1

## 2024-02-19 MED ORDER — GUAIFENESIN ER 600 MG PO TB12: 600.0000 mg | ORAL_TABLET | Freq: Two times a day (BID) | ORAL | Status: DC | PRN

## 2024-02-19 MED ORDER — MAGNESIUM HYDROXIDE 400 MG/5ML PO SUSP: 30.0000 mL | Freq: Every day | ORAL | Status: DC | PRN

## 2024-02-19 MED ORDER — ESCITALOPRAM OXALATE 5 MG PO TABS: 5.0000 mg | ORAL_TABLET | Freq: Every day | ORAL | Status: DC

## 2024-02-19 MED ORDER — HALOPERIDOL 5 MG PO TABS: 5.0000 mg | ORAL_TABLET | Freq: Three times a day (TID) | ORAL | Status: DC | PRN

## 2024-02-19 MED ORDER — ALUM & MAG HYDROXIDE-SIMETH 200-200-20 MG/5ML PO SUSP: 30.0000 mL | ORAL | Status: DC | PRN

## 2024-02-19 MED ORDER — HYDROXYZINE HCL 25 MG PO TABS: 25.0000 mg | ORAL_TABLET | Freq: Three times a day (TID) | ORAL | Status: DC | PRN

## 2024-02-19 MED ORDER — DIPHENHYDRAMINE HCL 50 MG PO CAPS: 50.0000 mg | ORAL_CAPSULE | Freq: Three times a day (TID) | ORAL | Status: DC | PRN

## 2024-02-19 MED ORDER — ARIPIPRAZOLE 10 MG PO TABS
10.0000 mg | ORAL_TABLET | Freq: Every day | ORAL | Status: DC
  Administered 2024-02-19: 10 mg via ORAL
  Filled 2024-02-19: qty 1

## 2024-02-19 MED ORDER — BICTEGRAVIR-EMTRICITAB-TENOFOV 50-200-25 MG PO TABS
1.0000 | ORAL_TABLET | Freq: Every day | ORAL | Status: DC
  Administered 2024-02-20: 1 via ORAL
  Filled 2024-02-19: qty 1

## 2024-02-19 MED ORDER — ACETAMINOPHEN 325 MG PO TABS: 650.0000 mg | ORAL_TABLET | Freq: Four times a day (QID) | ORAL | Status: DC | PRN

## 2024-02-19 MED ORDER — ARIPIPRAZOLE 10 MG PO TABS
10.0000 mg | ORAL_TABLET | Freq: Every day | ORAL | Status: DC
  Administered 2024-02-20: 10 mg via ORAL
  Filled 2024-02-19: qty 1

## 2024-02-19 MED ORDER — ESCITALOPRAM OXALATE 5 MG PO TABS
5.0000 mg | ORAL_TABLET | Freq: Every day | ORAL | Status: DC
  Administered 2024-02-19: 5 mg via ORAL
  Filled 2024-02-19: qty 1

## 2024-02-19 NOTE — ED Notes (Signed)
 Patient is alert and oriented. He is calm and cooperative. Pt observed resting in his lounger. He denies SI/HI or AVH. He denies physical pain or discomfort. Patient took his medications with no issues. No additional needs noted at this time. We will continue to monitor for safety.

## 2024-02-19 NOTE — ED Notes (Signed)
 Pt in the bed sleeping. NAD. Respirations are even and unlabored. Will continue to monitor for safety.

## 2024-02-19 NOTE — ED Notes (Signed)
 Patient discharged to [facility]. Escorted by staff.

## 2024-02-19 NOTE — ED Notes (Signed)
 Pt is currently sleeping, no distress noted, environmental check complete, will continue to monitor patient for safety.

## 2024-02-19 NOTE — ED Provider Notes (Signed)
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   Albumin 36/64/4034 3.6  3.5 - 5.0 g/dL Final   AST 74/25/9563 36  15 - 41 U/L Final   ALT 11/26/2023 25  0 - 44 U/L Final   Alkaline Phosphatase 11/26/2023 51  38 - 126 U/L Final   Total Bilirubin 11/26/2023 0.5  <1.2 mg/dL Final   GFR, Estimated 11/26/2023 >60  >60 mL/min Final   Comment: (NOTE) Calculated using the CKD-EPI Creatinine Equation (2021)    Anion gap 11/26/2023 4 (L)  5 - 15 Final   Performed at [facility], [address] [place], [place] [address]   Alcohol,  Ethyl (B) 11/26/2023 <10  <10 mg/dL Final   Comment: (NOTE) Lowest detectable limit for serum alcohol is 10 mg/dL.  For medical purposes only. Performed at [facility], [address] [place], [place] [address]    POC Amphetamine UR 11/26/2023 None Detected  NONE DETECTED (Cut Off Level 1000 ng/mL) Final   POC Secobarbital (BAR) 11/26/2023 None Detected  NONE DETECTED (Cut Off Level 300 ng/mL) Final   POC Buprenorphine (BUP) 11/26/2023 None Detected  NONE DETECTED (Cut Off Level 10 ng/mL) Final   POC Oxazepam (BZO) 11/26/2023 None Detected  NONE DETECTED (Cut Off Level 300 ng/mL) Final   POC Cocaine UR 11/26/2023 None Detected  NONE DETECTED (Cut Off Level 300 ng/mL) Final   POC Methamphetamine UR 11/26/2023 None Detected  NONE DETECTED (Cut Off Level 1000 ng/mL) Final   POC Morphine 11/26/2023 None Detected  NONE DETECTED (Cut Off Level 300 ng/mL) Final   POC Methadone UR 11/26/2023 None Detected  NONE DETECTED (Cut Off Level 300 ng/mL) Final   POC Oxycodone UR 11/26/2023 None Detected  NONE DETECTED (Cut Off Level 100 ng/mL) Final   POC Marijuana UR 11/26/2023 None Detected  NONE DETECTED (Cut Off Level 50 ng/mL) Final   Cholesterol 11/26/2023 148  0 - 200 mg/dL Final   Triglycerides 18/84/1660 65  <150 mg/dL Final   HDL 63/12/6008 43  >40 mg/dL Final   Total CHOL/HDL Ratio 11/26/2023 3.4  RATIO Final   VLDL 11/26/2023 13  0 - 40 mg/dL Final   LDL Cholesterol 11/26/2023 92  0 - 99 mg/dL Final   Comment:        Total Cholesterol/HDL:CHD Risk Coronary Heart Disease Risk Table                     Men   Women  1/2 Average Risk   3.4   3.3  Average Risk       5.0   4.4  2 X Average Risk   9.6   7.1  3 X Average Risk  23.4   11.0        Use the calculated Patient Ratio above and the CHD Risk Table to determine the patient's CHD Risk.        ATP III CLASSIFICATION (LDL):  <100     mg/dL   Optimal  932-355  mg/dL   Near or Above                    Optimal  130-159  mg/dL    Borderline  732-202  mg/dL   High  >542  mg/dL   Very High Performed at [facility], [address] [place], [place] [address]   Admission on 10/23/2023, Discharged on 10/23/2023  Component Date Value Ref Range Status   Sodium 10/23/2023 134 (L)  135 - 145 mmol/L Final   Potassium 10/23/2023 4.4  3.5 - 5.1 mmol/L Final   Chloride 10/23/2023 106  98 - 111 mmol/L Final   CO2 10/23/2023 18 (L)  22 - 32 mmol/L Final   Glucose, Bld 10/23/2023 87  70 - 99 mg/dL Final   Glucose reference range applies only to samples taken after fasting for at least 8 hours.   BUN 10/23/2023 10  6 - 20 mg/dL Final   Creatinine, Ser 10/23/2023 1.06  0.61 - 1.24 mg/dL Final   Calcium 60/45/4098 9.1  8.9 - 10.3 mg/dL Final   Total Protein 11/91/4782 7.6  6.5 - 8.1 g/dL Final   Albumin 95/62/1308 3.9  3.5 - 5.0 g/dL Final   AST 65/78/4696 29  15 - 41 U/L Final   ALT 10/23/2023 16  0 - 44 U/L Final   Alkaline Phosphatase 10/23/2023 51  38 - 126 U/L Final   Total Bilirubin 10/23/2023 0.6  <1.2 mg/dL Final   GFR, Estimated 10/23/2023 >60  >60 mL/min Final   Comment: (NOTE) Calculated using the CKD-EPI Creatinine Equation (2021)    Anion gap 10/23/2023 10  5 - 15 Final   Performed at [facility], [address], [place], [place] [address]   Alcohol, Ethyl (B) 10/23/2023 <10  <10 mg/dL Final   Comment: (NOTE) Lowest detectable limit for serum alcohol is 10 mg/dL.  For medical purposes only. Performed at [facility], [address], [place], [place] [address]    Opiates 10/23/2023 NONE DETECTED  NONE DETECTED Final   Cocaine 10/23/2023 NONE DETECTED  NONE DETECTED Final   Benzodiazepines 10/23/2023 NONE DETECTED  NONE DETECTED Final   Amphetamines 10/23/2023 NONE DETECTED  NONE DETECTED Final   Tetrahydrocannabinol 10/23/2023 NONE DETECTED  NONE DETECTED Final   Barbiturates 10/23/2023 NONE DETECTED  NONE DETECTED Final   Comment: (NOTE) DRUG SCREEN FOR MEDICAL PURPOSES ONLY.  IF  CONFIRMATION IS NEEDED FOR ANY PURPOSE, NOTIFY LAB WITHIN 5 DAYS.  LOWEST DETECTABLE LIMITS FOR URINE DRUG SCREEN Drug Class                     Cutoff (ng/mL) Amphetamine and metabolites    1000 Barbiturate and metabolites    200 Benzodiazepine                 200 Opiates and metabolites        300 Cocaine and metabolites        300 THC                            50 Performed at [facility], [address] [place], [place] [address]    WBC 10/23/2023 4.8  4.0 - 10.5 K/uL Final   RBC 10/23/2023 4.96  4.22 - 5.81 MIL/uL Final   Hemoglobin 10/23/2023 15.2  13.0 - 17.0 g/dL Final   HCT 72/53/6644 46.2  39.0 - 52.0 % Final   MCV 10/23/2023 93.1  80.0 - 100.0 fL Final   MCH 10/23/2023 30.6  26.0 - 34.0 pg Final   MCHC 10/23/2023 32.9  30.0 - 36.0 g/dL Final   RDW 03/47/4259 14.4  11.5 - 15.5 %  Final   Platelets 10/23/2023 225  150 - 400 K/uL Final   nRBC 10/23/2023 0.0  0.0 - 0.2 % Final   Neutrophils Relative % 10/23/2023 40  % Final   Neutro Abs 10/23/2023 1.9  1.7 - 7.7 K/uL Final   Lymphocytes Relative 10/23/2023 44  % Final   Lymphs Abs 10/23/2023 2.2  0.7 - 4.0 K/uL Final   Monocytes Relative 10/23/2023 9  % Final   Monocytes Absolute 10/23/2023 0.4  0.1 - 1.0 K/uL Final   Eosinophils Relative 10/23/2023 6  % Final   Eosinophils Absolute 10/23/2023 0.3  0.0 - 0.5 K/uL Final   Basophils Relative 10/23/2023 1  % Final   Basophils Absolute 10/23/2023 0.0  0.0 - 0.1 K/uL Final   Immature Granulocytes 10/23/2023 0  % Final   Abs Immature Granulocytes 10/23/2023 0.01  0.00 - 0.07 K/uL Final   Performed at [facility], [address] [place], [place] [address]   Total lymphocyte count 10/23/2023 2,194  1,000 - 4,000 /uL Final   CD4% 10/23/2023 26.20 (L)  33 - 65 % Final   CD4 absolute 10/23/2023 575  400 - 1,790 /uL Final   CD8tox 10/23/2023 34.30  12 - 40 % Final   CD8 T Cell Abs 10/23/2023 753  190 - 1,000 /uL Final   Ratio 10/23/2023 0.76 (L)  1.0 - 3.0 Final    Performed at [facility], [address] [place], [place] [address]    Allergies: Patient has no known allergies.  Medications:  Facility Ordered Medications  Medication   acetaminophen (TYLENOL) tablet 650 mg   alum & mag hydroxide-simeth (MAALOX/MYLANTA) 200-200-20 MG/5ML suspension 30 mL   magnesium hydroxide (MILK OF MAGNESIA) suspension 30 mL   haloperidol (HALDOL) tablet 5 mg   And   diphenhydrAMINE (BENADRYL) capsule 50 mg   haloperidol lactate (HALDOL) injection 5 mg   And   diphenhydrAMINE (BENADRYL) injection 50 mg   And   LORazepam (ATIVAN) injection 2 mg   haloperidol lactate (HALDOL) injection 10 mg   And   diphenhydrAMINE (BENADRYL) injection 50 mg   And   LORazepam (ATIVAN) injection 2 mg   hydrOXYzine (ATARAX) tablet 25 mg   traZODone (DESYREL) tablet 50 mg   PTA Medications  Medication Sig   ARIPiprazole ER (ABILIFY MAINTENA) 400 MG SRER injection Inject 1.5 mLs (300 mg total) into the muscle every 28 (twenty-eight) days.   traZODone (DESYREL) 50 MG tablet Take 0.5 tablets (25 mg total) by mouth at bedtime as needed and may repeat dose one time if needed for sleep. (Patient not taking: Reported on 01/17/2024)   cloNIDine HCl (KAPVAY) 0.1 MG TB12 ER tablet Take 1 tablet (0.1 mg total) by mouth in the morning.      Medical Decision Making  Patient is endorsing SI with plan to overdose. Patient will be admitted to [facility] for continuous assessment and safety.   -resume home medication as appropriate:     Recommendations  Based on my evaluation the patient does not appear to have an emergency medical condition.  Maricela Bo, NP 02/19/24  5:30 AM
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
## 2024-02-19 NOTE — ED Notes (Signed)
 Patient transferred from [facility] to [facility] due to need for stabilization from schizophrenia. Patient currently denies SI/HI and AVH. Calm, cooperative throughout interview process. Skin assessment completed. Oriented to unit. Meal and drink offered. At currrent, patient continue to deny SI/HI/AVH. Patient verbally contract for safety. Safety checks in place according to facility policy. No complaints from patient at this time.

## 2024-02-19 NOTE — ED Notes (Signed)
 Pt is in the bedroom calm and composed. Denies SI/HI/AVH.  NAD. Comfortable in bed. Will monitor for safety.

## 2024-02-19 NOTE — Discharge Instructions (Signed)
 Readmit to [facility]

## 2024-02-19 NOTE — ED Notes (Signed)
Patient observed resting quietly, eyes closed. Respirations equal and unlabored. Will continue to monitor for safety.  

## 2024-02-20 MED ORDER — CLONIDINE HCL 0.1 MG PO TABS
0.1000 mg | ORAL_TABLET | Freq: Every day | ORAL | Status: DC
  Administered 2024-02-20: 0.1 mg via ORAL
  Filled 2024-02-20: qty 1

## 2024-02-20 MED ORDER — NAPHAZOLINE-PHENIRAMINE 0.025-0.3 % OP SOLN
2.0000 [drp] | Freq: Four times a day (QID) | OPHTHALMIC | Status: DC | PRN
Start: 2024-02-20 — End: 2024-02-21

## 2024-02-20 MED ORDER — BENZTROPINE MESYLATE 1 MG PO TABS: 1.0000 mg | ORAL_TABLET | Freq: Every day | ORAL | Status: DC

## 2024-02-20 MED ORDER — ESCITALOPRAM OXALATE 5 MG PO TABS
5.0000 mg | ORAL_TABLET | Freq: Every day | ORAL | Status: AC
  Administered 2024-02-20: 5 mg via ORAL
  Filled 2024-02-20: qty 1

## 2024-02-20 MED ORDER — ESCITALOPRAM OXALATE 10 MG PO TABS: 10.0000 mg | ORAL_TABLET | Freq: Every day | ORAL | Status: DC

## 2024-02-20 NOTE — ED Notes (Signed)
 Pt in the bed sleeping. NAD. Respirations are even and unlabored. Will continue to monitor for safety.
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
## 2024-02-20 NOTE — Group Note (Signed)
 Group Topic: Wellness  Group Date: 02/20/2024 Start Time: 1645 End Time: 1715 Facilitators: Cassandria Anger  Department: [facility]  Number of Participants: 6  Group Focus: relaxation Treatment Modality:  Psychoeducation Interventions utilized were patient education Purpose: increase insight  Name: Kenneth Welch. Date of Birth: 1986-11-28  MR: [id number]    Level of Participation: moderate Quality of Participation: attentive, cooperative, and engaged Interactions with others: gave feedback Mood/Affect: appropriate and positive Triggers (if applicable): N/A Cognition: coherent/clear, concrete, and insightful Progress: Gaining insight Response: Patient was asked to meditate and release all negative thoughts and energy from mind. Plan: patient will be encouraged to continue to attend group  Patients Problems:  Patient Active Problem List   Diagnosis Date Noted   Schizoaffective disorder, bipolar type (HCC) 02/19/2024   IVDU (intravenous drug user) 01/28/2024   Arm abscess 01/19/2024   Cellulitis 01/17/2024   PTSD (post-traumatic stress disorder) 11/29/2023   Methamphetamine use disorder, severe (HCC) 11/29/2023   Cannabis use disorder 11/29/2023   Long term current use of antipsychotic medication 11/29/2023   Schizoaffective disorder, depressive type (HCC) 11/27/2023   Homelessness 06/13/2023   Suicidal ideation 06/13/2023   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

## 2024-02-20 NOTE — ED Notes (Signed)
 Pt sleeping in no acute distress. RR even and unlabored. Environment secured. Will continue to monitor for safety.

## 2024-02-20 NOTE — ED Notes (Signed)
 Pt is in his room resting in bed. Pt denies SI/HI/AVH. Pt has no further complains at this moment. No acute distress noted. Will continue to monitor for safety.

## 2024-02-20 NOTE — ED Notes (Signed)
 Patient was provided lunch

## 2024-02-20 NOTE — Group Note (Signed)
 Group Topic: Communication  Group Date: 02/19/2024 Start Time: 2000 End Time: 2015 Facilitators: Lauro Juliona Vales, [place]  Department: [facility]  Number of Participants: 5 Group Focus: clarity of thought and communication Treatment Modality:  Cognitive Behavioral Therapy Interventions utilized were clarification Purpose: express feelings and improve communication skills  Name: Kenneth Welch. Date of Birth: [birth year]  MR: [id number]    Level of Participation: PT DID NOT ATTEND GROUP Quality of Participation: attentive and cooperative Interactions with others: gave feedback Mood/Affect: appropriate Triggers (if applicable): N/A Cognition: coherent/clear Progress: Gaining insight Response: N/A Plan: patient will be encouraged to attend group sessions.  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

## 2024-02-20 NOTE — Group Note (Signed)
 Group Topic: Relaxation  Group Date: 02/20/2024 Start Time: 1645 End Time: 1715 Facilitators: Prentice Docker, RN  Department: [facility]  Number of Participants: 7  Group Focus: coping skills Treatment Modality:  Skills Training Interventions utilized were group exercise Purpose: enhance coping skills  Name: Kenneth Welch. Date of Birth: [birth year]  MR: [id number]    Level of Participation: active Quality of Participation: cooperative Interactions with others: engaged Mood/Affect: appropriate Triggers (if applicable): none identified Cognition: coherent/clear Progress: Minimal Response: "I'll try it to see if it help me with my depression" Plan: patient will be encouraged to utilize learned coping skill when feeling depressed"  Patients Problems:  Patient Active Problem List   Diagnosis Date Noted   Schizoaffective disorder, bipolar type (HCC) 02/19/2024   IVDU (intravenous drug user) 01/28/2024   Arm abscess 01/19/2024   Cellulitis 01/17/2024   PTSD (post-traumatic stress disorder) 11/29/2023   Methamphetamine use disorder, severe (HCC) 11/29/2023   Cannabis use disorder 11/29/2023   Long term current use of antipsychotic medication 11/29/2023   Schizoaffective disorder, depressive type (HCC) 11/27/2023   Homelessness 06/13/2023   Suicidal ideation 06/13/2023   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

## 2024-02-20 NOTE — ED Provider Notes (Signed)
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 DETECTED (Cut Off Level 300 ng/mL) Final   POC Cocaine UR 11/26/2023 None Detected  NONE DETECTED (Cut Off Level 300 ng/mL) Final   POC Methamphetamine UR 11/26/2023 None Detected  NONE DETECTED (Cut Off  Level 1000 ng/mL) Final   POC Morphine 11/26/2023 None Detected  NONE DETECTED (Cut Off Level 300 ng/mL) Final   POC Methadone UR 11/26/2023 None Detected  NONE DETECTED (Cut Off Level 300 ng/mL) Final    POC Oxycodone UR 11/26/2023 None Detected  NONE DETECTED (Cut Off Level 100 ng/mL) Final   POC Marijuana UR 11/26/2023 None Detected  NONE DETECTED (Cut Off Level 50 ng/mL) Final   Cholesterol 11/26/2023 148  0 - 200 mg/dL Final   Triglycerides 40/98/1191 65  <150 mg/dL Final   HDL 47/82/9562 43  >40 mg/dL Final   Total CHOL/HDL Ratio 11/26/2023 3.4  RATIO Final   VLDL 11/26/2023 13  0 - 40 mg/dL Final   LDL Cholesterol 11/26/2023 92  0 - 99 mg/dL Final   Comment:        Total Cholesterol/HDL:CHD Risk Coronary Heart Disease Risk Table                     Men   Women  1/2 Average Risk   3.4   3.3  Average Risk       5.0   4.4  2 X Average Risk   9.6   7.1  3 X Average Risk  23.4   11.0        Use the calculated Patient Ratio above and the CHD Risk Table to determine the patient's CHD Risk.        ATP III CLASSIFICATION (LDL):  <100     mg/dL   Optimal  130-865  mg/dL   Near or Above                    Optimal  130-159  mg/dL   Borderline  784-696  mg/dL   High  >295     mg/dL   Very High Performed at [facility], [address] [place], [place] [address]   Admission on 10/23/2023, Discharged on 10/23/2023  Component Date Value Ref Range Status   Sodium 10/23/2023 134 (L)  135 - 145 mmol/L Final   Potassium 10/23/2023 4.4  3.5 - 5.1 mmol/L Final   Chloride 10/23/2023 106  98 - 111 mmol/L Final   CO2 10/23/2023 18 (L)  22 - 32 mmol/L Final   Glucose, Bld 10/23/2023 87  70 - 99 mg/dL Final   Glucose reference range applies only to samples taken after fasting for at least 8 hours.   BUN 10/23/2023 10  6 - 20 mg/dL Final   Creatinine, Ser 10/23/2023 1.06  0.61 - 1.24 mg/dL Final   Calcium 24/40/1027 9.1  8.9 - 10.3 mg/dL Final   Total Protein 25/36/6440 7.6  6.5 - 8.1 g/dL Final   Albumin 34/74/2595 3.9  3.5 - 5.0 g/dL Final   AST 63/87/5643 29  15 - 41 U/L Final   ALT 10/23/2023 16  0 - 44 U/L Final   Alkaline Phosphatase 10/23/2023 51  38 - 126 U/L Final   Total  Bilirubin 10/23/2023 0.6  <1.2 mg/dL Final   GFR, Estimated 10/23/2023 >60  >60 mL/min Final   Comment: (NOTE) Calculated using the CKD-EPI Creatinine Equation (2021)    Anion gap 10/23/2023 10  5 - 15 Final   Performed at [facility], [address], [place], [place] [address]   Alcohol, Ethyl (B) 10/23/2023 <10  <10 mg/dL Final   Comment: (NOTE) Lowest detectable limit for serum alcohol is 10 mg/dL.  For medical purposes  only. Performed at [facility], [address], [place], [place] [address]    Opiates 10/23/2023 NONE DETECTED  NONE DETECTED Final   Cocaine 10/23/2023 NONE DETECTED  NONE DETECTED Final   Benzodiazepines 10/23/2023 NONE DETECTED  NONE DETECTED Final   Amphetamines 10/23/2023 NONE DETECTED  NONE DETECTED Final   Tetrahydrocannabinol 10/23/2023 NONE DETECTED  NONE DETECTED Final   Barbiturates 10/23/2023 NONE DETECTED  NONE DETECTED Final   Comment: (NOTE) DRUG SCREEN FOR MEDICAL PURPOSES ONLY.  IF CONFIRMATION IS NEEDED FOR ANY PURPOSE, NOTIFY LAB WITHIN 5 DAYS.  LOWEST DETECTABLE LIMITS FOR URINE DRUG SCREEN Drug Class                     Cutoff (ng/mL) Amphetamine and metabolites    1000 Barbiturate and metabolites    200 Benzodiazepine                 200 Opiates and metabolites        300 Cocaine and metabolites        300 THC                            50 Performed at [facility], [address], [place], [place] [address]    WBC 10/23/2023 4.8  4.0 - 10.5 K/uL Final   RBC 10/23/2023 4.96  4.22 - 5.81 MIL/uL Final   Hemoglobin 10/23/2023 15.2  13.0 - 17.0 g/dL Final   HCT 14/78/2956 46.2  39.0 - 52.0 % Final   MCV 10/23/2023 93.1  80.0 - 100.0 fL Final   MCH 10/23/2023 30.6  26.0 - 34.0 pg Final   MCHC 10/23/2023 32.9  30.0 - 36.0 g/dL Final   RDW 21/30/8657 14.4  11.5 - 15.5 % Final   Platelets 10/23/2023 225  150 - 400 K/uL Final   nRBC 10/23/2023 0.0  0.0 - 0.2 % Final   Neutrophils Relative % 10/23/2023 40  % Final    Neutro Abs 10/23/2023 1.9  1.7 - 7.7 K/uL Final   Lymphocytes Relative 10/23/2023 44  % Final   Lymphs Abs 10/23/2023 2.2  0.7 - 4.0 K/uL Final   Monocytes Relative 10/23/2023 9  % Final   Monocytes Absolute 10/23/2023 0.4  0.1 - 1.0 K/uL Final   Eosinophils Relative 10/23/2023 6  % Final   Eosinophils Absolute 10/23/2023 0.3  0.0 - 0.5 K/uL Final   Basophils Relative 10/23/2023 1  % Final   Basophils Absolute 10/23/2023 0.0  0.0 - 0.1 K/uL Final   Immature Granulocytes 10/23/2023 0  % Final   Abs Immature Granulocytes 10/23/2023 0.01  0.00 - 0.07 K/uL Final   Performed at [facility], [address], [place], [place] [address]   Total lymphocyte count 10/23/2023 2,194  1,000 - 4,000 /uL Final   CD4% 10/23/2023 26.20 (L)  33 - 65 % Final   CD4 absolute 10/23/2023 575  400 - 1,790 /uL Final   CD8tox 10/23/2023 34.30  12 - 40 % Final   CD8 T Cell Abs 10/23/2023 753  190 - 1,000 /uL Final   Ratio 10/23/2023 0.76 (L)  1.0 - 3.0 Final   Performed at [facility], [address], [place], [place] [address]    Allergies: Patient has no known allergies.  Medications:  Facility Ordered Medications  Medication   acetaminophen (TYLENOL) tablet 650 mg   alum & mag hydroxide-simeth (MAALOX/MYLANTA)  200-200-20 MG/5ML suspension 30 mL   magnesium hydroxide (MILK OF MAGNESIA) suspension 30 mL   haloperidol (HALDOL) tablet 5 mg   And   diphenhydrAMINE (BENADRYL) capsule 50 mg   hydrOXYzine (ATARAX) tablet 25 mg   traZODone (DESYREL) tablet 50 mg   ARIPiprazole (ABILIFY) tablet 10 mg   bictegravir-emtricitabine-tenofovir AF (BIKTARVY) 50-200-25 MG per tablet 1 tablet   guaiFENesin (MUCINEX) 12 hr tablet 600 mg   loratadine (CLARITIN) tablet 10 mg   fluticasone (FLONASE) 50 MCG/ACT nasal spray 2 spray   naphazoline-pheniramine (NAPHCON-A) 0.025-0.3 % ophthalmic solution 2 drop   cloNIDine (CATAPRES) tablet 0.1 mg   escitalopram (LEXAPRO) tablet 5 mg   Followed by    Melene Muller ON 02/21/2024] escitalopram (LEXAPRO) tablet 10 mg   PTA Medications  Medication Sig   bictegravir-emtricitabine-tenofovir AF (BIKTARVY) 50-200-25 MG TABS tablet Take 1 tablet by mouth daily.   cloNIDine HCl (KAPVAY) 0.1 MG TB12 ER tablet Take 1 tablet (0.1 mg total) by mouth in the morning.   ARIPiprazole (ABILIFY) 10 MG tablet Take 1 tablet (10 mg total) by mouth daily.   escitalopram (LEXAPRO) 5 MG tablet Take 1 tablet (5 mg total) by mouth daily.    Long Term Goals: Improvement in symptoms so as ready for discharge  Short Term Goals: Patient will verbalize feelings in meetings with treatment team members., Patient will attend at least of 50% of the groups daily., Pt will complete the PHQ9 on admission, day 3 and discharge., Patient will participate in completing the [place] Suicide Severity Rating Scale, Patient will score a low risk of violence for 24 hours prior to discharge, and Patient will take medications as prescribed daily.  Medical Decision Making  Status: Voluntary   Psychiatric Diagnoses and Treatment:  Substance induced mood disorder vs primary mood disorder Change Lexapro 5 mg, changed to evening dose since patient is reporting sedation Restart abilify 5 mg daily Start clonidine 0.1 mg nightly for anxiety, patient reports e has responded well to this medication historically. Vitals will be monitored closely   Medical Issues Being Addressed:  VSS  HIV Continue home biktarvy    Other PRNs: acetaminophen, 650 mg, Q6H PRN alum & mag hydroxide-simeth, 30 mL, Q4H PRN haloperidol, 5 mg, TID PRN  And diphenhydrAMINE, 50 mg, TID PRN fluticasone, 2 spray, Daily PRN guaiFENesin, 600 mg, BID PRN hydrOXYzine, 25 mg, TID PRN loratadine, 10 mg, Daily PRN magnesium hydroxide, 30 mL, Daily PRN naphazoline-pheniramine, 2 drop, QID PRN traZODone, 50 mg, QHS PRN   Other Labs/Imaging Reviewed: CBC unremarkable CMP showing hyperglycemia 141 A1C 5.6% Ethanol  <10 Lipid panel is unremarkable TSH WNL   EKG on 02/18/2024: QTc 399   Disposition: to be determined     Recommendations  Based on my evaluation the patient does not appear to have an emergency medical condition.  Lorri Frederick, MD 02/20/24  1:17 PM

## 2024-02-20 NOTE — ED Notes (Signed)
 Patient was provided dinner

## 2024-02-20 NOTE — Care Management (Signed)
 [facility] Care Management   Writer referred patient to [facility].

## 2024-02-21 ENCOUNTER — Encounter (HOSPITAL_COMMUNITY): Payer: Self-pay | Admitting: Emergency Medicine

## 2024-02-21 ENCOUNTER — Other Ambulatory Visit (HOSPITAL_COMMUNITY)
Admission: EM | Admit: 2024-02-21 | Discharge: 2024-02-21 | Disposition: A | Discharge status: Home / Self Care | Payer: MEDICAID | Source: Home / Self Care | Attending: Psychiatry | Admitting: Psychiatry

## 2024-02-21 ENCOUNTER — Emergency Department (HOSPITAL_COMMUNITY)
Admission: EM | Admit: 2024-02-21 | Discharge: 2024-02-21 | Disposition: A | Discharge status: Other Acute Inpatient Hospital | Payer: MEDICAID | Attending: Emergency Medicine | Admitting: Emergency Medicine

## 2024-02-21 ENCOUNTER — Other Ambulatory Visit (HOSPITAL_COMMUNITY)
Admission: EM | Admit: 2024-02-21 | Discharge: 2024-02-26 | Disposition: A | Discharge status: Other Acute Inpatient Hospital | Payer: MEDICAID | Attending: Psychiatry | Admitting: Psychiatry

## 2024-02-21 DIAGNOSIS — Z91148 Patient's other noncompliance with medication regimen for other reason: Secondary | ICD-10-CM | POA: Insufficient documentation

## 2024-02-21 DIAGNOSIS — F259 Schizoaffective disorder, unspecified: Secondary | ICD-10-CM | POA: Insufficient documentation

## 2024-02-21 DIAGNOSIS — F1510 Other stimulant abuse, uncomplicated: Secondary | ICD-10-CM

## 2024-02-21 DIAGNOSIS — R55 Syncope and collapse: Secondary | ICD-10-CM

## 2024-02-21 DIAGNOSIS — J45909 Unspecified asthma, uncomplicated: Secondary | ICD-10-CM | POA: Insufficient documentation

## 2024-02-21 DIAGNOSIS — F331 Major depressive disorder, recurrent, moderate: Secondary | ICD-10-CM | POA: Insufficient documentation

## 2024-02-21 DIAGNOSIS — Z21 Asymptomatic human immunodeficiency virus [HIV] infection status: Secondary | ICD-10-CM | POA: Insufficient documentation

## 2024-02-21 DIAGNOSIS — F1990 Other psychoactive substance use, unspecified, uncomplicated: Secondary | ICD-10-CM

## 2024-02-21 DIAGNOSIS — F1994 Other psychoactive substance use, unspecified with psychoactive substance-induced mood disorder: Secondary | ICD-10-CM

## 2024-02-21 DIAGNOSIS — F419 Anxiety disorder, unspecified: Secondary | ICD-10-CM | POA: Insufficient documentation

## 2024-02-21 DIAGNOSIS — R7303 Prediabetes: Secondary | ICD-10-CM

## 2024-02-21 DIAGNOSIS — F1514 Other stimulant abuse with stimulant-induced mood disorder: Secondary | ICD-10-CM | POA: Insufficient documentation

## 2024-02-21 DIAGNOSIS — F1520 Other stimulant dependence, uncomplicated: Secondary | ICD-10-CM | POA: Diagnosis present

## 2024-02-21 DIAGNOSIS — E860 Dehydration: Secondary | ICD-10-CM | POA: Insufficient documentation

## 2024-02-21 DIAGNOSIS — Z79899 Other long term (current) drug therapy: Secondary | ICD-10-CM | POA: Insufficient documentation

## 2024-02-21 DIAGNOSIS — R45851 Suicidal ideations: Secondary | ICD-10-CM | POA: Insufficient documentation

## 2024-02-21 DIAGNOSIS — F1590 Other stimulant use, unspecified, uncomplicated: Secondary | ICD-10-CM

## 2024-02-21 LAB — GLUCOSE, CAPILLARY: Glucose-Capillary: 90 mg/dL (ref 70–99)

## 2024-02-21 MED ORDER — LORAZEPAM 2 MG/ML IJ SOLN: 2.0000 mg | Freq: Three times a day (TID) | INTRAMUSCULAR | Status: DC | PRN

## 2024-02-21 MED ORDER — ESCITALOPRAM OXALATE 10 MG PO TABS: 10.0000 mg | ORAL_TABLET | Freq: Every day | ORAL | Status: DC

## 2024-02-21 MED ORDER — DIPHENHYDRAMINE HCL 25 MG PO CAPS: 25.0000 mg | ORAL_CAPSULE | Freq: Four times a day (QID) | ORAL | Status: DC | PRN

## 2024-02-21 MED ORDER — HALOPERIDOL LACTATE 5 MG/ML IJ SOLN: 10.0000 mg | Freq: Three times a day (TID) | INTRAMUSCULAR | Status: DC | PRN

## 2024-02-21 MED ORDER — SODIUM CHLORIDE 0.9 % IV BOLUS
1000.0000 mL | Freq: Once | INTRAVENOUS | Status: AC
  Administered 2024-02-21: 1000 mL via INTRAVENOUS

## 2024-02-21 MED ORDER — HALOPERIDOL 5 MG PO TABS: 5.0000 mg | ORAL_TABLET | Freq: Three times a day (TID) | ORAL | Status: DC | PRN

## 2024-02-21 MED ORDER — ARIPIPRAZOLE 10 MG PO TABS
10.0000 mg | ORAL_TABLET | Freq: Every day | ORAL | Status: DC
  Administered 2024-02-21 – 2024-02-25 (×5): 10 mg via ORAL
  Filled 2024-02-21 (×6): qty 1
  Filled 2024-02-21 (×2): qty 7

## 2024-02-21 MED ORDER — FLUTICASONE PROPIONATE 50 MCG/ACT NA SUSP: 2.0000 | Freq: Every day | NASAL | Status: DC | PRN

## 2024-02-21 MED ORDER — LORAZEPAM 2 MG/ML IJ SOLN
2.0000 mg | Freq: Three times a day (TID) | INTRAMUSCULAR | Status: DC | PRN
Start: 2024-02-21 — End: 2024-02-21

## 2024-02-21 MED ORDER — ALUM & MAG HYDROXIDE-SIMETH 200-200-20 MG/5ML PO SUSP
30.0000 mL | ORAL | Status: DC | PRN
Start: 2024-02-21 — End: 2024-02-21

## 2024-02-21 MED ORDER — ACETAMINOPHEN 325 MG PO TABS: 650.0000 mg | ORAL_TABLET | Freq: Four times a day (QID) | ORAL | Status: DC | PRN

## 2024-02-21 MED ORDER — HYDROXYZINE HCL 25 MG PO TABS
25.0000 mg | ORAL_TABLET | Freq: Three times a day (TID) | ORAL | Status: DC | PRN
  Administered 2024-02-24: 25 mg via ORAL
  Filled 2024-02-21 (×2): qty 1
  Filled 2024-02-21: qty 10

## 2024-02-21 MED ORDER — GUAIFENESIN ER 600 MG PO TB12: 600.0000 mg | ORAL_TABLET | Freq: Two times a day (BID) | ORAL | Status: DC | PRN

## 2024-02-21 MED ORDER — MAGNESIUM HYDROXIDE 400 MG/5ML PO SUSP: 30.0000 mL | Freq: Every day | ORAL | Status: DC | PRN

## 2024-02-21 MED ORDER — TRAZODONE HCL 50 MG PO TABS: 50.0000 mg | ORAL_TABLET | Freq: Every evening | ORAL | Status: DC | PRN

## 2024-02-21 MED ORDER — DIPHENHYDRAMINE HCL 50 MG/ML IJ SOLN: 50.0000 mg | Freq: Three times a day (TID) | INTRAMUSCULAR | Status: DC | PRN

## 2024-02-21 MED ORDER — NAPHAZOLINE-PHENIRAMINE 0.025-0.3 % OP SOLN: 2.0000 [drp] | Freq: Four times a day (QID) | OPHTHALMIC | Status: DC | PRN

## 2024-02-21 MED ORDER — HYDROXYZINE HCL 25 MG PO TABS: 25.0000 mg | ORAL_TABLET | Freq: Three times a day (TID) | ORAL | Status: DC | PRN

## 2024-02-21 MED ORDER — HALOPERIDOL LACTATE 5 MG/ML IJ SOLN: 5.0000 mg | Freq: Three times a day (TID) | INTRAMUSCULAR | Status: DC | PRN

## 2024-02-21 MED ORDER — ESCITALOPRAM OXALATE 10 MG PO TABS
10.0000 mg | ORAL_TABLET | Freq: Every day | ORAL | Status: DC
  Filled 2024-02-21: qty 1

## 2024-02-21 MED ORDER — BICTEGRAVIR-EMTRICITAB-TENOFOV 50-200-25 MG PO TABS
1.0000 | ORAL_TABLET | Freq: Every day | ORAL | Status: DC
  Administered 2024-02-21 – 2024-02-25 (×5): 1 via ORAL
  Filled 2024-02-21: qty 7
  Filled 2024-02-21 (×2): qty 1
  Filled 2024-02-21: qty 7
  Filled 2024-02-21 (×4): qty 1

## 2024-02-21 MED ORDER — ARIPIPRAZOLE 10 MG PO TABS: 10.0000 mg | ORAL_TABLET | Freq: Every day | ORAL | Status: DC

## 2024-02-21 MED ORDER — DIPHENHYDRAMINE HCL 50 MG PO CAPS
50.0000 mg | ORAL_CAPSULE | Freq: Three times a day (TID) | ORAL | Status: DC | PRN
Start: 2024-02-21 — End: 2024-02-21

## 2024-02-21 MED ORDER — TRAZODONE HCL 50 MG PO TABS
50.0000 mg | ORAL_TABLET | Freq: Every evening | ORAL | Status: DC | PRN
  Administered 2024-02-24: 50 mg via ORAL
  Filled 2024-02-21 (×2): qty 1
  Filled 2024-02-21 (×2): qty 7

## 2024-02-21 MED ORDER — ALUM & MAG HYDROXIDE-SIMETH 200-200-20 MG/5ML PO SUSP: 30.0000 mL | ORAL | Status: DC | PRN

## 2024-02-21 MED ORDER — BIKTARVY 50-200-25 MG PO TABS
1.0000 | ORAL_TABLET | Freq: Every day | ORAL | Status: DC
Start: 2024-02-21 — End: 2024-02-21

## 2024-02-21 MED ORDER — BICTEGRAVIR-EMTRICITAB-TENOFOV 50-200-25 MG PO TABS: 1.0000 | ORAL_TABLET | Freq: Every day | ORAL | Status: DC

## 2024-02-21 MED ORDER — LORATADINE 10 MG PO TABS: 10.0000 mg | ORAL_TABLET | Freq: Every day | ORAL | Status: DC | PRN

## 2024-02-21 MED ORDER — LORATADINE 10 MG PO TABS
10.0000 mg | ORAL_TABLET | Freq: Every day | ORAL | Status: DC | PRN
  Filled 2024-02-21: qty 7

## 2024-02-21 MED ORDER — DIPHENHYDRAMINE HCL 50 MG PO CAPS: 50.0000 mg | ORAL_CAPSULE | Freq: Three times a day (TID) | ORAL | Status: DC | PRN

## 2024-02-21 MED ORDER — SODIUM CHLORIDE 0.9 % IV BOLUS
1000.0000 mL | Freq: Once | INTRAVENOUS | Status: AC
Start: 2024-02-21 — End: 2024-02-21
  Administered 2024-02-21: 1000 mL via INTRAVENOUS

## 2024-02-21 NOTE — ED Provider Notes (Addendum)
 Kenneth Welch. Is a 38 y/o male with a history of schizophrenia/schizoaffective disorder, post-traumatic stress disorder (PTSD), and methamphetamine abuse. Patient presented voluntarily to the [facility] on 02/19/2024 with complaint of suicidal ideations and was transferred to [facility] on the same day for crisis stabilization and detox. PMHx is significant for HIV, compliant on biktarvy. UDS on admission is positive for methamphetamine.   Was notified by RN that patient had collapsed in the hallway of the unit while attempting to get morning vitals.  I went to see the patient, who was laying on the floor, but was alert and oriented x4.  Patient reports he has not been keeping up with his fluid intake and when he shot out of bed that morning to get vitals he felt weak and his vision blurred.  He denied chest pain, shortness of breath, or palpitations. He denied hitting his head on the floor.  Patient reported improvement of symptoms when placed in a modified trendelenburg position. Patient was later able to ambulate safely to his bed with assistance.   Contacted [facility] provider, Dr. Anitra Lauth, who agreed to accept patient. Once medically cleared, patient can return to [facility] and complete his detox and transition to a residential rehabilitation program.   Objective: Blood pressure 104/60, pulse 66, temperature 97.8 F (36.6 C), temperature source Temporal, resp. rate 16, SpO2 94%.  CBG: 90 Physical Exam Vitals and nursing note reviewed.  Constitutional:      General: He is not in acute distress. HENT:     Head: Normocephalic and atraumatic.  Cardiovascular:     Pulses: Normal pulses.  Pulmonary:     Effort: Pulmonary effort is normal. No respiratory distress.  Musculoskeletal:        General: Normal range of motion.  Skin:    Comments: Numerous excoriations at the forearms bilaterally  Neurological:     General: No focal deficit present.     Mental Status: He is alert and oriented to person,  place, and time.     Cranial Nerves: No cranial nerve deficit.     Sensory: No sensory deficit.     Motor: Weakness present.     Gait: Gait is intact.    Assessment: Suspect orthostatic hypotension secondary to dehydration and clonidine, leading to transient cerebral hypoperfusion and near-syncope. Recommend fluid resuscitation and discontinuation of clonidine.  The patient has no seizure hx reported or identified on chart review  Plan: Once medically cleared, patient can return to [facility] and complete his detox and transition to a residential rehabilitation program.   Signed: Dr. Liston Alba, MD PGY-2, Psychiatry Residency

## 2024-02-21 NOTE — ED Notes (Signed)
   02/21/24 0740  What Happened  Was fall witnessed? No  Was patient injured? No  Patient found on floor;in hallway  Found by Staff-comment Philippa Chester RN)  Stated prior activity ambulating-unassisted  Provider Notification  Provider Name/Title Lorri Frederick  Date Provider Notified 02/21/24  Time Provider Notified 0740  Method of Notification Face-to-face  Notification Reason Fall  Provider response At bedside  Date of Provider Response 02/21/24  Time of Provider Response 0740  Follow Up  Additional tests Yes-comment (V/S, CBG, stroke assessment, cognition assessment)  Blank note created Yes  Sharlett Iles Fall Risk Assessment  Kinder Fall Risk Assessment Presented to ED because of a fall  Fall Risk Score High fall risk  Adult Fall Risk Interventions  Required Bundle Interventions *See Row Information* High risk bundle implemented  Screening for Fall Injury Risk (To be completed on HIGH fall risk patients) - Assessing Need for Floor Mats  Risk For Fall Injury- Criteria for Floor Mats None identified - No additional interventions needed  Vitals  Temp 97.8 F (36.6 C)  Temp Source Temporal  BP 104/60  BP Location Right Arm  BP Method Manual  Patient Position (if appropriate) Lying  Pulse Rate 66  Pulse Rate Source Other (Comment) (personal pulse ox)  Resp 16  Oxygen Therapy  SpO2 94 %  O2 Device Room Air  Patient Activity (if Appropriate) Other (Comment) (lying in floor)  Pain Assessment  Pain Scale 0-10  Pain Score 0  Multiple Pain Sites No  Neurological  Neuro (WDL) WDL  Level of Consciousness Alert  Glasgow Coma Scale  Eye Opening 4  Best Verbal Response (NON-intubated) 5  Best Motor Response 6  Glasgow Coma Scale Score 15  Musculoskeletal  Musculoskeletal (WDL) WDL  Integumentary  Integumentary (WDL) X (old scabs to arms bilat - present upon previous assessment)  Skin Color Appropriate for ethnicity  Skin Condition Dry  Skin Integrity Intact  Skin  Turgor Non-tenting

## 2024-02-21 NOTE — ED Notes (Signed)
 Patient transferred from [facility] to [facility] post medical clearance. Calm, cooperative throughout interview process. Skin assessment completed. Meal and drink offered.Patient alert & oriented x4. Denies intent to harm self or others when asked. Denies A/VH. Patient denies any physical complaints when asked. No acute distress noted. Support and encouragement provided. Routine safety checks conducted per facility protocol. Encouraged patient to notify staff if any thoughts of harm towards self or others arise. Patient verbalizes understanding and agreement.

## 2024-02-21 NOTE — ED Notes (Signed)
 EMS on site to bring patient to [facility]. Belongings given to EMS. Patient stable, transported out on stretcher via EMS and staff. Safety maintained.

## 2024-02-21 NOTE — ED Notes (Addendum)
 Patient found in floor of hallway. States they got up to use the restroom and became dizzy then fell. Patient denies pain, denies hitting anything upon falling. Manual BP obtained of 104/60. Patient states that "everything went dark". Provider Lorri Frederick, MD made aware and arrived at bedside. CBG obtained of 90. Pateitn laid in floor with pillow beneath head and feet elevated. Patient reports position helped with dizziness. Vital signs stable (97.8, 66, 16, 104/60, 94% on room air). Patient assisted by staff to bed. Plan to transfer out for IV fluids. Environment secured, safety checks in place per facility policy.

## 2024-02-21 NOTE — Group Note (Signed)
 Group Topic: Social Support  Group Date: 02/21/2024 Start Time: 1400 End Time: 1500 Facilitators: Taralyn Ferraiolo, Jacklynn Barnacle, RN  Department: [facility]  Number of Participants: 7  Group Focus: community group Treatment Modality:  Interpersonal Therapy Interventions utilized were support Purpose: express feelings  Name: Kenneth Welch. Date of Birth: 03/16/1986  MR: [id number]    Level of Participation: did not attend due to not being on the unit (at [facility] for medical clearance) Quality of Participation:  Interactions with others: Mood/Affect:  Triggers (if applicable):  Cognition:  Progress: None Response:  Plan: patient will be encouraged to attend groups/programming  Patients Problems:  Patient Active Problem List   Diagnosis Date Noted   Schizoaffective disorder, bipolar type (HCC) 02/19/2024   IVDU (intravenous drug user) 01/28/2024   Arm abscess 01/19/2024   Cellulitis 01/17/2024   PTSD (post-traumatic stress disorder) 11/29/2023   Methamphetamine use disorder, severe (HCC) 11/29/2023   Cannabis use disorder 11/29/2023   Long term current use of antipsychotic medication 11/29/2023   Schizoaffective disorder, depressive type (HCC) 11/27/2023   Homelessness 06/13/2023   Suicidal ideation 06/13/2023   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

## 2024-02-21 NOTE — Group Note (Signed)
 Group Topic: Communication  Group Date: 02/20/2024 Start Time: 2000 End Time: 2015 Facilitators: Lauro Tresha Muzio, [place]  Department: [facility]  Number of Participants: 9  Group Focus: check in and clarity of thought Treatment Modality:  Cognitive Behavioral Therapy Interventions utilized were support Purpose: express feelings  Name: Kenneth Welch. Date of Birth: 1986/07/21  MR: [id number]    Level of Participation: pt did not attend group. Quality of Participation: cooperative Interactions with others: gave feedback Mood/Affect: appropriate Triggers (if applicable): N/A Cognition: coherent/clear Progress: Gaining insight Response: N/A Plan: patient will be encouraged to attend group sessions.  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

## 2024-02-21 NOTE — Discharge Instructions (Addendum)
 Your history and evaluation are consistent with dehydration and lightheaded/near syncopal episode.  Your EKG was reassuring and after fluids you felt much better.  Your vital signs were not hypotensive and we feel that you are safe for discharge back.  We had a shared decision-making conversation and agreed to hold on extensive lab work at this time.  Please continue increasing your hydration and continue your outpatient management with the behavioral health urgent care.

## 2024-02-21 NOTE — ED Notes (Signed)
 Patient is sleeping. Respirations equal and unlabored, skin warm and dry. No change in assessment or acuity. Routine safety checks conducted according to facility protocol. Will continue to monitor for safety.

## 2024-02-21 NOTE — ED Provider Notes (Signed)
 Behavioral Health Progress Note  Date and Time: 02/22/2024 3:30 PM Name: Kenneth Welch. MRN:  [id number]  Kenneth Welch. Is a 38 y/o male with a history of schizophrenia/schizoaffective disorder, post-traumatic stress disorder (PTSD), and methamphetamine abuse. Patient presented voluntarily to the [facility] on 02/19/2024 with complaint of suicidal ideations and was transferred to [facility] on the same day for crisis stabilization and detox. PMHx is significant for HIV, compliant on biktarvy. UDS on admission is positive for methamphetamine.   Subjective:   Patient was evaluated in his room.  He is laying in his bed, making poor eye contact with this interviewer.  He admits he continues to struggle with depression, has not found Lexapro helpful.  He acknowledges that he has not given the medication adequate time to work, but request a be discontinued as he is unable to tolerate the sedating effects it has on him.  He is amenable to starting Wellbutrin, risks and benefits of this medication were discussed.  He also admits to experiencing suicidal ideations today, he denies any intent, contracts for safety.  He does however have ideations of overdosing on medications.  Patient continues to be motivated to go to residential rehabilitation.  He denies any homicidal ideations on interview.  He denies hallucinations, paranoid ideations, or delusional thought processes.  Patient notes no significant cravings or withdrawal symptoms on interview.  He has no somatic complaints.  Diagnosis:  Final diagnoses:  Methamphetamine abuse (HCC)  Stimulant use disorder  IVDU (intravenous drug user)  Substance induced mood disorder (HCC)  Moderate episode of recurrent major depressive disorder (HCC)  Suicidal ideations    Total Time spent with patient: 30 minutes  Substance Use Hx: Alcohol: Denies Tobacco: Denies Cannabis: denies current use,reports he has not smoked in weeks Cocaine:  denies Methamphetamines: reports he has been using since 89 initially smoking and later injecting it. He believes  he injects about 2 ccs. He uses twice a week. Route: IV Psilocybin (mushrooms): denies Ecstasy (MDMA / molly): denies LSD (acid): never tried: denies Opiates (fentanyl / heroin): denies Benzos (Xanax, Klonopin): denies IVDU: Yes, see above Rehab hx: Denies   Past Psychiatric Hx: Current Psychiatrist: No Current Therapist: No Previous Psychiatric Diagnoses: Per pt --PTSD, schizoaffective d/o depressive type Current psychiatric medications: Abilify 10 mg (non-compliant, has not had access to these medications) Previous medication trials: prozac (stopped due to excess sedation), clonidine, benztropine (takes it in with abilify), zyprexa (pt describes dystonic reaction) Psychiatric Hospitalization hx: No Psychotherapy hx: reports he had CBT with a therapist when he lived in [place] Neuromodulation history: Denies History of suicide (obtained from HPI):reports 1 prior attempt in July 2024 via OD on combination of prescribed medications History of homicide or aggression (obtained in HPI): reports he was a aggressive as a child, denies any issues in adulthood   Past Medical History: PCP: sees PA at [facility] Dx:HIV Medications:biktarvy Allergies: denies Hospitalizations: Surgeries: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 Mother - liver cancer Father - liver cancer   Family Psychiatric History: Psychiatric YQ:MVHQIO Suicide NG:EXBMWU Substance use:   Social History: Lives in [place] alone, lost his home prior to arriving here.  Reports his parents are both deceased. Patient has 2 sisters, but he is not in contact with them. Social Support: Denies Education: some college Occupational hx: unemployed, on SSI for schizphrenia Marital  Status:Single Children:denies Legal:denies any upcoming court dates, has  prior legal hx  Military: No Access to firearms: No     Current Medications:  Current Facility-Administered Medications  Medication Dose Route Frequency Provider Last Rate Last Admin   acetaminophen (TYLENOL) tablet 650 mg  650 mg Oral Q6H PRN Carrion-Carrero, Waymon Laser, MD       alum & mag hydroxide-simeth (MAALOX/MYLANTA) 200-200-20 MG/5ML suspension 30 mL  30 mL Oral Q4H PRN Carrion-Carrero, Collette Pescador, MD       ARIPiprazole (ABILIFY) tablet 10 mg  10 mg Oral Daily Carrion-Carrero, Herta Hink, MD   10 mg at 02/22/24 1610   bictegravir-emtricitabine-tenofovir AF (BIKTARVY) 50-200-25 MG per tablet 1 tablet  1 tablet Oral Daily Carrion-Carrero, Karle Starch, MD   1 tablet at 02/22/24 9604   buPROPion (WELLBUTRIN XL) 24 hr tablet 150 mg  150 mg Oral Daily Carrion-Carrero, Karle Starch, MD   150 mg at 02/22/24 1358   diphenhydrAMINE (BENADRYL) capsule 25 mg  25 mg Oral Q6H PRN Carrion-Carrero, Tawna Alwin, MD       haloperidol (HALDOL) tablet 5 mg  5 mg Oral TID PRN Carrion-Carrero, Adenike Shidler, MD       And   diphenhydrAMINE (BENADRYL) capsule 50 mg  50 mg Oral TID PRN Carrion-Carrero, Prachi Oftedahl, MD       haloperidol lactate (HALDOL) injection 5 mg  5 mg Intramuscular TID PRN Carrion-Carrero, Amylee Lodato, MD       And   diphenhydrAMINE (BENADRYL) injection 50 mg  50 mg Intramuscular TID PRN Carrion-Carrero, Sylwia Cuervo, MD       And   LORazepam (ATIVAN) injection 2 mg  2 mg Intramuscular TID PRN Carrion-Carrero, Kendy Haston, MD       haloperidol lactate (HALDOL) injection 10 mg  10 mg Intramuscular TID PRN Carrion-Carrero, Jerimey Burridge, MD       And   diphenhydrAMINE (BENADRYL) injection 50 mg  50 mg Intramuscular TID PRN Carrion-Carrero, Guled Gahan, MD       And   LORazepam (ATIVAN) injection 2 mg  2 mg Intramuscular TID PRN Carrion-Carrero, Reginal Wojcicki, MD       fluticasone (FLONASE) 50 MCG/ACT nasal spray 2 spray  2 spray Each Nare Daily PRN Carrion-Carrero,  Bode Pieper, MD       guaiFENesin (MUCINEX) 12 hr tablet 600 mg  600 mg Oral BID PRN Carrion-Carrero, Monzerrat Wellen, MD       hydrOXYzine (ATARAX) tablet 25 mg  25 mg Oral TID PRN Carrion-Carrero, Rosaleigh Brazzel, MD       loratadine (CLARITIN) tablet 10 mg  10 mg Oral Daily PRN Carrion-Carrero, Merissa Renwick, MD       magnesium hydroxide (MILK OF MAGNESIA) suspension 30 mL  30 mL Oral Daily PRN Carrion-Carrero, Rykar Lebleu, MD       naphazoline-pheniramine (NAPHCON-A) 0.025-0.3 % ophthalmic solution 2 drop  2 drop Both Eyes QID PRN Carrion-Carrero, Sagan Wurzel, MD       traZODone (DESYREL) tablet 50 mg  50 mg Oral QHS PRN Carrion-Carrero, Sephiroth Mcluckie, MD       Current Outpatient Medications  Medication Sig Dispense Refill   ARIPiprazole (ABILIFY) 10 MG tablet Take 10 mg by mouth daily.     bictegravir-emtricitabine-tenofovir AF (BIKTARVY) 50-200-25 MG TABS tablet Take by mouth daily.      Labs  Lab Results:  Admission on 02/21/2024  Component Date Value Ref Range Status   WBC 02/22/2024 5.7  4.0 - 10.5 K/uL Final   RBC 02/22/2024 4.30  4.22 - 5.81 MIL/uL Final   Hemoglobin 02/22/2024 13.4  13.0 - 17.0 g/dL Final   HCT 54/08/8118 40.3  39.0 - 52.0 %  Final   MCV 02/22/2024 93.7  80.0 - 100.0 fL Final   MCH 02/22/2024 31.2  26.0 - 34.0 pg Final   MCHC 02/22/2024 33.3  30.0 - 36.0 g/dL Final   RDW 44/12/270 14.3  11.5 - 15.5 % Final   Platelets 02/22/2024 214  150 - 400 K/uL Final   nRBC 02/22/2024 0.0  0.0 - 0.2 % Final   Neutrophils Relative % 02/22/2024 42  % Final   Neutro Abs 02/22/2024 2.4  1.7 - 7.7 K/uL Final   Lymphocytes Relative 02/22/2024 50  % Final   Lymphs Abs 02/22/2024 2.8  0.7 - 4.0 K/uL Final   Monocytes Relative 02/22/2024 5  % Final   Monocytes Absolute 02/22/2024 0.3  0.1 - 1.0 K/uL Final   Eosinophils Relative 02/22/2024 3  % Final   Eosinophils Absolute 02/22/2024 0.2  0.0 - 0.5 K/uL Final   Basophils Relative 02/22/2024 0  % Final   Basophils Absolute 02/22/2024 0.0  0.0 - 0.1 K/uL Final    Immature Granulocytes 02/22/2024 0  % Final   Abs Immature Granulocytes 02/22/2024 0.01  0.00 - 0.07 K/uL Final   Performed at [facility], [address], [place], [place] [address]   Sodium 02/22/2024 135  135 - 145 mmol/L Final   Potassium 02/22/2024 3.8  3.5 - 5.1 mmol/L Final   Chloride 02/22/2024 105  98 - 111 mmol/L Final   CO2 02/22/2024 26  22 - 32 mmol/L Final   Glucose, Bld 02/22/2024 89  70 - 99 mg/dL Final   Glucose reference range applies only to samples taken after fasting for at least 8 hours.   BUN 02/22/2024 22 (H)  6 - 20 mg/dL Final   Creatinine, Ser 02/22/2024 1.13  0.61 - 1.24 mg/dL Final   Calcium 40/34/7425 8.5 (L)  8.9 - 10.3 mg/dL Final   Total Protein 95/63/8756 6.6  6.5 - 8.1 g/dL Final   Albumin 43/32/9518 3.2 (L)  3.5 - 5.0 g/dL Final   AST 84/16/6063 14 (L)  15 - 41 U/L Final   ALT 02/22/2024 15  0 - 44 U/L Final   Alkaline Phosphatase 02/22/2024 42  38 - 126 U/L Final   Total Bilirubin 02/22/2024 0.4  0.0 - 1.2 mg/dL Final   GFR, Estimated 02/22/2024 >60  >60 mL/min Final   Comment: (NOTE) Calculated using the CKD-EPI Creatinine Equation (2021)    Anion gap 02/22/2024 4 (L)  5 - 15 Final   Performed at [facility], [address] [place], [place] [address]   TSH 02/22/2024 2.786  0.350 - 4.500 uIU/mL Final   Comment: Performed by a 3rd Generation assay with a functional sensitivity of <=0.01 uIU/mL. Performed at [facility], [address] [place], [place] [address]    RPR Ser Ql 02/22/2024 NON REACTIVE  NON REACTIVE Final   Performed at [facility], [address], [place], [place] [address]   Hepatitis B Surface Ag 02/22/2024 NON REACTIVE  NON REACTIVE Final   HCV Ab 02/22/2024 NON REACTIVE  NON REACTIVE Final   Comment: (NOTE) Nonreactive HCV antibody screen is consistent with no HCV infections,  unless recent infection is suspected or other evidence exists to indicate HCV infection.     Hep A IgM 02/22/2024 NON  REACTIVE  NON REACTIVE Final   Hep B C IgM 02/22/2024 NON REACTIVE  NON REACTIVE Final   Performed at [facility], [address]  [place], [place] [address]  Admission on 02/19/2024, Discharged on 02/21/2024  Component Date Value Ref Range Status   Glucose-Capillary 02/21/2024 90  70 - 99 mg/dL Final   Glucose reference range applies only to samples taken after fasting for at least 8 hours.  Admission on 02/18/2024, Discharged on 02/19/2024  Component Date Value Ref Range Status   WBC 02/18/2024 6.6  4.0 - 10.5 K/uL Final   RBC 02/18/2024 4.78  4.22 - 5.81 MIL/uL Final   Hemoglobin 02/18/2024 15.0  13.0 - 17.0 g/dL Final   HCT 91/47/8295 45.0  39.0 - 52.0 % Final   MCV 02/18/2024 94.1  80.0 - 100.0 fL Final   MCH 02/18/2024 31.4  26.0 - 34.0 pg Final   MCHC 02/18/2024 33.3  30.0 - 36.0 g/dL Final   RDW 62/13/0865 14.5  11.5 - 15.5 % Final   Platelets 02/18/2024 238  150 - 400 K/uL Final   nRBC 02/18/2024 0.0  0.0 - 0.2 % Final   Neutrophils Relative % 02/18/2024 57  % Final   Neutro Abs 02/18/2024 3.8  1.7 - 7.7 K/uL Final   Lymphocytes Relative 02/18/2024 31  % Final   Lymphs Abs 02/18/2024 2.0  0.7 - 4.0 K/uL Final   Monocytes Relative 02/18/2024 9  % Final   Monocytes Absolute 02/18/2024 0.6  0.1 - 1.0 K/uL Final   Eosinophils Relative 02/18/2024 2  % Final   Eosinophils Absolute 02/18/2024 0.1  0.0 - 0.5 K/uL Final   Basophils Relative 02/18/2024 1  % Final   Basophils Absolute 02/18/2024 0.0  0.0 - 0.1 K/uL Final   Immature Granulocytes 02/18/2024 0  % Final   Abs Immature Granulocytes 02/18/2024 0.02  0.00 - 0.07 K/uL Final   Performed at [facility], [address] [place], [place] [address]   Sodium 02/18/2024 136  135 - 145 mmol/L Final   Potassium 02/18/2024 4.2  3.5 - 5.1 mmol/L Final   Chloride 02/18/2024 100  98 - 111 mmol/L Final   CO2 02/18/2024 28  22 - 32 mmol/L Final   Glucose, Bld 02/18/2024 141 (H)  70 - 99 mg/dL Final   Glucose reference range  applies only to samples taken after fasting for at least 8 hours.   BUN 02/18/2024 15  6 - 20 mg/dL Final   Creatinine, Ser 02/18/2024 1.09  0.61 - 1.24 mg/dL Final   Calcium 62/95/2841 9.6  8.9 - 10.3 mg/dL Final   Total Protein 32/44/0102 7.9  6.5 - 8.1 g/dL Final   Albumin 72/53/6644 4.0  3.5 - 5.0 g/dL Final   AST 03/47/4259 19  15 - 41 U/L Final   ALT 02/18/2024 21  0 - 44 U/L Final   Alkaline Phosphatase 02/18/2024 54  38 - 126 U/L Final   Total Bilirubin 02/18/2024 0.6  0.0 - 1.2 mg/dL Final   GFR, Estimated 02/18/2024 >60  >60 mL/min Final   Comment: (NOTE) Calculated using the CKD-EPI Creatinine Equation (2021)    Anion gap 02/18/2024 8  5 - 15 Final   Performed at [facility], [address], [place], [place] [address]   Hgb A1c MFr Bld 02/18/2024 5.6  4.8 - 5.6 % Final   Comment: (NOTE) Pre diabetes:          5.7%-6.4%  Diabetes:              >6.4%  Glycemic control for   <7.0% adults with diabetes    Mean Plasma Glucose  02/18/2024 114.02  mg/dL Final   Performed at [facility], [address] [place], [place] [address]   Alcohol, Ethyl (B) 02/18/2024 <10  <10 mg/dL Final   Comment: (NOTE) Lowest detectable limit for serum alcohol is 10 mg/dL.  For medical purposes only. Performed at [facility], [address] [place], [place] [address]    Cholesterol 02/18/2024 154  0 - 200 mg/dL Final   Triglycerides 09/81/1914 63  <150 mg/dL Final   HDL 78/29/5621 47  >40 mg/dL Final   Total CHOL/HDL Ratio 02/18/2024 3.3  RATIO Final   VLDL 02/18/2024 13  0 - 40 mg/dL Final   LDL Cholesterol 02/18/2024 94  0 - 99 mg/dL Final   Comment:        Total Cholesterol/HDL:CHD Risk Coronary Heart Disease Risk Table                     Men   Women  1/2 Average Risk   3.4   3.3  Average Risk       5.0   4.4  2 X Average Risk   9.6   7.1  3 X Average Risk  23.4   11.0        Use the calculated Patient Ratio above and the CHD Risk Table to determine the  patient's CHD Risk.        ATP III CLASSIFICATION (LDL):  <100     mg/dL   Optimal  308-657  mg/dL   Near or Above                    Optimal  130-159  mg/dL   Borderline  846-962  mg/dL   High  >952     mg/dL   Very High Performed at [facility], [address] [place], [place] [address]    TSH 02/18/2024 3.451  0.350 - 4.500 uIU/mL Final   Comment: Performed by a 3rd Generation assay with a functional sensitivity of <=0.01 uIU/mL. Performed at [facility], [address], [place], [place] [address]    POC Amphetamine UR 02/18/2024 None Detected  NONE DETECTED (Cut Off Level 1000 ng/mL) Final   POC Secobarbital (BAR) 02/18/2024 None Detected  NONE DETECTED (Cut Off Level 300 ng/mL) Final   POC Buprenorphine (BUP) 02/18/2024 None Detected  NONE DETECTED (Cut Off Level 10 ng/mL) Final   POC Oxazepam (BZO) 02/18/2024 None Detected  NONE DETECTED (Cut Off Level 300 ng/mL) Final   POC Cocaine UR 02/18/2024 None Detected  NONE DETECTED (Cut Off Level 300 ng/mL) Final   POC Methamphetamine UR 02/18/2024 Positive (A)  NONE DETECTED (Cut Off Level 1000 ng/mL) Final   POC Morphine 02/18/2024 None Detected  NONE DETECTED (Cut Off Level 300 ng/mL) Final   POC Methadone UR 02/18/2024 None Detected  NONE DETECTED (Cut Off Level 300 ng/mL) Final   POC Oxycodone UR 02/18/2024 None Detected  NONE DETECTED (Cut Off Level 100 ng/mL) Final   POC Marijuana UR 02/18/2024 None Detected  NONE DETECTED (Cut Off Level 50 ng/mL) Final  Admission on 01/16/2024, Discharged on 01/19/2024  Component Date Value Ref Range Status   Sodium 01/16/2024 136  135 - 145 mmol/L Final   Potassium 01/16/2024 4.2  3.5 - 5.1 mmol/L Final   Chloride 01/16/2024 101  98 - 111 mmol/L Final   CO2 01/16/2024 25  22 - 32 mmol/L Final   Glucose, Bld 01/16/2024 86  70 - 99 mg/dL Final   Glucose reference range applies only to samples taken after fasting for at least 8 hours.   BUN 01/16/2024 11  6 - 20 mg/dL Final    Creatinine, Ser 01/16/2024 1.07  0.61 - 1.24 mg/dL Final   Calcium 16/09/9603 9.0  8.9 - 10.3 mg/dL Final   Total Protein 54/08/8118 7.6  6.5 - 8.1 g/dL Final   Albumin 14/78/2956 3.7  3.5 - 5.0 g/dL Final   AST 21/30/8657 20  15 - 41 U/L Final   ALT 01/16/2024 18  0 - 44 U/L Final   Alkaline Phosphatase 01/16/2024 51  38 - 126 U/L Final   Total Bilirubin 01/16/2024 0.4  0.0 - 1.2 mg/dL Final   GFR, Estimated 01/16/2024 >60  >60 mL/min Final   Comment: (NOTE) Calculated using the CKD-EPI Creatinine Equation (2021)    Anion gap 01/16/2024 10  5 - 15 Final   Performed at [facility], [address], [place], [place] [address]   Lactic Acid, Venous 01/16/2024 1.2  0.5 - 1.9 mmol/L Final   WBC 01/16/2024 9.0  4.0 - 10.5 K/uL Final   RBC 01/16/2024 4.42  4.22 - 5.81 MIL/uL Final   Hemoglobin 01/16/2024 13.7  13.0 - 17.0 g/dL Final   HCT 29/52/8413 41.4  39.0 - 52.0 % Final   MCV 01/16/2024 93.7  80.0 - 100.0 fL Final   MCH 01/16/2024 31.0  26.0 - 34.0 pg Final   MCHC 01/16/2024 33.1  30.0 - 36.0 g/dL Final   RDW 24/40/1027 14.0  11.5 - 15.5 % Final   Platelets 01/16/2024 302  150 - 400 K/uL Final   nRBC 01/16/2024 0.0  0.0 - 0.2 % Final   Neutrophils Relative % 01/16/2024 59  % Final   Neutro Abs 01/16/2024 5.2  1.7 - 7.7 K/uL Final   Lymphocytes Relative 01/16/2024 31  % Final   Lymphs Abs 01/16/2024 2.8  0.7 - 4.0 K/uL Final   Monocytes Relative 01/16/2024 6  % Final   Monocytes Absolute 01/16/2024 0.5  0.1 - 1.0 K/uL Final   Eosinophils Relative 01/16/2024 4  % Final   Eosinophils Absolute 01/16/2024 0.3  0.0 - 0.5 K/uL Final   Basophils Relative 01/16/2024 0  % Final   Basophils Absolute 01/16/2024 0.0  0.0 - 0.1 K/uL Final   Immature Granulocytes 01/16/2024 0  % Final   Abs Immature Granulocytes 01/16/2024 0.04  0.00 - 0.07 K/uL Final   Performed at [facility], [address] [place], [place] [address]   Prothrombin Time 01/16/2024 13.4  11.4 - 15.2 seconds Final    INR 01/16/2024 1.0  0.8 - 1.2 Final   Comment: (NOTE) INR goal varies based on device and disease states. Performed at [facility], [address] [place], [place] [address]    Specimen Description 01/16/2024 BLOOD RIGHT ARM   Final   Special Requests 01/16/2024 BOTTLES DRAWN AEROBIC AND ANAEROBIC Blood Culture adequate volume   Final   Culture 01/16/2024    Final                   Value:NO GROWTH 5 DAYS Performed at [facility], [address], [place], [place] [address]    Report Status 01/16/2024 01/22/2024 FINAL   Final   Specimen Description 01/16/2024 BLOOD RIGHT HAND   Final   Special Requests 01/16/2024 BOTTLES DRAWN AEROBIC AND ANAEROBIC Blood Culture adequate volume   Final  Culture  Setup Time 01/16/2024    Final                   Value:GRAM POSITIVE COCCI IN CLUSTERS ANAEROBIC BOTTLE ONLY CRITICAL RESULT CALLED TO, READ BACK BY AND VERIFIED WITH: PHARMD K. HURTH [id number] @ 2121 FH    Culture 01/16/2024  (A)   Final                   Value:STAPHYLOCOCCUS EPIDERMIDIS THE SIGNIFICANCE OF ISOLATING THIS ORGANISM FROM A SINGLE SET OF BLOOD CULTURES WHEN MULTIPLE SETS ARE DRAWN IS UNCERTAIN. PLEASE NOTIFY THE MICROBIOLOGY DEPARTMENT WITHIN ONE WEEK IF SPECIATION AND SENSITIVITIES ARE REQUIRED. Performed at [facility], [address], [place], [place] [address]    Report Status 01/16/2024 01/19/2024 FINAL   Final   CD4 T Cell Abs 01/17/2024 919  400 - 1,790 /uL Final   CD4 % Helper T Cell 01/17/2024 36  33 - 65 % Final   Performed at [facility], [address], [place], [place] [address]   WBC 01/17/2024 7.9  4.0 - 10.5 K/uL Final   RBC 01/17/2024 3.98 (L)  4.22 - 5.81 MIL/uL Final   Hemoglobin 01/17/2024 12.4 (L)  13.0 - 17.0 g/dL Final   HCT 14/78/2956 37.7 (L)  39.0 - 52.0 % Final   MCV 01/17/2024 94.7  80.0 - 100.0 fL Final   MCH 01/17/2024 31.2  26.0 - 34.0 pg Final   MCHC 01/17/2024 32.9  30.0 - 36.0 g/dL Final   RDW  21/30/8657 13.8  11.5 - 15.5 % Final   Platelets 01/17/2024 264  150 - 400 K/uL Final   nRBC 01/17/2024 0.0  0.0 - 0.2 % Final   Performed at [facility], [address], [place], [place] [address]   Creatinine, Ser 01/17/2024 1.22  0.61 - 1.24 mg/dL Final   GFR, Estimated 01/17/2024 >60  >60 mL/min Final   Comment: (NOTE) Calculated using the CKD-EPI Creatinine Equation (2021) Performed at [facility], [address] [place], [place] [address]    WBC 01/18/2024 4.7  4.0 - 10.5 K/uL Final   RBC 01/18/2024 4.45  4.22 - 5.81 MIL/uL Final   Hemoglobin 01/18/2024 13.7  13.0 - 17.0 g/dL Final   HCT 41/32/4401 41.0  39.0 - 52.0 % Final   MCV 01/18/2024 92.1  80.0 - 100.0 fL Final   MCH 01/18/2024 30.8  26.0 - 34.0 pg Final   MCHC 01/18/2024 33.4  30.0 - 36.0 g/dL Final   RDW 02/72/5366 13.6  11.5 - 15.5 % Final   Platelets 01/18/2024 272  150 - 400 K/uL Final   nRBC 01/18/2024 0.0  0.0 - 0.2 % Final   Neutrophils Relative % 01/18/2024 43  % Final   Neutro Abs 01/18/2024 2.0  1.7 - 7.7 K/uL Final   Lymphocytes Relative 01/18/2024 43  % Final   Lymphs Abs 01/18/2024 2.0  0.7 - 4.0 K/uL Final   Monocytes Relative 01/18/2024 6  % Final   Monocytes Absolute 01/18/2024 0.3  0.1 - 1.0 K/uL Final   Eosinophils Relative 01/18/2024 7  % Final   Eosinophils Absolute 01/18/2024 0.3  0.0 - 0.5 K/uL Final   Basophils Relative 01/18/2024 1  % Final   Basophils Absolute 01/18/2024 0.0  0.0 - 0.1 K/uL Final   Immature Granulocytes 01/18/2024 0  % Final   Abs Immature Granulocytes 01/18/2024 0.00  0.00 - 0.07 K/uL Final  Performed at [facility], [address] [place], [place] [address]   Sodium 01/18/2024 138  135 - 145 mmol/L Final   Potassium 01/18/2024 4.1  3.5 - 5.1 mmol/L Final   Chloride 01/18/2024 101  98 - 111 mmol/L Final   CO2 01/18/2024 25  22 - 32 mmol/L Final   Glucose, Bld 01/18/2024 89  70 - 99 mg/dL Final   Glucose reference range applies only to samples taken  after fasting for at least 8 hours.   BUN 01/18/2024 13  6 - 20 mg/dL Final   Creatinine, Ser 01/18/2024 1.16  0.61 - 1.24 mg/dL Final   Calcium 12/07/7251 9.3  8.9 - 10.3 mg/dL Final   GFR, Estimated 01/18/2024 >60  >60 mL/min Final   Comment: (NOTE) Calculated using the CKD-EPI Creatinine Equation (2021)    Anion gap 01/18/2024 12  5 - 15 Final   Performed at [facility], [address], [place], [place] [address]   Enterococcus faecalis 01/16/2024 NOT DETECTED  NOT DETECTED Final   Enterococcus Faecium 01/16/2024 NOT DETECTED  NOT DETECTED Final   Listeria monocytogenes 01/16/2024 NOT DETECTED  NOT DETECTED Final   Staphylococcus species 01/16/2024 DETECTED (A)  NOT DETECTED Final   Comment: CRITICAL RESULT CALLED TO, READ BACK BY AND VERIFIED WITH: PHARMD K. HURTH [id number] @2121  FH    Staphylococcus aureus (BCID) 01/16/2024 NOT DETECTED  NOT DETECTED Final   Staphylococcus epidermidis 01/16/2024 DETECTED (A)  NOT DETECTED Final   Comment: CRITICAL RESULT CALLED TO, READ BACK BY AND VERIFIED WITH: PHARMD K. HURTH [id number] @2121  FH    Staphylococcus lugdunensis 01/16/2024 NOT DETECTED  NOT DETECTED Final   Streptococcus species 01/16/2024 NOT DETECTED  NOT DETECTED Final   Streptococcus agalactiae 01/16/2024 NOT DETECTED  NOT DETECTED Final   Streptococcus pneumoniae 01/16/2024 NOT DETECTED  NOT DETECTED Final   Streptococcus pyogenes 01/16/2024 NOT DETECTED  NOT DETECTED Final   A.calcoaceticus-baumannii 01/16/2024 NOT DETECTED  NOT DETECTED Final   Bacteroides fragilis 01/16/2024 NOT DETECTED  NOT DETECTED Final   Enterobacterales 01/16/2024 NOT DETECTED  NOT DETECTED Final   Enterobacter cloacae complex 01/16/2024 NOT DETECTED  NOT DETECTED Final   Escherichia coli 01/16/2024 NOT DETECTED  NOT DETECTED Final   Klebsiella aerogenes 01/16/2024 NOT DETECTED  NOT DETECTED Final   Klebsiella oxytoca 01/16/2024 NOT DETECTED  NOT DETECTED Final   Klebsiella pneumoniae 01/16/2024  NOT DETECTED  NOT DETECTED Final   Proteus species 01/16/2024 NOT DETECTED  NOT DETECTED Final   Salmonella species 01/16/2024 NOT DETECTED  NOT DETECTED Final   Serratia marcescens 01/16/2024 NOT DETECTED  NOT DETECTED Final   Haemophilus influenzae 01/16/2024 NOT DETECTED  NOT DETECTED Final   Neisseria meningitidis 01/16/2024 NOT DETECTED  NOT DETECTED Final   Pseudomonas aeruginosa 01/16/2024 NOT DETECTED  NOT DETECTED Final   Stenotrophomonas maltophilia 01/16/2024 NOT DETECTED  NOT DETECTED Final   Candida albicans 01/16/2024 NOT DETECTED  NOT DETECTED Final   Candida auris 01/16/2024 NOT DETECTED  NOT DETECTED Final   Candida glabrata 01/16/2024 NOT DETECTED  NOT DETECTED Final   Candida krusei 01/16/2024 NOT DETECTED  NOT DETECTED Final   Candida parapsilosis 01/16/2024 NOT DETECTED  NOT DETECTED Final   Candida tropicalis 01/16/2024 NOT DETECTED  NOT DETECTED Final   Cryptococcus neoformans/gattii 01/16/2024 NOT DETECTED  NOT DETECTED Final   Methicillin resistance mecA/C 01/16/2024 NOT DETECTED  NOT DETECTED Final   Performed at [facility], [address], [place], [place] [address]  Total lymphocyte count 01/18/2024 2,126  1,000 - 4,000 /uL Final   CD4% 01/18/2024 39.16  33 - 65 % Final   CD4 absolute 01/18/2024 833  400 - 1,790 /uL Final   CD8tox 01/18/2024 34.88  12 - 40 % Final   CD8 T Cell Abs 01/18/2024 742  190 - 1,000 /uL Final   Ratio 01/18/2024 1.12  1.0 - 3.0 Final   Performed at [facility], [address], [place], [place] [address]  Admission on 11/27/2023, Discharged on 12/01/2023  Component Date Value Ref Range Status   HIV 1 RNA Quant 11/29/2023 12,600  copies/mL Corrected   Comment: (NOTE) The reportable range for this assay is 20 to 10,000,000 copies HIV-1 RNA/mL.    LOG10 HIV-1 RNA 11/29/2023 4.100  log10copy/mL Final   Comment: (NOTE) Performed At: [facility] [address] [place], [place] [id number] Jolene Schimke MD [id number]    Neisseria Gonorrhea 11/30/2023 Negative   Final   Chlamydia 11/30/2023 Negative   Final   Trichomonas 11/30/2023 Negative   Final   Comment 11/30/2023 Normal Reference Ranger Chlamydia - Negative   Final   Comment 11/30/2023 Normal Reference Range Neisseria Gonorrhea - Negative   Final   Comment 11/30/2023 Normal Reference Range Trichomonas - Negative   Final  Admission on 11/26/2023, Discharged on 11/27/2023  Component Date Value Ref Range Status   WBC 11/26/2023 6.5  4.0 - 10.5 K/uL Final   RBC 11/26/2023 4.17 (L)  4.22 - 5.81 MIL/uL Final   Hemoglobin 11/26/2023 12.7 (L)  13.0 - 17.0 g/dL Final   HCT 56/38/7564 38.7 (L)  39.0 - 52.0 % Final   MCV 11/26/2023 92.8  80.0 - 100.0 fL Final   MCH 11/26/2023 30.5  26.0 - 34.0 pg Final   MCHC 11/26/2023 32.8  30.0 - 36.0 g/dL Final   RDW 33/29/5188 14.4  11.5 - 15.5 % Final   Platelets 11/26/2023 229  150 - 400 K/uL Final   nRBC 11/26/2023 0.0  0.0 - 0.2 % Final   Neutrophils Relative % 11/26/2023 55  % Final   Neutro Abs 11/26/2023 3.6  1.7 - 7.7 K/uL Final   Lymphocytes Relative 11/26/2023 32  % Final   Lymphs Abs 11/26/2023 2.1  0.7 - 4.0 K/uL Final   Monocytes Relative 11/26/2023 7  % Final   Monocytes Absolute 11/26/2023 0.4  0.1 - 1.0 K/uL Final   Eosinophils Relative 11/26/2023 5  % Final   Eosinophils Absolute 11/26/2023 0.3  0.0 - 0.5 K/uL Final   Basophils Relative 11/26/2023 1  % Final   Basophils Absolute 11/26/2023 0.0  0.0 - 0.1 K/uL Final   Immature Granulocytes 11/26/2023 0  % Final   Abs Immature Granulocytes 11/26/2023 0.01  0.00 - 0.07 K/uL Final   Performed at [facility], [address], [place], [place] [address]   Sodium 11/26/2023 140  135 - 145 mmol/L Final   Potassium 11/26/2023 4.2  3.5 - 5.1 mmol/L Final   Chloride 11/26/2023 111  98 - 111 mmol/L Final   CO2 11/26/2023 25  22 - 32 mmol/L Final   Glucose, Bld 11/26/2023 86  70 - 99 mg/dL Final   Glucose reference range  applies only to samples taken after fasting for at least 8 hours.   BUN 11/26/2023 11  6 - 20 mg/dL Final   Creatinine, Ser 11/26/2023 0.93  0.61 - 1.24 mg/dL Final   Calcium 63/12/6008 9.0  8.9 - 10.3 mg/dL  Final   Total Protein 11/26/2023 6.5  6.5 - 8.1 g/dL Final   Albumin 33/29/5188 3.6  3.5 - 5.0 g/dL Final   AST 41/66/0630 36  15 - 41 U/L Final   ALT 11/26/2023 25  0 - 44 U/L Final   Alkaline Phosphatase 11/26/2023 51  38 - 126 U/L Final   Total Bilirubin 11/26/2023 0.5  <1.2 mg/dL Final   GFR, Estimated 11/26/2023 >60  >60 mL/min Final   Comment: (NOTE) Calculated using the CKD-EPI Creatinine Equation (2021)    Anion gap 11/26/2023 4 (L)  5 - 15 Final   Performed at [facility], [address], [place], [place] [address]   Alcohol, Ethyl (B) 11/26/2023 <10  <10 mg/dL Final   Comment: (NOTE) Lowest detectable limit for serum alcohol is 10 mg/dL.  For medical purposes only. Performed at [facility], [address], [place], [place] [address]    POC Amphetamine UR 11/26/2023 None Detected  NONE DETECTED (Cut Off Level 1000 ng/mL) Final   POC Secobarbital (BAR) 11/26/2023 None Detected  NONE DETECTED (Cut Off Level 300 ng/mL) Final   POC Buprenorphine (BUP) 11/26/2023 None Detected  NONE DETECTED (Cut Off Level 10 ng/mL) Final   POC Oxazepam (BZO) 11/26/2023 None Detected  NONE DETECTED (Cut Off Level 300 ng/mL) Final   POC Cocaine UR 11/26/2023 None Detected  NONE DETECTED (Cut Off Level 300 ng/mL) Final   POC Methamphetamine UR 11/26/2023 None Detected  NONE DETECTED (Cut Off Level 1000 ng/mL) Final   POC Morphine 11/26/2023 None Detected  NONE DETECTED (Cut Off Level 300 ng/mL) Final   POC Methadone UR 11/26/2023 None Detected  NONE DETECTED (Cut Off Level 300 ng/mL) Final   POC Oxycodone UR 11/26/2023 None Detected  NONE DETECTED (Cut Off Level 100 ng/mL) Final   POC Marijuana UR 11/26/2023 None Detected  NONE DETECTED (Cut Off Level 50 ng/mL) Final    Cholesterol 11/26/2023 148  0 - 200 mg/dL Final   Triglycerides 57/32/2025 65  <150 mg/dL Final   HDL 42/70/6237 43  >40 mg/dL Final   Total CHOL/HDL Ratio 11/26/2023 3.4  RATIO Final   VLDL 11/26/2023 13  0 - 40 mg/dL Final   LDL Cholesterol 11/26/2023 92  0 - 99 mg/dL Final   Comment:        Total Cholesterol/HDL:CHD Risk Coronary Heart Disease Risk Table                     Men   Women  1/2 Average Risk   3.4   3.3  Average Risk       5.0   4.4  2 X Average Risk   9.6   7.1  3 X Average Risk  23.4   11.0        Use the calculated Patient Ratio above and the CHD Risk Table to determine the patient's CHD Risk.        ATP III CLASSIFICATION (LDL):  <100     mg/dL   Optimal  628-315  mg/dL   Near or Above                    Optimal  130-159  mg/dL   Borderline  176-160  mg/dL   High  >737     mg/dL   Very High Performed at [facility], [address], [place], [place] [address]   Admission on 10/23/2023, Discharged on 10/23/2023  Component  Date Value Ref Range Status   Sodium 10/23/2023 134 (L)  135 - 145 mmol/L Final   Potassium 10/23/2023 4.4  3.5 - 5.1 mmol/L Final   Chloride 10/23/2023 106  98 - 111 mmol/L Final   CO2 10/23/2023 18 (L)  22 - 32 mmol/L Final   Glucose, Bld 10/23/2023 87  70 - 99 mg/dL Final   Glucose reference range applies only to samples taken after fasting for at least 8 hours.   BUN 10/23/2023 10  6 - 20 mg/dL Final   Creatinine, Ser 10/23/2023 1.06  0.61 - 1.24 mg/dL Final   Calcium 19/14/7829 9.1  8.9 - 10.3 mg/dL Final   Total Protein 56/21/3086 7.6  6.5 - 8.1 g/dL Final   Albumin 57/84/6962 3.9  3.5 - 5.0 g/dL Final   AST 95/28/4132 29  15 - 41 U/L Final   ALT 10/23/2023 16  0 - 44 U/L Final   Alkaline Phosphatase 10/23/2023 51  38 - 126 U/L Final   Total Bilirubin 10/23/2023 0.6  <1.2 mg/dL Final   GFR, Estimated 10/23/2023 >60  >60 mL/min Final   Comment: (NOTE) Calculated using the CKD-EPI Creatinine Equation (2021)    Anion gap  10/23/2023 10  5 - 15 Final   Performed at [facility], [address], [place], [place] [address]   Alcohol, Ethyl (B) 10/23/2023 <10  <10 mg/dL Final   Comment: (NOTE) Lowest detectable limit for serum alcohol is 10 mg/dL.  For medical purposes only. Performed at [facility], [address] [place], [place] [address]    Opiates 10/23/2023 NONE DETECTED  NONE DETECTED Final   Cocaine 10/23/2023 NONE DETECTED  NONE DETECTED Final   Benzodiazepines 10/23/2023 NONE DETECTED  NONE DETECTED Final   Amphetamines 10/23/2023 NONE DETECTED  NONE DETECTED Final   Tetrahydrocannabinol 10/23/2023 NONE DETECTED  NONE DETECTED Final   Barbiturates 10/23/2023 NONE DETECTED  NONE DETECTED Final   Comment: (NOTE) DRUG SCREEN FOR MEDICAL PURPOSES ONLY.  IF CONFIRMATION IS NEEDED FOR ANY PURPOSE, NOTIFY LAB WITHIN 5 DAYS.  LOWEST DETECTABLE LIMITS FOR URINE DRUG SCREEN Drug Class                     Cutoff (ng/mL) Amphetamine and metabolites    1000 Barbiturate and metabolites    200 Benzodiazepine                 200 Opiates and metabolites        300 Cocaine and metabolites        300 THC                            50 Performed at [facility], [address], [place], [place] [address]    WBC 10/23/2023 4.8  4.0 - 10.5 K/uL Final   RBC 10/23/2023 4.96  4.22 - 5.81 MIL/uL Final   Hemoglobin 10/23/2023 15.2  13.0 - 17.0 g/dL Final   HCT 34/74/2595 46.2  39.0 - 52.0 % Final   MCV 10/23/2023 93.1  80.0 - 100.0 fL Final   MCH 10/23/2023 30.6  26.0 - 34.0 pg Final   MCHC 10/23/2023 32.9  30.0 - 36.0 g/dL Final   RDW 63/87/5643 14.4  11.5 - 15.5 % Final   Platelets 10/23/2023 225  150 - 400 K/uL Final   nRBC 10/23/2023 0.0  0.0 - 0.2 % Final   Neutrophils Relative %  10/23/2023 40  % Final   Neutro Abs 10/23/2023 1.9  1.7 - 7.7 K/uL Final   Lymphocytes Relative 10/23/2023 44  % Final   Lymphs Abs 10/23/2023 2.2  0.7 - 4.0 K/uL Final   Monocytes Relative 10/23/2023 9  %  Final   Monocytes Absolute 10/23/2023 0.4  0.1 - 1.0 K/uL Final   Eosinophils Relative 10/23/2023 6  % Final   Eosinophils Absolute 10/23/2023 0.3  0.0 - 0.5 K/uL Final   Basophils Relative 10/23/2023 1  % Final   Basophils Absolute 10/23/2023 0.0  0.0 - 0.1 K/uL Final   Immature Granulocytes 10/23/2023 0  % Final   Abs Immature Granulocytes 10/23/2023 0.01  0.00 - 0.07 K/uL Final   Performed at [facility], [address], [place], [place] [address]   Total lymphocyte count 10/23/2023 2,194  1,000 - 4,000 /uL Final   CD4% 10/23/2023 26.20 (L)  33 - 65 % Final   CD4 absolute 10/23/2023 575  400 - 1,790 /uL Final   CD8tox 10/23/2023 34.30  12 - 40 % Final   CD8 T Cell Abs 10/23/2023 753  190 - 1,000 /uL Final   Ratio 10/23/2023 0.76 (L)  1.0 - 3.0 Final   Performed at [facility], [address], [place], [place] [address]    Blood Alcohol level:  Lab Results  Component Value Date   ETH <10 02/18/2024   ETH <10 11/26/2023    Metabolic Disorder Labs: Lab Results  Component Value Date   HGBA1C 5.6 02/18/2024   MPG 114.02 02/18/2024   MPG 122.63 06/13/2023   Lab Results  Component Value Date   PROLACTIN 19.4 06/13/2023   Lab Results  Component Value Date   CHOL 154 02/18/2024   TRIG 63 02/18/2024   HDL 47 02/18/2024   CHOLHDL 3.3 02/18/2024   VLDL 13 02/18/2024   LDLCALC 94 02/18/2024   LDLCALC 92 11/26/2023    Therapeutic Lab Levels: No results found for: "LITHIUM" No results found for: "VALPROATE" No results found for: "CBMZ"  Physical Findings   AIMS    Flowsheet Row [facility]  AIMS Total Score 0      AUDIT    Flowsheet Row Ad[facility]  Alcohol Use Disorder Identification Test Final Score (AUDIT) 0      PHQ2-9    Flowsheet Row ED from 02/21/2024 in [facility] ED from 02/19/2024 in [facility] Office Visit from 02/04/2022 in [facility] [place] Office Visit from 04/16/2021 in [facility] [place]  PHQ-2 Total Score 4 6 0 0  PHQ-9 Total Score 17 26 -- --      Flowsheet Row ED from 02/21/2024 in [facility] Department at [facility] ED from 02/19/2024 in [facility] ED from 02/18/2024 in [facility]  C-SSRS RISK CATEGORY No Risk High Risk High Risk        Musculoskeletal  Strength & Muscle Tone: within normal limits Gait & Station: normal Patient leans: N/A  Psychiatric Specialty Exam  Presentation  General Appearance:  Appropriate for Environment  Eye Contact: Fair  Speech: Clear and Coherent; Normal Rate  Speech Volume: Decreased  Handedness: -- (  not assessed)   Mood and Affect  Mood: Hopeless  Affect: Depressed; Constricted   Thought Process  Thought Processes: Linear  Descriptions of Associations:Intact  Orientation:None  Thought Content:Logical  Diagnosis of Schizophrenia or Schizoaffective disorder in past: No  Duration of Psychotic Symptoms: Greater than six months   Hallucinations:Hallucinations: None  Ideas of Reference:None  Suicidal Thoughts:Suicidal Thoughts: Yes, Passive SI Active Intent and/or Plan: Without Intent; Without Plan  Homicidal Thoughts:Homicidal Thoughts: No   Sensorium  Memory: Immediate Good; Recent Good; Remote Good  Judgment: Fair  Insight: Poor; Fair   Art therapist  Concentration: Fair  Attention Span: Fair  Recall: [employer] of Knowledge: Fair  Language: Fair   Psychomotor Activity  Psychomotor Activity: Psychomotor Activity: Normal   Assets  Assets: Desire for Improvement; Resilience; Communication  Skills   Sleep  Sleep: Sleep: Good   No data recorded   Physical Exam  Physical Exam Vitals and nursing note reviewed.  Constitutional:      General: He is not in acute distress.    Appearance: He is not ill-appearing.  HENT:     Head: Normocephalic and atraumatic.  Eyes:     Extraocular Movements: Extraocular movements intact.     Conjunctiva/sclera: Conjunctivae normal.  Pulmonary:     Effort: Pulmonary effort is normal. No respiratory distress.  Skin:    General: Skin is warm and dry.     Comments: Multiple excoriations at the forearms bilaterally    Review of Systems  All other systems reviewed and are negative.  Blood pressure 127/71, pulse 91, temperature 98.5 F (36.9 C), temperature source Oral, resp. rate 18, SpO2 99%. There is no height or weight on file to calculate BMI.  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 Patient requested Lexapro be discontinued as he reported sedation as an intolerable side effect to this medication Start Wellbutrin XL 150 mg daily Continue abilify 5 mg daily Clonidine discontinued due to orthostatic hypotension   Medical Issues Being Addressed:  VSS   HIV Continue home biktarvy   Other PRNs: acetaminophen, 650 mg, Q6H PRN alum & mag hydroxide-simeth, 30 mL, Q4H PRN diphenhydrAMINE, 25 mg, Q6H PRN haloperidol, 5 mg, TID PRN  And diphenhydrAMINE, 50 mg, TID PRN haloperidol lactate, 5 mg, TID PRN  And diphenhydrAMINE, 50 mg, TID PRN  And LORazepam, 2 mg, TID PRN haloperidol lactate, 10 mg, TID PRN  And diphenhydrAMINE, 50 mg, TID PRN  And LORazepam, 2 mg, TID PRN fluticasone, 2 spray, Daily PRN guaiFENesin, 600 mg, BID PRN hydrOXYzine, 25 mg, TID PRN loratadine, 10 mg, Daily PRN magnesium hydroxide,  30 mL, Daily PRN naphazoline-pheniramine, 2 drop, QID PRN traZODone, 50 mg, QHS PRN   Other Labs/Imaging Reviewed: CBC unremarkable, repeat CBC on 3/20 is unchanged CMP showing hyperglycemia 141, repeat on 3/20 is unremarkable A1C 5.6% Ethanol <10 Lipid panel is unremarkable TSH WNL  IVDU labs: RPR non-reactive Hepatitis panel non-reactive  EKG on 02/18/2024: QTc 399    Discharge Planning:   -- Social work and case management to assist with discharge planning and identification of hospital follow-up needs prior to discharge  -- Disposition: Discharge to [facility] on Monday 3/24  -- Discharge Concerns: Need to establish a safety plan; Medication compliance and effectiveness  -- Discharge Goals: Transition to residential rehabilitation;outpatient referrals for mental health follow-up  including medication management/psychotherapy     Signed: Lorri Frederick, MD 02/22/2024 3:30 PM

## 2024-02-21 NOTE — ED Provider Notes (Signed)
 [place] EMERGENCY DEPARTMENT AT [facility] Provider Note   CSN: [id number] Arrival date & time: 02/21/24  0830     History  Chief Complaint  Patient presents with   Loss of Consciousness    Kenneth Welch. is a 38 y.o. male.  The history is provided by the patient and medical records. No language interpreter was used.  Near Syncope This is a new problem. The current episode started less than 1 hour ago. The problem occurs rarely. The problem has been gradually improving. Pertinent negatives include no chest pain, no abdominal pain, no headaches and no shortness of breath. The symptoms are aggravated by standing. Nothing relieves the symptoms. He has tried nothing for the symptoms. The treatment provided no relief.       Home Medications Prior to Admission medications   Medication Sig Start Date End Date Taking? Authorizing Provider  ARIPiprazole (ABILIFY) 10 MG tablet Take 1 tablet (10 mg total) by mouth daily. 02/20/24   Karie Fetch, MD  bictegravir-emtricitabine-tenofovir AF (BIKTARVY) 50-200-25 MG TABS tablet Take 1 tablet by mouth daily. 02/21/24   Carrion-Carrero, Karle Starch, MD  escitalopram (LEXAPRO) 10 MG tablet Take 1 tablet (10 mg total) by mouth at bedtime. 02/21/24   Carrion-Carrero, Karle Starch, MD  fluticasone (FLONASE) 50 MCG/ACT nasal spray Place 2 sprays into both nostrils daily as needed for allergies or rhinitis. 02/21/24   Carrion-Carrero, Karle Starch, MD  guaiFENesin (MUCINEX) 600 MG 12 hr tablet Take 1 tablet (600 mg total) by mouth 2 (two) times daily as needed for cough or to loosen phlegm. 02/21/24   Carrion-Carrero, Karle Starch, MD  loratadine (CLARITIN) 10 MG tablet Take 1 tablet (10 mg total) by mouth daily as needed for allergies or rhinitis. 02/21/24   Carrion-Carrero, Karle Starch, MD  naphazoline-pheniramine (NAPHCON-A) 0.025-0.3 % ophthalmic solution Place 2 drops into both eyes 4 (four) times daily as needed for eye irritation. 02/21/24    Carrion-Carrero, Karle Starch, MD      Allergies    Patient has no known allergies.    Review of Systems   Review of Systems  Constitutional:  Positive for fatigue. Negative for chills, diaphoresis and fever.  HENT:  Negative for congestion.   Respiratory:  Negative for cough, chest tightness, shortness of breath and wheezing.   Cardiovascular:  Positive for syncope and near-syncope. Negative for chest pain.  Gastrointestinal:  Negative for abdominal pain, constipation, diarrhea, nausea and vomiting.  Genitourinary:  Negative for dysuria.  Musculoskeletal:  Negative for back pain, neck pain and neck stiffness.  Skin:  Positive for wound (wounds on arms from injecting per pt). Negative for rash.  Neurological:  Positive for light-headedness. Negative for syncope, weakness, numbness and headaches.  Psychiatric/Behavioral:  Negative for agitation and confusion.   All other systems reviewed and are negative.   Physical Exam Updated Vital Signs Ht 6\' 3"  (1.905 m)   Wt 72.6 kg   BMI 20.00 kg/m  Physical Exam Vitals and nursing note reviewed.  Constitutional:      General: He is not in acute distress.    Appearance: He is well-developed. He is not ill-appearing, toxic-appearing or diaphoretic.  HENT:     Head: Normocephalic and atraumatic.     Mouth/Throat:     Mouth: Mucous membranes are dry.     Pharynx: No oropharyngeal exudate or posterior oropharyngeal erythema.  Eyes:     Conjunctiva/sclera: Conjunctivae normal.     Pupils: Pupils are equal, round, and reactive to light.  Cardiovascular:  Rate and Rhythm: Normal rate and regular rhythm.     Pulses: Normal pulses.     Heart sounds: No murmur heard. Pulmonary:     Effort: Pulmonary effort is normal. No respiratory distress.     Breath sounds: Normal breath sounds. No stridor. No wheezing, rhonchi or rales.  Chest:     Chest wall: No tenderness.  Abdominal:     General: Abdomen is flat.     Palpations: Abdomen is soft.      Tenderness: There is no abdominal tenderness. There is no right CVA tenderness, left CVA tenderness, guarding or rebound.  Musculoskeletal:        General: No swelling or tenderness.     Cervical back: Neck supple. No tenderness.  Skin:    General: Skin is warm and dry.     Capillary Refill: Capillary refill takes less than 2 seconds.     Findings: Lesion (wounds on arms from injecting, nontender) present. No erythema or rash.  Neurological:     General: No focal deficit present.     Mental Status: He is alert.     Sensory: No sensory deficit.     Motor: No weakness.  Psychiatric:        Mood and Affect: Mood normal.     ED Results / Procedures / Treatments   Labs (all labs ordered are listed, but only abnormal results are displayed) Labs Reviewed - No data to display  EKG None  Radiology No results found.  Procedures Procedures    Medications Ordered in ED Medications  sodium chloride 0.9 % bolus 1,000 mL (0 mLs Intravenous Stopped 02/21/24 1000)  sodium chloride 0.9 % bolus 1,000 mL (0 mLs Intravenous Stopped 02/21/24 1130)    ED Course/ Medical Decision Making/ A&P                                 Medical Decision Making   Kenneth Welch. is a 38 y.o. male with a past medical history significant for HIV on antivirals, schizoaffective disorder, asthma, previous IV drug use and polysubstance abuse who comes from [facility] for a near syncopal event for rehydration.  According to patient and psychiatry resident who both called the previous team here and left a note, patient has not been drinking as much fluids over the last few days while he has been seeking help for his substance abuse.  He was feeling dehydrated and after he stood up to walk this morning, got lightheaded and was guided to the ground with near syncope.  He did not actually pass out and did not fall.  He reports no palpitations, chest pain, shortness of breath.  He just thinks his mouth is dry and he  is dehydrated.  He denies fevers, chills, nausea, vomiting, constipation, diarrhea, or urinary changes.  Denies any pain.  Just feels fatigued and lightheaded.  He denies any other complaints whatsoever.  The psychiatry team documented that they feel he just needs some fluids and reassessment if he is feeling well, he did not need a large medical workup today.  I discussed with the patient he agrees.  On my exam, lungs were clear.  Chest was nontender.  There was no murmur.  Abdomen was nontender.  Moving all extremities.  Dry mucous membranes and he feels lightheaded.  Blood pressure is around 100 systolic.  He is afebrile.  He is not tachycardic or tachypneic and is  not hypoxic.  Patient has no other complaints.  He does have some wounds on his arms from IV drug use but he was not having any pain with them and I have low suspicion for acute abscess at this time.  EKG showed no STEMI or significant arrhythmia initially.  Will give 2 L of fluids and reassess.  Per the psychiatry plan and after discussion with patient, will hold on extensive lab testing or infectious workup at this time.  Have low suspicion for cardiac cause.  He was having no chest pain palpitation shortness of breath.  Will give some fluids and reassess.  She is feeling better, will plan to send back to the behavioral with urgent care as they recommended.  If symptoms were to change or worsen, would consider more extensive workup and patient agrees with this.  Anticipate discharge if he feels better.  1:30 PM Patient feels much better after fluids and was able to eat and drink.  He is not feeling lightheaded at all or near syncopal.  He would like to go back to p.o. urgent care to continue his management.  Attempted to call over there to get acceptance for transfer back.  1:52 PM Spoke with Dr. Enedina Finner in the psychiatry team and they agree with plan for transfer back to behavioral urgent care.  He will be transferred  back.        Final Clinical Impression(s) / ED Diagnoses Final diagnoses:  Near syncope  Dehydration    Clinical Impression: 1. Near syncope   2. Dehydration     Disposition: Transferred back to her with urgent care after rehydration and resolution of lightheadedness.  This note was prepared with assistance of Conservation officer, historic buildings. Occasional wrong-word or sound-a-like substitutions may have occurred due to the inherent limitations of voice recognition software.     Mardy Hoppe, Canary Brim, MD 02/21/24 1353

## 2024-02-21 NOTE — Discharge Instructions (Signed)
 Patient is transferring to [facility] for medical evaluation

## 2024-02-21 NOTE — ED Triage Notes (Signed)
 Pt bib [facility] for syncopal episode at behavioral health. No known injuries noted.

## 2024-02-21 NOTE — Care Management (Addendum)
 [facility] Care Management   Per the intake worker at [facility], the patient referral packet has been received and their nursing staff is reviewing the patient chart for admission to their facility.   11:52am  Per Marcelino Duster, patient was accepted to [facility] on Thursday 02-22-2024 at 9am.   [address] [place], [place]  [phone]     12:24pm  Writer was informed that that patient was sent to [facility] ED.  Writer contacted [employer].  Per [facility], the patient referral packet will have to be re-submitted with updated labs and nursing notes regarding.

## 2024-02-21 NOTE — Group Note (Signed)
 Group Topic: Social Support  Group Date: 02/21/2024 Start Time: 2000 End Time: 2030 Facilitators: Rae Lips B  Department: [facility]  Number of Participants: 5  Group Focus: activities of daily living skills, anxiety, check in, daily focus, and social skills Treatment Modality:  Individual Therapy Interventions utilized were patient education and support Purpose: express feelings, increase insight, and regain self-worth  Name: Kenneth Welch. Date of Birth: 07/17/1986  MR: [id number]    Level of Participation: active Quality of Participation: cooperative Interactions with others: gave feedback Mood/Affect: appropriate Triggers (if applicable): NA Cognition: coherent/clear Progress: Gaining insight Response: NA Plan: patient will be encouraged to keep going to groups.   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

## 2024-02-21 NOTE — Group Note (Signed)
 Group Topic: Change and Accountability  Group Date: 02/21/2024 Start Time: 0945 End Time: 1012 Facilitators: Vonzell Schlatter B  Department: [facility]  Number of Participants: 6  Group Focus: daily focus and feeling awareness/expression Treatment Modality:  Psychoeducation Interventions utilized were patient education and support Purpose: increase insight and reinforce self-care  Name: Kenneth Welch. Date of Birth: 25-Mar-1986  MR: [id number]    Level of Participation: did not attend group he had to get tranferred to [facility] Quality of Participation:  Interactions with others:  Mood/Affect:  Triggers (if applicable):  Cognition:  Progress:  Response:  Plan: follow-up needed  Patients Problems:  Patient Active Problem List   Diagnosis Date Noted   Schizoaffective disorder, bipolar type (HCC) 02/19/2024   IVDU (intravenous drug user) 01/28/2024   Arm abscess 01/19/2024   Cellulitis 01/17/2024   PTSD (post-traumatic stress disorder) 11/29/2023   Methamphetamine use disorder, severe (HCC) 11/29/2023   Cannabis use disorder 11/29/2023   Long term current use of antipsychotic medication 11/29/2023   Schizoaffective disorder, depressive type (HCC) 11/27/2023   Homelessness 06/13/2023   Suicidal ideation 06/13/2023   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

## 2024-02-22 LAB — CBC WITH DIFFERENTIAL/PLATELET
Abs Immature Granulocytes: 0.01 10*3/uL (ref 0.00–0.07)
Basophils Absolute: 0 10*3/uL (ref 0.0–0.1)
Basophils Relative: 0 %
Eosinophils Absolute: 0.2 10*3/uL (ref 0.0–0.5)
Eosinophils Relative: 3 %
HCT: 40.3 % (ref 39.0–52.0)
Hemoglobin: 13.4 g/dL (ref 13.0–17.0)
Immature Granulocytes: 0 %
Lymphocytes Relative: 50 %
Lymphs Abs: 2.8 10*3/uL (ref 0.7–4.0)
MCH: 31.2 pg (ref 26.0–34.0)
MCHC: 33.3 g/dL (ref 30.0–36.0)
MCV: 93.7 fL (ref 80.0–100.0)
Monocytes Absolute: 0.3 10*3/uL (ref 0.1–1.0)
Monocytes Relative: 5 %
Neutro Abs: 2.4 10*3/uL (ref 1.7–7.7)
Neutrophils Relative %: 42 %
Platelets: 214 10*3/uL (ref 150–400)
RBC: 4.3 MIL/uL (ref 4.22–5.81)
RDW: 14.3 % (ref 11.5–15.5)
WBC: 5.7 10*3/uL (ref 4.0–10.5)
nRBC: 0 % (ref 0.0–0.2)

## 2024-02-22 LAB — COMPREHENSIVE METABOLIC PANEL
ALT: 15 U/L (ref 0–44)
AST: 14 U/L — ABNORMAL LOW (ref 15–41)
Albumin: 3.2 g/dL — ABNORMAL LOW (ref 3.5–5.0)
Alkaline Phosphatase: 42 U/L (ref 38–126)
Anion gap: 4 — ABNORMAL LOW (ref 5–15)
BUN: 22 mg/dL — ABNORMAL HIGH (ref 6–20)
CO2: 26 mmol/L (ref 22–32)
Calcium: 8.5 mg/dL — ABNORMAL LOW (ref 8.9–10.3)
Chloride: 105 mmol/L (ref 98–111)
Creatinine, Ser: 1.13 mg/dL (ref 0.61–1.24)
GFR, Estimated: >60 mL/min (ref >60)
Glucose, Bld: 89 mg/dL (ref 70–99)
Potassium: 3.8 mmol/L (ref 3.5–5.1)
Sodium: 135 mmol/L (ref 135–145)
Total Bilirubin: 0.4 mg/dL (ref 0.0–1.2)
Total Protein: 6.6 g/dL (ref 6.5–8.1)

## 2024-02-22 LAB — RPR: RPR Ser Ql: NONREACTIVE

## 2024-02-22 LAB — HEPATITIS PANEL, ACUTE
HCV Ab: NONREACTIVE
Hep A IgM: NONREACTIVE
Hep B C IgM: NONREACTIVE
Hepatitis B Surface Ag: NONREACTIVE

## 2024-02-22 LAB — TSH: TSH: 2.786 u[IU]/mL (ref 0.350–4.500)

## 2024-02-22 MED ORDER — BUPROPION HCL ER (XL) 150 MG PO TB24
150.0000 mg | ORAL_TABLET | Freq: Every day | ORAL | Status: DC
  Administered 2024-02-22 – 2024-02-25 (×4): 150 mg via ORAL
  Filled 2024-02-22 (×4): qty 1
  Filled 2024-02-22: qty 7
  Filled 2024-02-22: qty 1
  Filled 2024-02-22: qty 7

## 2024-02-22 NOTE — Group Note (Signed)
 Group Topic: Change and Accountability  Group Date: 02/22/2024 Start Time: 1900 End Time: 1959 Facilitators: Rae Lips B  Department: [facility]  Number of Participants: 5  Group Focus: abuse issues, acceptance, activities of daily living skills, anger management, anxiety, chemical dependency issues, relapse prevention, and substance abuse education Treatment Modality:  Exposure Therapy and Individual Therapy Interventions utilized were leisure development, problem solving, story telling, and support Purpose: enhance coping skills, express feelings, express irrational fears, increase insight, regain self-worth, reinforce self-care, relapse prevention strategies, and trigger / craving management  Name: Kenneth Welch. Date of Birth: 1986-10-10  MR: [id number]    Level of Participation: PT DID NOT ATTEND GROUP Quality of Participation: cooperative Interactions with others: gave feedback Mood/Affect: appropriate Triggers (if applicable): NA Cognition: coherent/clear Progress: None Response: NA Plan: patient will be encouraged to go to groups.   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

## 2024-02-22 NOTE — Group Note (Unsigned)
 Group Topic: Balance in Life  Group Date: 02/22/2024 Start Time: 1200 End Time: 1220 Facilitators: Vonzell Schlatter B  Department: [facility]  Number of Participants: 8  Group Focus: coping skills and daily focus Treatment Modality:  Psychoeducation Interventions utilized were problem solving and support Purpose: regain self-worth and reinforce self-care   Name: Kenneth Welch. Date of Birth: Feb 09, 1986  MR: [id number]    Level of Participation: {THERAPIES; PSYCH GROUP PARTICIPATION WNUUV:25366} Quality of Participation: {THERAPIES; PSYCH QUALITY OF PARTICIPATION:23992} Interactions with others: {THERAPIES; PSYCH INTERACTIONS:23993} Mood/Affect: {THERAPIES; PSYCH MOOD/AFFECT:23994} Triggers (if applicable): *** Cognition: {THERAPIES; PSYCH COGNITION:23995} Progress: {THERAPIES; PSYCH PROGRESS:23997} Response: *** Plan: {THERAPIES; PSYCH YQIH:47425}  Patients Problems:  Patient Active Problem List   Diagnosis Date Noted   Schizoaffective disorder, bipolar type (HCC) 02/19/2024   IVDU (intravenous drug user) 01/28/2024   Arm abscess 01/19/2024   Cellulitis 01/17/2024   PTSD (post-traumatic stress disorder) 11/29/2023   Methamphetamine use disorder, severe (HCC) 11/29/2023   Cannabis use disorder 11/29/2023   Long term current use of antipsychotic medication 11/29/2023   Schizoaffective disorder, depressive type (HCC) 11/27/2023   Homelessness 06/13/2023   Suicidal ideation 06/13/2023   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

## 2024-02-22 NOTE — ED Notes (Signed)
 Patient resting with eyes closed in no apparent acute distress. Respirations even and unlabored. Environment secured. Safety checks in place according to facility policy.

## 2024-02-22 NOTE — ED Notes (Signed)
 Patient in bedroom, calm and composed. No acute distress noted. No concerns voiced. Informed patient to notify staff with any needs or assistance. Patient verbalized understanding or agreement. Safety checks in place per facility policy.

## 2024-02-22 NOTE — Group Note (Signed)
 Group Topic: Wellness  Group Date: 02/22/2024 Start Time: 1200 End Time: 1220 Facilitators: Sherrin Stahle, Jacklynn Barnacle, RN  Department: [facility]  Number of Participants: 7  Group Focus: nutrition education Treatment Modality:  Interpersonal Therapy Interventions utilized were exploration and patient education Purpose: increase insight  Name: Kenneth Welch. Date of Birth: May 22, 1986  MR: [id number]    Level of Participation: active Quality of Participation: attentive and cooperative Interactions with others: gave feedback Mood/Affect: appropriate Triggers (if applicable): None identified Cognition: coherent/clear Progress: Gaining insight Response: Patient attended group and was cooperative throughout.  Plan: patient will be encouraged to continue to attend groups/programing.  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

## 2024-02-22 NOTE — ED Notes (Signed)
 Patient is sleeping. Respirations equal and unlabored, skin warm and dry. No change in assessment or acuity. Routine safety checks conducted according to facility protocol. Will continue to monitor for safety.

## 2024-02-22 NOTE — Discharge Instructions (Signed)
 Patient will be discharging to [facility] on Monday 02/26/2024 by 9:00am with transportation provided via Tax[employer]. Address is [address] [place], [place] [address].    [facility] [address], [place], [address] [phone] phone  New Patient Assessment/Therapy Walk-Ins:  Monday and Wednesday: 8 am until slots are full. Every 1st and 2nd Fridays of the month: 1 pm - 5 pm.  NO ASSESSMENT/THERAPY WALK-INS ON TUESDAYS OR THURSDAYS  New Patient Assessment/Medication Management Walk-Ins:  Monday - Friday:  8 am - 11 am.  For all walk-ins, we ask that you arrive by 7:30 am because patients will be seen in the order of arrival.  Availability is limited; therefore, you may not be seen on the same day that you walk-in.  Our goal is to serve and meet the needs of our community to the best of our [place] ability.  SUBSTANCE USE TREATMENT for Medicaid and State Funded/IPRS  [facility] [address], [place], [address] [phone] phone NOTE: [facility] is no longer offering IOP services.  Serves those who are low-income or have no insurance.  [facility] [address], [place], [place], [address] [phone] phone [phone] fax NOTE: Does have Substance Abuse-Intensive Outpatient Program [facility]) as well as transitional housing if eligible.  [facility] [address] [place], [place], [address] [phone] phone [phone] fax  [facility] [phone] [address] [place], [place], [address] [phone] phone [phone] fax  HALFWAY HOUSES:  [facility] [phone]  [employer].oxfordvacancies.com  12 STEP PROGRAMS:  Alcoholics Anonymous of [place] [employer].be  Narcotics Anonymous of [place] [employer]dk  Al-Anon of [place], [place] www.[place]alanon.org/find-meetings.html  Nar-Anon  https://nar-anon.org/find-a-meetin  List of Residential placements:   [facility] in [place]: [phone]  [facility]: [phone]  [place], [place] [phone]: Male and male facility; 30-day program: (uninsured and Medicaid such as Laurena Bering, [place], [place], partners)  [facility]: [phone]; men and women's facility; 28 days; Can have Medicaid tailored plan Tour manager or Partners)  [facility]: [phone] Karoline Caldwell or Larita Fife; 28 day program; must be fully detox; tailored Medicaid or no insurance  [address] in [place], [place]; [phone]; 28 day all males program; no insurance accepted  [facility] Referral in [place]: Gabriel Rung [phone] (no insurance or Medicaid only); 90 days; outpatient services but provide housing in apartments downtown [place]  [facility] Admission: [phone]: Patient must complete phone screening for placement: [place], [place]; 6 month program; uninsured, Medicaid, and [employer].   [facility]: no insurance required; [phone]  [facility]: [phone]; Intake: Molly Maduro; Must fill out application online; Alecia Lemming Delay [phone] x [address] [facility] in [place], [place]: [phone]; Admissions Coordinators Mr. Maurine Minister or Barron Alvine; 90 day program.  [facility]: [place], [place] [phone]; Co-Ed 9 month to a year program; Online application; Men entry fee is $500 (6-90months);  [employer]: [address] [place], [place] [address]; no fee or insurance required; minimum of 2 years; Highly structured; work based; Intake Coordinator is [name] [phone]  [facility] in [place], [place]: [phone]; Fax number is [phone]; website: www.[facility].org; Requires 3-6 page autobiography; 2 year program (18 months and then 65month transitional housing); Admission fee is $300; no insurance needed; work  Automotive engineer in [place], [place]: [employer] Desk Staff: Danise Edge [phone]: They have a Men's Regenerations Program 6-69months. Free program; There is an initial $300 fee however, they  are willing to work with patients regarding that. Application is online.  [facility]: Admissions [phone] Doran Heater ext [phone]; Any 7-90 day program is out of pocket; 12 month program is free of charge; there is a $275 entry fee; Patient is responsible for own transportation

## 2024-02-22 NOTE — ED Notes (Addendum)
 Pt in the bedroom calm and sleeping  NAD, respirations are even and unlabored.  Will continue to monitor for safety.

## 2024-02-22 NOTE — Discharge Planning (Signed)
 Referral was received and per Marcelino Duster, patient has been accepted and can transfer to the facility on Monday 02/26/2024 by 9:00am. Update to be provided to the patient and MD to be made aware. Patient will need a 7-14 day supply of medication and one month refill. No nicotine gum allowed, however 14-30 day nicotine patches to be provided if needed. No other needs to report at this time.     LCSW will continue to follow up and provide updates as received.    Fernande Boyden, LCSW Clinical Social Worker [facility] Ph: [phone]

## 2024-02-22 NOTE — ED Notes (Signed)
 Patient alert & oriented x4. Denies intent to harm self or others when asked. Denies A/VH. Patient denies any physical complaints when asked. No acute distress noted. Scheduled medications administered with no complications. Support and encouragement provided. Routine safety checks conducted per facility protocol. Encouraged patient to notify staff if any thoughts of harm towards self or others arise. Patient verbalizes understanding and agreement.

## 2024-02-22 NOTE — Group Note (Unsigned)
 Group Topic: Balance in Life  Group Date: 02/22/2024 Start Time: 1230 End Time: 1300 Facilitators: Concha Norway, NT  Department: [facility]  Number of Participants: 6  Group Focus: acceptance Treatment Modality:  Solution-Focused Therapy Interventions utilized were story telling Purpose: enhance coping skills   Name: Hanz Winterhalter. Date of Birth: 05-08-1986  MR: [id number]    Level of Participation: {THERAPIES; PSYCH GROUP PARTICIPATION WUJWJ:19147} Quality of Participation: {THERAPIES; PSYCH QUALITY OF PARTICIPATION:23992} Interactions with others: {THERAPIES; PSYCH INTERACTIONS:23993} Mood/Affect: {THERAPIES; PSYCH MOOD/AFFECT:23994} Triggers (if applicable): *** Cognition: {THERAPIES; PSYCH COGNITION:23995} Progress: {THERAPIES; PSYCH PROGRESS:23997} Response: *** Plan: {THERAPIES; PSYCH WGNF:62130}  Patients Problems:  Patient Active Problem List   Diagnosis Date Noted   Schizoaffective disorder, bipolar type (HCC) 02/19/2024   IVDU (intravenous drug user) 01/28/2024   Arm abscess 01/19/2024   Cellulitis 01/17/2024   PTSD (post-traumatic stress disorder) 11/29/2023   Methamphetamine use disorder, severe (HCC) 11/29/2023   Cannabis use disorder 11/29/2023   Long term current use of antipsychotic medication 11/29/2023   Schizoaffective disorder, depressive type (HCC) 11/27/2023   Homelessness 06/13/2023   Suicidal ideation 06/13/2023   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

## 2024-02-23 NOTE — ED Provider Notes (Signed)
 Behavioral Health Progress Note  Date and Time: 02/23/2024 3:55 PM Name: Kenneth Welch. MRN:  [id number]  Kenneth Welch. Is a 38 y/o male with a history of schizophrenia/schizoaffective disorder, post-traumatic stress disorder (PTSD), and methamphetamine abuse. Patient presented voluntarily to the [facility] on 02/19/2024 with complaint of suicidal ideations and was transferred to [facility] on the same day for crisis stabilization and detox. PMHx is significant for HIV, compliant on biktarvy. UDS on admission is positive for methamphetamine.   Subjective:   Patient was evaluated in his room, he believes his depression has been improving.  He reports no suicidal ideations today, reports he is currently tolerating his Wellbutrin.  He is reporting anxiety and distress related to the unknown whereabouts of his vehicle.  I provided the patient with additional phone numbers for towing services to contact in the [place] area.  He rates his depression today 6/10 compared to 8/10 yesterday, where 10 is most severe.  No SI/HI/AVH Diagnosis:  Final diagnoses:  Methamphetamine abuse (HCC)  Stimulant use disorder  IVDU (intravenous drug user)  Substance induced mood disorder (HCC)  Moderate episode of recurrent major depressive disorder (HCC)  Suicidal ideations    Total Time spent with patient: 30 minutes  Substance Use Hx: Alcohol: Denies Tobacco: Denies Cannabis: denies current use,reports he has not smoked in weeks Cocaine: denies Methamphetamines: reports he has been using since 89 initially smoking and later injecting it. He believes  he injects about 2 ccs. He uses twice a week. Route: IV Psilocybin (mushrooms): denies Ecstasy (MDMA / molly): denies LSD (acid): never tried: denies Opiates (fentanyl / heroin): denies Benzos (Xanax, Klonopin): denies IVDU: Yes, see above Rehab hx: Denies   Past Psychiatric Hx: Current Psychiatrist: No Current Therapist: No Previous  Psychiatric Diagnoses: Per pt --PTSD, schizoaffective d/o depressive type Current psychiatric medications: Abilify 10 mg (non-compliant, has not had access to these medications) Previous medication trials: prozac (stopped due to excess sedation), clonidine, benztropine (takes it in with abilify), zyprexa (pt describes dystonic reaction) Psychiatric Hospitalization hx: No Psychotherapy hx: reports he had CBT with a therapist when he lived in [place] Neuromodulation history: Denies History of suicide (obtained from HPI):reports 1 prior attempt in July 2024 via OD on combination of prescribed medications History of homicide or aggression (obtained in HPI): reports he was a aggressive as a child, denies any issues in adulthood   Past Medical History: PCP: sees PA at [facility] Dx:HIV Medications:biktarvy Allergies: denies Hospitalizations: Surgeries: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 Mother - liver cancer Father - liver cancer   Family Psychiatric History: Psychiatric WU:[id number] Suicide YN:[id number] Substance use:   Social History: Lives in [place] alone, lost his home prior to arriving here.  Reports his parents are both deceased. Patient has 2 sisters, but he is not in contact with them. Social Support: Denies Education: some college Occupational hx: unemployed, on SSI for schizphrenia Marital Status:Single Children:denies Legal:denies any upcoming court dates, has prior Nature conservation officer: No Access to firearms: No     Current Medications:  Current Facility-Administered Medications  Medication Dose Route Frequency Provider Last Rate Last Admin   acetaminophen (TYLENOL) tablet 650 mg  650 mg Oral Q6H PRN Carrion-Carrero, Asa Baudoin, MD       alum & mag hydroxide-simeth (MAALOX/MYLANTA) 200-200-20 MG/5ML suspension 30 mL  30 mL Oral Q4H PRN Lorri Frederick, MD  ARIPiprazole (ABILIFY) tablet 10 mg  10 mg Oral Daily Carrion-Carrero, Gerlean Cid, MD   10 mg at 02/23/24 1005   bictegravir-emtricitabine-tenofovir AF (BIKTARVY) 50-200-25 MG per tablet 1 tablet  1 tablet Oral Daily Carrion-Carrero, Juvon Teater, MD   1 tablet at 02/23/24 1004   buPROPion (WELLBUTRIN XL) 24 hr tablet 150 mg  150 mg Oral Daily Carrion-Carrero, Karle Starch, MD   150 mg at 02/23/24 1004   diphenhydrAMINE (BENADRYL) capsule 25 mg  25 mg Oral Q6H PRN Carrion-Carrero, Nihar Klus, MD       haloperidol (HALDOL) tablet 5 mg  5 mg Oral TID PRN Carrion-Carrero, Holt Woolbright, MD       And   diphenhydrAMINE (BENADRYL) capsule 50 mg  50 mg Oral TID PRN Carrion-Carrero, Luverne Shellhammer, MD       haloperidol lactate (HALDOL) injection 5 mg  5 mg Intramuscular TID PRN Carrion-Carrero, Brodyn Depuy, MD       And   diphenhydrAMINE (BENADRYL) injection 50 mg  50 mg Intramuscular TID PRN Carrion-Carrero, Bette Brienza, MD       And   LORazepam (ATIVAN) injection 2 mg  2 mg Intramuscular TID PRN Carrion-Carrero, Markise Haymer, MD       haloperidol lactate (HALDOL) injection 10 mg  10 mg Intramuscular TID PRN Carrion-Carrero, Prescott Truex, MD       And   diphenhydrAMINE (BENADRYL) injection 50 mg  50 mg Intramuscular TID PRN Carrion-Carrero, Satomi Buda, MD       And   LORazepam (ATIVAN) injection 2 mg  2 mg Intramuscular TID PRN Carrion-Carrero, Davinci Glotfelty, MD       fluticasone (FLONASE) 50 MCG/ACT nasal spray 2 spray  2 spray Each Nare Daily PRN Carrion-Carrero, Ceylon Arenson, MD       guaiFENesin (MUCINEX) 12 hr tablet 600 mg  600 mg Oral BID PRN Carrion-Carrero, Neeley Sedivy, MD       hydrOXYzine (ATARAX) tablet 25 mg  25 mg Oral TID PRN Carrion-Carrero, Fermina Mishkin, MD       loratadine (CLARITIN) tablet 10 mg  10 mg Oral Daily PRN Carrion-Carrero, Marquan Vokes, MD       magnesium hydroxide (MILK OF MAGNESIA) suspension 30 mL  30 mL Oral Daily PRN Carrion-Carrero, Khalia Gong, MD       naphazoline-pheniramine (NAPHCON-A) 0.025-0.3 % ophthalmic solution 2  drop  2 drop Both Eyes QID PRN Carrion-Carrero, Huntley Demedeiros, MD       traZODone (DESYREL) tablet 50 mg  50 mg Oral QHS PRN Carrion-Carrero, Meranda Dechaine, MD       Current Outpatient Medications  Medication Sig Dispense Refill   ARIPiprazole (ABILIFY) 10 MG tablet Take 10 mg by mouth daily.     bictegravir-emtricitabine-tenofovir AF (BIKTARVY) 50-200-25 MG TABS tablet Take by mouth daily.      Labs  Lab Results:  Admission on 02/21/2024  Component Date Value Ref Range Status   WBC 02/22/2024 5.7  4.0 - 10.5 K/uL Final   RBC 02/22/2024 4.30  4.22 - 5.81 MIL/uL Final   Hemoglobin 02/22/2024 13.4  13.0 - 17.0 g/dL Final   HCT 16/09/9603 40.3  39.0 - 52.0 % Final   MCV 02/22/2024 93.7  80.0 - 100.0 fL Final   MCH 02/22/2024 31.2  26.0 - 34.0 pg Final   MCHC 02/22/2024 33.3  30.0 - 36.0 g/dL Final   RDW 54/08/8118 14.3  11.5 - 15.5 % Final   Platelets 02/22/2024 214  150 - 400 K/uL Final   nRBC 02/22/2024 0.0  0.0 - 0.2 % Final   Neutrophils Relative % 02/22/2024 42  % Final  Neutro Abs 02/22/2024 2.4  1.7 - 7.7 K/uL Final   Lymphocytes Relative 02/22/2024 50  % Final   Lymphs Abs 02/22/2024 2.8  0.7 - 4.0 K/uL Final   Monocytes Relative 02/22/2024 5  % Final   Monocytes Absolute 02/22/2024 0.3  0.1 - 1.0 K/uL Final   Eosinophils Relative 02/22/2024 3  % Final   Eosinophils Absolute 02/22/2024 0.2  0.0 - 0.5 K/uL Final   Basophils Relative 02/22/2024 0  % Final   Basophils Absolute 02/22/2024 0.0  0.0 - 0.1 K/uL Final   Immature Granulocytes 02/22/2024 0  % Final   Abs Immature Granulocytes 02/22/2024 0.01  0.00 - 0.07 K/uL Final   Performed at [facility], [address] [place], [place] [address]   Sodium 02/22/2024 135  135 - 145 mmol/L Final   Potassium 02/22/2024 3.8  3.5 - 5.1 mmol/L Final   Chloride 02/22/2024 105  98 - 111 mmol/L Final   CO2 02/22/2024 26  22 - 32 mmol/L Final   Glucose, Bld 02/22/2024 89  70 - 99 mg/dL Final   Glucose reference range applies only to  samples taken after fasting for at least 8 hours.   BUN 02/22/2024 22 (H)  6 - 20 mg/dL Final   Creatinine, Ser 02/22/2024 1.13  0.61 - 1.24 mg/dL Final   Calcium 09/81/1914 8.5 (L)  8.9 - 10.3 mg/dL Final   Total Protein 78/29/5621 6.6  6.5 - 8.1 g/dL Final   Albumin 30/86/5784 3.2 (L)  3.5 - 5.0 g/dL Final   AST 69/62/9528 14 (L)  15 - 41 U/L Final   ALT 02/22/2024 15  0 - 44 U/L Final   Alkaline Phosphatase 02/22/2024 42  38 - 126 U/L Final   Total Bilirubin 02/22/2024 0.4  0.0 - 1.2 mg/dL Final   GFR, Estimated 02/22/2024 >60  >60 mL/min Final   Comment: (NOTE) Calculated using the CKD-EPI Creatinine Equation (2021)    Anion gap 02/22/2024 4 (L)  5 - 15 Final   Performed at [facility], [address], [place], [place] [address]   TSH 02/22/2024 2.786  0.350 - 4.500 uIU/mL Final   Comment: Performed by a 3rd Generation assay with a functional sensitivity of <=0.01 uIU/mL. Performed at [facility], [address], [place], [place] [address]    RPR Ser Ql 02/22/2024 NON REACTIVE  NON REACTIVE Final   Performed at [facility], [address] [place], [place] [address]   Hepatitis B Surface Ag 02/22/2024 NON REACTIVE  NON REACTIVE Final   HCV Ab 02/22/2024 NON REACTIVE  NON REACTIVE Final   Comment: (NOTE) Nonreactive HCV antibody screen is consistent with no HCV infections,  unless recent infection is suspected or other evidence exists to indicate HCV infection.     Hep A IgM 02/22/2024 NON REACTIVE  NON REACTIVE Final   Hep B C IgM 02/22/2024 NON REACTIVE  NON REACTIVE Final   Performed at [facility], [address], [place], [place] [address]  Admission on 02/19/2024, Discharged on 02/21/2024  Component Date Value Ref Range Status   Glucose-Capillary 02/21/2024 90  70 - 99 mg/dL Final   Glucose reference range applies only to samples taken after fasting for at least 8 hours.  Admission on 02/18/2024, Discharged on 02/19/2024  Component Date Value  Ref Range Status   WBC 02/18/2024 6.6  4.0 - 10.5 K/uL Final   RBC 02/18/2024 4.78  4.22 - 5.81  MIL/uL Final   Hemoglobin 02/18/2024 15.0  13.0 - 17.0 g/dL Final   HCT 16/09/9603 45.0  39.0 - 52.0 % Final   MCV 02/18/2024 94.1  80.0 - 100.0 fL Final   MCH 02/18/2024 31.4  26.0 - 34.0 pg Final   MCHC 02/18/2024 33.3  30.0 - 36.0 g/dL Final   RDW 54/08/8118 14.5  11.5 - 15.5 % Final   Platelets 02/18/2024 238  150 - 400 K/uL Final   nRBC 02/18/2024 0.0  0.0 - 0.2 % Final   Neutrophils Relative % 02/18/2024 57  % Final   Neutro Abs 02/18/2024 3.8  1.7 - 7.7 K/uL Final   Lymphocytes Relative 02/18/2024 31  % Final   Lymphs Abs 02/18/2024 2.0  0.7 - 4.0 K/uL Final   Monocytes Relative 02/18/2024 9  % Final   Monocytes Absolute 02/18/2024 0.6  0.1 - 1.0 K/uL Final   Eosinophils Relative 02/18/2024 2  % Final   Eosinophils Absolute 02/18/2024 0.1  0.0 - 0.5 K/uL Final   Basophils Relative 02/18/2024 1  % Final   Basophils Absolute 02/18/2024 0.0  0.0 - 0.1 K/uL Final   Immature Granulocytes 02/18/2024 0  % Final   Abs Immature Granulocytes 02/18/2024 0.02  0.00 - 0.07 K/uL Final   Performed at [facility], [address] [place], [place] [address]   Sodium 02/18/2024 136  135 - 145 mmol/L Final   Potassium 02/18/2024 4.2  3.5 - 5.1 mmol/L Final   Chloride 02/18/2024 100  98 - 111 mmol/L Final   CO2 02/18/2024 28  22 - 32 mmol/L Final   Glucose, Bld 02/18/2024 141 (H)  70 - 99 mg/dL Final   Glucose reference range applies only to samples taken after fasting for at least 8 hours.   BUN 02/18/2024 15  6 - 20 mg/dL Final   Creatinine, Ser 02/18/2024 1.09  0.61 - 1.24 mg/dL Final   Calcium 95/62/1308 9.6  8.9 - 10.3 mg/dL Final   Total Protein 65/78/4696 7.9  6.5 - 8.1 g/dL Final   Albumin 29/52/8413 4.0  3.5 - 5.0 g/dL Final   AST 24/40/1027 19  15 - 41 U/L Final   ALT 02/18/2024 21  0 - 44 U/L Final   Alkaline Phosphatase 02/18/2024 54  38 - 126 U/L Final   Total Bilirubin  02/18/2024 0.6  0.0 - 1.2 mg/dL Final   GFR, Estimated 02/18/2024 >60  >60 mL/min Final   Comment: (NOTE) Calculated using the CKD-EPI Creatinine Equation (2021)    Anion gap 02/18/2024 8  5 - 15 Final   Performed at [facility], [address] [place], [place] [address]   Hgb A1c MFr Bld 02/18/2024 5.6  4.8 - 5.6 % Final   Comment: (NOTE) Pre diabetes:          5.7%-6.4%  Diabetes:              >6.4%  Glycemic control for   <7.0% adults with diabetes    Mean Plasma Glucose 02/18/2024 114.02  mg/dL Final   Performed at [facility], [address], [place], [place] [address]   Alcohol, Ethyl (B) 02/18/2024 <10  <10 mg/dL Final   Comment: (NOTE) Lowest detectable limit for serum alcohol is 10 mg/dL.  For medical purposes only. Performed at [facility], [address], [place], [place] [address]    Cholesterol 02/18/2024 154  0 - 200 mg/dL Final   Triglycerides 56/38/7564 63  <  150 mg/dL Final   HDL 56/38/7564 47  >40 mg/dL Final   Total CHOL/HDL Ratio 02/18/2024 3.3  RATIO Final   VLDL 02/18/2024 13  0 - 40 mg/dL Final   LDL Cholesterol 02/18/2024 94  0 - 99 mg/dL Final   Comment:        Total Cholesterol/HDL:CHD Risk Coronary Heart Disease Risk Table                     Men   Women  1/2 Average Risk   3.4   3.3  Average Risk       5.0   4.4  2 X Average Risk   9.6   7.1  3 X Average Risk  23.4   11.0        Use the calculated Patient Ratio above and the CHD Risk Table to determine the patient's CHD Risk.        ATP III CLASSIFICATION (LDL):  <100     mg/dL   Optimal  332-951  mg/dL   Near or Above                    Optimal  130-159  mg/dL   Borderline  884-166  mg/dL   High  >063     mg/dL   Very High Performed at [facility], [address] [place], [place] [address]    TSH 02/18/2024 3.451  0.350 - 4.500 uIU/mL Final   Comment: Performed by a 3rd Generation assay with a functional sensitivity of <=0.01 uIU/mL. Performed at [facility], [address] [place], [place] [address]    POC Amphetamine UR 02/18/2024 None Detected  NONE DETECTED (Cut Off Level 1000 ng/mL) Final   POC Secobarbital (BAR) 02/18/2024 None Detected  NONE DETECTED (Cut Off Level 300 ng/mL) Final   POC Buprenorphine (BUP) 02/18/2024 None Detected  NONE DETECTED (Cut Off Level 10 ng/mL) Final   POC Oxazepam (BZO) 02/18/2024 None Detected  NONE DETECTED (Cut Off Level 300 ng/mL) Final   POC Cocaine UR 02/18/2024 None Detected  NONE DETECTED (Cut Off Level 300 ng/mL) Final   POC Methamphetamine UR 02/18/2024 Positive (A)  NONE DETECTED (Cut Off Level 1000 ng/mL) Final   POC Morphine 02/18/2024 None Detected  NONE DETECTED (Cut Off Level 300 ng/mL) Final   POC Methadone UR 02/18/2024 None Detected  NONE DETECTED (Cut Off Level 300 ng/mL) Final   POC Oxycodone UR 02/18/2024 None Detected  NONE DETECTED (Cut Off Level 100 ng/mL) Final   POC Marijuana UR 02/18/2024 None Detected  NONE DETECTED (Cut Off Level 50 ng/mL) Final  Admission on 01/16/2024, Discharged on 01/19/2024  Component Date Value Ref Range Status   Sodium 01/16/2024 136  135 - 145 mmol/L Final   Potassium 01/16/2024 4.2  3.5 - 5.1 mmol/L Final   Chloride 01/16/2024 101  98 - 111 mmol/L Final   CO2 01/16/2024 25  22 - 32 mmol/L Final   Glucose, Bld 01/16/2024 86  70 - 99 mg/dL Final   Glucose reference range applies only to samples taken after fasting for at least 8 hours.   BUN 01/16/2024 11  6 - 20 mg/dL Final   Creatinine, Ser 01/16/2024 1.07  0.61 - 1.24 mg/dL Final   Calcium 55/73/2202 9.0  8.9 - 10.3 mg/dL Final   Total Protein 54/27/0623 7.6  6.5 - 8.1 g/dL Final   Albumin 76/28/3151 3.7  3.5 - 5.0 g/dL Final  AST 01/16/2024 20  15 - 41 U/L Final   ALT 01/16/2024 18  0 - 44 U/L Final   Alkaline Phosphatase 01/16/2024 51  38 - 126 U/L Final   Total Bilirubin 01/16/2024 0.4  0.0 - 1.2 mg/dL Final   GFR, Estimated 01/16/2024 >60  >60 mL/min Final   Comment:  (NOTE) Calculated using the CKD-EPI Creatinine Equation (2021)    Anion gap 01/16/2024 10  5 - 15 Final   Performed at [facility], [address], [place], [place] [address]   Lactic Acid, Venous 01/16/2024 1.2  0.5 - 1.9 mmol/L Final   WBC 01/16/2024 9.0  4.0 - 10.5 K/uL Final   RBC 01/16/2024 4.42  4.22 - 5.81 MIL/uL Final   Hemoglobin 01/16/2024 13.7  13.0 - 17.0 g/dL Final   HCT 64/40/3474 41.4  39.0 - 52.0 % Final   MCV 01/16/2024 93.7  80.0 - 100.0 fL Final   MCH 01/16/2024 31.0  26.0 - 34.0 pg Final   MCHC 01/16/2024 33.1  30.0 - 36.0 g/dL Final   RDW 25/95/6387 14.0  11.5 - 15.5 % Final   Platelets 01/16/2024 302  150 - 400 K/uL Final   nRBC 01/16/2024 0.0  0.0 - 0.2 % Final   Neutrophils Relative % 01/16/2024 59  % Final   Neutro Abs 01/16/2024 5.2  1.7 - 7.7 K/uL Final   Lymphocytes Relative 01/16/2024 31  % Final   Lymphs Abs 01/16/2024 2.8  0.7 - 4.0 K/uL Final   Monocytes Relative 01/16/2024 6  % Final   Monocytes Absolute 01/16/2024 0.5  0.1 - 1.0 K/uL Final   Eosinophils Relative 01/16/2024 4  % Final   Eosinophils Absolute 01/16/2024 0.3  0.0 - 0.5 K/uL Final   Basophils Relative 01/16/2024 0  % Final   Basophils Absolute 01/16/2024 0.0  0.0 - 0.1 K/uL Final   Immature Granulocytes 01/16/2024 0  % Final   Abs Immature Granulocytes 01/16/2024 0.04  0.00 - 0.07 K/uL Final   Performed at [facility], [address] [place], [place] [address]   Prothrombin Time 01/16/2024 13.4  11.4 - 15.2 seconds Final   INR 01/16/2024 1.0  0.8 - 1.2 Final   Comment: (NOTE) INR goal varies based on device and disease states. Performed at [facility], [address] [place], [place] [address]    Specimen Description 01/16/2024 BLOOD RIGHT ARM   Final   Special Requests 01/16/2024 BOTTLES DRAWN AEROBIC AND ANAEROBIC Blood Culture adequate volume   Final   Culture 01/16/2024    Final                   Value:NO GROWTH 5 DAYS Performed at [facility], [address], [place], [place] [address]    Report Status 01/16/2024 01/22/2024 FINAL   Final   Specimen Description 01/16/2024 BLOOD RIGHT HAND   Final   Special Requests 01/16/2024 BOTTLES DRAWN AEROBIC AND ANAEROBIC Blood Culture adequate volume   Final   Culture  Setup Time 01/16/2024    Final                   Value:GRAM POSITIVE COCCI IN CLUSTERS ANAEROBIC BOTTLE ONLY CRITICAL RESULT CALLED TO, READ BACK BY AND VERIFIED WITH: PHARMD K. HURTH [id number] @ 2121 FH    Culture 01/16/2024  (A)   Final  Value:STAPHYLOCOCCUS EPIDERMIDIS THE SIGNIFICANCE OF ISOLATING THIS ORGANISM FROM A SINGLE SET OF BLOOD CULTURES WHEN MULTIPLE SETS ARE DRAWN IS UNCERTAIN. PLEASE NOTIFY THE MICROBIOLOGY DEPARTMENT WITHIN ONE WEEK IF SPECIATION AND SENSITIVITIES ARE REQUIRED. Performed at [facility], [address], [place], [place] [address]    Report Status 01/16/2024 01/19/2024 FINAL   Final   CD4 T Cell Abs 01/17/2024 919  400 - 1,790 /uL Final   CD4 % Helper T Cell 01/17/2024 36  33 - 65 % Final   Performed at [facility], [address] [place], [place] [address]   WBC 01/17/2024 7.9  4.0 - 10.5 K/uL Final   RBC 01/17/2024 3.98 (L)  4.22 - 5.81 MIL/uL Final   Hemoglobin 01/17/2024 12.4 (L)  13.0 - 17.0 g/dL Final   HCT 09/81/1914 37.7 (L)  39.0 - 52.0 % Final   MCV 01/17/2024 94.7  80.0 - 100.0 fL Final   MCH 01/17/2024 31.2  26.0 - 34.0 pg Final   MCHC 01/17/2024 32.9  30.0 - 36.0 g/dL Final   RDW 78/29/5621 13.8  11.5 - 15.5 % Final   Platelets 01/17/2024 264  150 - 400 K/uL Final   nRBC 01/17/2024 0.0  0.0 - 0.2 % Final   Performed at [facility], [address] [place], [place] [address]   Creatinine, Ser 01/17/2024 1.22  0.61 - 1.24 mg/dL Final   GFR, Estimated 01/17/2024 >60  >60 mL/min Final   Comment: (NOTE) Calculated using the CKD-EPI Creatinine Equation (2021) Performed at [facility], [address] [place],  [place] [address]    WBC 01/18/2024 4.7  4.0 - 10.5 K/uL Final   RBC 01/18/2024 4.45  4.22 - 5.81 MIL/uL Final   Hemoglobin 01/18/2024 13.7  13.0 - 17.0 g/dL Final   HCT 62/95/2841 41.0  39.0 - 52.0 % Final   MCV 01/18/2024 92.1  80.0 - 100.0 fL Final   MCH 01/18/2024 30.8  26.0 - 34.0 pg Final   MCHC 01/18/2024 33.4  30.0 - 36.0 g/dL Final   RDW 32/44/0102 13.6  11.5 - 15.5 % Final   Platelets 01/18/2024 272  150 - 400 K/uL Final   nRBC 01/18/2024 0.0  0.0 - 0.2 % Final   Neutrophils Relative % 01/18/2024 43  % Final   Neutro Abs 01/18/2024 2.0  1.7 - 7.7 K/uL Final   Lymphocytes Relative 01/18/2024 43  % Final   Lymphs Abs 01/18/2024 2.0  0.7 - 4.0 K/uL Final   Monocytes Relative 01/18/2024 6  % Final   Monocytes Absolute 01/18/2024 0.3  0.1 - 1.0 K/uL Final   Eosinophils Relative 01/18/2024 7  % Final   Eosinophils Absolute 01/18/2024 0.3  0.0 - 0.5 K/uL Final   Basophils Relative 01/18/2024 1  % Final   Basophils Absolute 01/18/2024 0.0  0.0 - 0.1 K/uL Final   Immature Granulocytes 01/18/2024 0  % Final   Abs Immature Granulocytes 01/18/2024 0.00  0.00 - 0.07 K/uL Final   Performed at [facility], [address], [place], [place] [address]   Sodium 01/18/2024 138  135 - 145 mmol/L Final   Potassium 01/18/2024 4.1  3.5 - 5.1 mmol/L Final   Chloride 01/18/2024 101  98 - 111 mmol/L Final   CO2 01/18/2024 25  22 - 32 mmol/L Final   Glucose, Bld 01/18/2024 89  70 - 99 mg/dL Final   Glucose reference range applies only to samples taken after  fasting for at least 8 hours.   BUN 01/18/2024 13  6 - 20 mg/dL Final   Creatinine, Ser 01/18/2024 1.16  0.61 - 1.24 mg/dL Final   Calcium 04/54/0981 9.3  8.9 - 10.3 mg/dL Final   GFR, Estimated 01/18/2024 >60  >60 mL/min Final   Comment: (NOTE) Calculated using the CKD-EPI Creatinine Equation (2021)    Anion gap 01/18/2024 12  5 - 15 Final   Performed at [facility], [address], [place], [place] [address]   Enterococcus faecalis  01/16/2024 NOT DETECTED  NOT DETECTED Final   Enterococcus Faecium 01/16/2024 NOT DETECTED  NOT DETECTED Final   Listeria monocytogenes 01/16/2024 NOT DETECTED  NOT DETECTED Final   Staphylococcus species 01/16/2024 DETECTED (A)  NOT DETECTED Final   Comment: CRITICAL RESULT CALLED TO, READ BACK BY AND VERIFIED WITH: PHARMD K. HURTH [id number] @2121  FH    Staphylococcus aureus (BCID) 01/16/2024 NOT DETECTED  NOT DETECTED Final   Staphylococcus epidermidis 01/16/2024 DETECTED (A)  NOT DETECTED Final   Comment: CRITICAL RESULT CALLED TO, READ BACK BY AND VERIFIED WITH: PHARMD K. HURTH [id number] @2121  FH    Staphylococcus lugdunensis 01/16/2024 NOT DETECTED  NOT DETECTED Final   Streptococcus species 01/16/2024 NOT DETECTED  NOT DETECTED Final   Streptococcus agalactiae 01/16/2024 NOT DETECTED  NOT DETECTED Final   Streptococcus pneumoniae 01/16/2024 NOT DETECTED  NOT DETECTED Final   Streptococcus pyogenes 01/16/2024 NOT DETECTED  NOT DETECTED Final   A.calcoaceticus-baumannii 01/16/2024 NOT DETECTED  NOT DETECTED Final   Bacteroides fragilis 01/16/2024 NOT DETECTED  NOT DETECTED Final   Enterobacterales 01/16/2024 NOT DETECTED  NOT DETECTED Final   Enterobacter cloacae complex 01/16/2024 NOT DETECTED  NOT DETECTED Final   Escherichia coli 01/16/2024 NOT DETECTED  NOT DETECTED Final   Klebsiella aerogenes 01/16/2024 NOT DETECTED  NOT DETECTED Final   Klebsiella oxytoca 01/16/2024 NOT DETECTED  NOT DETECTED Final   Klebsiella pneumoniae 01/16/2024 NOT DETECTED  NOT DETECTED Final   Proteus species 01/16/2024 NOT DETECTED  NOT DETECTED Final   Salmonella species 01/16/2024 NOT DETECTED  NOT DETECTED Final   Serratia marcescens 01/16/2024 NOT DETECTED  NOT DETECTED Final   Haemophilus influenzae 01/16/2024 NOT DETECTED  NOT DETECTED Final   Neisseria meningitidis 01/16/2024 NOT DETECTED  NOT DETECTED Final   Pseudomonas aeruginosa 01/16/2024 NOT DETECTED  NOT DETECTED Final   Stenotrophomonas  maltophilia 01/16/2024 NOT DETECTED  NOT DETECTED Final   Candida albicans 01/16/2024 NOT DETECTED  NOT DETECTED Final   Candida auris 01/16/2024 NOT DETECTED  NOT DETECTED Final   Candida glabrata 01/16/2024 NOT DETECTED  NOT DETECTED Final   Candida krusei 01/16/2024 NOT DETECTED  NOT DETECTED Final   Candida parapsilosis 01/16/2024 NOT DETECTED  NOT DETECTED Final   Candida tropicalis 01/16/2024 NOT DETECTED  NOT DETECTED Final   Cryptococcus neoformans/gattii 01/16/2024 NOT DETECTED  NOT DETECTED Final   Methicillin resistance mecA/C 01/16/2024 NOT DETECTED  NOT DETECTED Final   Performed at [facility], [address], [place], [place] [address]   Total lymphocyte count 01/18/2024 2,126  1,000 - 4,000 /uL Final   CD4% 01/18/2024 39.16  33 - 65 % Final   CD4 absolute 01/18/2024 833  400 - 1,790 /uL Final   CD8tox 01/18/2024 34.88  12 - 40 % Final   CD8 T Cell Abs 01/18/2024 742  190 - 1,000 /uL Final   Ratio 01/18/2024 1.12  1.0 - 3.0 Final   Performed at [facility], [address] [place], [place] [address]  Admission on 11/27/2023, Discharged on 12/01/2023  Component Date Value Ref Range Status   HIV 1 RNA Quant 11/29/2023 12,600  copies/mL Corrected   Comment: (NOTE) The reportable range for this assay is 20 to 10,000,000 copies HIV-1 RNA/mL.    LOG10 HIV-1 RNA 11/29/2023 4.100  log10copy/mL Final   Comment: (NOTE) Performed At: [facility] [address] [place], [place] [id number] Jolene Schimke MD UX:[id number]    Neisseria Gonorrhea 11/30/2023 Negative   Final   Chlamydia 11/30/2023 Negative   Final   Trichomonas 11/30/2023 Negative   Final   Comment 11/30/2023 Normal Reference Ranger Chlamydia - Negative   Final   Comment 11/30/2023 Normal Reference Range Neisseria Gonorrhea - Negative   Final   Comment 11/30/2023 Normal Reference Range Trichomonas - Negative   Final  Admission on 11/26/2023, Discharged on 11/27/2023  Component  Date Value Ref Range Status   WBC 11/26/2023 6.5  4.0 - 10.5 K/uL Final   RBC 11/26/2023 4.17 (L)  4.22 - 5.81 MIL/uL Final   Hemoglobin 11/26/2023 12.7 (L)  13.0 - 17.0 g/dL Final   HCT 53/66/4403 38.7 (L)  39.0 - 52.0 % Final   MCV 11/26/2023 92.8  80.0 - 100.0 fL Final   MCH 11/26/2023 30.5  26.0 - 34.0 pg Final   MCHC 11/26/2023 32.8  30.0 - 36.0 g/dL Final   RDW 47/42/5956 14.4  11.5 - 15.5 % Final   Platelets 11/26/2023 229  150 - 400 K/uL Final   nRBC 11/26/2023 0.0  0.0 - 0.2 % Final   Neutrophils Relative % 11/26/2023 55  % Final   Neutro Abs 11/26/2023 3.6  1.7 - 7.7 K/uL Final   Lymphocytes Relative 11/26/2023 32  % Final   Lymphs Abs 11/26/2023 2.1  0.7 - 4.0 K/uL Final   Monocytes Relative 11/26/2023 7  % Final   Monocytes Absolute 11/26/2023 0.4  0.1 - 1.0 K/uL Final   Eosinophils Relative 11/26/2023 5  % Final   Eosinophils Absolute 11/26/2023 0.3  0.0 - 0.5 K/uL Final   Basophils Relative 11/26/2023 1  % Final   Basophils Absolute 11/26/2023 0.0  0.0 - 0.1 K/uL Final   Immature Granulocytes 11/26/2023 0  % Final   Abs Immature Granulocytes 11/26/2023 0.01  0.00 - 0.07 K/uL Final   Performed at [facility], [address] [place], [place] [address]   Sodium 11/26/2023 140  135 - 145 mmol/L Final   Potassium 11/26/2023 4.2  3.5 - 5.1 mmol/L Final   Chloride 11/26/2023 111  98 - 111 mmol/L Final   CO2 11/26/2023 25  22 - 32 mmol/L Final   Glucose, Bld 11/26/2023 86  70 - 99 mg/dL Final   Glucose reference range applies only to samples taken after fasting for at least 8 hours.   BUN 11/26/2023 11  6 - 20 mg/dL Final   Creatinine, Ser 11/26/2023 0.93  0.61 - 1.24 mg/dL Final   Calcium 43/32/9518 9.0  8.9 - 10.3 mg/dL Final   Total Protein 84/16/6063 6.5  6.5 - 8.1 g/dL Final   Albumin 01/60/1093 3.6  3.5 - 5.0 g/dL Final   AST 23/55/7322 36  15 - 41 U/L Final   ALT 11/26/2023 25  0 - 44 U/L Final   Alkaline Phosphatase 11/26/2023 51  38 - 126 U/L Final   Total  Bilirubin 11/26/2023 0.5  <1.2 mg/dL Final   GFR, Estimated 11/26/2023 >60  >60 mL/min Final  Comment: (NOTE) Calculated using the CKD-EPI Creatinine Equation (2021)    Anion gap 11/26/2023 4 (L)  5 - 15 Final   Performed at [facility], [address] [place], [place] [address]   Alcohol, Ethyl (B) 11/26/2023 <10  <10 mg/dL Final   Comment: (NOTE) Lowest detectable limit for serum alcohol is 10 mg/dL.  For medical purposes only. Performed at [facility], [address] [place], [place] [address]    POC Amphetamine UR 11/26/2023 None Detected  NONE DETECTED (Cut Off Level 1000 ng/mL) Final   POC Secobarbital (BAR) 11/26/2023 None Detected  NONE DETECTED (Cut Off Level 300 ng/mL) Final   POC Buprenorphine (BUP) 11/26/2023 None Detected  NONE DETECTED (Cut Off Level 10 ng/mL) Final   POC Oxazepam (BZO) 11/26/2023 None Detected  NONE DETECTED (Cut Off Level 300 ng/mL) Final   POC Cocaine UR 11/26/2023 None Detected  NONE DETECTED (Cut Off Level 300 ng/mL) Final   POC Methamphetamine UR 11/26/2023 None Detected  NONE DETECTED (Cut Off Level 1000 ng/mL) Final   POC Morphine 11/26/2023 None Detected  NONE DETECTED (Cut Off Level 300 ng/mL) Final   POC Methadone UR 11/26/2023 None Detected  NONE DETECTED (Cut Off Level 300 ng/mL) Final   POC Oxycodone UR 11/26/2023 None Detected  NONE DETECTED (Cut Off Level 100 ng/mL) Final   POC Marijuana UR 11/26/2023 None Detected  NONE DETECTED (Cut Off Level 50 ng/mL) Final   Cholesterol 11/26/2023 148  0 - 200 mg/dL Final   Triglycerides 09/81/1914 65  <150 mg/dL Final   HDL 78/29/5621 43  >40 mg/dL Final   Total CHOL/HDL Ratio 11/26/2023 3.4  RATIO Final   VLDL 11/26/2023 13  0 - 40 mg/dL Final   LDL Cholesterol 11/26/2023 92  0 - 99 mg/dL Final   Comment:        Total Cholesterol/HDL:CHD Risk Coronary Heart Disease Risk Table                     Men   Women  1/2 Average Risk   3.4   3.3  Average Risk       5.0   4.4  2 X  Average Risk   9.6   7.1  3 X Average Risk  23.4   11.0        Use the calculated Patient Ratio above and the CHD Risk Table to determine the patient's CHD Risk.        ATP III CLASSIFICATION (LDL):  <100     mg/dL   Optimal  308-657  mg/dL   Near or Above                    Optimal  130-159  mg/dL   Borderline  846-962  mg/dL   High  >952     mg/dL   Very High Performed at [facility], [address], [place], [place] [address]   Admission on 10/23/2023, Discharged on 10/23/2023  Component Date Value Ref Range Status   Sodium 10/23/2023 134 (L)  135 - 145 mmol/L Final   Potassium 10/23/2023 4.4  3.5 - 5.1 mmol/L Final   Chloride 10/23/2023 106  98 - 111 mmol/L Final   CO2 10/23/2023 18 (L)  22 - 32 mmol/L Final   Glucose, Bld 10/23/2023 87  70 - 99 mg/dL Final   Glucose reference range applies only to samples taken after fasting for at least 8 hours.  BUN 10/23/2023 10  6 - 20 mg/dL Final   Creatinine, Ser 10/23/2023 1.06  0.61 - 1.24 mg/dL Final   Calcium 40/98/1191 9.1  8.9 - 10.3 mg/dL Final   Total Protein 47/82/9562 7.6  6.5 - 8.1 g/dL Final   Albumin 13/07/6577 3.9  3.5 - 5.0 g/dL Final   AST 46/96/2952 29  15 - 41 U/L Final   ALT 10/23/2023 16  0 - 44 U/L Final   Alkaline Phosphatase 10/23/2023 51  38 - 126 U/L Final   Total Bilirubin 10/23/2023 0.6  <1.2 mg/dL Final   GFR, Estimated 10/23/2023 >60  >60 mL/min Final   Comment: (NOTE) Calculated using the CKD-EPI Creatinine Equation (2021)    Anion gap 10/23/2023 10  5 - 15 Final   Performed at [facility], [address], [place], [place] [address]   Alcohol, Ethyl (B) 10/23/2023 <10  <10 mg/dL Final   Comment: (NOTE) Lowest detectable limit for serum alcohol is 10 mg/dL.  For medical purposes only. Performed at [facility], [address], [place], [place] [address]    Opiates 10/23/2023 NONE DETECTED  NONE DETECTED Final   Cocaine 10/23/2023 NONE DETECTED  NONE DETECTED Final    Benzodiazepines 10/23/2023 NONE DETECTED  NONE DETECTED Final   Amphetamines 10/23/2023 NONE DETECTED  NONE DETECTED Final   Tetrahydrocannabinol 10/23/2023 NONE DETECTED  NONE DETECTED Final   Barbiturates 10/23/2023 NONE DETECTED  NONE DETECTED Final   Comment: (NOTE) DRUG SCREEN FOR MEDICAL PURPOSES ONLY.  IF CONFIRMATION IS NEEDED FOR ANY PURPOSE, NOTIFY LAB WITHIN 5 DAYS.  LOWEST DETECTABLE LIMITS FOR URINE DRUG SCREEN Drug Class                     Cutoff (ng/mL) Amphetamine and metabolites    1000 Barbiturate and metabolites    200 Benzodiazepine                 200 Opiates and metabolites        300 Cocaine and metabolites        300 THC                            50 Performed at [facility], [address] [place], [place] [address]    WBC 10/23/2023 4.8  4.0 - 10.5 K/uL Final   RBC 10/23/2023 4.96  4.22 - 5.81 MIL/uL Final   Hemoglobin 10/23/2023 15.2  13.0 - 17.0 g/dL Final   HCT 66/44/0347 46.2  39.0 - 52.0 % Final   MCV 10/23/2023 93.1  80.0 - 100.0 fL Final   MCH 10/23/2023 30.6  26.0 - 34.0 pg Final   MCHC 10/23/2023 32.9  30.0 - 36.0 g/dL Final   RDW 42/59/5638 14.4  11.5 - 15.5 % Final   Platelets 10/23/2023 225  150 - 400 K/uL Final   nRBC 10/23/2023 0.0  0.0 - 0.2 % Final   Neutrophils Relative % 10/23/2023 40  % Final   Neutro Abs 10/23/2023 1.9  1.7 - 7.7 K/uL Final   Lymphocytes Relative 10/23/2023 44  % Final   Lymphs Abs 10/23/2023 2.2  0.7 - 4.0 K/uL Final   Monocytes Relative 10/23/2023 9  % Final   Monocytes Absolute 10/23/2023 0.4  0.1 - 1.0 K/uL Final   Eosinophils Relative 10/23/2023 6  % Final   Eosinophils Absolute 10/23/2023 0.3  0.0 - 0.5 K/uL Final  Basophils Relative 10/23/2023 1  % Final   Basophils Absolute 10/23/2023 0.0  0.0 - 0.1 K/uL Final   Immature Granulocytes 10/23/2023 0  % Final   Abs Immature Granulocytes 10/23/2023 0.01  0.00 - 0.07 K/uL Final   Performed at [facility], [address], [place], [place]  [address]   Total lymphocyte count 10/23/2023 2,194  1,000 - 4,000 /uL Final   CD4% 10/23/2023 26.20 (L)  33 - 65 % Final   CD4 absolute 10/23/2023 575  400 - 1,790 /uL Final   CD8tox 10/23/2023 34.30  12 - 40 % Final   CD8 T Cell Abs 10/23/2023 753  190 - 1,000 /uL Final   Ratio 10/23/2023 0.76 (L)  1.0 - 3.0 Final   Performed at [facility], [address] [place], [place] [address]    Blood Alcohol level:  Lab Results  Component Value Date   ETH <10 02/18/2024   ETH <10 11/26/2023    Metabolic Disorder Labs: Lab Results  Component Value Date   HGBA1C 5.6 02/18/2024   MPG 114.02 02/18/2024   MPG 122.63 06/13/2023   Lab Results  Component Value Date   PROLACTIN 19.4 06/13/2023   Lab Results  Component Value Date   CHOL 154 02/18/2024   TRIG 63 02/18/2024   HDL 47 02/18/2024   CHOLHDL 3.3 02/18/2024   VLDL 13 02/18/2024   LDLCALC 94 02/18/2024   LDLCALC 92 11/26/2023    Therapeutic Lab Levels: No results found for: "LITHIUM" No results found for: "VALPROATE" No results found for: "CBMZ"  Physical Findings   AIMS    Flowsheet Row [facility]  AIMS Total Score 0      AUDIT    Flowsheet Row Ad[facility] ([facility]  Alcohol Use Disorder Identification Test Final Score (AUDIT) 0      PHQ2-9    Flowsheet Row ED from 02/21/2024 in [facility] ED from 02/19/2024 in [facility] Office Visit from 02/04/2022 in [facility] Of [place] Office Visit from 04/16/2021 in [facility] [place]  PHQ-2 Total Score 4 6 0 0  PHQ-9 Total Score 17 26 -- --      Flowsheet Row ED from 02/21/2024 in [facility] ED from  02/19/2024 in [facility] ED from 02/18/2024 in [facility]  C-SSRS RISK CATEGORY No Risk High Risk High Risk        Musculoskeletal  Strength & Muscle Tone: within normal limits Gait & Station: normal Patient leans: N/A  Psychiatric Specialty Exam  Presentation  General Appearance:  Appropriate for Environment  Eye Contact: Fair  Speech: Clear and Coherent; Normal Rate  Speech Volume: Decreased  Handedness: -- (not assessed)   Mood and Affect  Mood: Hopeless  Affect: Depressed; Constricted   Thought Process  Thought Processes: Linear  Descriptions of Associations:Intact  Orientation:None  Thought Content:Logical  Diagnosis of Schizophrenia or Schizoaffective disorder in past: No  Duration of Psychotic Symptoms: NA   Hallucinations:Hallucinations: None  Ideas of Reference:None  Suicidal Thoughts:Suicidal Thoughts: Yes, Passive SI Active Intent and/or Plan: Without Intent; Without Plan  Homicidal Thoughts:Homicidal Thoughts: No   Sensorium  Memory: Immediate Good;  Recent Good; Remote Good  Judgment: Fair  Insight: Poor; Fair   Art therapist  Concentration: Fair  Attention Span: Fair  Recall: [employer] of Knowledge: Fair  Language: Fair   Psychomotor Activity  Psychomotor Activity: Psychomotor Activity: Normal   Assets  Assets: Desire for Improvement; Resilience; Communication Skills   Sleep  Sleep: Sleep: Good    Physical Exam  Physical Exam Vitals and nursing note reviewed.  Constitutional:      General: He is not in acute distress.    Appearance: He is not ill-appearing.  HENT:     Head: Normocephalic and atraumatic.  Eyes:     Extraocular Movements: Extraocular movements intact.     Conjunctiva/sclera: Conjunctivae normal.  Pulmonary:     Effort: Pulmonary effort is normal. No respiratory distress.  Skin:    General: Skin is warm and  dry.     Comments: Multiple excoriations at the forearms bilaterally    Review of Systems  All other systems reviewed and are negative.  Blood pressure 118/77, pulse 89, temperature 97.9 F (36.6 C), temperature source Oral, resp. rate 16, SpO2 100%. There is no height or weight on file to calculate BMI.  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 Patient requested Lexapro be discontinued as he reported sedation as an intolerable side effect to this medication Continue Wellbutrin XL 150 mg daily Continue abilify 5 mg daily Clonidine discontinued due to orthostatic hypotension   Medical Issues Being Addressed:   HIV Continue home biktarvy   Other PRNs: acetaminophen, 650 mg, Q6H PRN alum & mag hydroxide-simeth, 30 mL, Q4H PRN diphenhydrAMINE, 25 mg, Q6H PRN haloperidol, 5 mg, TID PRN  And diphenhydrAMINE, 50 mg, TID PRN haloperidol lactate, 5 mg, TID PRN  And diphenhydrAMINE, 50 mg, TID PRN  And LORazepam, 2 mg, TID PRN haloperidol lactate, 10 mg, TID PRN  And diphenhydrAMINE, 50 mg, TID PRN  And LORazepam, 2 mg, TID PRN fluticasone, 2 spray, Daily PRN guaiFENesin, 600 mg, BID PRN hydrOXYzine, 25 mg, TID PRN loratadine, 10 mg, Daily PRN magnesium hydroxide, 30 mL, Daily PRN naphazoline-pheniramine, 2 drop, QID PRN traZODone, 50 mg, QHS PRN   Other Labs/Imaging Reviewed: CBC unremarkable, repeat CBC on 3/20 is unchanged CMP showing hyperglycemia 141, repeat on 3/20 is unremarkable A1C 5.6% Ethanol <10 Lipid panel is unremarkable TSH WNL  IVDU labs: RPR non-reactive Hepatitis panel non-reactive  EKG on 02/18/2024: QTc 399    Discharge Planning:   -- Social work and case management to assist with discharge planning and identification of hospital  follow-up needs prior to discharge  -- Disposition: Discharge to [facility] on Monday 3/24  -- Discharge Concerns: Need to establish a safety plan; Medication compliance and effectiveness  -- Discharge Goals: Transition to residential rehabilitation;outpatient referrals for mental health follow-up including medication management/psychotherapy     Signed: Lorri Frederick, MD 02/23/2024 3:55 PM

## 2024-02-23 NOTE — Group Note (Signed)
 Group Topic: Spirituality in Recovery  Group Date: 02/23/2024 Start Time: 0230 End Time: 0330 Facilitators: Loleta Dicker, LCSW  Department: [facility]  Number of Participants: 5  Group Focus: check in, clarity of thought, relapse prevention, and self-awareness Treatment Modality:  Behavior Modification Therapy, Cognitive Behavioral Therapy, Solution-Focused Therapy, and Spiritual Interventions utilized were clarification, exploration, group exercise, and problem solving Purpose: enhance coping skills, explore maladaptive thinking, and express feelings  Name: Kenneth Welch. Date of Birth: 11/29/1986  MR: [id number]     Level of Participation: Patient did not attend group on today. Patient was encouraged to participate in all programming on the milieu.   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

## 2024-02-23 NOTE — ED Notes (Signed)
 Pt sleeping at this time. Rise and fall of chest noted. Pt in NAD at this time. Will continue to monitor.

## 2024-02-23 NOTE — ED Notes (Signed)
 Pt A&Ox4, ambulatory w/ steady gait, calm and cooperative, VSS. Denies SI/HI/AVH.

## 2024-02-23 NOTE — ED Notes (Signed)
 Pt currently alert and talking w/ provider

## 2024-02-23 NOTE — ED Notes (Signed)
 Pt in the bedroom with eyes closed,composed and sleeping. NAD, respirations are even and unlabored.  Will continue to monitor for safety.

## 2024-02-23 NOTE — Group Note (Signed)
 Group Topic: Recovery Basics  Group Date: 02/23/2024 Start Time: 1645 End Time: 1715 Facilitators: Prentice Docker, RN  Department: [facility]  Number of Participants: 8  Group Focus: coping skills Treatment Modality:  Solution-Focused Therapy Interventions utilized were exploration Purpose: enhance coping skills  Name: Kenneth Welch. Date of Birth: [birth year]  MR: [id number]    Level of Participation: active Quality of Participation: attentive Interactions with others: gave feedback Mood/Affect: appropriate Triggers (if applicable): none identified Cognition: coherent/clear Progress: Gaining insight Response: "when I feel anxious, I think of other ways to deal with my situation" Plan: patient will be encouraged to utilize learned coping skills for increased anxiety and stress  Patients Problems:  Patient Active Problem List   Diagnosis Date Noted   Schizoaffective disorder, bipolar type (HCC) 02/19/2024   IVDU (intravenous drug user) 01/28/2024   Arm abscess 01/19/2024   Cellulitis 01/17/2024   PTSD (post-traumatic stress disorder) 11/29/2023   Methamphetamine use disorder, severe (HCC) 11/29/2023   Cannabis use disorder 11/29/2023   Long term current use of antipsychotic medication 11/29/2023   Schizoaffective disorder, depressive type (HCC) 11/27/2023   Homelessness 06/13/2023   Suicidal ideation 06/13/2023   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

## 2024-02-23 NOTE — ED Notes (Signed)
 Pt sleeping; rise and fall of chest noted, pt in NAD.

## 2024-02-23 NOTE — Group Note (Signed)
 Group Topic: Identity and Relationships  Group Date: 02/23/2024 Start Time: 2015 End Time: 2045 Facilitators: Darin Engels  Department: [facility]  Number of Participants: 4  Group Focus: acceptance, communication, coping skills, feeling awareness/expression, forgiveness, reminiscence, and self-awareness Treatment Modality:  Leisure Development and Psychoeducation Interventions utilized were group exercise, problem solving, reminiscence, story telling, and support Purpose: enhance coping skills, express feelings, improve communication skills, increase insight, and regain self-worth  Name: Kenneth Welch. Date of Birth: [birth year]  MR: [id number]    Level of Participation: pt did not attend group  Quality of Participation: pt did not attend group  Interactions with others: minimal  Mood/Affect: UTA  Triggers (if applicable): n/a Cognition: coherent/clear Progress: Moderate Response: pt remained in bed and declined invitation to group  Plan: patient will be encouraged to attend group   Patients Problems:  Patient Active Problem List   Diagnosis Date Noted   Schizoaffective disorder, bipolar type (HCC) 02/19/2024   IVDU (intravenous drug user) 01/28/2024   Arm abscess 01/19/2024   Cellulitis 01/17/2024   PTSD (post-traumatic stress disorder) 11/29/2023   Methamphetamine use disorder, severe (HCC) 11/29/2023   Cannabis use disorder 11/29/2023   Long term current use of antipsychotic medication 11/29/2023   Schizoaffective disorder, depressive type (HCC) 11/27/2023   Homelessness 06/13/2023   Suicidal ideation 06/13/2023   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

## 2024-02-23 NOTE — ED Notes (Signed)
 Pt is in his room resting in bed. Pt denies SI/HI/AVH. No acute distress noted. Will continue to monitor for safety.

## 2024-02-23 NOTE — ED Notes (Signed)
 Pt sleeping in no acute distress. RR even and unlabored. Environment secured. Will continue to monitor for safety.

## 2024-02-23 NOTE — ED Notes (Signed)
 Patient is in the bed comfortably sleeping. NAD. Will monitor for safety.

## 2024-02-24 MED ORDER — HYDROXYZINE HCL 25 MG PO TABS: 25.0000 mg | ORAL_TABLET | Freq: Three times a day (TID) | ORAL | 0 refills | Status: DC | PRN

## 2024-02-24 MED ORDER — LORATADINE 10 MG PO TABS: 10.0000 mg | ORAL_TABLET | Freq: Every day | ORAL | 0 refills | Status: AC | PRN

## 2024-02-24 MED ORDER — ARIPIPRAZOLE 10 MG PO TABS: 10.0000 mg | ORAL_TABLET | Freq: Every day | ORAL | 0 refills | Status: DC

## 2024-02-24 MED ORDER — FLUTICASONE PROPIONATE 50 MCG/ACT NA SUSP: 2.0000 | Freq: Every day | NASAL | 0 refills | Status: DC | PRN

## 2024-02-24 MED ORDER — TRAZODONE HCL 50 MG PO TABS: 50.0000 mg | ORAL_TABLET | Freq: Every evening | ORAL | 0 refills | Status: DC | PRN

## 2024-02-24 MED ORDER — BIKTARVY 50-200-25 MG PO TABS: 1.0000 | ORAL_TABLET | Freq: Every day | ORAL | 0 refills | Status: DC

## 2024-02-24 MED ORDER — BUPROPION HCL ER (XL) 150 MG PO TB24: 150.0000 mg | ORAL_TABLET | Freq: Every day | ORAL | 0 refills | Status: DC

## 2024-02-24 MED ORDER — NAPHAZOLINE-PHENIRAMINE 0.025-0.3 % OP SOLN: 2.0000 [drp] | Freq: Four times a day (QID) | OPHTHALMIC | 0 refills | Status: DC | PRN

## 2024-02-24 MED ORDER — GUAIFENESIN ER 600 MG PO TB12: 600.0000 mg | ORAL_TABLET | Freq: Two times a day (BID) | ORAL | 0 refills | Status: DC | PRN

## 2024-02-24 NOTE — ED Notes (Signed)
 Patient sitting in dayroom calm and composed. No acute distress noted. No concerns voiced. Informed patient to notify staff with any needs or assistance. Patient verbalized understanding or agreement. Safety checks in place per facility policy.

## 2024-02-24 NOTE — Group Note (Signed)
 Group Topic: Change and Accountability  Group Date: 02/24/2024 Start Time: 1145 End Time: 1215 Facilitators: Elenor Quinones, NT  Department: [facility]  Number of Participants: 7  Group Focus: affirmation Treatment Modality:  Psychoeducation Interventions utilized were small goals for today Purpose: increase insight  Name: Kenneth Welch. Date of Birth: 03/30/1986  MR: [id number]    Level of Participation: moderate Quality of Participation: attentive Interactions with others: gave feedback Mood/Affect: appropriate Triggers (if applicable): n/a Cognition: goal directed Progress: Moderate Response: Pt's goal for today is to read more books and try not to stay in bed all day. 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

## 2024-02-24 NOTE — ED Notes (Signed)
 Patient sitting in dayroom interacting with peers. Patient provided with lunch. No acute distress noted. No concerns voiced. Informed patient to notify staff with any needs or assistance. Patient verbalized understanding or agreement. Safety checks in place per facility policy.

## 2024-02-24 NOTE — ED Notes (Signed)
 Patient alert & oriented x4. Denies intent to harm self or others when asked. Denies A/VH. Patient denies any physical complaints when asked. No acute distress noted. Scheduled medications administered with no complications. Support and encouragement provided. Routine safety checks conducted per facility protocol. Encouraged patient to notify staff if any thoughts of harm towards self or others arise. Patient verbalizes understanding and agreement.

## 2024-02-24 NOTE — ED Provider Notes (Signed)
 Behavioral Health Progress Note  Date and Time: 02/24/2024 1:21 PM Name: Kenneth Welch. MRN:  [id number]  [name] Is a 38 y/o male with a history of schizophrenia/schizoaffective disorder, post-traumatic stress disorder (PTSD), and methamphetamine abuse. Patient presented voluntarily to the [facility] on 02/19/2024 with complaint of suicidal ideations and was transferred to [facility] on the same day for crisis stabilization and detox. PMHx is significant for HIV, compliant on biktarvy. UDS on admission is positive for methamphetamine.   Subjective:   The patient denies auditory/visual hallucinations.  The patient reports good mood, appetite, and sleep. They deny suicidal and homicidal thoughts. The patient denies side effects from their medications.  Review of systems as below. The patient denies experiencing any withdrawal symptoms.   Discussed the plan for the patient to discharge to [facility] on 3/24.  The patient is agreeable to this plan.  Pharmacy to provide samples, and paper prescription to be supplied.  Diagnosis:  Final diagnoses:  Methamphetamine abuse (HCC)  Stimulant use disorder  IVDU (intravenous drug user)  Substance induced mood disorder (HCC)  Moderate episode of recurrent major depressive disorder (HCC)  Suicidal ideations    Total Time spent with patient: 30 minutes  Substance Use Hx: Alcohol: Denies Tobacco: Denies Cannabis: denies current use,reports he has not smoked in weeks Cocaine: denies Methamphetamines: reports he has been using since 89 initially smoking and later injecting it. He believes  he injects about 2 ccs. He uses twice a week. Route: IV Psilocybin (mushrooms): denies Ecstasy (MDMA / molly): denies LSD (acid): never tried: denies Opiates (fentanyl / heroin): denies Benzos (Xanax, Klonopin): denies IVDU: Yes, see above Rehab hx: Denies   Past Psychiatric Hx: Current Psychiatrist: No Current Therapist: No Previous Psychiatric  Diagnoses: Per pt --PTSD, schizoaffective d/o depressive type Current psychiatric medications: Abilify 10 mg (non-compliant, has not had access to these medications) Previous medication trials: prozac (stopped due to excess sedation), clonidine, benztropine (takes it in with abilify), zyprexa (pt describes dystonic reaction) Psychiatric Hospitalization hx: No Psychotherapy hx: reports he had CBT with a therapist when he lived in [place] Neuromodulation history: Denies History of suicide (obtained from HPI):reports 1 prior attempt in July 2024 via OD on combination of prescribed medications History of homicide or aggression (obtained in HPI): reports he was a aggressive as a child, denies any issues in adulthood   Past Medical History: PCP: sees PA at [facility] Dx:HIV Medications:biktarvy Allergies: denies Hospitalizations: Surgeries: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 Mother - liver cancer Father - liver cancer   Family Psychiatric History: Psychiatric WU:[id number] Suicide YN:WGNFAO Substance use:   Social History: Lives in [place] alone, lost his home prior to arriving here.  Reports his parents are both deceased. Patient has 2 sisters, but he is not in contact with them. Social Support: Denies Education: some college Occupational hx: unemployed, on SSI for schizphrenia Marital Status:Single Children:denies Legal:denies any upcoming court dates, has prior Nature conservation officer: No Access to firearms: No     Current Medications:  Current Facility-Administered Medications  Medication Dose Route Frequency Provider Last Rate Last Admin   acetaminophen (TYLENOL) tablet 650 mg  650 mg Oral Q6H PRN Carrion-Carrero, Margely, MD       alum & mag hydroxide-simeth (MAALOX/MYLANTA) 200-200-20 MG/5ML suspension 30 mL  30 mL Oral Q4H PRN Carrion-Carrero, Margely, MD        ARIPiprazole (ABILIFY)  tablet 10 mg  10 mg Oral Daily Carrion-Carrero, Karle Starch, MD   10 mg at 02/24/24 0948   bictegravir-emtricitabine-tenofovir AF (BIKTARVY) 50-200-25 MG per tablet 1 tablet  1 tablet Oral Daily Carrion-Carrero, Karle Starch, MD   1 tablet at 02/24/24 0948   buPROPion (WELLBUTRIN XL) 24 hr tablet 150 mg  150 mg Oral Daily Carrion-Carrero, Karle Starch, MD   150 mg at 02/24/24 1478   diphenhydrAMINE (BENADRYL) capsule 25 mg  25 mg Oral Q6H PRN Carrion-Carrero, Margely, MD       haloperidol (HALDOL) tablet 5 mg  5 mg Oral TID PRN Carrion-Carrero, Margely, MD       And   diphenhydrAMINE (BENADRYL) capsule 50 mg  50 mg Oral TID PRN Carrion-Carrero, Margely, MD       haloperidol lactate (HALDOL) injection 5 mg  5 mg Intramuscular TID PRN Carrion-Carrero, Margely, MD       And   diphenhydrAMINE (BENADRYL) injection 50 mg  50 mg Intramuscular TID PRN Carrion-Carrero, Margely, MD       And   LORazepam (ATIVAN) injection 2 mg  2 mg Intramuscular TID PRN Carrion-Carrero, Margely, MD       haloperidol lactate (HALDOL) injection 10 mg  10 mg Intramuscular TID PRN Carrion-Carrero, Margely, MD       And   diphenhydrAMINE (BENADRYL) injection 50 mg  50 mg Intramuscular TID PRN Carrion-Carrero, Margely, MD       And   LORazepam (ATIVAN) injection 2 mg  2 mg Intramuscular TID PRN Carrion-Carrero, Margely, MD       fluticasone (FLONASE) 50 MCG/ACT nasal spray 2 spray  2 spray Each Nare Daily PRN Carrion-Carrero, Margely, MD       guaiFENesin (MUCINEX) 12 hr tablet 600 mg  600 mg Oral BID PRN Carrion-Carrero, Margely, MD       hydrOXYzine (ATARAX) tablet 25 mg  25 mg Oral TID PRN Carrion-Carrero, Margely, MD       loratadine (CLARITIN) tablet 10 mg  10 mg Oral Daily PRN Carrion-Carrero, Margely, MD       magnesium hydroxide (MILK OF MAGNESIA) suspension 30 mL  30 mL Oral Daily PRN Carrion-Carrero, Margely, MD       naphazoline-pheniramine (NAPHCON-A) 0.025-0.3 % ophthalmic solution 2 drop  2 drop Both  Eyes QID PRN Carrion-Carrero, Margely, MD       traZODone (DESYREL) tablet 50 mg  50 mg Oral QHS PRN Carrion-Carrero, Margely, MD       Current Outpatient Medications  Medication Sig Dispense Refill   ARIPiprazole (ABILIFY) 10 MG tablet Take 1 tablet (10 mg total) by mouth daily. 30 tablet 0   [START ON 02/25/2024] bictegravir-emtricitabine-tenofovir AF (BIKTARVY) 50-200-25 MG TABS tablet Take 1 tablet by mouth daily. 30 tablet 0   [START ON 02/25/2024] buPROPion (WELLBUTRIN XL) 150 MG 24 hr tablet Take 1 tablet (150 mg total) by mouth daily. 30 tablet 0   fluticasone (FLONASE) 50 MCG/ACT nasal spray Place 2 sprays into both nostrils daily as needed for allergies or rhinitis. 11.1 mL 0   guaiFENesin (MUCINEX) 600 MG 12 hr tablet Take 1 tablet (600 mg total) by mouth 2 (two) times daily as needed for cough or to loosen phlegm. 30 tablet 0   hydrOXYzine (ATARAX) 25 MG tablet Take 1 tablet (25 mg total) by mouth 3 (three) times daily as needed for anxiety. 30 tablet 0   loratadine (CLARITIN) 10 MG tablet Take 1 tablet (10 mg total) by mouth daily as needed for allergies or rhinitis. 30  tablet 0   naphazoline-pheniramine (NAPHCON-A) 0.025-0.3 % ophthalmic solution Place 2 drops into both eyes 4 (four) times daily as needed for eye irritation. 15 mL 0   traZODone (DESYREL) 50 MG tablet Take 1 tablet (50 mg total) by mouth at bedtime as needed for sleep. 30 tablet 0    Labs  Lab Results:  Admission on 02/21/2024  Component Date Value Ref Range Status   WBC 02/22/2024 5.7  4.0 - 10.5 K/uL Final   RBC 02/22/2024 4.30  4.22 - 5.81 MIL/uL Final   Hemoglobin 02/22/2024 13.4  13.0 - 17.0 g/dL Final   HCT 60/45/4098 40.3  39.0 - 52.0 % Final   MCV 02/22/2024 93.7  80.0 - 100.0 fL Final   MCH 02/22/2024 31.2  26.0 - 34.0 pg Final   MCHC 02/22/2024 33.3  30.0 - 36.0 g/dL Final   RDW 11/91/4782 14.3  11.5 - 15.5 % Final   Platelets 02/22/2024 214  150 - 400 K/uL Final   nRBC 02/22/2024 0.0  0.0 - 0.2 %  Final   Neutrophils Relative % 02/22/2024 42  % Final   Neutro Abs 02/22/2024 2.4  1.7 - 7.7 K/uL Final   Lymphocytes Relative 02/22/2024 50  % Final   Lymphs Abs 02/22/2024 2.8  0.7 - 4.0 K/uL Final   Monocytes Relative 02/22/2024 5  % Final   Monocytes Absolute 02/22/2024 0.3  0.1 - 1.0 K/uL Final   Eosinophils Relative 02/22/2024 3  % Final   Eosinophils Absolute 02/22/2024 0.2  0.0 - 0.5 K/uL Final   Basophils Relative 02/22/2024 0  % Final   Basophils Absolute 02/22/2024 0.0  0.0 - 0.1 K/uL Final   Immature Granulocytes 02/22/2024 0  % Final   Abs Immature Granulocytes 02/22/2024 0.01  0.00 - 0.07 K/uL Final   Performed at [facility], [address] [place], [place] [address]   Sodium 02/22/2024 135  135 - 145 mmol/L Final   Potassium 02/22/2024 3.8  3.5 - 5.1 mmol/L Final   Chloride 02/22/2024 105  98 - 111 mmol/L Final   CO2 02/22/2024 26  22 - 32 mmol/L Final   Glucose, Bld 02/22/2024 89  70 - 99 mg/dL Final   Glucose reference range applies only to samples taken after fasting for at least 8 hours.   BUN 02/22/2024 22 (H)  6 - 20 mg/dL Final   Creatinine, Ser 02/22/2024 1.13  0.61 - 1.24 mg/dL Final   Calcium 30/86/5784 8.5 (L)  8.9 - 10.3 mg/dL Final   Total Protein 69/62/9528 6.6  6.5 - 8.1 g/dL Final   Albumin 41/32/4401 3.2 (L)  3.5 - 5.0 g/dL Final   AST 02/72/5366 14 (L)  15 - 41 U/L Final   ALT 02/22/2024 15  0 - 44 U/L Final   Alkaline Phosphatase 02/22/2024 42  38 - 126 U/L Final   Total Bilirubin 02/22/2024 0.4  0.0 - 1.2 mg/dL Final   GFR, Estimated 02/22/2024 >60  >60 mL/min Final   Comment: (NOTE) Calculated using the CKD-EPI Creatinine Equation (2021)    Anion gap 02/22/2024 4 (L)  5 - 15 Final   Performed at [facility], [address] [place], [place] [address]   TSH 02/22/2024 2.786  0.350 - 4.500 uIU/mL Final   Comment: Performed by a 3rd Generation assay with a functional sensitivity of <=0.01 uIU/mL. Performed at [facility], [address], [place], [place] [address]    RPR Ser Ql  02/22/2024 NON REACTIVE  NON REACTIVE Final   Performed at [facility], [address], [place], [place] [address]   Hepatitis B Surface Ag 02/22/2024 NON REACTIVE  NON REACTIVE Final   HCV Ab 02/22/2024 NON REACTIVE  NON REACTIVE Final   Comment: (NOTE) Nonreactive HCV antibody screen is consistent with no HCV infections,  unless recent infection is suspected or other evidence exists to indicate HCV infection.     Hep A IgM 02/22/2024 NON REACTIVE  NON REACTIVE Final   Hep B C IgM 02/22/2024 NON REACTIVE  NON REACTIVE Final   Performed at [facility], [address] [place], [place] [address]  Admission on 02/19/2024, Discharged on 02/21/2024  Component Date Value Ref Range Status   Glucose-Capillary 02/21/2024 90  70 - 99 mg/dL Final   Glucose reference range applies only to samples taken after fasting for at least 8 hours.  Admission on 02/18/2024, Discharged on 02/19/2024  Component Date Value Ref Range Status   WBC 02/18/2024 6.6  4.0 - 10.5 K/uL Final   RBC 02/18/2024 4.78  4.22 - 5.81 MIL/uL Final   Hemoglobin 02/18/2024 15.0  13.0 - 17.0 g/dL Final   HCT 09/81/1914 45.0  39.0 - 52.0 % Final   MCV 02/18/2024 94.1  80.0 - 100.0 fL Final   MCH 02/18/2024 31.4  26.0 - 34.0 pg Final   MCHC 02/18/2024 33.3  30.0 - 36.0 g/dL Final   RDW 78/29/5621 14.5  11.5 - 15.5 % Final   Platelets 02/18/2024 238  150 - 400 K/uL Final   nRBC 02/18/2024 0.0  0.0 - 0.2 % Final   Neutrophils Relative % 02/18/2024 57  % Final   Neutro Abs 02/18/2024 3.8  1.7 - 7.7 K/uL Final   Lymphocytes Relative 02/18/2024 31  % Final   Lymphs Abs 02/18/2024 2.0  0.7 - 4.0 K/uL Final   Monocytes Relative 02/18/2024 9  % Final   Monocytes Absolute 02/18/2024 0.6  0.1 - 1.0 K/uL Final   Eosinophils Relative 02/18/2024 2  % Final   Eosinophils Absolute 02/18/2024 0.1  0.0 - 0.5 K/uL Final   Basophils Relative 02/18/2024 1  % Final    Basophils Absolute 02/18/2024 0.0  0.0 - 0.1 K/uL Final   Immature Granulocytes 02/18/2024 0  % Final   Abs Immature Granulocytes 02/18/2024 0.02  0.00 - 0.07 K/uL Final   Performed at [facility], [address], [place], [place] [address]   Sodium 02/18/2024 136  135 - 145 mmol/L Final   Potassium 02/18/2024 4.2  3.5 - 5.1 mmol/L Final   Chloride 02/18/2024 100  98 - 111 mmol/L Final   CO2 02/18/2024 28  22 - 32 mmol/L Final   Glucose, Bld 02/18/2024 141 (H)  70 - 99 mg/dL Final   Glucose reference range applies only to samples taken after fasting for at least 8 hours.   BUN 02/18/2024 15  6 - 20 mg/dL Final   Creatinine, Ser 02/18/2024 1.09  0.61 - 1.24 mg/dL Final   Calcium 78/46/9629 9.6  8.9 - 10.3 mg/dL Final   Total Protein 52/84/1324 7.9  6.5 - 8.1 g/dL Final   Albumin 40/09/2724 4.0  3.5 - 5.0 g/dL Final   AST 36/64/4034 19  15 - 41 U/L Final   ALT 02/18/2024 21  0 - 44 U/L Final   Alkaline Phosphatase 02/18/2024 54  38 - 126 U/L Final   Total Bilirubin 02/18/2024 0.6  0.0 -  1.2 mg/dL Final   GFR, Estimated 02/18/2024 >60  >60 mL/min Final   Comment: (NOTE) Calculated using the CKD-EPI Creatinine Equation (2021)    Anion gap 02/18/2024 8  5 - 15 Final   Performed at [facility], [address], [place], [place] [address]   Hgb A1c MFr Bld 02/18/2024 5.6  4.8 - 5.6 % Final   Comment: (NOTE) Pre diabetes:          5.7%-6.4%  Diabetes:              >6.4%  Glycemic control for   <7.0% adults with diabetes    Mean Plasma Glucose 02/18/2024 114.02  mg/dL Final   Performed at [facility], [address], [place], [place] [address]   Alcohol, Ethyl (B) 02/18/2024 <10  <10 mg/dL Final   Comment: (NOTE) Lowest detectable limit for serum alcohol is 10 mg/dL.  For medical purposes only. Performed at [facility], [address] [place], [place] [address]    Cholesterol 02/18/2024 154  0 - 200 mg/dL Final   Triglycerides 21/30/8657 63  <150 mg/dL  Final   HDL 84/69/6295 47  >40 mg/dL Final   Total CHOL/HDL Ratio 02/18/2024 3.3  RATIO Final   VLDL 02/18/2024 13  0 - 40 mg/dL Final   LDL Cholesterol 02/18/2024 94  0 - 99 mg/dL Final   Comment:        Total Cholesterol/HDL:CHD Risk Coronary Heart Disease Risk Table                     Men   Women  1/2 Average Risk   3.4   3.3  Average Risk       5.0   4.4  2 X Average Risk   9.6   7.1  3 X Average Risk  23.4   11.0        Use the calculated Patient Ratio above and the CHD Risk Table to determine the patient's CHD Risk.        ATP III CLASSIFICATION (LDL):  <100     mg/dL   Optimal  284-132  mg/dL   Near or Above                    Optimal  130-159  mg/dL   Borderline  440-102  mg/dL   High  >725     mg/dL   Very High Performed at [facility], [address] [place], [place] [address]    TSH 02/18/2024 3.451  0.350 - 4.500 uIU/mL Final   Comment: Performed by a 3rd Generation assay with a functional sensitivity of <=0.01 uIU/mL. Performed at [facility], [address] [place], [place] [address]    POC Amphetamine UR 02/18/2024 None Detected  NONE DETECTED (Cut Off Level 1000 ng/mL) Final   POC Secobarbital (BAR) 02/18/2024 None Detected  NONE DETECTED (Cut Off Level 300 ng/mL) Final   POC Buprenorphine (BUP) 02/18/2024 None Detected  NONE DETECTED (Cut Off Level 10 ng/mL) Final   POC Oxazepam (BZO) 02/18/2024 None Detected  NONE DETECTED (Cut Off Level 300 ng/mL) Final   POC Cocaine UR 02/18/2024 None Detected  NONE DETECTED (Cut Off Level 300 ng/mL) Final   POC Methamphetamine UR 02/18/2024 Positive (A)  NONE DETECTED (Cut Off Level 1000 ng/mL) Final   POC Morphine 02/18/2024 None Detected  NONE DETECTED (Cut Off Level 300 ng/mL) Final   POC  Methadone UR 02/18/2024 None Detected  NONE DETECTED (Cut Off Level 300 ng/mL) Final   POC Oxycodone UR 02/18/2024 None Detected  NONE DETECTED (Cut Off Level 100 ng/mL) Final   POC Marijuana UR 02/18/2024 None  Detected  NONE DETECTED (Cut Off Level 50 ng/mL) Final  Admission on 01/16/2024, Discharged on 01/19/2024  Component Date Value Ref Range Status   Sodium 01/16/2024 136  135 - 145 mmol/L Final   Potassium 01/16/2024 4.2  3.5 - 5.1 mmol/L Final   Chloride 01/16/2024 101  98 - 111 mmol/L Final   CO2 01/16/2024 25  22 - 32 mmol/L Final   Glucose, Bld 01/16/2024 86  70 - 99 mg/dL Final   Glucose reference range applies only to samples taken after fasting for at least 8 hours.   BUN 01/16/2024 11  6 - 20 mg/dL Final   Creatinine, Ser 01/16/2024 1.07  0.61 - 1.24 mg/dL Final   Calcium 16/09/9603 9.0  8.9 - 10.3 mg/dL Final   Total Protein 54/08/8118 7.6  6.5 - 8.1 g/dL Final   Albumin 14/78/2956 3.7  3.5 - 5.0 g/dL Final   AST 21/30/8657 20  15 - 41 U/L Final   ALT 01/16/2024 18  0 - 44 U/L Final   Alkaline Phosphatase 01/16/2024 51  38 - 126 U/L Final   Total Bilirubin 01/16/2024 0.4  0.0 - 1.2 mg/dL Final   GFR, Estimated 01/16/2024 >60  >60 mL/min Final   Comment: (NOTE) Calculated using the CKD-EPI Creatinine Equation (2021)    Anion gap 01/16/2024 10  5 - 15 Final   Performed at [facility], [address], [place], [place] [address]   Lactic Acid, Venous 01/16/2024 1.2  0.5 - 1.9 mmol/L Final   WBC 01/16/2024 9.0  4.0 - 10.5 K/uL Final   RBC 01/16/2024 4.42  4.22 - 5.81 MIL/uL Final   Hemoglobin 01/16/2024 13.7  13.0 - 17.0 g/dL Final   HCT 29/52/8413 41.4  39.0 - 52.0 % Final   MCV 01/16/2024 93.7  80.0 - 100.0 fL Final   MCH 01/16/2024 31.0  26.0 - 34.0 pg Final   MCHC 01/16/2024 33.1  30.0 - 36.0 g/dL Final   RDW 24/40/1027 14.0  11.5 - 15.5 % Final   Platelets 01/16/2024 302  150 - 400 K/uL Final   nRBC 01/16/2024 0.0  0.0 - 0.2 % Final   Neutrophils Relative % 01/16/2024 59  % Final   Neutro Abs 01/16/2024 5.2  1.7 - 7.7 K/uL Final   Lymphocytes Relative 01/16/2024 31  % Final   Lymphs Abs 01/16/2024 2.8  0.7 - 4.0 K/uL Final   Monocytes Relative 01/16/2024 6  %  Final   Monocytes Absolute 01/16/2024 0.5  0.1 - 1.0 K/uL Final   Eosinophils Relative 01/16/2024 4  % Final   Eosinophils Absolute 01/16/2024 0.3  0.0 - 0.5 K/uL Final   Basophils Relative 01/16/2024 0  % Final   Basophils Absolute 01/16/2024 0.0  0.0 - 0.1 K/uL Final   Immature Granulocytes 01/16/2024 0  % Final   Abs Immature Granulocytes 01/16/2024 0.04  0.00 - 0.07 K/uL Final   Performed at [facility], [address], [place], [place] [address]   Prothrombin Time 01/16/2024 13.4  11.4 - 15.2 seconds Final   INR 01/16/2024 1.0  0.8 - 1.2 Final   Comment: (NOTE) INR goal varies based on device and disease states. Performed at [facility], [address], [place],  [place] [address]    Specimen Description 01/16/2024 BLOOD RIGHT ARM   Final   Special Requests 01/16/2024 BOTTLES DRAWN AEROBIC AND ANAEROBIC Blood Culture adequate volume   Final   Culture 01/16/2024    Final                   Value:NO GROWTH 5 DAYS Performed at [facility], [address] [place], [place] [address]    Report Status 01/16/2024 01/22/2024 FINAL   Final   Specimen Description 01/16/2024 BLOOD RIGHT HAND   Final   Special Requests 01/16/2024 BOTTLES DRAWN AEROBIC AND ANAEROBIC Blood Culture adequate volume   Final   Culture  Setup Time 01/16/2024    Final                   Value:GRAM POSITIVE COCCI IN CLUSTERS ANAEROBIC BOTTLE ONLY CRITICAL RESULT CALLED TO, READ BACK BY AND VERIFIED WITH: PHARMD K. HURTH [id number] @ 2121 FH    Culture 01/16/2024  (A)   Final                   Value:STAPHYLOCOCCUS EPIDERMIDIS THE SIGNIFICANCE OF ISOLATING THIS ORGANISM FROM A SINGLE SET OF BLOOD CULTURES WHEN MULTIPLE SETS ARE DRAWN IS UNCERTAIN. PLEASE NOTIFY THE MICROBIOLOGY DEPARTMENT WITHIN ONE WEEK IF SPECIATION AND SENSITIVITIES ARE REQUIRED. Performed at [facility], [address], [place], [place] [address]    Report Status 01/16/2024 01/19/2024 FINAL   Final   CD4 T Cell Abs  01/17/2024 919  400 - 1,790 /uL Final   CD4 % Helper T Cell 01/17/2024 36  33 - 65 % Final   Performed at [facility], [address], [place], [place] [address]   WBC 01/17/2024 7.9  4.0 - 10.5 K/uL Final   RBC 01/17/2024 3.98 (L)  4.22 - 5.81 MIL/uL Final   Hemoglobin 01/17/2024 12.4 (L)  13.0 - 17.0 g/dL Final   HCT 03/47/4259 37.7 (L)  39.0 - 52.0 % Final   MCV 01/17/2024 94.7  80.0 - 100.0 fL Final   MCH 01/17/2024 31.2  26.0 - 34.0 pg Final   MCHC 01/17/2024 32.9  30.0 - 36.0 g/dL Final   RDW 56/38/7564 13.8  11.5 - 15.5 % Final   Platelets 01/17/2024 264  150 - 400 K/uL Final   nRBC 01/17/2024 0.0  0.0 - 0.2 % Final   Performed at [facility], [address], [place], [place] [address]   Creatinine, Ser 01/17/2024 1.22  0.61 - 1.24 mg/dL Final   GFR, Estimated 01/17/2024 >60  >60 mL/min Final   Comment: (NOTE) Calculated using the CKD-EPI Creatinine Equation (2021) Performed at [facility], [address], [place], [place] [address]    WBC 01/18/2024 4.7  4.0 - 10.5 K/uL Final   RBC 01/18/2024 4.45  4.22 - 5.81 MIL/uL Final   Hemoglobin 01/18/2024 13.7  13.0 - 17.0 g/dL Final   HCT 66/05/3015 41.0  39.0 - 52.0 % Final   MCV 01/18/2024 92.1  80.0 - 100.0 fL Final   MCH 01/18/2024 30.8  26.0 - 34.0 pg Final   MCHC 01/18/2024 33.4  30.0 - 36.0 g/dL Final   RDW 12/13/3233 13.6  11.5 - 15.5 % Final   Platelets 01/18/2024 272  150 - 400 K/uL Final   nRBC 01/18/2024 0.0  0.0 - 0.2 % Final   Neutrophils Relative % 01/18/2024 43  % Final   Neutro Abs 01/18/2024  2.0  1.7 - 7.7 K/uL Final   Lymphocytes Relative 01/18/2024 43  % Final   Lymphs Abs 01/18/2024 2.0  0.7 - 4.0 K/uL Final   Monocytes Relative 01/18/2024 6  % Final   Monocytes Absolute 01/18/2024 0.3  0.1 - 1.0 K/uL Final   Eosinophils Relative 01/18/2024 7  % Final   Eosinophils Absolute 01/18/2024 0.3  0.0 - 0.5 K/uL Final   Basophils Relative 01/18/2024 1  % Final   Basophils Absolute  01/18/2024 0.0  0.0 - 0.1 K/uL Final   Immature Granulocytes 01/18/2024 0  % Final   Abs Immature Granulocytes 01/18/2024 0.00  0.00 - 0.07 K/uL Final   Performed at [facility], [address] [place], [place] [address]   Sodium 01/18/2024 138  135 - 145 mmol/L Final   Potassium 01/18/2024 4.1  3.5 - 5.1 mmol/L Final   Chloride 01/18/2024 101  98 - 111 mmol/L Final   CO2 01/18/2024 25  22 - 32 mmol/L Final   Glucose, Bld 01/18/2024 89  70 - 99 mg/dL Final   Glucose reference range applies only to samples taken after fasting for at least 8 hours.   BUN 01/18/2024 13  6 - 20 mg/dL Final   Creatinine, Ser 01/18/2024 1.16  0.61 - 1.24 mg/dL Final   Calcium 60/45/4098 9.3  8.9 - 10.3 mg/dL Final   GFR, Estimated 01/18/2024 >60  >60 mL/min Final   Comment: (NOTE) Calculated using the CKD-EPI Creatinine Equation (2021)    Anion gap 01/18/2024 12  5 - 15 Final   Performed at [facility], [address] [place], [place] [address]   Enterococcus faecalis 01/16/2024 NOT DETECTED  NOT DETECTED Final   Enterococcus Faecium 01/16/2024 NOT DETECTED  NOT DETECTED Final   Listeria monocytogenes 01/16/2024 NOT DETECTED  NOT DETECTED Final   Staphylococcus species 01/16/2024 DETECTED (A)  NOT DETECTED Final   Comment: CRITICAL RESULT CALLED TO, READ BACK BY AND VERIFIED WITH: PHARMD K. HURTH [id number] @2121  FH    Staphylococcus aureus (BCID) 01/16/2024 NOT DETECTED  NOT DETECTED Final   Staphylococcus epidermidis 01/16/2024 DETECTED (A)  NOT DETECTED Final   Comment: CRITICAL RESULT CALLED TO, READ BACK BY AND VERIFIED WITH: PHARMD K. HURTH [id number] @2121  FH    Staphylococcus lugdunensis 01/16/2024 NOT DETECTED  NOT DETECTED Final   Streptococcus species 01/16/2024 NOT DETECTED  NOT DETECTED Final   Streptococcus agalactiae 01/16/2024 NOT DETECTED  NOT DETECTED Final   Streptococcus pneumoniae 01/16/2024 NOT DETECTED  NOT DETECTED Final   Streptococcus pyogenes 01/16/2024 NOT DETECTED   NOT DETECTED Final   A.calcoaceticus-baumannii 01/16/2024 NOT DETECTED  NOT DETECTED Final   Bacteroides fragilis 01/16/2024 NOT DETECTED  NOT DETECTED Final   Enterobacterales 01/16/2024 NOT DETECTED  NOT DETECTED Final   Enterobacter cloacae complex 01/16/2024 NOT DETECTED  NOT DETECTED Final   Escherichia coli 01/16/2024 NOT DETECTED  NOT DETECTED Final   Klebsiella aerogenes 01/16/2024 NOT DETECTED  NOT DETECTED Final   Klebsiella oxytoca 01/16/2024 NOT DETECTED  NOT DETECTED Final   Klebsiella pneumoniae 01/16/2024 NOT DETECTED  NOT DETECTED Final   Proteus species 01/16/2024 NOT DETECTED  NOT DETECTED Final   Salmonella species 01/16/2024 NOT DETECTED  NOT DETECTED Final   Serratia marcescens 01/16/2024 NOT DETECTED  NOT DETECTED Final   Haemophilus influenzae 01/16/2024 NOT DETECTED  NOT DETECTED Final   Neisseria meningitidis 01/16/2024 NOT DETECTED  NOT DETECTED Final   Pseudomonas aeruginosa 01/16/2024 NOT DETECTED  NOT DETECTED Final  Stenotrophomonas maltophilia 01/16/2024 NOT DETECTED  NOT DETECTED Final   Candida albicans 01/16/2024 NOT DETECTED  NOT DETECTED Final   Candida auris 01/16/2024 NOT DETECTED  NOT DETECTED Final   Candida glabrata 01/16/2024 NOT DETECTED  NOT DETECTED Final   Candida krusei 01/16/2024 NOT DETECTED  NOT DETECTED Final   Candida parapsilosis 01/16/2024 NOT DETECTED  NOT DETECTED Final   Candida tropicalis 01/16/2024 NOT DETECTED  NOT DETECTED Final   Cryptococcus neoformans/gattii 01/16/2024 NOT DETECTED  NOT DETECTED Final   Methicillin resistance mecA/C 01/16/2024 NOT DETECTED  NOT DETECTED Final   Performed at [facility], [address], [place], [place] [address]   Total lymphocyte count 01/18/2024 2,126  1,000 - 4,000 /uL Final   CD4% 01/18/2024 39.16  33 - 65 % Final   CD4 absolute 01/18/2024 833  400 - 1,790 /uL Final   CD8tox 01/18/2024 34.88  12 - 40 % Final   CD8 T Cell Abs 01/18/2024 742  190 - 1,000 /uL Final   Ratio  01/18/2024 1.12  1.0 - 3.0 Final   Performed at [facility], [address] [place], [place] [address]  Admission on 11/27/2023, Discharged on 12/01/2023  Component Date Value Ref Range Status   HIV 1 RNA Quant 11/29/2023 12,600  copies/mL Corrected   Comment: (NOTE) The reportable range for this assay is 20 to 10,000,000 copies HIV-1 RNA/mL.    LOG10 HIV-1 RNA 11/29/2023 4.100  log10copy/mL Final   Comment: (NOTE) Performed At: [facility] [address] [place], [place] [id number] Jolene Schimke MD QM:[id number]    Neisseria Gonorrhea 11/30/2023 Negative   Final   Chlamydia 11/30/2023 Negative   Final   Trichomonas 11/30/2023 Negative   Final   Comment 11/30/2023 Normal Reference Ranger Chlamydia - Negative   Final   Comment 11/30/2023 Normal Reference Range Neisseria Gonorrhea - Negative   Final   Comment 11/30/2023 Normal Reference Range Trichomonas - Negative   Final  Admission on 11/26/2023, Discharged on 11/27/2023  Component Date Value Ref Range Status   WBC 11/26/2023 6.5  4.0 - 10.5 K/uL Final   RBC 11/26/2023 4.17 (L)  4.22 - 5.81 MIL/uL Final   Hemoglobin 11/26/2023 12.7 (L)  13.0 - 17.0 g/dL Final   HCT 28/41/3244 38.7 (L)  39.0 - 52.0 % Final   MCV 11/26/2023 92.8  80.0 - 100.0 fL Final   MCH 11/26/2023 30.5  26.0 - 34.0 pg Final   MCHC 11/26/2023 32.8  30.0 - 36.0 g/dL Final   RDW 12/07/7251 14.4  11.5 - 15.5 % Final   Platelets 11/26/2023 229  150 - 400 K/uL Final   nRBC 11/26/2023 0.0  0.0 - 0.2 % Final   Neutrophils Relative % 11/26/2023 55  % Final   Neutro Abs 11/26/2023 3.6  1.7 - 7.7 K/uL Final   Lymphocytes Relative 11/26/2023 32  % Final   Lymphs Abs 11/26/2023 2.1  0.7 - 4.0 K/uL Final   Monocytes Relative 11/26/2023 7  % Final   Monocytes Absolute 11/26/2023 0.4  0.1 - 1.0 K/uL Final   Eosinophils Relative 11/26/2023 5  % Final   Eosinophils Absolute 11/26/2023 0.3  0.0 - 0.5 K/uL Final   Basophils Relative 11/26/2023 1   % Final   Basophils Absolute 11/26/2023 0.0  0.0 - 0.1 K/uL Final   Immature Granulocytes 11/26/2023 0  % Final   Abs Immature Granulocytes 11/26/2023 0.01  0.00 - 0.07 K/uL Final   Performed at [facility], [address], [place], [place] [address]   Sodium 11/26/2023 140  135 - 145 mmol/L Final   Potassium 11/26/2023 4.2  3.5 - 5.1 mmol/L Final   Chloride 11/26/2023 111  98 - 111 mmol/L Final   CO2 11/26/2023 25  22 - 32 mmol/L Final   Glucose, Bld 11/26/2023 86  70 - 99 mg/dL Final   Glucose reference range applies only to samples taken after fasting for at least 8 hours.   BUN 11/26/2023 11  6 - 20 mg/dL Final   Creatinine, Ser 11/26/2023 0.93  0.61 - 1.24 mg/dL Final   Calcium 95/28/4132 9.0  8.9 - 10.3 mg/dL Final   Total Protein 44/12/270 6.5  6.5 - 8.1 g/dL Final   Albumin 53/66/4403 3.6  3.5 - 5.0 g/dL Final   AST 47/42/5956 36  15 - 41 U/L Final   ALT 11/26/2023 25  0 - 44 U/L Final   Alkaline Phosphatase 11/26/2023 51  38 - 126 U/L Final   Total Bilirubin 11/26/2023 0.5  <1.2 mg/dL Final   GFR, Estimated 11/26/2023 >60  >60 mL/min Final   Comment: (NOTE) Calculated using the CKD-EPI Creatinine Equation (2021)    Anion gap 11/26/2023 4 (L)  5 - 15 Final   Performed at [facility], [address], [place], [place] [address]   Alcohol, Ethyl (B) 11/26/2023 <10  <10 mg/dL Final   Comment: (NOTE) Lowest detectable limit for serum alcohol is 10 mg/dL.  For medical purposes only. Performed at [facility], [address] [place], [place] [address]    POC Amphetamine UR 11/26/2023 None Detected  NONE DETECTED (Cut Off Level 1000 ng/mL) Final   POC Secobarbital (BAR) 11/26/2023 None Detected  NONE DETECTED (Cut Off Level 300 ng/mL) Final   POC Buprenorphine (BUP) 11/26/2023 None Detected  NONE DETECTED (Cut Off Level 10 ng/mL) Final   POC Oxazepam (BZO) 11/26/2023 None Detected  NONE DETECTED (Cut Off Level 300 ng/mL) Final   POC Cocaine UR 11/26/2023  None Detected  NONE DETECTED (Cut Off Level 300 ng/mL) Final   POC Methamphetamine UR 11/26/2023 None Detected  NONE DETECTED (Cut Off Level 1000 ng/mL) Final   POC Morphine 11/26/2023 None Detected  NONE DETECTED (Cut Off Level 300 ng/mL) Final   POC Methadone UR 11/26/2023 None Detected  NONE DETECTED (Cut Off Level 300 ng/mL) Final   POC Oxycodone UR 11/26/2023 None Detected  NONE DETECTED (Cut Off Level 100 ng/mL) Final   POC Marijuana UR 11/26/2023 None Detected  NONE DETECTED (Cut Off Level 50 ng/mL) Final   Cholesterol 11/26/2023 148  0 - 200 mg/dL Final   Triglycerides 51/88/4166 65  <150 mg/dL Final   HDL 06/03/1600 43  >40 mg/dL Final   Total CHOL/HDL Ratio 11/26/2023 3.4  RATIO Final   VLDL 11/26/2023 13  0 - 40 mg/dL Final   LDL Cholesterol 11/26/2023 92  0 - 99 mg/dL Final   Comment:        Total Cholesterol/HDL:CHD Risk Coronary Heart Disease Risk Table                     Men   Women  1/2 Average Risk   3.4   3.3  Average Risk       5.0   4.4  2 X Average Risk   9.6   7.1  3 X Average Risk  23.4   11.0  Use the calculated Patient Ratio above and the CHD Risk Table to determine the patient's CHD Risk.        ATP III CLASSIFICATION (LDL):  <100     mg/dL   Optimal  161-096  mg/dL   Near or Above                    Optimal  130-159  mg/dL   Borderline  045-409  mg/dL   High  >811     mg/dL   Very High Performed at [facility], [address], [place], [place] [address]   Admission on 10/23/2023, Discharged on 10/23/2023  Component Date Value Ref Range Status   Sodium 10/23/2023 134 (L)  135 - 145 mmol/L Final   Potassium 10/23/2023 4.4  3.5 - 5.1 mmol/L Final   Chloride 10/23/2023 106  98 - 111 mmol/L Final   CO2 10/23/2023 18 (L)  22 - 32 mmol/L Final   Glucose, Bld 10/23/2023 87  70 - 99 mg/dL Final   Glucose reference range applies only to samples taken after fasting for at least 8 hours.   BUN 10/23/2023 10  6 - 20 mg/dL Final   Creatinine, Ser  10/23/2023 1.06  0.61 - 1.24 mg/dL Final   Calcium 29/56/2130 9.1  8.9 - 10.3 mg/dL Final   Total Protein 86/57/8469 7.6  6.5 - 8.1 g/dL Final   Albumin 62/95/2841 3.9  3.5 - 5.0 g/dL Final   AST 32/44/0102 29  15 - 41 U/L Final   ALT 10/23/2023 16  0 - 44 U/L Final   Alkaline Phosphatase 10/23/2023 51  38 - 126 U/L Final   Total Bilirubin 10/23/2023 0.6  <1.2 mg/dL Final   GFR, Estimated 10/23/2023 >60  >60 mL/min Final   Comment: (NOTE) Calculated using the CKD-EPI Creatinine Equation (2021)    Anion gap 10/23/2023 10  5 - 15 Final   Performed at [facility], [address], [place], [place] [address]   Alcohol, Ethyl (B) 10/23/2023 <10  <10 mg/dL Final   Comment: (NOTE) Lowest detectable limit for serum alcohol is 10 mg/dL.  For medical purposes only. Performed at [facility], [address] [place], [place] [address]    Opiates 10/23/2023 NONE DETECTED  NONE DETECTED Final   Cocaine 10/23/2023 NONE DETECTED  NONE DETECTED Final   Benzodiazepines 10/23/2023 NONE DETECTED  NONE DETECTED Final   Amphetamines 10/23/2023 NONE DETECTED  NONE DETECTED Final   Tetrahydrocannabinol 10/23/2023 NONE DETECTED  NONE DETECTED Final   Barbiturates 10/23/2023 NONE DETECTED  NONE DETECTED Final   Comment: (NOTE) DRUG SCREEN FOR MEDICAL PURPOSES ONLY.  IF CONFIRMATION IS NEEDED FOR ANY PURPOSE, NOTIFY LAB WITHIN 5 DAYS.  LOWEST DETECTABLE LIMITS FOR URINE DRUG SCREEN Drug Class                     Cutoff (ng/mL) Amphetamine and metabolites    1000 Barbiturate and metabolites    200 Benzodiazepine                 200 Opiates and metabolites        300 Cocaine and metabolites        300 THC                            50 Performed at [facility], [address], [place], [place] [address]  WBC 10/23/2023 4.8  4.0 - 10.5 K/uL Final   RBC 10/23/2023 4.96  4.22 - 5.81 MIL/uL Final   Hemoglobin 10/23/2023 15.2  13.0 - 17.0 g/dL Final   HCT 16/09/9603 46.2  39.0 -  52.0 % Final   MCV 10/23/2023 93.1  80.0 - 100.0 fL Final   MCH 10/23/2023 30.6  26.0 - 34.0 pg Final   MCHC 10/23/2023 32.9  30.0 - 36.0 g/dL Final   RDW 54/08/8118 14.4  11.5 - 15.5 % Final   Platelets 10/23/2023 225  150 - 400 K/uL Final   nRBC 10/23/2023 0.0  0.0 - 0.2 % Final   Neutrophils Relative % 10/23/2023 40  % Final   Neutro Abs 10/23/2023 1.9  1.7 - 7.7 K/uL Final   Lymphocytes Relative 10/23/2023 44  % Final   Lymphs Abs 10/23/2023 2.2  0.7 - 4.0 K/uL Final   Monocytes Relative 10/23/2023 9  % Final   Monocytes Absolute 10/23/2023 0.4  0.1 - 1.0 K/uL Final   Eosinophils Relative 10/23/2023 6  % Final   Eosinophils Absolute 10/23/2023 0.3  0.0 - 0.5 K/uL Final   Basophils Relative 10/23/2023 1  % Final   Basophils Absolute 10/23/2023 0.0  0.0 - 0.1 K/uL Final   Immature Granulocytes 10/23/2023 0  % Final   Abs Immature Granulocytes 10/23/2023 0.01  0.00 - 0.07 K/uL Final   Performed at [facility], [address], [place], [place] [address]   Total lymphocyte count 10/23/2023 2,194  1,000 - 4,000 /uL Final   CD4% 10/23/2023 26.20 (L)  33 - 65 % Final   CD4 absolute 10/23/2023 575  400 - 1,790 /uL Final   CD8tox 10/23/2023 34.30  12 - 40 % Final   CD8 T Cell Abs 10/23/2023 753  190 - 1,000 /uL Final   Ratio 10/23/2023 0.76 (L)  1.0 - 3.0 Final   Performed at [facility], [address], [place], [place] [address]    Blood Alcohol level:  Lab Results  Component Value Date   ETH <10 02/18/2024   ETH <10 11/26/2023    Metabolic Disorder Labs: Lab Results  Component Value Date   HGBA1C 5.6 02/18/2024   MPG 114.02 02/18/2024   MPG 122.63 06/13/2023   Lab Results  Component Value Date   PROLACTIN 19.4 06/13/2023   Lab Results  Component Value Date   CHOL 154 02/18/2024   TRIG 63 02/18/2024   HDL 47 02/18/2024   CHOLHDL 3.3 02/18/2024   VLDL 13 02/18/2024   LDLCALC 94 02/18/2024   LDLCALC 92 11/26/2023    Therapeutic Lab  Levels: No results found for: "LITHIUM" No results found for: "VALPROATE" No results found for: "CBMZ"  Physical Findings   AIMS    Flowsheet Row [facility]  AIMS Total Score 0      AUDIT    Flowsheet Row Ad[facility]  Alcohol Use Disorder Identification Test Final Score (AUDIT) 0      PHQ2-9    Flowsheet Row ED from 02/21/2024 in [facility] ED from 02/19/2024 in [facility] Office Visit from 02/04/2022 in [facility] [place] Office Visit from 04/16/2021 in [facility] Of [place]  PHQ-2 Total Score  0 6 0 0  PHQ-9 Total Score 0 26 -- --      Flowsheet Row ED from 02/21/2024 in [facility] from 02/19/2024 in [facility] ED from 02/18/2024 in [facility]  C-SSRS RISK CATEGORY No Risk High Risk High Risk        Psychiatric Specialty Exam: Physical Exam Constitutional:      Appearance: the patient is not toxic-appearing.  Pulmonary:     Effort: Pulmonary effort is normal.  Neurological:     General: No focal deficit present.     Mental Status: the patient is alert and oriented to person, place, and time.   Review of Systems  Respiratory:  Negative for shortness of breath.   Cardiovascular:  Negative for chest pain.  Gastrointestinal:  Negative for abdominal pain, constipation, diarrhea, nausea and vomiting.  Neurological:  Negative for headaches.      BP 104/67 (BP Location: Right Arm)   Pulse 94   Temp 98.2 F (36.8 C) (Oral)   Resp 18   SpO2 100%   General Appearance: Fairly Groomed  Eye Contact:  Good  Speech:  Clear and Coherent  Volume:  Normal   Mood:  Euthymic  Affect:  Congruent  Thought Process:  Coherent  Orientation:  Full (Time, Place, and Person)  Thought Content: Logical   Suicidal Thoughts:  No  Homicidal Thoughts:  No  Memory:  Immediate;   Good  Judgement:  fair  Insight:  fair  Psychomotor Activity:  Normal  Concentration:  Concentration: Good  Recall:  Good  Fund of Knowledge: Good  Language: Good  Akathisia:  No  Handed:  not assessed  AIMS (if indicated): not done  Assets:  Communication Skills Desire for Improvement Financial Resources/Insurance Housing Leisure Time Physical Health  ADL's:  Intact  Cognition: WNL  Sleep:  Fair     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 Patient requested Lexapro be discontinued as he reported sedation as an intolerable side effect to this medication Continue Wellbutrin XL 150 mg daily Continue abilify 10 mg daily Clonidine discontinued due to orthostatic hypotension   Medical Issues Being Addressed:   HIV Continue home biktarvy   Other PRNs: acetaminophen, 650 mg, Q6H PRN alum & mag hydroxide-simeth, 30 mL, Q4H PRN diphenhydrAMINE, 25 mg, Q6H PRN haloperidol, 5 mg, TID PRN  And diphenhydrAMINE, 50 mg, TID PRN haloperidol lactate, 5 mg, TID PRN  And diphenhydrAMINE, 50 mg, TID PRN  And LORazepam, 2 mg, TID PRN haloperidol lactate, 10 mg, TID PRN  And diphenhydrAMINE, 50 mg, TID PRN  And LORazepam, 2 mg, TID PRN fluticasone, 2 spray, Daily PRN guaiFENesin, 600 mg, BID PRN hydrOXYzine, 25 mg, TID PRN loratadine, 10 mg, Daily PRN magnesium hydroxide, 30 mL, Daily PRN naphazoline-pheniramine, 2 drop, QID PRN traZODone, 50 mg, QHS PRN   Other Labs/Imaging Reviewed: CBC unremarkable, repeat CBC on 3/20 is unchanged CMP showing  hyperglycemia 141, repeat on 3/20 is unremarkable A1C 5.6% Ethanol <10 Lipid panel is unremarkable TSH WNL  IVDU labs: RPR non-reactive Hepatitis panel non-reactive  EKG on 02/18/2024: QTc 399    Discharge Planning:   -- Social work and case management to assist with discharge planning and identification of hospital follow-up needs prior to discharge  --  Disposition: Discharge to [facility] on Monday 3/24  -- Discharge Concerns: Need to establish a safety plan; Medication compliance and effectiveness  -- Discharge Goals: Transition to residential rehabilitation;outpatient referrals for mental health follow-up including medication management/psychotherapy     Signed: Carlyn Reichert, MD 02/24/2024 1:21 PM

## 2024-02-24 NOTE — Group Note (Signed)
 Group Topic: Relapse and Recovery  Group Date: 02/24/2024 Start Time: 2000 End Time: 2100 Facilitators: Rae Lips B  Department: [facility]  Number of Participants: 3  Group Focus: abuse issues, acceptance, activities of daily living skills, and dual diagnosis Treatment Modality:  Leisure Development Interventions utilized were leisure development Purpose: express feelings  Name: [name]. Date of Birth: 1985/12/16  MR: [id number]    Level of Participation: active Quality of Participation: attentive Interactions with others: gave feedback Mood/Affect: appropriate Triggers (if applicable): NA Cognition: coherent/clear Progress: Moderate Response: NA Plan: follow-up needed  Patients Problems:  Patient Active Problem List   Diagnosis Date Noted   Schizoaffective disorder, bipolar type (HCC) 02/19/2024   IVDU (intravenous drug user) 01/28/2024   Arm abscess 01/19/2024   Cellulitis 01/17/2024   PTSD (post-traumatic stress disorder) 11/29/2023   Methamphetamine use disorder, severe (HCC) 11/29/2023   Cannabis use disorder 11/29/2023   Long term current use of antipsychotic medication 11/29/2023   Schizoaffective disorder, depressive type (HCC) 11/27/2023   Homelessness 06/13/2023   Suicidal ideation 06/13/2023   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

## 2024-02-24 NOTE — ED Notes (Signed)
 Patient is sleeping. Respirations equal and unlabored, skin warm and dry. No change in assessment or acuity. Routine safety checks conducted according to facility protocol. Will continue to monitor for safety.

## 2024-02-24 NOTE — ED Notes (Signed)
 Pt sleeping at present, no distress noted.  Monitoring for safety.

## 2024-02-24 NOTE — ED Notes (Signed)
 Pt isolates to room, denies SI/ HI/ AVH. Denies c/o withdrawal symptoms. No noted distress. Will continue to monitor for safety

## 2024-02-25 NOTE — Group Note (Signed)
 Group Topic: Change and Accountability  Group Date: 02/25/2024 Start Time: 1722 End Time: 1745 Facilitators: Vonzell Schlatter B  Department: [facility]  Number of Participants: 7  Group Focus: affirmation and daily focus Treatment Modality:  Psychoeducation Interventions utilized were patient education and problem solving Purpose: regain self-worth and reinforce self-care  Name: Kenneth Welch. 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

## 2024-02-25 NOTE — ED Notes (Signed)
 Patient resting with eyes closed in no apparent acute distress. Respirations even and unlabored. Environment secured. Safety checks in place according to facility policy.

## 2024-02-25 NOTE — ED Notes (Signed)
 Pt is in his room resting in bed. Pt denies SI/HI/AVH. No acute distress noted. Will continue to monitor for safety.

## 2024-02-25 NOTE — Group Note (Signed)
 Group Topic: Social Support  Group Date: 02/25/2024 Start Time: 2000 End Time: 2030 Facilitators: Rae Lips B  Department: [facility]  Number of Participants: 3  Group Focus: check in Treatment Modality:  Individual Therapy Interventions utilized were support Purpose: express feelings  Name: Kenneth Welch. Date of Birth: 28-Apr-1986  MR: [id number]    Level of Participation: PT DID NOT ATTEND GROUP Quality of Participation: cooperative Interactions with others: gave feedback Mood/Affect: appropriate Triggers (if applicable): NA Cognition: coherent/clear Progress: Gaining insight Response: NA Plan: patient will be encouraged to go to groups.   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

## 2024-02-25 NOTE — ED Notes (Signed)
 Patient is sleeping. Respirations equal and unlabored, skin warm and dry. No change in assessment or acuity. Routine safety checks conducted according to facility protocol. Will continue to monitor for safety.

## 2024-02-25 NOTE — ED Notes (Signed)
 Pt sleeping at present, no distress noted, monitoring for safety.

## 2024-02-25 NOTE — ED Provider Notes (Cosign Needed Addendum)
 FBC/OBS ASAP Discharge Summary  Date and Time: 02/25/2024 1:22 PM  Name: Kenneth Welch.  MRN:  [id number]   Discharge Diagnoses:  Final diagnoses:  Methamphetamine abuse (HCC)  Stimulant use disorder  IVDU (intravenous drug user)  Substance induced mood disorder (HCC)  Moderate episode of recurrent major depressive disorder (HCC)  Suicidal ideations    Subjective:  Kenneth Welch. Is a 38 y/o male with a history of schizophrenia/schizoaffective disorder, post-traumatic stress disorder (PTSD), and methamphetamine abuse. Patient presented voluntarily to the [facility] on 02/19/2024 with complaint of suicidal ideations and was transferred to [facility] on the same day for crisis stabilization and detox. PMHx is significant for HIV, compliant on biktarvy. UDS on admission is positive for methamphetamine.   Stay Summary:  Uneventful detox. Home Abilify restarted and Wellbutrin added. The patient is scheduled to be transferred to [facility] residential treatment on the morning of 3/24.    The patient denies auditory/visual hallucinations.  The patient reports good mood, appetite, and sleep. They deny suicidal and homicidal thoughts. The patient denies side effects from their medications.  Review of systems as below. The patient denies experiencing any withdrawal symptoms.   Total Time spent with patient: 20 minutes  Past Psychiatric History: as above Past Medical History: as above Family History: none Family Psychiatric History: none Social History: as above Tobacco Cessation:  A prescription for an FDA-approved tobacco cessation medication provided at discharge   Current Medications:  Current Facility-Administered Medications  Medication Dose Route Frequency Provider Last Rate Last Admin   acetaminophen (TYLENOL) tablet 650 mg  650 mg Oral Q6H PRN Carrion-Carrero, Margely, MD       alum & mag hydroxide-simeth (MAALOX/MYLANTA) 200-200-20 MG/5ML suspension 30 mL  30 mL Oral Q4H PRN  Carrion-Carrero, Margely, MD       ARIPiprazole (ABILIFY) tablet 10 mg  10 mg Oral Daily Carrion-Carrero, Margely, MD   10 mg at 02/25/24 0951   bictegravir-emtricitabine-tenofovir AF (BIKTARVY) 50-200-25 MG per tablet 1 tablet  1 tablet Oral Daily Carrion-Carrero, Karle Starch, MD   1 tablet at 02/25/24 0951   buPROPion (WELLBUTRIN XL) 24 hr tablet 150 mg  150 mg Oral Daily Carrion-Carrero, Karle Starch, MD   150 mg at 02/25/24 2130   diphenhydrAMINE (BENADRYL) capsule 25 mg  25 mg Oral Q6H PRN Carrion-Carrero, Margely, MD       haloperidol (HALDOL) tablet 5 mg  5 mg Oral TID PRN Carrion-Carrero, Margely, MD       And   diphenhydrAMINE (BENADRYL) capsule 50 mg  50 mg Oral TID PRN Carrion-Carrero, Margely, MD       haloperidol lactate (HALDOL) injection 5 mg  5 mg Intramuscular TID PRN Carrion-Carrero, Margely, MD       And   diphenhydrAMINE (BENADRYL) injection 50 mg  50 mg Intramuscular TID PRN Carrion-Carrero, Margely, MD       And   LORazepam (ATIVAN) injection 2 mg  2 mg Intramuscular TID PRN Carrion-Carrero, Margely, MD       haloperidol lactate (HALDOL) injection 10 mg  10 mg Intramuscular TID PRN Carrion-Carrero, Margely, MD       And   diphenhydrAMINE (BENADRYL) injection 50 mg  50 mg Intramuscular TID PRN Carrion-Carrero, Margely, MD       And   LORazepam (ATIVAN) injection 2 mg  2 mg Intramuscular TID PRN Carrion-Carrero, Margely, MD       fluticasone (FLONASE) 50 MCG/ACT nasal spray 2 spray  2 spray Each Nare Daily PRN Lorri Frederick, MD  guaiFENesin (MUCINEX) 12 hr tablet 600 mg  600 mg Oral BID PRN Carrion-Carrero, Karle Starch, MD       hydrOXYzine (ATARAX) tablet 25 mg  25 mg Oral TID PRN Lorri Frederick, MD   25 mg at 02/24/24 2106   loratadine (CLARITIN) tablet 10 mg  10 mg Oral Daily PRN Carrion-Carrero, Karle Starch, MD       magnesium hydroxide (MILK OF MAGNESIA) suspension 30 mL  30 mL Oral Daily PRN Carrion-Carrero, Margely, MD       naphazoline-pheniramine  (NAPHCON-A) 0.025-0.3 % ophthalmic solution 2 drop  2 drop Both Eyes QID PRN Carrion-Carrero, Margely, MD       traZODone (DESYREL) tablet 50 mg  50 mg Oral QHS PRN Carrion-Carrero, Karle Starch, MD   50 mg at 02/24/24 2106   Current Outpatient Medications  Medication Sig Dispense Refill   ARIPiprazole (ABILIFY) 10 MG tablet Take 1 tablet (10 mg total) by mouth daily. 30 tablet 0   bictegravir-emtricitabine-tenofovir AF (BIKTARVY) 50-200-25 MG TABS tablet Take 1 tablet by mouth daily. 30 tablet 0   buPROPion (WELLBUTRIN XL) 150 MG 24 hr tablet Take 1 tablet (150 mg total) by mouth daily. 30 tablet 0   fluticasone (FLONASE) 50 MCG/ACT nasal spray Place 2 sprays into both nostrils daily as needed for allergies or rhinitis. 11.1 mL 0   hydrOXYzine (ATARAX) 25 MG tablet Take 1 tablet (25 mg total) by mouth 3 (three) times daily as needed for anxiety. 30 tablet 0   loratadine (CLARITIN) 10 MG tablet Take 1 tablet (10 mg total) by mouth daily as needed for allergies or rhinitis. 30 tablet 0   naphazoline-pheniramine (NAPHCON-A) 0.025-0.3 % ophthalmic solution Place 2 drops into both eyes 4 (four) times daily as needed for eye irritation. 15 mL 0   traZODone (DESYREL) 50 MG tablet Take 1 tablet (50 mg total) by mouth at bedtime as needed for sleep. 30 tablet 0    PTA Medications:  Facility Ordered Medications  Medication   [COMPLETED] sodium chloride 0.9 % bolus 1,000 mL   [COMPLETED] sodium chloride 0.9 % bolus 1,000 mL   magnesium hydroxide (MILK OF MAGNESIA) suspension 30 mL   haloperidol (HALDOL) tablet 5 mg   And   diphenhydrAMINE (BENADRYL) capsule 50 mg   haloperidol lactate (HALDOL) injection 5 mg   And   diphenhydrAMINE (BENADRYL) injection 50 mg   And   LORazepam (ATIVAN) injection 2 mg   haloperidol lactate (HALDOL) injection 10 mg   And   diphenhydrAMINE (BENADRYL) injection 50 mg   And   LORazepam (ATIVAN) injection 2 mg   diphenhydrAMINE (BENADRYL) capsule 25 mg   hydrOXYzine  (ATARAX) tablet 25 mg   traZODone (DESYREL) tablet 50 mg   ARIPiprazole (ABILIFY) tablet 10 mg   bictegravir-emtricitabine-tenofovir AF (BIKTARVY) 50-200-25 MG per tablet 1 tablet   acetaminophen (TYLENOL) tablet 650 mg   alum & mag hydroxide-simeth (MAALOX/MYLANTA) 200-200-20 MG/5ML suspension 30 mL   fluticasone (FLONASE) 50 MCG/ACT nasal spray 2 spray   guaiFENesin (MUCINEX) 12 hr tablet 600 mg   loratadine (CLARITIN) tablet 10 mg   naphazoline-pheniramine (NAPHCON-A) 0.025-0.3 % ophthalmic solution 2 drop   buPROPion (WELLBUTRIN XL) 24 hr tablet 150 mg   PTA Medications  Medication Sig   ARIPiprazole (ABILIFY) 10 MG tablet Take 1 tablet (10 mg total) by mouth daily.   bictegravir-emtricitabine-tenofovir AF (BIKTARVY) 50-200-25 MG TABS tablet Take 1 tablet by mouth daily.   buPROPion (WELLBUTRIN XL) 150 MG 24 hr tablet Take 1 tablet (150 mg  total) by mouth daily.   fluticasone (FLONASE) 50 MCG/ACT nasal spray Place 2 sprays into both nostrils daily as needed for allergies or rhinitis.   hydrOXYzine (ATARAX) 25 MG tablet Take 1 tablet (25 mg total) by mouth 3 (three) times daily as needed for anxiety.   loratadine (CLARITIN) 10 MG tablet Take 1 tablet (10 mg total) by mouth daily as needed for allergies or rhinitis.   naphazoline-pheniramine (NAPHCON-A) 0.025-0.3 % ophthalmic solution Place 2 drops into both eyes 4 (four) times daily as needed for eye irritation.   traZODone (DESYREL) 50 MG tablet Take 1 tablet (50 mg total) by mouth at bedtime as needed for sleep.       02/24/2024    1:20 PM 02/22/2024    3:30 PM 02/20/2024    1:04 PM  Depression screen PHQ 2/9  Decreased Interest 0 2 3  Down, Depressed, Hopeless 0 2 3  PHQ - 2 Score 0 4 6  Altered sleeping 0 2 3  Tired, decreased energy 0 2 3  Change in appetite 0 1 2  Feeling bad or failure about yourself  0 2 3  Trouble concentrating 0 2 3  Moving slowly or fidgety/restless 0 2 3  Suicidal thoughts 0 2 3  PHQ-9 Score 0 17  26  Difficult doing work/chores Not difficult at all Very difficult Extremely dIfficult    Flowsheet Row ED from 02/21/2024 in [facility] Department at [facility] ED from 02/19/2024 in [facility] ED from 02/18/2024 in [facility]  C-SSRS RISK CATEGORY No Risk High Risk High Risk      Musculoskeletal  Strength & Muscle Tone: within normal limits Gait & Station: normal Patient leans: N/A  Psychiatric Specialty Exam  Presentation General Appearance: Appropriate for Environment  Eye Contact:Fair  Speech:Clear and Coherent  Speech Volume:Normal  Handedness:-- (not assessed)   Mood and Affect  Mood:Euthymic  Affect:Congruent   Thought Process  Thought Processes:Coherent; Linear  Descriptions of Associations:Intact  Orientation:Full (Time, Place and Person)  Thought Content:Logical    Hallucinations:Hallucinations: None  Ideas of Reference:None  Suicidal Thoughts:Suicidal Thoughts: No  Homicidal Thoughts:Homicidal Thoughts: No   Sensorium  Memory:Immediate Fair; Recent Fair; Remote Fair  Judgment:Fair  Insight:Fair   Executive Functions  Concentration:Fair  Attention Span:Fair  Recall:Fair  Fund of Knowledge:Fair  Language:Fair   Psychomotor Activity  Psychomotor Activity:Psychomotor Activity: Normal   Assets  Assets:Communication Skills; Resilience   Sleep  Sleep:Sleep: Fair    Physical Exam Constitutional:      Appearance: the patient is not toxic-appearing.  Pulmonary:     Effort: Pulmonary effort is normal.  Neurological:     General: No focal deficit present.     Mental Status: the patient is alert and oriented to person, place, and time.   Review of Systems  Respiratory:  Negative for shortness of breath.   Cardiovascular:  Negative for chest pain.  Gastrointestinal:  Negative for abdominal pain, constipation, diarrhea, nausea and vomiting.   Neurological:  Negative for headaches.    BP 104/67 (BP Location: Right Arm)   Pulse 91   Temp 98.2 F (36.8 C) (Oral)   Resp 18   SpO2 100%   Demographic Factors:  None pertinent   Loss Factors: NA   Historical Factors: NA   Risk Reduction Factors:   Positive social support, Positive therapeutic relationship, and Positive coping skills or problem solving skills   Continued Clinical Symptoms:  Substance use  Cognitive Features That Contribute To Risk:  None     Suicide Risk:  Mild   Plan Of Care/Follow-up recommendations:  Activity as tolerated. Diet as recommended by PCP. Keep all scheduled follow-up appointments as recommended.   Patient is instructed to take all prescribed medications as recommended. Report any side effects or adverse reactions to your outpatient psychiatrist. Patient is instructed to abstain from alcohol and illegal drugs while on prescription medications. In the event of worsening symptoms, patient is instructed to call the crisis hotline, 911, or go to the nearest emergency department for evaluation and treatment.     Patient is also instructed prior to discharge to: Take all medications as prescribed by mental healthcare provider. Report any adverse effects and or reactions from the medicines to outpatient provider promptly. Patient has been instructed & cautioned: To not engage in alcohol and or illegal drug use while on prescription medicines. In the event of worsening symptoms,  patient is instructed to call the crisis hotline, 911 and or go to the nearest ED for appropriate evaluation and treatment of symptoms. To follow-up with primary care provider for other medical issues, concerns and or health care needs    Disposition: To [facility] residential treatment   Carlyn Reichert, MD PGY-3

## 2024-02-25 NOTE — ED Notes (Signed)
 Patient alert & oriented x4. Denies intent to harm self or others when asked. Denies A/VH. Scheduled medications administered with no complications. Patient denies any physical complaints when asked. No acute distress noted. Support and encouragement provided. Routine safety checks conducted per facility protocol. Encouraged patient to notify staff if any thoughts of harm towards self or others arise. Patient verbalizes understanding and agreement.

## 2024-02-26 NOTE — ED Notes (Signed)
 Patient is sleeping. Respirations equal and unlabored, skin warm and dry. No change in assessment or acuity. Routine safety checks conducted according to facility protocol. Will continue to monitor for safety.

## 2024-04-10 ENCOUNTER — Ambulatory Visit (HOSPITAL_COMMUNITY)
Admission: EM | Admit: 2024-04-10 | Discharge: 2024-04-12 | Disposition: A | Discharge status: Home / Self Care | Payer: MEDICAID | Attending: Nurse Practitioner | Admitting: Nurse Practitioner

## 2024-04-10 DIAGNOSIS — Z9151 Personal history of suicidal behavior: Secondary | ICD-10-CM | POA: Insufficient documentation

## 2024-04-10 DIAGNOSIS — Z5900 Homelessness unspecified: Secondary | ICD-10-CM

## 2024-04-10 DIAGNOSIS — F259 Schizoaffective disorder, unspecified: Secondary | ICD-10-CM | POA: Insufficient documentation

## 2024-04-10 DIAGNOSIS — R45851 Suicidal ideations: Secondary | ICD-10-CM | POA: Insufficient documentation

## 2024-04-10 DIAGNOSIS — Z5902 Unsheltered homelessness: Secondary | ICD-10-CM | POA: Insufficient documentation

## 2024-04-10 DIAGNOSIS — F4322 Adjustment disorder with anxiety: Secondary | ICD-10-CM | POA: Insufficient documentation

## 2024-04-10 DIAGNOSIS — Z21 Asymptomatic human immunodeficiency virus [HIV] infection status: Secondary | ICD-10-CM | POA: Insufficient documentation

## 2024-04-10 DIAGNOSIS — F4329 Adjustment disorder with other symptoms: Secondary | ICD-10-CM

## 2024-04-10 DIAGNOSIS — Z79899 Other long term (current) drug therapy: Secondary | ICD-10-CM | POA: Insufficient documentation

## 2024-04-10 LAB — POCT URINE DRUG SCREEN - MANUAL ENTRY (I-SCREEN)
POC Amphetamine UR: POSITIVE — AB
POC Buprenorphine (BUP): NOT DETECTED
POC Cocaine UR: NOT DETECTED
POC Marijuana UR: POSITIVE — AB
POC Methadone UR: NOT DETECTED
POC Methamphetamine UR: POSITIVE — AB
POC Morphine: NOT DETECTED
POC Oxazepam (BZO): NOT DETECTED
POC Oxycodone UR: NOT DETECTED
POC Secobarbital (BAR): NOT DETECTED

## 2024-04-10 MED ORDER — HALOPERIDOL LACTATE 5 MG/ML IJ SOLN: 10.0000 mg | Freq: Three times a day (TID) | INTRAMUSCULAR | Status: DC | PRN

## 2024-04-10 MED ORDER — DIPHENHYDRAMINE HCL 50 MG PO CAPS: 50.0000 mg | ORAL_CAPSULE | Freq: Three times a day (TID) | ORAL | Status: DC | PRN

## 2024-04-10 MED ORDER — ARIPIPRAZOLE 10 MG PO TABS
10.0000 mg | ORAL_TABLET | Freq: Every day | ORAL | Status: DC
  Administered 2024-04-11 – 2024-04-12 (×2): 10 mg via ORAL
  Filled 2024-04-10 (×2): qty 1

## 2024-04-10 MED ORDER — BICTEGRAVIR-EMTRICITAB-TENOFOV 50-200-25 MG PO TABS
1.0000 | ORAL_TABLET | Freq: Every day | ORAL | Status: DC
  Administered 2024-04-11 – 2024-04-12 (×2): 1 via ORAL
  Filled 2024-04-10 (×2): qty 1

## 2024-04-10 MED ORDER — DIPHENHYDRAMINE HCL 50 MG/ML IJ SOLN: 50.0000 mg | Freq: Three times a day (TID) | INTRAMUSCULAR | Status: DC | PRN

## 2024-04-10 MED ORDER — LORAZEPAM 2 MG/ML IJ SOLN: 2.0000 mg | Freq: Three times a day (TID) | INTRAMUSCULAR | Status: DC | PRN

## 2024-04-10 MED ORDER — TRAZODONE HCL 50 MG PO TABS: 50.0000 mg | ORAL_TABLET | Freq: Every evening | ORAL | Status: DC | PRN

## 2024-04-10 MED ORDER — ALUM & MAG HYDROXIDE-SIMETH 200-200-20 MG/5ML PO SUSP
30.0000 mL | ORAL | Status: DC | PRN
Start: 2024-04-10 — End: 2024-04-10

## 2024-04-10 MED ORDER — HYDROXYZINE HCL 25 MG PO TABS: 25.0000 mg | ORAL_TABLET | Freq: Three times a day (TID) | ORAL | Status: DC | PRN

## 2024-04-10 MED ORDER — HALOPERIDOL LACTATE 5 MG/ML IJ SOLN: 5.0000 mg | Freq: Three times a day (TID) | INTRAMUSCULAR | Status: DC | PRN

## 2024-04-10 MED ORDER — MAGNESIUM HYDROXIDE 400 MG/5ML PO SUSP
30.0000 mL | Freq: Every day | ORAL | Status: DC | PRN
Start: 2024-04-10 — End: 2024-04-10

## 2024-04-10 MED ORDER — HALOPERIDOL 5 MG PO TABS: 5.0000 mg | ORAL_TABLET | Freq: Three times a day (TID) | ORAL | Status: DC | PRN

## 2024-04-10 MED ORDER — ACETAMINOPHEN 325 MG PO TABS: 650.0000 mg | ORAL_TABLET | Freq: Four times a day (QID) | ORAL | Status: DC | PRN

## 2024-04-10 MED ORDER — BUPROPION HCL ER (XL) 150 MG PO TB24
150.0000 mg | ORAL_TABLET | Freq: Every day | ORAL | Status: DC
  Administered 2024-04-11 – 2024-04-12 (×2): 150 mg via ORAL
  Filled 2024-04-10 (×2): qty 1

## 2024-04-10 NOTE — ED Provider Notes (Signed)
 [facility] Continuous Assessment Admission H&P  Date: 04/11/24 Patient Name: Kenneth Welch. MRN: [id number] Chief Complaint: " I just got out of jail, I don't have nowhere to stay"  Diagnoses:  Final diagnoses:  Homelessness  Stress and adjustment reaction    HPI: Kenneth Welch. Is a 38 year old male with psychiatric history of IVDU, Methamphetamine use disorder, long term current use of antipsychotic medication, schizophrenia and medical history of HIV, who presented voluntarily as a walk in to [facility], accompanied by a crisis counselor, with initial complaint of SI with a plan to OD on his medications, which he later reports as false and mainly seeking help with housing due to homelessness. He reports being frustrated as he is unable to get a job due to his criminal record and also unable to find a place to sleep at night after he was released from [place]. He is on SSI. He reports being able to stay overnight at the [facility] in the past, but that facility is now only open during the daytime and he has nowhere else to go.  Patient reports he cannot remember exactly what he said to the mobile crisis counselor, and confirms his visit to the [facility] tonight is to seek shelter.  Patient reports he was in jail for trespassing, and damage to personal property from April 3-May 5.  Patient was seen face to face by this provider and chart reviewed.   Patient reports "I was just released from jail 3 days ago, I don't have nowhere to go, I was living in [place] in the past but I went up there after I got out to stay with my brother-in-law and his mother, but it didn't work out, so I came back to [place]. I have stayed in [place] in the past, and I lived in a house that is now foreclosed and is no longer available, now, I have nowhere to stay, and I'm having psychological stress from dealing with all of this, I'm trying to find a job, I'm having a lot of anxiety, I've never been in  this situation to this extent, it is an overwhelming feeling, no solution, no family. [facility] not closed at night times, I also used to stay at the [facility], but I lost my bed, so I couldn't go back there.  My long-term plan is to find a job, it's hard with my criminal record.   Patient endorses illicit substance use; crystal meth since March this year, last use, last night and marijuana use.  He reports being medication adherent since his last [facility] visit (3/19-3/24), and currently takes Abilify , Cogentin , and Wellbutrin .  He is not established with an outpatient psychiatric provider for medication management or therapy.  Support, encouragement, reassurance provided about ongoing stressors.  On evaluation, patient is alert, oriented x 4, and cooperative. Speech is clear, normal rate and coherent. Pt appears fairly groomed. Eye contact is fair. Mood is anxious and hopeless, affect is congruent with mood. Thought process is coherent and goal directed and thought content is WDL. Pt denies SI/HI/AVH. There is no objective indication that the patient is responding to internal stimuli. No delusions elicited during this assessment.    Discussed recommendation for admission to the continuous observation unit for safety monitoring overnight and re-eval in the a.m. Discussed meeting with LCSW in the am for housing resources/assistance. Patient is amenable to discharge to an area homeless shelter and follow up with outpatient psychiatric services for  medication management and therapy.  Patient is provided with opportunity for questions.  He verbalizes understanding and is in agreement.  Total Time spent with patient: 30 minutes  Musculoskeletal  Strength & Muscle Tone: within normal limits Gait & Station: normal Patient leans: N/A  Psychiatric Specialty Exam  Presentation General Appearance:  Fairly Groomed  Eye Contact: Fair  Speech: Clear and Coherent  Speech  Volume: Normal  Handedness: Right   Mood and Affect  Mood: Anxious; Hopeless  Affect: Congruent   Thought Process  Thought Processes: Coherent; Goal Directed  Descriptions of Associations:Intact  Orientation:Full (Time, Place and Person)  Thought Content:WDL  Diagnosis of Schizophrenia or Schizoaffective disorder in past: No  Duration of Psychotic Symptoms: Greater than six months  Hallucinations:Hallucinations: None  Ideas of Reference:None  Suicidal Thoughts:Suicidal Thoughts: No  Homicidal Thoughts:Homicidal Thoughts: No   Sensorium  Memory: Immediate Good  Judgment: Intact  Insight: Present   Executive Functions  Concentration: Good  Attention Span: Good  Recall: Good  Fund of Knowledge: Good  Language: Good   Psychomotor Activity  Psychomotor Activity: Psychomotor Activity: Normal   Assets  Assets: Communication Skills; Desire for Improvement   Sleep  Sleep: Sleep: Fair   Nutritional Assessment (For OBS and FBC admissions only) Has the patient had a weight loss or gain of 10 pounds or more in the last 3 months?: No Has the patient had a decrease in food intake/or appetite?: No Does the patient have dental problems?: No Does the patient have eating habits or behaviors that may be indicators of an eating disorder including binging or inducing vomiting?: No Has the patient recently lost weight without trying?: 0 Has the patient been eating poorly because of a decreased appetite?: 0 Malnutrition Screening Tool Score: 0    Physical Exam Constitutional:      General: He is not in acute distress.    Appearance: He is not diaphoretic.  HENT:     Nose: No congestion.  Cardiovascular:     Rate and Rhythm: Normal rate.  Pulmonary:     Effort: No respiratory distress.  Chest:     Chest wall: No tenderness.  Neurological:     Mental Status: He is alert and oriented to person, place, and time.  Psychiatric:         Attention and Perception: Attention and perception normal.        Mood and Affect: Mood is anxious and depressed.        Speech: Speech normal.        Behavior: Behavior is cooperative.        Thought Content: Thought content normal.    Review of Systems  Constitutional:  Negative for chills, diaphoresis and fever.  HENT:  Negative for congestion.   Eyes:  Negative for discharge.  Respiratory:  Negative for cough, shortness of breath and wheezing.   Cardiovascular:  Negative for chest pain and palpitations.  Gastrointestinal:  Negative for diarrhea, nausea and vomiting.  Neurological:  Negative for dizziness, weakness and headaches.  Psychiatric/Behavioral:  Positive for depression. The patient is nervous/anxious.     Blood pressure 112/72, pulse 74, temperature 97.9 F (36.6 C), temperature source Oral, resp. rate 18, SpO2 99%. There is no height or weight on file to calculate BMI.  Past Psychiatric History: See H & P   Is the patient at risk to self? Yes  Has the patient been a risk to self in the past 6 months? Yes .    Has the  patient been a risk to self within the distant past? Yes   Is the patient a risk to others? No   Has the patient been a risk to others in the past 6 months? Yes   Has the patient been a risk to others within the distant past? Yes   Past Medical History: See Chart  Family History: N/A  Social History: N/A  Last Labs:  Admission on 04/10/2024  Component Date Value Ref Range Status   WBC 04/10/2024 7.7  4.0 - 10.5 K/uL Final   RBC 04/10/2024 4.27  4.22 - 5.81 MIL/uL Final   Hemoglobin 04/10/2024 13.5  13.0 - 17.0 g/dL Final   HCT 16/09/9603 39.8  39.0 - 52.0 % Final   MCV 04/10/2024 93.2  80.0 - 100.0 fL Final   MCH 04/10/2024 31.6  26.0 - 34.0 pg Final   MCHC 04/10/2024 33.9  30.0 - 36.0 g/dL Final   RDW 54/08/8118 14.3  11.5 - 15.5 % Final   Platelets 04/10/2024 162  150 - 400 K/uL Final   nRBC 04/10/2024 0.0  0.0 - 0.2 % Final   Neutrophils  Relative % 04/10/2024 61  % Final   Neutro Abs 04/10/2024 4.7  1.7 - 7.7 K/uL Final   Lymphocytes Relative 04/10/2024 30  % Final   Lymphs Abs 04/10/2024 2.3  0.7 - 4.0 K/uL Final   Monocytes Relative 04/10/2024 7  % Final   Monocytes Absolute 04/10/2024 0.5  0.1 - 1.0 K/uL Final   Eosinophils Relative 04/10/2024 2  % Final   Eosinophils Absolute 04/10/2024 0.1  0.0 - 0.5 K/uL Final   Basophils Relative 04/10/2024 0  % Final   Basophils Absolute 04/10/2024 0.0  0.0 - 0.1 K/uL Final   Immature Granulocytes 04/10/2024 0  % Final   Abs Immature Granulocytes 04/10/2024 0.01  0.00 - 0.07 K/uL Final   Performed at [facility], [address], [place], [place] [address]   Sodium 04/10/2024 143  135 - 145 mmol/L Final   Potassium 04/10/2024 3.7  3.5 - 5.1 mmol/L Final   Chloride 04/10/2024 110  98 - 111 mmol/L Final   CO2 04/10/2024 25  22 - 32 mmol/L Final   Glucose, Bld 04/10/2024 73  70 - 99 mg/dL Final   Glucose reference range applies only to samples taken after fasting for at least 8 hours.   BUN 04/10/2024 26 (H)  6 - 20 mg/dL Final   Creatinine, Ser 04/10/2024 1.16  0.61 - 1.24 mg/dL Final   Calcium 95/62/1308 9.3  8.9 - 10.3 mg/dL Final   Total Protein 65/78/4696 7.6  6.5 - 8.1 g/dL Final   Albumin 29/52/8413 4.2  3.5 - 5.0 g/dL Final   AST 24/40/1027 21  15 - 41 U/L Final   ALT 04/10/2024 14  0 - 44 U/L Final   Alkaline Phosphatase 04/10/2024 47  38 - 126 U/L Final   Total Bilirubin 04/10/2024 0.7  0.0 - 1.2 mg/dL Final   GFR, Estimated 04/10/2024 >60  >60 mL/min Final   Comment: (NOTE) Calculated using the CKD-EPI Creatinine Equation (2021)    Anion gap 04/10/2024 8  5 - 15 Final   Performed at [facility], [address] [place], [place] [address]   POC Amphetamine UR 04/10/2024 Positive (A)  NONE DETECTED (Cut Off Level 1000 ng/mL) Final   POC Secobarbital (BAR) 04/10/2024 None Detected  NONE DETECTED (Cut Off Level 300 ng/mL) Final   POC Buprenorphine (BUP)  04/10/2024 None Detected  NONE DETECTED (Cut Off Level 10 ng/mL) Final   POC Oxazepam (BZO) 04/10/2024 None Detected  NONE DETECTED (Cut Off Level 300 ng/mL) Final   POC Cocaine UR 04/10/2024 None Detected  NONE DETECTED (Cut Off Level 300 ng/mL) Final   POC Methamphetamine UR 04/10/2024 Positive (A)  NONE DETECTED (Cut Off Level 1000 ng/mL) Final   POC Morphine 04/10/2024 None Detected  NONE DETECTED (Cut Off Level 300 ng/mL) Final   POC Methadone UR 04/10/2024 None Detected  NONE DETECTED (Cut Off Level 300 ng/mL) Final   POC Oxycodone UR 04/10/2024 None Detected  NONE DETECTED (Cut Off Level 100 ng/mL) Final   POC Marijuana UR 04/10/2024 Positive (A)  NONE DETECTED (Cut Off Level 50 ng/mL) Final  Admission on 02/21/2024, Discharged on 02/26/2024  Component Date Value Ref Range Status   WBC 02/22/2024 5.7  4.0 - 10.5 K/uL Final   RBC 02/22/2024 4.30  4.22 - 5.81 MIL/uL Final   Hemoglobin 02/22/2024 13.4  13.0 - 17.0 g/dL Final   HCT 78/29/5621 40.3  39.0 - 52.0 % Final   MCV 02/22/2024 93.7  80.0 - 100.0 fL Final   MCH 02/22/2024 31.2  26.0 - 34.0 pg Final   MCHC 02/22/2024 33.3  30.0 - 36.0 g/dL Final   RDW 30/86/5784 14.3  11.5 - 15.5 % Final   Platelets 02/22/2024 214  150 - 400 K/uL Final   nRBC 02/22/2024 0.0  0.0 - 0.2 % Final   Neutrophils Relative % 02/22/2024 42  % Final   Neutro Abs 02/22/2024 2.4  1.7 - 7.7 K/uL Final   Lymphocytes Relative 02/22/2024 50  % Final   Lymphs Abs 02/22/2024 2.8  0.7 - 4.0 K/uL Final   Monocytes Relative 02/22/2024 5  % Final   Monocytes Absolute 02/22/2024 0.3  0.1 - 1.0 K/uL Final   Eosinophils Relative 02/22/2024 3  % Final   Eosinophils Absolute 02/22/2024 0.2  0.0 - 0.5 K/uL Final   Basophils Relative 02/22/2024 0  % Final   Basophils Absolute 02/22/2024 0.0  0.0 - 0.1 K/uL Final   Immature Granulocytes 02/22/2024 0  % Final   Abs Immature Granulocytes 02/22/2024 0.01  0.00 - 0.07 K/uL Final   Performed at [facility], [address] [place], [place] [address]   Sodium 02/22/2024 135  135 - 145 mmol/L Final   Potassium 02/22/2024 3.8  3.5 - 5.1 mmol/L Final   Chloride 02/22/2024 105  98 - 111 mmol/L Final   CO2 02/22/2024 26  22 - 32 mmol/L Final   Glucose, Bld 02/22/2024 89  70 - 99 mg/dL Final   Glucose reference range applies only to samples taken after fasting for at least 8 hours.   BUN 02/22/2024 22 (H)  6 - 20 mg/dL Final   Creatinine, Ser 02/22/2024 1.13  0.61 - 1.24 mg/dL Final   Calcium 52/84/1324 8.5 (L)  8.9 - 10.3 mg/dL Final   Total Protein 40/09/2724 6.6  6.5 - 8.1 g/dL Final   Albumin 36/64/4034 3.2 (L)  3.5 - 5.0 g/dL Final   AST 74/25/9563 14 (L)  15 - 41 U/L Final   ALT 02/22/2024 15  0 - 44 U/L Final   Alkaline Phosphatase 02/22/2024 42  38 - 126 U/L Final   Total Bilirubin 02/22/2024 0.4  0.0 - 1.2 mg/dL Final   GFR, Estimated 02/22/2024 >60  >60 mL/min Final   Comment: (NOTE) Calculated using the CKD-EPI Creatinine Equation (2021)  Anion gap 02/22/2024 4 (L)  5 - 15 Final   Performed at [facility], [address] [place], [place] [address]   TSH 02/22/2024 2.786  0.350 - 4.500 uIU/mL Final   Comment: Performed by a 3rd Generation assay with a functional sensitivity of <=0.01 uIU/mL. Performed at [facility], [address] [place], [place] [address]    RPR Ser Ql 02/22/2024 NON REACTIVE  NON REACTIVE Final   Performed at [facility], [address], [place], [place] [address]   Hepatitis B Surface Ag 02/22/2024 NON REACTIVE  NON REACTIVE Final   HCV Ab 02/22/2024 NON REACTIVE  NON REACTIVE Final   Comment: (NOTE) Nonreactive HCV antibody screen is consistent with no HCV infections,  unless recent infection is suspected or other evidence exists to indicate HCV infection.     Hep A IgM 02/22/2024 NON REACTIVE  NON REACTIVE Final   Hep B C IgM 02/22/2024 NON REACTIVE  NON REACTIVE Final   Performed at [facility], [address] [place], [place]  [address]  Admission on 02/19/2024, Discharged on 02/21/2024  Component Date Value Ref Range Status   Glucose-Capillary 02/21/2024 90  70 - 99 mg/dL Final   Glucose reference range applies only to samples taken after fasting for at least 8 hours.  Admission on 02/18/2024, Discharged on 02/19/2024  Component Date Value Ref Range Status   WBC 02/18/2024 6.6  4.0 - 10.5 K/uL Final   RBC 02/18/2024 4.78  4.22 - 5.81 MIL/uL Final   Hemoglobin 02/18/2024 15.0  13.0 - 17.0 g/dL Final   HCT 46/96/2952 45.0  39.0 - 52.0 % Final   MCV 02/18/2024 94.1  80.0 - 100.0 fL Final   MCH 02/18/2024 31.4  26.0 - 34.0 pg Final   MCHC 02/18/2024 33.3  30.0 - 36.0 g/dL Final   RDW 84/13/2440 14.5  11.5 - 15.5 % Final   Platelets 02/18/2024 238  150 - 400 K/uL Final   nRBC 02/18/2024 0.0  0.0 - 0.2 % Final   Neutrophils Relative % 02/18/2024 57  % Final   Neutro Abs 02/18/2024 3.8  1.7 - 7.7 K/uL Final   Lymphocytes Relative 02/18/2024 31  % Final   Lymphs Abs 02/18/2024 2.0  0.7 - 4.0 K/uL Final   Monocytes Relative 02/18/2024 9  % Final   Monocytes Absolute 02/18/2024 0.6  0.1 - 1.0 K/uL Final   Eosinophils Relative 02/18/2024 2  % Final   Eosinophils Absolute 02/18/2024 0.1  0.0 - 0.5 K/uL Final   Basophils Relative 02/18/2024 1  % Final   Basophils Absolute 02/18/2024 0.0  0.0 - 0.1 K/uL Final   Immature Granulocytes 02/18/2024 0  % Final   Abs Immature Granulocytes 02/18/2024 0.02  0.00 - 0.07 K/uL Final   Performed at [facility], [address], [place], [place] [address]   Sodium 02/18/2024 136  135 - 145 mmol/L Final   Potassium 02/18/2024 4.2  3.5 - 5.1 mmol/L Final   Chloride 02/18/2024 100  98 - 111 mmol/L Final   CO2 02/18/2024 28  22 - 32 mmol/L Final   Glucose, Bld 02/18/2024 141 (H)  70 - 99 mg/dL Final   Glucose reference range applies only to samples taken after fasting for at least 8 hours.   BUN 02/18/2024 15  6 - 20 mg/dL Final   Creatinine, Ser 02/18/2024 1.09  0.61 - 1.24 mg/dL  Final   Calcium 53/66/4403 9.6  8.9 - 10.3 mg/dL Final   Total Protein 16/09/9603 7.9  6.5 - 8.1 g/dL Final   Albumin 54/08/8118 4.0  3.5 - 5.0 g/dL Final   AST 14/78/2956 19  15 - 41 U/L Final   ALT 02/18/2024 21  0 - 44 U/L Final   Alkaline Phosphatase 02/18/2024 54  38 - 126 U/L Final   Total Bilirubin 02/18/2024 0.6  0.0 - 1.2 mg/dL Final   GFR, Estimated 02/18/2024 >60  >60 mL/min Final   Comment: (NOTE) Calculated using the CKD-EPI Creatinine Equation (2021)    Anion gap 02/18/2024 8  5 - 15 Final   Performed at [facility], [address] [place], [place] [address]   Hgb A1c MFr Bld 02/18/2024 5.6  4.8 - 5.6 % Final   Comment: (NOTE) Pre diabetes:          5.7%-6.4%  Diabetes:              >6.4%  Glycemic control for   <7.0% adults with diabetes    Mean Plasma Glucose 02/18/2024 114.02  mg/dL Final   Performed at [facility], [address], [place], [place] [address]   Alcohol, Ethyl (B) 02/18/2024 <10  <10 mg/dL Final   Comment: (NOTE) Lowest detectable limit for serum alcohol is 10 mg/dL.  For medical purposes only. Performed at [facility], [address], [place], [place] [address]    Cholesterol 02/18/2024 154  0 - 200 mg/dL Final   Triglycerides 52/84/1324 63  <150 mg/dL Final   HDL 40/09/2724 47  >40 mg/dL Final   Total CHOL/HDL Ratio 02/18/2024 3.3  RATIO Final   VLDL 02/18/2024 13  0 - 40 mg/dL Final   LDL Cholesterol 02/18/2024 94  0 - 99 mg/dL Final   Comment:        Total Cholesterol/HDL:CHD Risk Coronary Heart Disease Risk Table                     Men   Women  1/2 Average Risk   3.4   3.3  Average Risk       5.0   4.4  2 X Average Risk   9.6   7.1  3 X Average Risk  23.4   11.0        Use the calculated Patient Ratio above and the CHD Risk Table to determine the patient's CHD Risk.        ATP III CLASSIFICATION (LDL):  <100     mg/dL   Optimal  366-440  mg/dL   Near or Above                    Optimal  130-159  mg/dL    Borderline  347-425  mg/dL   High  >956     mg/dL   Very High Performed at [facility], [address], [place], [place] [address]    TSH 02/18/2024 3.451  0.350 - 4.500 uIU/mL Final   Comment: Performed by a 3rd Generation assay with a functional sensitivity of <=0.01 uIU/mL. Performed at [facility], [address], [place], [place] [address]    POC Amphetamine UR 02/18/2024 None Detected  NONE DETECTED (Cut Off Level 1000 ng/mL) Final   POC Secobarbital (BAR) 02/18/2024 None Detected  NONE DETECTED (Cut Off Level 300 ng/mL) Final   POC Buprenorphine (BUP) 02/18/2024 None Detected  NONE DETECTED (Cut Off Level 10 ng/mL)  Final   POC Oxazepam (BZO) 02/18/2024 None Detected  NONE DETECTED (Cut Off Level 300 ng/mL) Final   POC Cocaine UR 02/18/2024 None Detected  NONE DETECTED (Cut Off Level 300 ng/mL) Final   POC Methamphetamine UR 02/18/2024 Positive (A)  NONE DETECTED (Cut Off Level 1000 ng/mL) Final   POC Morphine 02/18/2024 None Detected  NONE DETECTED (Cut Off Level 300 ng/mL) Final   POC Methadone UR 02/18/2024 None Detected  NONE DETECTED (Cut Off Level 300 ng/mL) Final   POC Oxycodone UR 02/18/2024 None Detected  NONE DETECTED (Cut Off Level 100 ng/mL) Final   POC Marijuana UR 02/18/2024 None Detected  NONE DETECTED (Cut Off Level 50 ng/mL) Final  Admission on 01/16/2024, Discharged on 01/19/2024  Component Date Value Ref Range Status   Sodium 01/16/2024 136  135 - 145 mmol/L Final   Potassium 01/16/2024 4.2  3.5 - 5.1 mmol/L Final   Chloride 01/16/2024 101  98 - 111 mmol/L Final   CO2 01/16/2024 25  22 - 32 mmol/L Final   Glucose, Bld 01/16/2024 86  70 - 99 mg/dL Final   Glucose reference range applies only to samples taken after fasting for at least 8 hours.   BUN 01/16/2024 11  6 - 20 mg/dL Final   Creatinine, Ser 01/16/2024 1.07  0.61 - 1.24 mg/dL Final   Calcium 16/09/9603 9.0  8.9 - 10.3 mg/dL Final   Total Protein 54/08/8118 7.6  6.5 - 8.1 g/dL Final    Albumin 14/78/2956 3.7  3.5 - 5.0 g/dL Final   AST 21/30/8657 20  15 - 41 U/L Final   ALT 01/16/2024 18  0 - 44 U/L Final   Alkaline Phosphatase 01/16/2024 51  38 - 126 U/L Final   Total Bilirubin 01/16/2024 0.4  0.0 - 1.2 mg/dL Final   GFR, Estimated 01/16/2024 >60  >60 mL/min Final   Comment: (NOTE) Calculated using the CKD-EPI Creatinine Equation (2021)    Anion gap 01/16/2024 10  5 - 15 Final   Performed at [facility], [address] [place], [place] [address]   Lactic Acid, Venous 01/16/2024 1.2  0.5 - 1.9 mmol/L Final   WBC 01/16/2024 9.0  4.0 - 10.5 K/uL Final   RBC 01/16/2024 4.42  4.22 - 5.81 MIL/uL Final   Hemoglobin 01/16/2024 13.7  13.0 - 17.0 g/dL Final   HCT 29/52/8413 41.4  39.0 - 52.0 % Final   MCV 01/16/2024 93.7  80.0 - 100.0 fL Final   MCH 01/16/2024 31.0  26.0 - 34.0 pg Final   MCHC 01/16/2024 33.1  30.0 - 36.0 g/dL Final   RDW 24/40/1027 14.0  11.5 - 15.5 % Final   Platelets 01/16/2024 302  150 - 400 K/uL Final   nRBC 01/16/2024 0.0  0.0 - 0.2 % Final   Neutrophils Relative % 01/16/2024 59  % Final   Neutro Abs 01/16/2024 5.2  1.7 - 7.7 K/uL Final   Lymphocytes Relative 01/16/2024 31  % Final   Lymphs Abs 01/16/2024 2.8  0.7 - 4.0 K/uL Final   Monocytes Relative 01/16/2024 6  % Final   Monocytes Absolute 01/16/2024 0.5  0.1 - 1.0 K/uL Final   Eosinophils Relative 01/16/2024 4  % Final   Eosinophils Absolute 01/16/2024 0.3  0.0 - 0.5 K/uL Final   Basophils Relative 01/16/2024 0  % Final   Basophils Absolute 01/16/2024 0.0  0.0 - 0.1 K/uL Final   Immature Granulocytes 01/16/2024 0  % Final   Abs Immature  Granulocytes 01/16/2024 0.04  0.00 - 0.07 K/uL Final   Performed at [facility], [address] [place], [place] [address]   Prothrombin Time 01/16/2024 13.4  11.4 - 15.2 seconds Final   INR 01/16/2024 1.0  0.8 - 1.2 Final   Comment: (NOTE) INR goal varies based on device and disease states. Performed at [facility], [address], [place], [place] [address]    Specimen Description 01/16/2024 BLOOD RIGHT ARM   Final   Special Requests 01/16/2024 BOTTLES DRAWN AEROBIC AND ANAEROBIC Blood Culture adequate volume   Final   Culture 01/16/2024    Final                   Value:NO GROWTH 5 DAYS Performed at [facility], [address] [place], [place] [address]    Report Status 01/16/2024 01/22/2024 FINAL   Final   Specimen Description 01/16/2024 BLOOD RIGHT HAND   Final   Special Requests 01/16/2024 BOTTLES DRAWN AEROBIC AND ANAEROBIC Blood Culture adequate volume   Final   Culture  Setup Time 01/16/2024    Final                   Value:GRAM POSITIVE COCCI IN CLUSTERS ANAEROBIC BOTTLE ONLY CRITICAL RESULT CALLED TO, READ BACK BY AND VERIFIED WITH: PHARMD K. HURTH [id number] @ 2121 FH    Culture 01/16/2024  (A)   Final                   Value:STAPHYLOCOCCUS EPIDERMIDIS THE SIGNIFICANCE OF ISOLATING THIS ORGANISM FROM A SINGLE SET OF BLOOD CULTURES WHEN MULTIPLE SETS ARE DRAWN IS UNCERTAIN. PLEASE NOTIFY THE MICROBIOLOGY DEPARTMENT WITHIN ONE WEEK IF SPECIATION AND SENSITIVITIES ARE REQUIRED. Performed at [facility], [address] [place], [place] [address]    Report Status 01/16/2024 01/19/2024 FINAL   Final   CD4 T Cell Abs 01/17/2024 919  400 - 1,790 /uL Final   CD4 % Helper T Cell 01/17/2024 36  33 - 65 % Final   Performed at [facility], [address] [place], [place] [address]   WBC 01/17/2024 7.9  4.0 - 10.5 K/uL Final   RBC 01/17/2024 3.98 (L)  4.22 - 5.81 MIL/uL Final   Hemoglobin 01/17/2024 12.4 (L)  13.0 - 17.0 g/dL Final   HCT 78/46/9629 37.7 (L)  39.0 - 52.0 % Final   MCV 01/17/2024 94.7  80.0 - 100.0 fL Final   MCH 01/17/2024 31.2  26.0 - 34.0 pg Final   MCHC 01/17/2024 32.9  30.0 - 36.0 g/dL Final   RDW 52/84/1324 13.8  11.5 - 15.5 % Final   Platelets 01/17/2024 264  150 - 400 K/uL Final   nRBC 01/17/2024 0.0  0.0 - 0.2 % Final   Performed at [facility],  [address], [place], [place] [address]   Creatinine, Ser 01/17/2024 1.22  0.61 - 1.24 mg/dL Final   GFR, Estimated 01/17/2024 >60  >60 mL/min Final   Comment: (NOTE) Calculated using the CKD-EPI Creatinine Equation (2021) Performed at [facility], [address], [place], [place] [address]    WBC 01/18/2024 4.7  4.0 - 10.5 K/uL Final   RBC 01/18/2024 4.45  4.22 - 5.81 MIL/uL Final   Hemoglobin 01/18/2024 13.7  13.0 - 17.0 g/dL Final   HCT 64/40/3474 41.0  39.0 - 52.0 % Final   MCV 01/18/2024 92.1  80.0 - 100.0 fL  Final   MCH 01/18/2024 30.8  26.0 - 34.0 pg Final   MCHC 01/18/2024 33.4  30.0 - 36.0 g/dL Final   RDW 16/09/9603 13.6  11.5 - 15.5 % Final   Platelets 01/18/2024 272  150 - 400 K/uL Final   nRBC 01/18/2024 0.0  0.0 - 0.2 % Final   Neutrophils Relative % 01/18/2024 43  % Final   Neutro Abs 01/18/2024 2.0  1.7 - 7.7 K/uL Final   Lymphocytes Relative 01/18/2024 43  % Final   Lymphs Abs 01/18/2024 2.0  0.7 - 4.0 K/uL Final   Monocytes Relative 01/18/2024 6  % Final   Monocytes Absolute 01/18/2024 0.3  0.1 - 1.0 K/uL Final   Eosinophils Relative 01/18/2024 7  % Final   Eosinophils Absolute 01/18/2024 0.3  0.0 - 0.5 K/uL Final   Basophils Relative 01/18/2024 1  % Final   Basophils Absolute 01/18/2024 0.0  0.0 - 0.1 K/uL Final   Immature Granulocytes 01/18/2024 0  % Final   Abs Immature Granulocytes 01/18/2024 0.00  0.00 - 0.07 K/uL Final   Performed at [facility], [address] [place], [place] [address]   Sodium 01/18/2024 138  135 - 145 mmol/L Final   Potassium 01/18/2024 4.1  3.5 - 5.1 mmol/L Final   Chloride 01/18/2024 101  98 - 111 mmol/L Final   CO2 01/18/2024 25  22 - 32 mmol/L Final   Glucose, Bld 01/18/2024 89  70 - 99 mg/dL Final   Glucose reference range applies only to samples taken after fasting for at least 8 hours.   BUN 01/18/2024 13  6 - 20 mg/dL Final   Creatinine, Ser 01/18/2024 1.16  0.61 - 1.24 mg/dL Final   Calcium 11/91/4782 9.3  8.9 -  10.3 mg/dL Final   GFR, Estimated 01/18/2024 >60  >60 mL/min Final   Comment: (NOTE) Calculated using the CKD-EPI Creatinine Equation (2021)    Anion gap 01/18/2024 12  5 - 15 Final   Performed at [facility], [address], [place], [place] [address]   Enterococcus faecalis 01/16/2024 NOT DETECTED  NOT DETECTED Final   Enterococcus Faecium 01/16/2024 NOT DETECTED  NOT DETECTED Final   Listeria monocytogenes 01/16/2024 NOT DETECTED  NOT DETECTED Final   Staphylococcus species 01/16/2024 DETECTED (A)  NOT DETECTED Final   Comment: CRITICAL RESULT CALLED TO, READ BACK BY AND VERIFIED WITH: PHARMD K. HURTH [id number] @2121  FH    Staphylococcus aureus (BCID) 01/16/2024 NOT DETECTED  NOT DETECTED Final   Staphylococcus epidermidis 01/16/2024 DETECTED (A)  NOT DETECTED Final   Comment: CRITICAL RESULT CALLED TO, READ BACK BY AND VERIFIED WITH: PHARMD K. HURTH [id number] @2121  FH    Staphylococcus lugdunensis 01/16/2024 NOT DETECTED  NOT DETECTED Final   Streptococcus species 01/16/2024 NOT DETECTED  NOT DETECTED Final   Streptococcus agalactiae 01/16/2024 NOT DETECTED  NOT DETECTED Final   Streptococcus pneumoniae 01/16/2024 NOT DETECTED  NOT DETECTED Final   Streptococcus pyogenes 01/16/2024 NOT DETECTED  NOT DETECTED Final   A.calcoaceticus-baumannii 01/16/2024 NOT DETECTED  NOT DETECTED Final   Bacteroides fragilis 01/16/2024 NOT DETECTED  NOT DETECTED Final   Enterobacterales 01/16/2024 NOT DETECTED  NOT DETECTED Final   Enterobacter cloacae complex 01/16/2024 NOT DETECTED  NOT DETECTED Final   Escherichia coli 01/16/2024 NOT DETECTED  NOT DETECTED Final   Klebsiella aerogenes 01/16/2024 NOT DETECTED  NOT DETECTED Final   Klebsiella oxytoca 01/16/2024 NOT DETECTED  NOT DETECTED Final   Klebsiella pneumoniae 01/16/2024 NOT DETECTED  NOT DETECTED Final   Proteus species 01/16/2024 NOT DETECTED  NOT DETECTED Final   Salmonella species 01/16/2024 NOT DETECTED  NOT DETECTED Final    Serratia marcescens 01/16/2024 NOT DETECTED  NOT DETECTED Final   Haemophilus influenzae 01/16/2024 NOT DETECTED  NOT DETECTED Final   Neisseria meningitidis 01/16/2024 NOT DETECTED  NOT DETECTED Final   Pseudomonas aeruginosa 01/16/2024 NOT DETECTED  NOT DETECTED Final   Stenotrophomonas maltophilia 01/16/2024 NOT DETECTED  NOT DETECTED Final   Candida albicans 01/16/2024 NOT DETECTED  NOT DETECTED Final   Candida auris 01/16/2024 NOT DETECTED  NOT DETECTED Final   Candida glabrata 01/16/2024 NOT DETECTED  NOT DETECTED Final   Candida krusei 01/16/2024 NOT DETECTED  NOT DETECTED Final   Candida parapsilosis 01/16/2024 NOT DETECTED  NOT DETECTED Final   Candida tropicalis 01/16/2024 NOT DETECTED  NOT DETECTED Final   Cryptococcus neoformans/gattii 01/16/2024 NOT DETECTED  NOT DETECTED Final   Methicillin resistance mecA/C 01/16/2024 NOT DETECTED  NOT DETECTED Final   Performed at [facility], [address], [place], [place] [address]   Total lymphocyte count 01/18/2024 2,126  1,000 - 4,000 /uL Final   CD4% 01/18/2024 39.16  33 - 65 % Final   CD4 absolute 01/18/2024 833  400 - 1,790 /uL Final   CD8tox 01/18/2024 34.88  12 - 40 % Final   CD8 T Cell Abs 01/18/2024 742  190 - 1,000 /uL Final   Ratio 01/18/2024 1.12  1.0 - 3.0 Final   Performed at [facility], [address] [place], [place] [address]  Admission on 11/27/2023, Discharged on 12/01/2023  Component Date Value Ref Range Status   HIV 1 RNA Quant 11/29/2023 12,600  copies/mL Corrected   Comment: (NOTE) The reportable range for this assay is 20 to 10,000,000 copies HIV-1 RNA/mL.    LOG10 HIV-1 RNA 11/29/2023 4.100  log10copy/mL Final   Comment: (NOTE) Performed At: [facility] [address] [place], [place] [id number] Pearlean Botts MD NA:[id number]    Neisseria Gonorrhea 11/30/2023 Negative   Final   Chlamydia 11/30/2023 Negative   Final   Trichomonas 11/30/2023 Negative   Final    Comment 11/30/2023 Normal Reference Ranger Chlamydia - Negative   Final   Comment 11/30/2023 Normal Reference Range Neisseria Gonorrhea - Negative   Final   Comment 11/30/2023 Normal Reference Range Trichomonas - Negative   Final  Admission on 11/26/2023, Discharged on 11/27/2023  Component Date Value Ref Range Status   WBC 11/26/2023 6.5  4.0 - 10.5 K/uL Final   RBC 11/26/2023 4.17 (L)  4.22 - 5.81 MIL/uL Final   Hemoglobin 11/26/2023 12.7 (L)  13.0 - 17.0 g/dL Final   HCT 42/70/6237 38.7 (L)  39.0 - 52.0 % Final   MCV 11/26/2023 92.8  80.0 - 100.0 fL Final   MCH 11/26/2023 30.5  26.0 - 34.0 pg Final   MCHC 11/26/2023 32.8  30.0 - 36.0 g/dL Final   RDW 62/83/1517 14.4  11.5 - 15.5 % Final   Platelets 11/26/2023 229  150 - 400 K/uL Final   nRBC 11/26/2023 0.0  0.0 - 0.2 % Final   Neutrophils Relative % 11/26/2023 55  % Final   Neutro Abs 11/26/2023 3.6  1.7 - 7.7 K/uL Final   Lymphocytes Relative 11/26/2023 32  % Final   Lymphs Abs 11/26/2023 2.1  0.7 - 4.0 K/uL Final   Monocytes Relative 11/26/2023 7  % Final   Monocytes Absolute 11/26/2023 0.4  0.1 - 1.0 K/uL  Final   Eosinophils Relative 11/26/2023 5  % Final   Eosinophils Absolute 11/26/2023 0.3  0.0 - 0.5 K/uL Final   Basophils Relative 11/26/2023 1  % Final   Basophils Absolute 11/26/2023 0.0  0.0 - 0.1 K/uL Final   Immature Granulocytes 11/26/2023 0  % Final   Abs Immature Granulocytes 11/26/2023 0.01  0.00 - 0.07 K/uL Final   Performed at [facility], [address], [place], [place] [address]   Sodium 11/26/2023 140  135 - 145 mmol/L Final   Potassium 11/26/2023 4.2  3.5 - 5.1 mmol/L Final   Chloride 11/26/2023 111  98 - 111 mmol/L Final   CO2 11/26/2023 25  22 - 32 mmol/L Final   Glucose, Bld 11/26/2023 86  70 - 99 mg/dL Final   Glucose reference range applies only to samples taken after fasting for at least 8 hours.   BUN 11/26/2023 11  6 - 20 mg/dL Final   Creatinine, Ser 11/26/2023 0.93  0.61 - 1.24 mg/dL Final    Calcium 60/45/4098 9.0  8.9 - 10.3 mg/dL Final   Total Protein 11/91/4782 6.5  6.5 - 8.1 g/dL Final   Albumin 95/62/1308 3.6  3.5 - 5.0 g/dL Final   AST 65/78/4696 36  15 - 41 U/L Final   ALT 11/26/2023 25  0 - 44 U/L Final   Alkaline Phosphatase 11/26/2023 51  38 - 126 U/L Final   Total Bilirubin 11/26/2023 0.5  <1.2 mg/dL Final   GFR, Estimated 11/26/2023 >60  >60 mL/min Final   Comment: (NOTE) Calculated using the CKD-EPI Creatinine Equation (2021)    Anion gap 11/26/2023 4 (L)  5 - 15 Final   Performed at [facility], [address] [place], [place] [address]   Alcohol, Ethyl (B) 11/26/2023 <10  <10 mg/dL Final   Comment: (NOTE) Lowest detectable limit for serum alcohol is 10 mg/dL.  For medical purposes only. Performed at [facility], [address], [place], [place] [address]    POC Amphetamine UR 11/26/2023 None Detected  NONE DETECTED (Cut Off Level 1000 ng/mL) Final   POC Secobarbital (BAR) 11/26/2023 None Detected  NONE DETECTED (Cut Off Level 300 ng/mL) Final   POC Buprenorphine (BUP) 11/26/2023 None Detected  NONE DETECTED (Cut Off Level 10 ng/mL) Final   POC Oxazepam (BZO) 11/26/2023 None Detected  NONE DETECTED (Cut Off Level 300 ng/mL) Final   POC Cocaine UR 11/26/2023 None Detected  NONE DETECTED (Cut Off Level 300 ng/mL) Final   POC Methamphetamine UR 11/26/2023 None Detected  NONE DETECTED (Cut Off Level 1000 ng/mL) Final   POC Morphine 11/26/2023 None Detected  NONE DETECTED (Cut Off Level 300 ng/mL) Final   POC Methadone UR 11/26/2023 None Detected  NONE DETECTED (Cut Off Level 300 ng/mL) Final   POC Oxycodone UR 11/26/2023 None Detected  NONE DETECTED (Cut Off Level 100 ng/mL) Final   POC Marijuana UR 11/26/2023 None Detected  NONE DETECTED (Cut Off Level 50 ng/mL) Final   Cholesterol 11/26/2023 148  0 - 200 mg/dL Final   Triglycerides 40/09/2724 65  <150 mg/dL Final   HDL 36/64/4034 43  >40 mg/dL Final   Total CHOL/HDL Ratio 11/26/2023 3.4   RATIO Final   VLDL 11/26/2023 13  0 - 40 mg/dL Final   LDL Cholesterol 11/26/2023 92  0 - 99 mg/dL Final   Comment:        Total Cholesterol/HDL:CHD Risk Coronary Heart Disease Risk Table  Men   Women  1/2 Average Risk   3.4   3.3  Average Risk       5.0   4.4  2 X Average Risk   9.6   7.1  3 X Average Risk  23.4   11.0        Use the calculated Patient Ratio above and the CHD Risk Table to determine the patient's CHD Risk.        ATP III CLASSIFICATION (LDL):  <100     mg/dL   Optimal  161-096  mg/dL   Near or Above                    Optimal  130-159  mg/dL   Borderline  045-409  mg/dL   High  >811     mg/dL   Very High Performed at [facility], [address], [place], [place] [address]   Admission on 10/23/2023, Discharged on 10/23/2023  Component Date Value Ref Range Status   Sodium 10/23/2023 134 (L)  135 - 145 mmol/L Final   Potassium 10/23/2023 4.4  3.5 - 5.1 mmol/L Final   Chloride 10/23/2023 106  98 - 111 mmol/L Final   CO2 10/23/2023 18 (L)  22 - 32 mmol/L Final   Glucose, Bld 10/23/2023 87  70 - 99 mg/dL Final   Glucose reference range applies only to samples taken after fasting for at least 8 hours.   BUN 10/23/2023 10  6 - 20 mg/dL Final   Creatinine, Ser 10/23/2023 1.06  0.61 - 1.24 mg/dL Final   Calcium 29/56/2130 9.1  8.9 - 10.3 mg/dL Final   Total Protein 86/57/8469 7.6  6.5 - 8.1 g/dL Final   Albumin 62/95/2841 3.9  3.5 - 5.0 g/dL Final   AST 32/44/0102 29  15 - 41 U/L Final   ALT 10/23/2023 16  0 - 44 U/L Final   Alkaline Phosphatase 10/23/2023 51  38 - 126 U/L Final   Total Bilirubin 10/23/2023 0.6  <1.2 mg/dL Final   GFR, Estimated 10/23/2023 >60  >60 mL/min Final   Comment: (NOTE) Calculated using the CKD-EPI Creatinine Equation (2021)    Anion gap 10/23/2023 10  5 - 15 Final   Performed at [facility], [address] [place], [place] [address]   Alcohol, Ethyl (B) 10/23/2023 <10  <10 mg/dL Final   Comment:  (NOTE) Lowest detectable limit for serum alcohol is 10 mg/dL.  For medical purposes only. Performed at [facility], [address] [place], [place] [address]    Opiates 10/23/2023 NONE DETECTED  NONE DETECTED Final   Cocaine 10/23/2023 NONE DETECTED  NONE DETECTED Final   Benzodiazepines 10/23/2023 NONE DETECTED  NONE DETECTED Final   Amphetamines 10/23/2023 NONE DETECTED  NONE DETECTED Final   Tetrahydrocannabinol 10/23/2023 NONE DETECTED  NONE DETECTED Final   Barbiturates 10/23/2023 NONE DETECTED  NONE DETECTED Final   Comment: (NOTE) DRUG SCREEN FOR MEDICAL PURPOSES ONLY.  IF CONFIRMATION IS NEEDED FOR ANY PURPOSE, NOTIFY LAB WITHIN 5 DAYS.  LOWEST DETECTABLE LIMITS FOR URINE DRUG SCREEN Drug Class                     Cutoff (ng/mL) Amphetamine and metabolites    1000 Barbiturate and metabolites    200 Benzodiazepine                 200 Opiates and metabolites  300 Cocaine and metabolites        300 THC                            50 Performed at [facility], [address], [place], [place] [address]    WBC 10/23/2023 4.8  4.0 - 10.5 K/uL Final   RBC 10/23/2023 4.96  4.22 - 5.81 MIL/uL Final   Hemoglobin 10/23/2023 15.2  13.0 - 17.0 g/dL Final   HCT 60/45/4098 46.2  39.0 - 52.0 % Final   MCV 10/23/2023 93.1  80.0 - 100.0 fL Final   MCH 10/23/2023 30.6  26.0 - 34.0 pg Final   MCHC 10/23/2023 32.9  30.0 - 36.0 g/dL Final   RDW 11/91/4782 14.4  11.5 - 15.5 % Final   Platelets 10/23/2023 225  150 - 400 K/uL Final   nRBC 10/23/2023 0.0  0.0 - 0.2 % Final   Neutrophils Relative % 10/23/2023 40  % Final   Neutro Abs 10/23/2023 1.9  1.7 - 7.7 K/uL Final   Lymphocytes Relative 10/23/2023 44  % Final   Lymphs Abs 10/23/2023 2.2  0.7 - 4.0 K/uL Final   Monocytes Relative 10/23/2023 9  % Final   Monocytes Absolute 10/23/2023 0.4  0.1 - 1.0 K/uL Final   Eosinophils Relative 10/23/2023 6  % Final   Eosinophils Absolute 10/23/2023 0.3  0.0 - 0.5 K/uL Final    Basophils Relative 10/23/2023 1  % Final   Basophils Absolute 10/23/2023 0.0  0.0 - 0.1 K/uL Final   Immature Granulocytes 10/23/2023 0  % Final   Abs Immature Granulocytes 10/23/2023 0.01  0.00 - 0.07 K/uL Final   Performed at [facility], [address], [place], [place] [address]   Total lymphocyte count 10/23/2023 2,194  1,000 - 4,000 /uL Final   CD4% 10/23/2023 26.20 (L)  33 - 65 % Final   CD4 absolute 10/23/2023 575  400 - 1,790 /uL Final   CD8tox 10/23/2023 34.30  12 - 40 % Final   CD8 T Cell Abs 10/23/2023 753  190 - 1,000 /uL Final   Ratio 10/23/2023 0.76 (L)  1.0 - 3.0 Final   Performed at [facility], [address], [place], [place] [address]    Allergies: Patient has no known allergies.  Medications:  Facility Ordered Medications  Medication   acetaminophen  (TYLENOL ) tablet 650 mg   haloperidol  (HALDOL ) tablet 5 mg   And   diphenhydrAMINE  (BENADRYL ) capsule 50 mg   haloperidol  lactate (HALDOL ) injection 5 mg   And   diphenhydrAMINE  (BENADRYL ) injection 50 mg   And   LORazepam  (ATIVAN ) injection 2 mg   haloperidol  lactate (HALDOL ) injection 10 mg   And   diphenhydrAMINE  (BENADRYL ) injection 50 mg   And   LORazepam  (ATIVAN ) injection 2 mg   hydrOXYzine  (ATARAX ) tablet 25 mg   traZODone  (DESYREL ) tablet 50 mg   ARIPiprazole  (ABILIFY ) tablet 10 mg   bictegravir-emtricitabine -tenofovir  AF (BIKTARVY ) 50-200-25 MG per tablet 1 tablet   buPROPion  (WELLBUTRIN  XL) 24 hr tablet 150 mg   PTA Medications  Medication Sig   ARIPiprazole  (ABILIFY ) 10 MG tablet Take 1 tablet (10 mg total) by mouth daily.   bictegravir-emtricitabine -tenofovir  AF (BIKTARVY ) 50-200-25 MG TABS tablet Take 1 tablet by mouth daily.   buPROPion  (WELLBUTRIN  XL) 150 MG 24 hr tablet Take 1 tablet (150 mg total) by mouth daily.   fluticasone  (FLONASE ) 50 MCG/ACT nasal  spray Place 2 sprays into both nostrils daily as needed for allergies or rhinitis.   hydrOXYzine  (ATARAX ) 25  MG tablet Take 1 tablet (25 mg total) by mouth 3 (three) times daily as needed for anxiety.   loratadine  (CLARITIN ) 10 MG tablet Take 1 tablet (10 mg total) by mouth daily as needed for allergies or rhinitis.   naphazoline-pheniramine (NAPHCON-A) 0.025-0.3 % ophthalmic solution Place 2 drops into both eyes 4 (four) times daily as needed for eye irritation.   traZODone  (DESYREL ) 50 MG tablet Take 1 tablet (50 mg total) by mouth at bedtime as needed for sleep.      Medical Decision Making  Recommend admission to the continuous observation unit overnight.  Recommend meeting with a LCSW for housing assistance.  Patient was just released from jail 5/5 and has been experiencing homelessness since then.  He arrived at the [facility] tonight seeking shelter, reporting he is not aware of any other area homeless shelters since the [facility] is now closed at nighttime and he is unable to return to [employer].  Patient denies SI/HI/AVH or paranoia.  Patient does not meet inpatient psychiatric admission criteria or IVC criteria at this time.  He endorses feelings of being frustrated, hopeless, anxious and overwhelmed. Patient will benefit from a overnight stay, as he is unsafe to be discharged tonight.   Patient is amenable to discharge to homeless shelter after meeting with LCSW. He will also need resources of outpatient psych for medication management and therapy.   Lab Orders         CBC with Differential/Platelet         Comprehensive metabolic panel         POCT Urine Drug Screen - (I-Screen)      Home medication reordered - Abilify  10 mg p.o. daily for mood stabilization -Biktarvy  1 tablet p.o. daily for HIV disease - Wellbutrin  XL 150 mg p.o. daily for depressive symptoms  Prn Meds - Tylenol , Atarax , trazodone   -As needed agitation protocol medications  Recommendations  Based on my evaluation the patient does not appear to have an emergency medical condition.  Recommend admission to the continuous  observation unit overnight for safety.  Recommend meeting with a LCSW for housing assistance.  Sandralee Crow, NP 04/11/24  12:58 AM

## 2024-04-10 NOTE — BH Assessment (Signed)
 Comprehensive Clinical Assessment (CCA) Note  04/11/2024 Kenneth Welch [id number]  Disposition: [name], NP, recommends continuous observation for safety and stabilization with psych reassessment in the AM.   The patient demonstrates the following risk factors for suicide: Chronic risk factors for suicide include: psychiatric disorder of schizophrenia, substance use disorder, and previous suicide attempts 06/2023 attempted overdose on handful of prescription pills. Acute risk factors for suicide include: social withdrawal/isolation. Protective factors for this patient include: responsibility to others (children, family) and hope for the future. Considering these factors, the overall suicide risk at this point appears to be high. Patient is not appropriate for outpatient follow up.  Kenneth Welch is a 38 year old voluntary walk-in at [facility] due to [facility] with plan to overdose on psych medications. Patient denied HI and psychosis. Patient was brought in by mobile crisis. Patient reports he feeling suicidal with a plan to overdose on prescription pills. Patient reports he was in the park, felt suicidal and called mobile crisis. Patient reports staying at a friends house last night. Patient reports he has been homeless for 2 months. Patient also admits to using 1 gram of crystal meth and 1 marijuana joint, with last usage of both on 04/09/24.  Patient reports worsening depressive symptoms. Patient reports history of suicide attempt 06/2023 by overdosing on a handful of pills. Patient denied history of self-harming behaviors. Patient reports Dr. Arfeen prescribed his psych medications and that some are not working. Patient is on disability due to mental health. Patient denied access to guns. Patient was calm and cooperative during assessment.  Chief Complaint:  Chief Complaint  Patient presents with   Suicidal   Visit Diagnosis:  Major depressive disorder    CCA Screening, Triage and  Referral (STR)  Patient Reported Information How did you hear about us ? Other (Comment)  What Is the Reason for Your Visit/Call Today? Kenneth Welch is a 38 year old voluntary walk-in at [facility] due to [facility] with plan to overdose on psych medications. Patient denied HI and psychosis. Patient was brought in by mobile crisis. Patient reports he feeling suicidal with a plan to overdose on prescription pills. Patient reports he was in the park, felt suicidal and called mobile crisis. Patient reports staying at a friends house last night. Patient reports he has been homeless for 2 months. Patient also admits to using 1 gram of crystal meth and 1 marijuana joint, with last usage of both on 04/09/24.  Patient reports worsening depressive symptoms. Patient reports history of suicide attempt 06/2023 by overdosing on a handful of pills. Patient denied history of self-harming behaviors. Patient reports Dr. Arfeen prescribed his psych medications and that some are not working. Patient denied access to guns. Patient was calm and cooperative during assessment.  How Long Has This Been Causing You Problems? 1-6 months  What Do You Feel Would Help You the Most Today? Treatment for Depression or other mood problem   Have You Recently Had Any Thoughts About Hurting Yourself? Yes  Are You Planning to Commit Suicide/Harm Yourself At This time? No   Flowsheet Row ED from 04/10/2024 in [facility] ED from 02/21/2024 in [facility] Emergency Department at [facility] ED from 02/19/2024 in [facility]  C-SSRS RISK CATEGORY High Risk No Risk High Risk       Have you Recently Had Thoughts About Hurting Someone Kenneth Welch? No  Are You Planning to Harm Someone at This Time? No  Explanation: n/a  Have You Used Any Alcohol or Drugs in the Past 24 Hours? Yes  How Long Ago Did You Use Drugs or Alcohol? N/a What Did You Use and How Much? meth and  marijuana   Do You Currently Have a Therapist/Psychiatrist? No  Name of Therapist/Psychiatrist:    Have You Been Recently Discharged From Any Office Practice or Programs? No  Explanation of Discharge From Practice/Program: denies     CCA Screening Triage Referral Assessment Type of Contact: Face-to-Face  Telemedicine Service Delivery:  n/a Is this Initial or Reassessment?  N/a Date Telepsych consult ordered in [facility]:  N/a  Time Telepsych consult ordered in [facility]:   N/a Location of Assessment: [facility]  Provider Location: [facility]   Collateral Involvement: None   Does Patient Have a Automotive engineer Guardian? No  Legal Guardian Contact Information: denies having a legal guardian  Copy of Legal Guardianship Form: -- (denies having a legal guardian)  Legal Guardian Notified of Arrival: -- (denies having a legal guardian)  Legal Guardian Notified of Pending Discharge: -- (denies having a legal guardian)  If Minor and Not Living with Parent(s), Who has Custody? adult  Is CPS involved or ever been involved? Never  Is APS involved or ever been involved? Never   Patient Determined To Be At Risk for Harm To Self or Others Based on Review of Patient Reported Information or Presenting Complaint? No  Method: No Plan  Availability of Means: No access or NA  Intent: Vague intent or NA  Notification Required: No need or identified person  Additional Information for Danger to Others Potential: -- (N/A)  Additional Comments for Danger to Others Potential: na  Are There Guns or Other Weapons in Your Home? No (denied)  Types of Guns/Weapons: denies access to weapons  Are These Weapons Safely Secured?                            -- (denies access to weapons)  Who Could Verify You Are Able To Have These Secured: denies access to weapons  Do You Have any Outstanding Charges, Pending Court Dates, Parole/Probation? Pt reports warrant for  his arrest due to damaging property  Contacted To Inform of Risk of Harm To Self or Others: Other: Comment (N/A)    Does Patient Present under Involuntary Commitment? No    [place] of Residence: [place]   Patient Currently Receiving the Following Services: Not Receiving Services   Determination of Need: Emergent (2 hours)   Options For Referral: Inpatient Hospitalization; Medication Management; Outpatient Therapy; BH Urgent Care     CCA Biopsychosocial Patient Reported Schizophrenia/Schizoaffective Diagnosis in Past: No   Strengths: asking for help   Mental Health Symptoms Depression:  Hopelessness; Sleep (too much or little); Tearfulness; Irritability; Increase/decrease in appetite   Duration of Depressive symptoms:    Mania:  None   Anxiety:   Tension; Worrying   Psychosis:  None   Duration of Psychotic symptoms:    Trauma:  None   Obsessions:  None   Compulsions:  None   Inattention:  None   Hyperactivity/Impulsivity:  None   Oppositional/Defiant Behaviors:  None   Emotional Irregularity:  None   Other Mood/Personality Symptoms:  N/A    Mental Status Exam Appearance and self-care  Stature:  Tall   Weight:  Average weight   Clothing:  Casual (Scrubs)   Grooming:  Normal   Cosmetic use:  None  Posture/gait:  Normal; Tense   Motor activity:  Not Remarkable   Sensorium  Attention:  Normal   Concentration:  Normal   Orientation:  X5   Recall/memory:  Normal   Affect and Mood  Affect:  Flat   Mood:  Anxious (Calm)   Relating  Eye contact:  Normal   Facial expression:  Constricted; Anxious   Attitude toward examiner:  Cooperative   Thought and Language  Speech flow: Normal   Thought content:  Appropriate to Mood and Circumstances   Preoccupation:  None   Hallucinations:  None   Organization:  Intact   [employer] of Knowledge:  Average   Intelligence:  Average   Abstraction:  Normal    Judgement:  Poor   Reality Testing:  Adequate   Insight:  Poor; Gaps   Decision Making:  Impulsive   Social Functioning  Social Maturity:  Impulsive   Social Judgement:  Heedless; "Street Smart"   Stress  Stressors:  Transitions; Other (Comment) (mental health)   Coping Ability:  Overwhelmed; Exhausted   Skill Deficits:  Decision making   Supports:  Support needed     Religion:    Leisure/Recreation:    Exercise/Diet:     CCA Employment/Education Employment/Work Situation:    Education:     CCA Family/Childhood History Family and Relationship History:    Childhood History:          CCA Substance Use Alcohol/Drug Use:                           ASAM's:  Six Dimensions of Multidimensional Assessment  Dimension 1:  Acute Intoxication and/or Withdrawal Potential:      Dimension 2:  Biomedical Conditions and Complications:      Dimension 3:  Emotional, Behavioral, or Cognitive Conditions and Complications:     Dimension 4:  Readiness to Change:     Dimension 5:  Relapse, Continued use, or Continued Problem Potential:     Dimension 6:  Recovery/Living Environment:     ASAM Severity Score:    ASAM Recommended Level of Treatment:     Substance use Disorder (SUD)    Recommendations for Services/Supports/Treatments:    Disposition Recommendation per psychiatric provider:  Recommends continuous observation.    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     Referrals to Alternative Service(s): Referred to Alternative Service(s):   Place:   Date:   Time:    Referred to Alternative Service(s):   Place:   Date:   Time:    Referred to Alternative Service(s):   Place:   Date:   Time:    Referred to Alternative Service(s):   Place:  Date:   Time:     Adelfa Adolph, LCMHC

## 2024-04-10 NOTE — BH Assessment (Incomplete)
 Kenneth Welch is a 38 year old voluntary walk-in at [facility] due to [facility] with plan to overdose on psych medications. Patient denied HI and psychosis. Patient reports he feeling suicidal

## 2024-04-11 ENCOUNTER — Other Ambulatory Visit: Payer: Self-pay

## 2024-04-11 LAB — CBC WITH DIFFERENTIAL/PLATELET
Abs Immature Granulocytes: 0.01 10*3/uL (ref 0.00–0.07)
Basophils Absolute: 0 10*3/uL (ref 0.0–0.1)
Basophils Relative: 0 %
Eosinophils Absolute: 0.1 10*3/uL (ref 0.0–0.5)
Eosinophils Relative: 2 %
HCT: 39.8 % (ref 39.0–52.0)
Hemoglobin: 13.5 g/dL (ref 13.0–17.0)
Immature Granulocytes: 0 %
Lymphocytes Relative: 30 %
Lymphs Abs: 2.3 10*3/uL (ref 0.7–4.0)
MCH: 31.6 pg (ref 26.0–34.0)
MCHC: 33.9 g/dL (ref 30.0–36.0)
MCV: 93.2 fL (ref 80.0–100.0)
Monocytes Absolute: 0.5 10*3/uL (ref 0.1–1.0)
Monocytes Relative: 7 %
Neutro Abs: 4.7 10*3/uL (ref 1.7–7.7)
Neutrophils Relative %: 61 %
Platelets: 162 10*3/uL (ref 150–400)
RBC: 4.27 MIL/uL (ref 4.22–5.81)
RDW: 14.3 % (ref 11.5–15.5)
WBC: 7.7 10*3/uL (ref 4.0–10.5)
nRBC: 0 % (ref 0.0–0.2)

## 2024-04-11 LAB — COMPREHENSIVE METABOLIC PANEL WITH GFR
ALT: 14 U/L (ref 0–44)
AST: 21 U/L (ref 15–41)
Albumin: 4.2 g/dL (ref 3.5–5.0)
Alkaline Phosphatase: 47 U/L (ref 38–126)
Anion gap: 8 (ref 5–15)
BUN: 26 mg/dL — ABNORMAL HIGH (ref 6–20)
CO2: 25 mmol/L (ref 22–32)
Calcium: 9.3 mg/dL (ref 8.9–10.3)
Chloride: 110 mmol/L (ref 98–111)
Creatinine, Ser: 1.16 mg/dL (ref 0.61–1.24)
GFR, Estimated: >60 mL/min (ref >60)
Glucose, Bld: 73 mg/dL (ref 70–99)
Potassium: 3.7 mmol/L (ref 3.5–5.1)
Sodium: 143 mmol/L (ref 135–145)
Total Bilirubin: 0.7 mg/dL (ref 0.0–1.2)
Total Protein: 7.6 g/dL (ref 6.5–8.1)

## 2024-04-11 NOTE — ED Notes (Signed)
Patient observed resting quietly, eyes closed. Respirations equal and unlabored. Will continue to monitor for safety.  

## 2024-04-11 NOTE — Progress Notes (Signed)
   04/10/24 2220  [facility]UC Triage Screening (Walk-ins at [facility] only)  How Did You Hear About Us ? Other (Comment)  What Is the Reason for Your Visit/Call Today? Kenneth Welch is a 38 year old voluntary walk-in at [facility] due to [facility] with plan to overdose on psych medications. Patient denied HI and psychosis. Patient was brought in by mobile crisis. Patient reports he feeling suicidal with a plan to overdose on prescription pills. Patient reports he was in the park, felt suicidal and called mobile crisis. Patient reports staying at a friends house last night. Patient reports he has been homeless for 2 months. Patient also admits to using 1 gram of crystal meth and 1 marijuana joint, with last usage of both on 04/09/24.  Patient reports worsening depressive symptoms. Patient reports history of suicide attempt 06/2023 by overdosing on a handful of pills. Patient denied history of self-harming behaviors. Patient reports Dr. Arfeen prescribed his psych medications and that some are not working. Patient denied access to guns. Patient was calm and cooperative during assessment.  How Long Has This Been Causing You Problems? 1-6 months  Have You Recently Had Any Thoughts About Hurting Yourself? Yes  How long ago did you have thoughts about hurting yourself? earlier today  Are You Planning to Commit Suicide/Harm Yourself At This time? No  Have you Recently Had Thoughts About Hurting Someone [name]? No  Are You Planning To Harm Someone At This Time? No  Physical Abuse Denies  Verbal Abuse Denies  Sexual Abuse Denies  Exploitation of patient/patient's resources Denies  Self-Neglect Denies  Possible abuse reported to:  (n/a)  Are you currently experiencing any auditory, visual or other hallucinations? No  Have You Used Any Alcohol or Drugs in the Past 24 Hours? Yes  What Did You Use and How Much? meth and marijuana  Do you have any current medical co-morbidities that require immediate attention? No  Clinician  description of patient physical appearance/behavior: neat / cooperative  What Do You Feel Would Help You the Most Today? Treatment for Depression or other mood problem  If access to [facility] Urgent Care was not available, would you have sought care in the Emergency Department? Yes  Determination of Need Emergent (2 hours)  Options For Referral Inpatient Hospitalization;Medication Management;Outpatient Therapy;BH Urgent Care  Determination of Need filed? Yes    Flowsheet Row ED from 04/10/2024 in [facility] ED from 02/21/2024 in [facility] Emergency Department at [facility] ED from 02/19/2024 in [facility]  C-SSRS RISK CATEGORY High Risk No Risk High Risk

## 2024-04-11 NOTE — ED Provider Notes (Signed)
 Behavioral Health Progress Note  Date and Time: 04/11/2024 11:39 AM Name: Kenneth Welch. MRN:  [id number]  Subjective:  Kenneth Welch. Is a 37 year old male with psychiatric history of IVDU, Methamphetamine use disorder, long term current use of antipsychotic medication, schizophrenia and medical history of HIV, who presented voluntarily as a walk in to [facility], accompanied by a crisis counselor, with initial complaint of SI with a plan to OD on his medications, which he later reports as false and mainly seeking help with housing due to homelessness. He reports being frustrated as he is unable to get a job due to his criminal record and also unable to find a place to sleep at night after he was released from [place]. He is on SSI. He reports being able to stay overnight at the [facility] in the past, but that facility is now only open during the daytime and he has nowhere else to go.   Patient is seen and examined face-to-face sitting up in his bed.  Chart reviewed and findings shared with the treatment team and consult with attending psychiatrist Dr. Docia Freeman   Objective: Patient presents alert, oriented to person, place, time, and situation.  Speech is clear, coherent but low in volume.  Patient reports that his mood is to find a shelter to return to, due to losing his car that he used to live in.  Active listening and emotional support provided for ongoing stressors.  Reports anxiety of #7/10, depression of #9/10, with 10 being high severity.  Encouraged patient to receive as needed for anxiety from nursing staff, and to be compliant with his antidepressive medicine.  Able to respond to the assessment question as minimally.  Obviously not responding to internal or external stimuli at this time.  He denies SI, HI, or AVH.  Vital signs without critical values.  Patient continues to require monitoring for safety in the observational unit until appropriate placement is found.  Diagnosis:  Final  diagnoses:  Homelessness  Stress and adjustment reaction    Total Time spent with patient: 45 minutes  Past Psychiatric History: See H&P Past Medical History: See H&P Family History: See H&P Family Psychiatric  History: See H&P Social History: See H&P  Additional Social History:   Sleep: Good  Appetite:  Good  Current Medications:  Current Facility-Administered Medications  Medication Dose Route Frequency Provider Last Rate Last Admin   acetaminophen  (TYLENOL ) tablet 650 mg  650 mg Oral Q6H PRN Onuoha, Chinwendu V, NP       ARIPiprazole  (ABILIFY ) tablet 10 mg  10 mg Oral Daily Onuoha, Chinwendu V, NP   10 mg at 04/11/24 0919   bictegravir-emtricitabine -tenofovir  AF (BIKTARVY ) 50-200-25 MG per tablet 1 tablet  1 tablet Oral Daily Onuoha, Chinwendu V, NP   1 tablet at 04/11/24 1914   buPROPion  (WELLBUTRIN  XL) 24 hr tablet 150 mg  150 mg Oral Daily Onuoha, Chinwendu V, NP   150 mg at 04/11/24 0919   haloperidol  (HALDOL ) tablet 5 mg  5 mg Oral TID PRN Onuoha, Chinwendu V, NP       And   diphenhydrAMINE  (BENADRYL ) capsule 50 mg  50 mg Oral TID PRN Onuoha, Chinwendu V, NP       haloperidol  lactate (HALDOL ) injection 5 mg  5 mg Intramuscular TID PRN Onuoha, Chinwendu V, NP       And   diphenhydrAMINE  (BENADRYL ) injection 50 mg  50 mg Intramuscular TID PRN Onuoha, Chinwendu V, NP  And   LORazepam  (ATIVAN ) injection 2 mg  2 mg Intramuscular TID PRN Onuoha, Chinwendu V, NP       haloperidol  lactate (HALDOL ) injection 10 mg  10 mg Intramuscular TID PRN Onuoha, Chinwendu V, NP       And   diphenhydrAMINE  (BENADRYL ) injection 50 mg  50 mg Intramuscular TID PRN Onuoha, Chinwendu V, NP       And   LORazepam  (ATIVAN ) injection 2 mg  2 mg Intramuscular TID PRN Onuoha, Chinwendu V, NP       hydrOXYzine  (ATARAX ) tablet 25 mg  25 mg Oral TID PRN Onuoha, Chinwendu V, NP       traZODone  (DESYREL ) tablet 50 mg  50 mg Oral QHS PRN Onuoha, Chinwendu V, NP       Current Outpatient Medications   Medication Sig Dispense Refill   ARIPiprazole  (ABILIFY ) 10 MG tablet Take 1 tablet (10 mg total) by mouth daily. 30 tablet 0   bictegravir-emtricitabine -tenofovir  AF (BIKTARVY ) 50-200-25 MG TABS tablet Take 1 tablet by mouth daily. 30 tablet 0   buPROPion  (WELLBUTRIN  XL) 150 MG 24 hr tablet Take 1 tablet (150 mg total) by mouth daily. 30 tablet 0   hydrOXYzine  (ATARAX ) 25 MG tablet Take 1 tablet (25 mg total) by mouth 3 (three) times daily as needed for anxiety. 30 tablet 0   Naphazoline HCl (CLEAR EYES OP) Place 2 drops into both eyes 4 (four) times daily as needed (For eye irritation).     traZODone  (DESYREL ) 50 MG tablet Take 1 tablet (50 mg total) by mouth at bedtime as needed for sleep. 30 tablet 0   fluticasone  (FLONASE ) 50 MCG/ACT nasal spray Place 2 sprays into both nostrils daily as needed for allergies or rhinitis. (Patient not taking: Reported on 04/11/2024) 11.1 mL 0   loratadine  (CLARITIN ) 10 MG tablet Take 1 tablet (10 mg total) by mouth daily as needed for allergies or rhinitis. (Patient not taking: Reported on 04/11/2024) 30 tablet 0    Labs  Lab Results:  Admission on 04/10/2024  Component Date Value Ref Range Status   WBC 04/10/2024 7.7  4.0 - 10.5 K/uL Final   RBC 04/10/2024 4.27  4.22 - 5.81 MIL/uL Final   Hemoglobin 04/10/2024 13.5  13.0 - 17.0 g/dL Final   HCT 65/78/4696 39.8  39.0 - 52.0 % Final   MCV 04/10/2024 93.2  80.0 - 100.0 fL Final   MCH 04/10/2024 31.6  26.0 - 34.0 pg Final   MCHC 04/10/2024 33.9  30.0 - 36.0 g/dL Final   RDW 29/52/8413 14.3  11.5 - 15.5 % Final   Platelets 04/10/2024 162  150 - 400 K/uL Final   nRBC 04/10/2024 0.0  0.0 - 0.2 % Final   Neutrophils Relative % 04/10/2024 61  % Final   Neutro Abs 04/10/2024 4.7  1.7 - 7.7 K/uL Final   Lymphocytes Relative 04/10/2024 30  % Final   Lymphs Abs 04/10/2024 2.3  0.7 - 4.0 K/uL Final   Monocytes Relative 04/10/2024 7  % Final   Monocytes Absolute 04/10/2024 0.5  0.1 - 1.0 K/uL Final   Eosinophils  Relative 04/10/2024 2  % Final   Eosinophils Absolute 04/10/2024 0.1  0.0 - 0.5 K/uL Final   Basophils Relative 04/10/2024 0  % Final   Basophils Absolute 04/10/2024 0.0  0.0 - 0.1 K/uL Final   Immature Granulocytes 04/10/2024 0  % Final   Abs Immature Granulocytes 04/10/2024 0.01  0.00 - 0.07 K/uL Final   Performed at  [facility], [address], [place], [place] [address]   Sodium 04/10/2024 143  135 - 145 mmol/L Final   Potassium 04/10/2024 3.7  3.5 - 5.1 mmol/L Final   Chloride 04/10/2024 110  98 - 111 mmol/L Final   CO2 04/10/2024 25  22 - 32 mmol/L Final   Glucose, Bld 04/10/2024 73  70 - 99 mg/dL Final   Glucose reference range applies only to samples taken after fasting for at least 8 hours.   BUN 04/10/2024 26 (H)  6 - 20 mg/dL Final   Creatinine, Ser 04/10/2024 1.16  0.61 - 1.24 mg/dL Final   Calcium 09/81/1914 9.3  8.9 - 10.3 mg/dL Final   Total Protein 78/29/5621 7.6  6.5 - 8.1 g/dL Final   Albumin 30/86/5784 4.2  3.5 - 5.0 g/dL Final   AST 69/62/9528 21  15 - 41 U/L Final   ALT 04/10/2024 14  0 - 44 U/L Final   Alkaline Phosphatase 04/10/2024 47  38 - 126 U/L Final   Total Bilirubin 04/10/2024 0.7  0.0 - 1.2 mg/dL Final   GFR, Estimated 04/10/2024 >60  >60 mL/min Final   Comment: (NOTE) Calculated using the CKD-EPI Creatinine Equation (2021)    Anion gap 04/10/2024 8  5 - 15 Final   Performed at [facility], [address] [place], [place] [address]   POC Amphetamine UR 04/10/2024 Positive (A)  NONE DETECTED (Cut Off Level 1000 ng/mL) Final   POC Secobarbital (BAR) 04/10/2024 None Detected  NONE DETECTED (Cut Off Level 300 ng/mL) Final   POC Buprenorphine (BUP) 04/10/2024 None Detected  NONE DETECTED (Cut Off Level 10 ng/mL) Final   POC Oxazepam (BZO) 04/10/2024 None Detected  NONE DETECTED (Cut Off Level 300 ng/mL) Final   POC Cocaine UR 04/10/2024 None Detected  NONE DETECTED (Cut Off Level 300 ng/mL) Final   POC Methamphetamine UR 04/10/2024 Positive  (A)  NONE DETECTED (Cut Off Level 1000 ng/mL) Final   POC Morphine 04/10/2024 None Detected  NONE DETECTED (Cut Off Level 300 ng/mL) Final   POC Methadone UR 04/10/2024 None Detected  NONE DETECTED (Cut Off Level 300 ng/mL) Final   POC Oxycodone UR 04/10/2024 None Detected  NONE DETECTED (Cut Off Level 100 ng/mL) Final   POC Marijuana UR 04/10/2024 Positive (A)  NONE DETECTED (Cut Off Level 50 ng/mL) Final  Admission on 02/21/2024, Discharged on 02/26/2024  Component Date Value Ref Range Status   WBC 02/22/2024 5.7  4.0 - 10.5 K/uL Final   RBC 02/22/2024 4.30  4.22 - 5.81 MIL/uL Final   Hemoglobin 02/22/2024 13.4  13.0 - 17.0 g/dL Final   HCT 40/09/2724 40.3  39.0 - 52.0 % Final   MCV 02/22/2024 93.7  80.0 - 100.0 fL Final   MCH 02/22/2024 31.2  26.0 - 34.0 pg Final   MCHC 02/22/2024 33.3  30.0 - 36.0 g/dL Final   RDW 36/64/4034 14.3  11.5 - 15.5 % Final   Platelets 02/22/2024 214  150 - 400 K/uL Final   nRBC 02/22/2024 0.0  0.0 - 0.2 % Final   Neutrophils Relative % 02/22/2024 42  % Final   Neutro Abs 02/22/2024 2.4  1.7 - 7.7 K/uL Final   Lymphocytes Relative 02/22/2024 50  % Final   Lymphs Abs 02/22/2024 2.8  0.7 - 4.0 K/uL Final   Monocytes Relative 02/22/2024 5  % Final   Monocytes Absolute 02/22/2024 0.3  0.1 - 1.0 K/uL Final   Eosinophils Relative 02/22/2024 3  %  Final   Eosinophils Absolute 02/22/2024 0.2  0.0 - 0.5 K/uL Final   Basophils Relative 02/22/2024 0  % Final   Basophils Absolute 02/22/2024 0.0  0.0 - 0.1 K/uL Final   Immature Granulocytes 02/22/2024 0  % Final   Abs Immature Granulocytes 02/22/2024 0.01  0.00 - 0.07 K/uL Final   Performed at [facility], [address], [place], [place] [address]   Sodium 02/22/2024 135  135 - 145 mmol/L Final   Potassium 02/22/2024 3.8  3.5 - 5.1 mmol/L Final   Chloride 02/22/2024 105  98 - 111 mmol/L Final   CO2 02/22/2024 26  22 - 32 mmol/L Final   Glucose, Bld 02/22/2024 89  70 - 99 mg/dL Final   Glucose reference  range applies only to samples taken after fasting for at least 8 hours.   BUN 02/22/2024 22 (H)  6 - 20 mg/dL Final   Creatinine, Ser 02/22/2024 1.13  0.61 - 1.24 mg/dL Final   Calcium 60/45/4098 8.5 (L)  8.9 - 10.3 mg/dL Final   Total Protein 11/91/4782 6.6  6.5 - 8.1 g/dL Final   Albumin 95/62/1308 3.2 (L)  3.5 - 5.0 g/dL Final   AST 65/78/4696 14 (L)  15 - 41 U/L Final   ALT 02/22/2024 15  0 - 44 U/L Final   Alkaline Phosphatase 02/22/2024 42  38 - 126 U/L Final   Total Bilirubin 02/22/2024 0.4  0.0 - 1.2 mg/dL Final   GFR, Estimated 02/22/2024 >60  >60 mL/min Final   Comment: (NOTE) Calculated using the CKD-EPI Creatinine Equation (2021)    Anion gap 02/22/2024 4 (L)  5 - 15 Final   Performed at [facility], [address], [place], [place] [address]   TSH 02/22/2024 2.786  0.350 - 4.500 uIU/mL Final   Comment: Performed by a 3rd Generation assay with a functional sensitivity of <=0.01 uIU/mL. Performed at [facility], [address], [place], [place] [address]    RPR Ser Ql 02/22/2024 NON REACTIVE  NON REACTIVE Final   Performed at [facility], [address] [place], [place] [address]   Hepatitis B Surface Ag 02/22/2024 NON REACTIVE  NON REACTIVE Final   HCV Ab 02/22/2024 NON REACTIVE  NON REACTIVE Final   Comment: (NOTE) Nonreactive HCV antibody screen is consistent with no HCV infections,  unless recent infection is suspected or other evidence exists to indicate HCV infection.     Hep A IgM 02/22/2024 NON REACTIVE  NON REACTIVE Final   Hep B C IgM 02/22/2024 NON REACTIVE  NON REACTIVE Final   Performed at [facility], [address], [place], [place] [address]  Admission on 02/19/2024, Discharged on 02/21/2024  Component Date Value Ref Range Status   Glucose-Capillary 02/21/2024 90  70 - 99 mg/dL Final   Glucose reference range applies only to samples taken after fasting for at least 8 hours.  Admission on 02/18/2024, Discharged on 02/19/2024   Component Date Value Ref Range Status   WBC 02/18/2024 6.6  4.0 - 10.5 K/uL Final   RBC 02/18/2024 4.78  4.22 - 5.81 MIL/uL Final   Hemoglobin 02/18/2024 15.0  13.0 - 17.0 g/dL Final   HCT 64/40/3474 45.0  39.0 - 52.0 % Final   MCV 02/18/2024 94.1  80.0 - 100.0 fL Final   MCH 02/18/2024 31.4  26.0 - 34.0 pg Final   MCHC 02/18/2024 33.3  30.0 - 36.0 g/dL Final   RDW  02/18/2024 14.5  11.5 - 15.5 % Final   Platelets 02/18/2024 238  150 - 400 K/uL Final   nRBC 02/18/2024 0.0  0.0 - 0.2 % Final   Neutrophils Relative % 02/18/2024 57  % Final   Neutro Abs 02/18/2024 3.8  1.7 - 7.7 K/uL Final   Lymphocytes Relative 02/18/2024 31  % Final   Lymphs Abs 02/18/2024 2.0  0.7 - 4.0 K/uL Final   Monocytes Relative 02/18/2024 9  % Final   Monocytes Absolute 02/18/2024 0.6  0.1 - 1.0 K/uL Final   Eosinophils Relative 02/18/2024 2  % Final   Eosinophils Absolute 02/18/2024 0.1  0.0 - 0.5 K/uL Final   Basophils Relative 02/18/2024 1  % Final   Basophils Absolute 02/18/2024 0.0  0.0 - 0.1 K/uL Final   Immature Granulocytes 02/18/2024 0  % Final   Abs Immature Granulocytes 02/18/2024 0.02  0.00 - 0.07 K/uL Final   Performed at [facility], [address] [place], [place] [address]   Sodium 02/18/2024 136  135 - 145 mmol/L Final   Potassium 02/18/2024 4.2  3.5 - 5.1 mmol/L Final   Chloride 02/18/2024 100  98 - 111 mmol/L Final   CO2 02/18/2024 28  22 - 32 mmol/L Final   Glucose, Bld 02/18/2024 141 (H)  70 - 99 mg/dL Final   Glucose reference range applies only to samples taken after fasting for at least 8 hours.   BUN 02/18/2024 15  6 - 20 mg/dL Final   Creatinine, Ser 02/18/2024 1.09  0.61 - 1.24 mg/dL Final   Calcium 24/40/1027 9.6  8.9 - 10.3 mg/dL Final   Total Protein 25/36/6440 7.9  6.5 - 8.1 g/dL Final   Albumin 34/74/2595 4.0  3.5 - 5.0 g/dL Final   AST 63/87/5643 19  15 - 41 U/L Final   ALT 02/18/2024 21  0 - 44 U/L Final   Alkaline Phosphatase 02/18/2024 54  38 - 126 U/L Final    Total Bilirubin 02/18/2024 0.6  0.0 - 1.2 mg/dL Final   GFR, Estimated 02/18/2024 >60  >60 mL/min Final   Comment: (NOTE) Calculated using the CKD-EPI Creatinine Equation (2021)    Anion gap 02/18/2024 8  5 - 15 Final   Performed at [facility], [address] [place], [place] [address]   Hgb A1c MFr Bld 02/18/2024 5.6  4.8 - 5.6 % Final   Comment: (NOTE) Pre diabetes:          5.7%-6.4%  Diabetes:              >6.4%  Glycemic control for   <7.0% adults with diabetes    Mean Plasma Glucose 02/18/2024 114.02  mg/dL Final   Performed at [facility], [address] [place], [place] [address]   Alcohol, Ethyl (B) 02/18/2024 <10  <10 mg/dL Final   Comment: (NOTE) Lowest detectable limit for serum alcohol is 10 mg/dL.  For medical purposes only. Performed at [facility], [address], [place], [place] [address]    Cholesterol 02/18/2024 154  0 - 200 mg/dL Final   Triglycerides 16/12/930 63  <150 mg/dL Final   HDL 35/57/3220 47  >40 mg/dL Final   Total CHOL/HDL Ratio 02/18/2024 3.3  RATIO Final   VLDL 02/18/2024 13  0 - 40 mg/dL Final   LDL Cholesterol 02/18/2024 94  0 - 99 mg/dL Final   Comment:        Total Cholesterol/HDL:CHD Risk Coronary  Heart Disease Risk Table                     Men   Women  1/2 Average Risk   3.4   3.3  Average Risk       5.0   4.4  2 X Average Risk   9.6   7.1  3 X Average Risk  23.4   11.0        Use the calculated Patient Ratio above and the CHD Risk Table to determine the patient's CHD Risk.        ATP III CLASSIFICATION (LDL):  <100     mg/dL   Optimal  308-657  mg/dL   Near or Above                    Optimal  130-159  mg/dL   Borderline  846-962  mg/dL   High  >952     mg/dL   Very High Performed at [facility], [address] [place], [place] [address]    TSH 02/18/2024 3.451  0.350 - 4.500 uIU/mL Final   Comment: Performed by a 3rd Generation assay with a functional sensitivity of <=0.01  uIU/mL. Performed at [facility], [address], [place], [place] [address]    POC Amphetamine UR 02/18/2024 None Detected  NONE DETECTED (Cut Off Level 1000 ng/mL) Final   POC Secobarbital (BAR) 02/18/2024 None Detected  NONE DETECTED (Cut Off Level 300 ng/mL) Final   POC Buprenorphine (BUP) 02/18/2024 None Detected  NONE DETECTED (Cut Off Level 10 ng/mL) Final   POC Oxazepam (BZO) 02/18/2024 None Detected  NONE DETECTED (Cut Off Level 300 ng/mL) Final   POC Cocaine UR 02/18/2024 None Detected  NONE DETECTED (Cut Off Level 300 ng/mL) Final   POC Methamphetamine UR 02/18/2024 Positive (A)  NONE DETECTED (Cut Off Level 1000 ng/mL) Final   POC Morphine 02/18/2024 None Detected  NONE DETECTED (Cut Off Level 300 ng/mL) Final   POC Methadone UR 02/18/2024 None Detected  NONE DETECTED (Cut Off Level 300 ng/mL) Final   POC Oxycodone UR 02/18/2024 None Detected  NONE DETECTED (Cut Off Level 100 ng/mL) Final   POC Marijuana UR 02/18/2024 None Detected  NONE DETECTED (Cut Off Level 50 ng/mL) Final  Admission on 01/16/2024, Discharged on 01/19/2024  Component Date Value Ref Range Status   Sodium 01/16/2024 136  135 - 145 mmol/L Final   Potassium 01/16/2024 4.2  3.5 - 5.1 mmol/L Final   Chloride 01/16/2024 101  98 - 111 mmol/L Final   CO2 01/16/2024 25  22 - 32 mmol/L Final   Glucose, Bld 01/16/2024 86  70 - 99 mg/dL Final   Glucose reference range applies only to samples taken after fasting for at least 8 hours.   BUN 01/16/2024 11  6 - 20 mg/dL Final   Creatinine, Ser 01/16/2024 1.07  0.61 - 1.24 mg/dL Final   Calcium 27/25/3664 9.0  8.9 - 10.3 mg/dL Final   Total Protein 40/34/7425 7.6  6.5 - 8.1 g/dL Final   Albumin 95/63/8756 3.7  3.5 - 5.0 g/dL Final   AST 43/32/9518 20  15 - 41 U/L Final   ALT 01/16/2024 18  0 - 44 U/L Final   Alkaline Phosphatase 01/16/2024 51  38 - 126 U/L Final   Total Bilirubin 01/16/2024 0.4  0.0 - 1.2 mg/dL Final   GFR, Estimated 01/16/2024 >60  >60 mL/min  Final   Comment: (  NOTE) Calculated using the CKD-EPI Creatinine Equation (2021)    Anion gap 01/16/2024 10  5 - 15 Final   Performed at [facility], [address], [place], [place] [address]   Lactic Acid, Venous 01/16/2024 1.2  0.5 - 1.9 mmol/L Final   WBC 01/16/2024 9.0  4.0 - 10.5 K/uL Final   RBC 01/16/2024 4.42  4.22 - 5.81 MIL/uL Final   Hemoglobin 01/16/2024 13.7  13.0 - 17.0 g/dL Final   HCT 60/45/4098 41.4  39.0 - 52.0 % Final   MCV 01/16/2024 93.7  80.0 - 100.0 fL Final   MCH 01/16/2024 31.0  26.0 - 34.0 pg Final   MCHC 01/16/2024 33.1  30.0 - 36.0 g/dL Final   RDW 11/91/4782 14.0  11.5 - 15.5 % Final   Platelets 01/16/2024 302  150 - 400 K/uL Final   nRBC 01/16/2024 0.0  0.0 - 0.2 % Final   Neutrophils Relative % 01/16/2024 59  % Final   Neutro Abs 01/16/2024 5.2  1.7 - 7.7 K/uL Final   Lymphocytes Relative 01/16/2024 31  % Final   Lymphs Abs 01/16/2024 2.8  0.7 - 4.0 K/uL Final   Monocytes Relative 01/16/2024 6  % Final   Monocytes Absolute 01/16/2024 0.5  0.1 - 1.0 K/uL Final   Eosinophils Relative 01/16/2024 4  % Final   Eosinophils Absolute 01/16/2024 0.3  0.0 - 0.5 K/uL Final   Basophils Relative 01/16/2024 0  % Final   Basophils Absolute 01/16/2024 0.0  0.0 - 0.1 K/uL Final   Immature Granulocytes 01/16/2024 0  % Final   Abs Immature Granulocytes 01/16/2024 0.04  0.00 - 0.07 K/uL Final   Performed at [facility], [address] [place], [place] [address]   Prothrombin Time 01/16/2024 13.4  11.4 - 15.2 seconds Final   INR 01/16/2024 1.0  0.8 - 1.2 Final   Comment: (NOTE) INR goal varies based on device and disease states. Performed at [facility], [address] [place], [place] [address]    Specimen Description 01/16/2024 BLOOD RIGHT ARM   Final   Special Requests 01/16/2024 BOTTLES DRAWN AEROBIC AND ANAEROBIC Blood Culture adequate volume   Final   Culture 01/16/2024    Final                   Value:NO GROWTH 5 DAYS Performed at [facility], [address], [place], [place] [address]    Report Status 01/16/2024 01/22/2024 FINAL   Final   Specimen Description 01/16/2024 BLOOD RIGHT HAND   Final   Special Requests 01/16/2024 BOTTLES DRAWN AEROBIC AND ANAEROBIC Blood Culture adequate volume   Final   Culture  Setup Time 01/16/2024    Final                   Value:GRAM POSITIVE COCCI IN CLUSTERS ANAEROBIC BOTTLE ONLY CRITICAL RESULT CALLED TO, READ BACK BY AND VERIFIED WITH: PHARMD K. HURTH [id number] @ 2121 FH    Culture 01/16/2024  (A)   Final                   Value:STAPHYLOCOCCUS EPIDERMIDIS THE SIGNIFICANCE OF ISOLATING THIS ORGANISM FROM A SINGLE SET OF BLOOD CULTURES WHEN MULTIPLE SETS ARE DRAWN IS UNCERTAIN. PLEASE NOTIFY THE MICROBIOLOGY DEPARTMENT WITHIN ONE WEEK IF SPECIATION AND SENSITIVITIES ARE REQUIRED. Performed at [facility], [address] [place], [place] [address]    Report Status 01/16/2024 01/19/2024 FINAL  Final   CD4 T Cell Abs 01/17/2024 919  400 - 1,790 /uL Final   CD4 % Helper T Cell 01/17/2024 36  33 - 65 % Final   Performed at [facility], [address], [place], [place] [address]   WBC 01/17/2024 7.9  4.0 - 10.5 K/uL Final   RBC 01/17/2024 3.98 (L)  4.22 - 5.81 MIL/uL Final   Hemoglobin 01/17/2024 12.4 (L)  13.0 - 17.0 g/dL Final   HCT 60/45/4098 37.7 (L)  39.0 - 52.0 % Final   MCV 01/17/2024 94.7  80.0 - 100.0 fL Final   MCH 01/17/2024 31.2  26.0 - 34.0 pg Final   MCHC 01/17/2024 32.9  30.0 - 36.0 g/dL Final   RDW 11/91/4782 13.8  11.5 - 15.5 % Final   Platelets 01/17/2024 264  150 - 400 K/uL Final   nRBC 01/17/2024 0.0  0.0 - 0.2 % Final   Performed at [facility], [address] [place], [place] [address]   Creatinine, Ser 01/17/2024 1.22  0.61 - 1.24 mg/dL Final   GFR, Estimated 01/17/2024 >60  >60 mL/min Final   Comment: (NOTE) Calculated using the CKD-EPI Creatinine Equation (2021) Performed at [facility], [address]  [place], [place] [address]    WBC 01/18/2024 4.7  4.0 - 10.5 K/uL Final   RBC 01/18/2024 4.45  4.22 - 5.81 MIL/uL Final   Hemoglobin 01/18/2024 13.7  13.0 - 17.0 g/dL Final   HCT 78/46/9629 41.0  39.0 - 52.0 % Final   MCV 01/18/2024 92.1  80.0 - 100.0 fL Final   MCH 01/18/2024 30.8  26.0 - 34.0 pg Final   MCHC 01/18/2024 33.4  30.0 - 36.0 g/dL Final   RDW 52/84/1324 13.6  11.5 - 15.5 % Final   Platelets 01/18/2024 272  150 - 400 K/uL Final   nRBC 01/18/2024 0.0  0.0 - 0.2 % Final   Neutrophils Relative % 01/18/2024 43  % Final   Neutro Abs 01/18/2024 2.0  1.7 - 7.7 K/uL Final   Lymphocytes Relative 01/18/2024 43  % Final   Lymphs Abs 01/18/2024 2.0  0.7 - 4.0 K/uL Final   Monocytes Relative 01/18/2024 6  % Final   Monocytes Absolute 01/18/2024 0.3  0.1 - 1.0 K/uL Final   Eosinophils Relative 01/18/2024 7  % Final   Eosinophils Absolute 01/18/2024 0.3  0.0 - 0.5 K/uL Final   Basophils Relative 01/18/2024 1  % Final   Basophils Absolute 01/18/2024 0.0  0.0 - 0.1 K/uL Final   Immature Granulocytes 01/18/2024 0  % Final   Abs Immature Granulocytes 01/18/2024 0.00  0.00 - 0.07 K/uL Final   Performed at [facility], [address] [place], [place] [address]   Sodium 01/18/2024 138  135 - 145 mmol/L Final   Potassium 01/18/2024 4.1  3.5 - 5.1 mmol/L Final   Chloride 01/18/2024 101  98 - 111 mmol/L Final   CO2 01/18/2024 25  22 - 32 mmol/L Final   Glucose, Bld 01/18/2024 89  70 - 99 mg/dL Final   Glucose reference range applies only to samples taken after fasting for at least 8 hours.   BUN 01/18/2024 13  6 - 20 mg/dL Final   Creatinine, Ser 01/18/2024 1.16  0.61 - 1.24 mg/dL Final   Calcium 72/53/6644 9.3  8.9 - 10.3 mg/dL Final   GFR, Estimated 01/18/2024 >60  >60 mL/min Final   Comment: (NOTE) Calculated using the CKD-EPI Creatinine  Equation (2021)    Anion gap 01/18/2024 12  5 - 15 Final   Performed at [facility], [address], [place], [place] [address]    Enterococcus faecalis 01/16/2024 NOT DETECTED  NOT DETECTED Final   Enterococcus Faecium 01/16/2024 NOT DETECTED  NOT DETECTED Final   Listeria monocytogenes 01/16/2024 NOT DETECTED  NOT DETECTED Final   Staphylococcus species 01/16/2024 DETECTED (A)  NOT DETECTED Final   Comment: CRITICAL RESULT CALLED TO, READ BACK BY AND VERIFIED WITH: PHARMD K. HURTH [id number] @2121  FH    Staphylococcus aureus (BCID) 01/16/2024 NOT DETECTED  NOT DETECTED Final   Staphylococcus epidermidis 01/16/2024 DETECTED (A)  NOT DETECTED Final   Comment: CRITICAL RESULT CALLED TO, READ BACK BY AND VERIFIED WITH: PHARMD K. HURTH [id number] @2121  FH    Staphylococcus lugdunensis 01/16/2024 NOT DETECTED  NOT DETECTED Final   Streptococcus species 01/16/2024 NOT DETECTED  NOT DETECTED Final   Streptococcus agalactiae 01/16/2024 NOT DETECTED  NOT DETECTED Final   Streptococcus pneumoniae 01/16/2024 NOT DETECTED  NOT DETECTED Final   Streptococcus pyogenes 01/16/2024 NOT DETECTED  NOT DETECTED Final   A.calcoaceticus-baumannii 01/16/2024 NOT DETECTED  NOT DETECTED Final   Bacteroides fragilis 01/16/2024 NOT DETECTED  NOT DETECTED Final   Enterobacterales 01/16/2024 NOT DETECTED  NOT DETECTED Final   Enterobacter cloacae complex 01/16/2024 NOT DETECTED  NOT DETECTED Final   Escherichia coli 01/16/2024 NOT DETECTED  NOT DETECTED Final   Klebsiella aerogenes 01/16/2024 NOT DETECTED  NOT DETECTED Final   Klebsiella oxytoca 01/16/2024 NOT DETECTED  NOT DETECTED Final   Klebsiella pneumoniae 01/16/2024 NOT DETECTED  NOT DETECTED Final   Proteus species 01/16/2024 NOT DETECTED  NOT DETECTED Final   Salmonella species 01/16/2024 NOT DETECTED  NOT DETECTED Final   Serratia marcescens 01/16/2024 NOT DETECTED  NOT DETECTED Final   Haemophilus influenzae 01/16/2024 NOT DETECTED  NOT DETECTED Final   Neisseria meningitidis 01/16/2024 NOT DETECTED  NOT DETECTED Final   Pseudomonas aeruginosa 01/16/2024 NOT DETECTED  NOT DETECTED Final    Stenotrophomonas maltophilia 01/16/2024 NOT DETECTED  NOT DETECTED Final   Candida albicans 01/16/2024 NOT DETECTED  NOT DETECTED Final   Candida auris 01/16/2024 NOT DETECTED  NOT DETECTED Final   Candida glabrata 01/16/2024 NOT DETECTED  NOT DETECTED Final   Candida krusei 01/16/2024 NOT DETECTED  NOT DETECTED Final   Candida parapsilosis 01/16/2024 NOT DETECTED  NOT DETECTED Final   Candida tropicalis 01/16/2024 NOT DETECTED  NOT DETECTED Final   Cryptococcus neoformans/gattii 01/16/2024 NOT DETECTED  NOT DETECTED Final   Methicillin resistance mecA/C 01/16/2024 NOT DETECTED  NOT DETECTED Final   Performed at [facility], [address] [place], [place] [address]   Total lymphocyte count 01/18/2024 2,126  1,000 - 4,000 /uL Final   CD4% 01/18/2024 39.16  33 - 65 % Final   CD4 absolute 01/18/2024 833  400 - 1,790 /uL Final   CD8tox 01/18/2024 34.88  12 - 40 % Final   CD8 T Cell Abs 01/18/2024 742  190 - 1,000 /uL Final   Ratio 01/18/2024 1.12  1.0 - 3.0 Final   Performed at [facility], [address] [place], [place] [address]  Admission on 11/27/2023, Discharged on 12/01/2023  Component Date Value Ref Range Status   HIV 1 RNA Quant 11/29/2023 12,600  copies/mL Corrected   Comment: (NOTE) The reportable range for this assay is 20 to 10,000,000 copies HIV-1 RNA/mL.    LOG10 HIV-1 RNA 11/29/2023 4.100  log10copy/mL Final  Comment: (NOTE) Performed At: [facility] [address] [place], [place] [id number] Pearlean Botts MD GM:[id number]    Neisseria Gonorrhea 11/30/2023 Negative   Final   Chlamydia 11/30/2023 Negative   Final   Trichomonas 11/30/2023 Negative   Final   Comment 11/30/2023 Normal Reference Ranger Chlamydia - Negative   Final   Comment 11/30/2023 Normal Reference Range Neisseria Gonorrhea - Negative   Final   Comment 11/30/2023 Normal Reference Range Trichomonas - Negative   Final  Admission on 11/26/2023, Discharged on  11/27/2023  Component Date Value Ref Range Status   WBC 11/26/2023 6.5  4.0 - 10.5 K/uL Final   RBC 11/26/2023 4.17 (L)  4.22 - 5.81 MIL/uL Final   Hemoglobin 11/26/2023 12.7 (L)  13.0 - 17.0 g/dL Final   HCT 44/02/4741 38.7 (L)  39.0 - 52.0 % Final   MCV 11/26/2023 92.8  80.0 - 100.0 fL Final   MCH 11/26/2023 30.5  26.0 - 34.0 pg Final   MCHC 11/26/2023 32.8  30.0 - 36.0 g/dL Final   RDW 59/56/3875 14.4  11.5 - 15.5 % Final   Platelets 11/26/2023 229  150 - 400 K/uL Final   nRBC 11/26/2023 0.0  0.0 - 0.2 % Final   Neutrophils Relative % 11/26/2023 55  % Final   Neutro Abs 11/26/2023 3.6  1.7 - 7.7 K/uL Final   Lymphocytes Relative 11/26/2023 32  % Final   Lymphs Abs 11/26/2023 2.1  0.7 - 4.0 K/uL Final   Monocytes Relative 11/26/2023 7  % Final   Monocytes Absolute 11/26/2023 0.4  0.1 - 1.0 K/uL Final   Eosinophils Relative 11/26/2023 5  % Final   Eosinophils Absolute 11/26/2023 0.3  0.0 - 0.5 K/uL Final   Basophils Relative 11/26/2023 1  % Final   Basophils Absolute 11/26/2023 0.0  0.0 - 0.1 K/uL Final   Immature Granulocytes 11/26/2023 0  % Final   Abs Immature Granulocytes 11/26/2023 0.01  0.00 - 0.07 K/uL Final   Performed at [facility], [address], [place], [place] [address]   Sodium 11/26/2023 140  135 - 145 mmol/L Final   Potassium 11/26/2023 4.2  3.5 - 5.1 mmol/L Final   Chloride 11/26/2023 111  98 - 111 mmol/L Final   CO2 11/26/2023 25  22 - 32 mmol/L Final   Glucose, Bld 11/26/2023 86  70 - 99 mg/dL Final   Glucose reference range applies only to samples taken after fasting for at least 8 hours.   BUN 11/26/2023 11  6 - 20 mg/dL Final   Creatinine, Ser 11/26/2023 0.93  0.61 - 1.24 mg/dL Final   Calcium 95/18/8416 9.0  8.9 - 10.3 mg/dL Final   Total Protein 60/63/0160 6.5  6.5 - 8.1 g/dL Final   Albumin 10/93/2355 3.6  3.5 - 5.0 g/dL Final   AST 73/22/0254 36  15 - 41 U/L Final   ALT 11/26/2023 25  0 - 44 U/L Final   Alkaline Phosphatase 11/26/2023 51  38 -  126 U/L Final   Total Bilirubin 11/26/2023 0.5  <1.2 mg/dL Final   GFR, Estimated 11/26/2023 >60  >60 mL/min Final   Comment: (NOTE) Calculated using the CKD-EPI Creatinine Equation (2021)    Anion gap 11/26/2023 4 (L)  5 - 15 Final   Performed at [facility], [address], [place], [place] [address]   Alcohol, Ethyl (B) 11/26/2023 <10  <10 mg/dL Final   Comment: (NOTE) Lowest detectable limit for serum alcohol is  10 mg/dL.  For medical purposes only. Performed at [facility], [address] [place], [place] [address]    POC Amphetamine UR 11/26/2023 None Detected  NONE DETECTED (Cut Off Level 1000 ng/mL) Final   POC Secobarbital (BAR) 11/26/2023 None Detected  NONE DETECTED (Cut Off Level 300 ng/mL) Final   POC Buprenorphine (BUP) 11/26/2023 None Detected  NONE DETECTED (Cut Off Level 10 ng/mL) Final   POC Oxazepam (BZO) 11/26/2023 None Detected  NONE DETECTED (Cut Off Level 300 ng/mL) Final   POC Cocaine UR 11/26/2023 None Detected  NONE DETECTED (Cut Off Level 300 ng/mL) Final   POC Methamphetamine UR 11/26/2023 None Detected  NONE DETECTED (Cut Off Level 1000 ng/mL) Final   POC Morphine 11/26/2023 None Detected  NONE DETECTED (Cut Off Level 300 ng/mL) Final   POC Methadone UR 11/26/2023 None Detected  NONE DETECTED (Cut Off Level 300 ng/mL) Final   POC Oxycodone UR 11/26/2023 None Detected  NONE DETECTED (Cut Off Level 100 ng/mL) Final   POC Marijuana UR 11/26/2023 None Detected  NONE DETECTED (Cut Off Level 50 ng/mL) Final   Cholesterol 11/26/2023 148  0 - 200 mg/dL Final   Triglycerides 65/78/4696 65  <150 mg/dL Final   HDL 29/52/8413 43  >40 mg/dL Final   Total CHOL/HDL Ratio 11/26/2023 3.4  RATIO Final   VLDL 11/26/2023 13  0 - 40 mg/dL Final   LDL Cholesterol 11/26/2023 92  0 - 99 mg/dL Final   Comment:        Total Cholesterol/HDL:CHD Risk Coronary Heart Disease Risk Table                     Men   Women  1/2 Average Risk   3.4   3.3  Average Risk        5.0   4.4  2 X Average Risk   9.6   7.1  3 X Average Risk  23.4   11.0        Use the calculated Patient Ratio above and the CHD Risk Table to determine the patient's CHD Risk.        ATP III CLASSIFICATION (LDL):  <100     mg/dL   Optimal  244-010  mg/dL   Near or Above                    Optimal  130-159  mg/dL   Borderline  272-536  mg/dL   High  >644     mg/dL   Very High Performed at [facility], [address], [place], [place] [address]   Admission on 10/23/2023, Discharged on 10/23/2023  Component Date Value Ref Range Status   Sodium 10/23/2023 134 (L)  135 - 145 mmol/L Final   Potassium 10/23/2023 4.4  3.5 - 5.1 mmol/L Final   Chloride 10/23/2023 106  98 - 111 mmol/L Final   CO2 10/23/2023 18 (L)  22 - 32 mmol/L Final   Glucose, Bld 10/23/2023 87  70 - 99 mg/dL Final   Glucose reference range applies only to samples taken after fasting for at least 8 hours.   BUN 10/23/2023 10  6 - 20 mg/dL Final   Creatinine, Ser 10/23/2023 1.06  0.61 - 1.24 mg/dL Final   Calcium 25/95/6387 9.1  8.9 - 10.3 mg/dL Final   Total Protein 56/43/3295 7.6  6.5 - 8.1 g/dL Final   Albumin 18/84/1660 3.9  3.5 - 5.0 g/dL Final  AST 10/23/2023 29  15 - 41 U/L Final   ALT 10/23/2023 16  0 - 44 U/L Final   Alkaline Phosphatase 10/23/2023 51  38 - 126 U/L Final   Total Bilirubin 10/23/2023 0.6  <1.2 mg/dL Final   GFR, Estimated 10/23/2023 >60  >60 mL/min Final   Comment: (NOTE) Calculated using the CKD-EPI Creatinine Equation (2021)    Anion gap 10/23/2023 10  5 - 15 Final   Performed at [facility], [address], [place], [place] [address]   Alcohol, Ethyl (B) 10/23/2023 <10  <10 mg/dL Final   Comment: (NOTE) Lowest detectable limit for serum alcohol is 10 mg/dL.  For medical purposes only. Performed at [facility], [address], [place], [place] [address]    Opiates 10/23/2023 NONE DETECTED  NONE DETECTED Final   Cocaine 10/23/2023 NONE DETECTED  NONE DETECTED  Final   Benzodiazepines 10/23/2023 NONE DETECTED  NONE DETECTED Final   Amphetamines 10/23/2023 NONE DETECTED  NONE DETECTED Final   Tetrahydrocannabinol 10/23/2023 NONE DETECTED  NONE DETECTED Final   Barbiturates 10/23/2023 NONE DETECTED  NONE DETECTED Final   Comment: (NOTE) DRUG SCREEN FOR MEDICAL PURPOSES ONLY.  IF CONFIRMATION IS NEEDED FOR ANY PURPOSE, NOTIFY LAB WITHIN 5 DAYS.  LOWEST DETECTABLE LIMITS FOR URINE DRUG SCREEN Drug Class                     Cutoff (ng/mL) Amphetamine and metabolites    1000 Barbiturate and metabolites    200 Benzodiazepine                 200 Opiates and metabolites        300 Cocaine and metabolites        300 THC                            50 Performed at [facility], [address] [place], [place] [address]    WBC 10/23/2023 4.8  4.0 - 10.5 K/uL Final   RBC 10/23/2023 4.96  4.22 - 5.81 MIL/uL Final   Hemoglobin 10/23/2023 15.2  13.0 - 17.0 g/dL Final   HCT 08/65/7846 46.2  39.0 - 52.0 % Final   MCV 10/23/2023 93.1  80.0 - 100.0 fL Final   MCH 10/23/2023 30.6  26.0 - 34.0 pg Final   MCHC 10/23/2023 32.9  30.0 - 36.0 g/dL Final   RDW 96/29/5284 14.4  11.5 - 15.5 % Final   Platelets 10/23/2023 225  150 - 400 K/uL Final   nRBC 10/23/2023 0.0  0.0 - 0.2 % Final   Neutrophils Relative % 10/23/2023 40  % Final   Neutro Abs 10/23/2023 1.9  1.7 - 7.7 K/uL Final   Lymphocytes Relative 10/23/2023 44  % Final   Lymphs Abs 10/23/2023 2.2  0.7 - 4.0 K/uL Final   Monocytes Relative 10/23/2023 9  % Final   Monocytes Absolute 10/23/2023 0.4  0.1 - 1.0 K/uL Final   Eosinophils Relative 10/23/2023 6  % Final   Eosinophils Absolute 10/23/2023 0.3  0.0 - 0.5 K/uL Final   Basophils Relative 10/23/2023 1  % Final   Basophils Absolute 10/23/2023 0.0  0.0 - 0.1 K/uL Final   Immature Granulocytes 10/23/2023 0  % Final   Abs Immature Granulocytes 10/23/2023 0.01  0.00 - 0.07 K/uL Final   Performed at [facility], [address]  [place], [place] [address]  Total lymphocyte count 10/23/2023 2,194  1,000 - 4,000 /uL Final   CD4% 10/23/2023 26.20 (L)  33 - 65 % Final   CD4 absolute 10/23/2023 575  400 - 1,790 /uL Final   CD8tox 10/23/2023 34.30  12 - 40 % Final   CD8 T Cell Abs 10/23/2023 753  190 - 1,000 /uL Final   Ratio 10/23/2023 0.76 (L)  1.0 - 3.0 Final   Performed at [facility], [address], [place], [place] [address]   Blood Alcohol level:  Lab Results  Component Value Date   ETH <10 02/18/2024   ETH <10 11/26/2023    Metabolic Disorder Labs: Lab Results  Component Value Date   HGBA1C 5.6 02/18/2024   MPG 114.02 02/18/2024   MPG 122.63 06/13/2023   Lab Results  Component Value Date   PROLACTIN 19.4 06/13/2023   Lab Results  Component Value Date   CHOL 154 02/18/2024   TRIG 63 02/18/2024   HDL 47 02/18/2024   CHOLHDL 3.3 02/18/2024   VLDL 13 02/18/2024   LDLCALC 94 02/18/2024   LDLCALC 92 11/26/2023   Therapeutic Lab Levels: No results found for: "LITHIUM" No results found for: "VALPROATE" No results found for: "CBMZ"  Physical Findings   AIMS    Flowsheet Row [facility]  AIMS Total Score 0      AUDIT    Flowsheet Row Ad[facility] ([facility]  Alcohol Use Disorder Identification Test Final Score (AUDIT) 0      PHQ2-9    Flowsheet Row ED from 02/21/2024 in [facility] ED from 02/19/2024 in [facility] Office Visit from 02/04/2022 in [facility] [place] Office Visit from 04/16/2021 in [facility]  PHQ-2 Total Score 0 6 0 0  PHQ-9 Total Score 0 26 -- --      Flowsheet Row ED from 04/10/2024 in [facility] ED from 02/21/2024  in [facility] Emergency Department at [facility] ED from 02/19/2024 in [facility]  C-SSRS RISK CATEGORY High Risk No Risk High Risk       Musculoskeletal  Strength & Muscle Tone: within normal limits Gait & Station: normal Patient leans: N/A  Psychiatric Specialty Exam  Presentation  General Appearance:  Casual  Eye Contact: Fair  Speech: Clear and Coherent  Speech Volume: Normal  Handedness: Right  Mood and Affect  Mood: Anxious; Depressed  Affect: Congruent  Thought Process  Thought Processes: Coherent; Linear  Descriptions of Associations:Intact  Orientation:Full (Time, Place and Person)  Thought Content:Logical  Diagnosis of Schizophrenia or Schizoaffective disorder in past: No  Duration of Psychotic Symptoms: Greater than six months   Hallucinations:Hallucinations: None  Ideas of Reference:None  Suicidal Thoughts:Suicidal Thoughts: No SI Active Intent and/or Plan: -- (Denies)  Homicidal Thoughts:Homicidal Thoughts: No  Sensorium  Memory: Immediate Fair  Judgment: Intact  Insight: Shallow  Executive Functions  Concentration: Good  Attention Span: Good  Recall: Good  Fund of Knowledge: Fair  Language: Good  Psychomotor Activity  Psychomotor Activity: Psychomotor Activity: Normal  Assets  Assets: Communication Skills; Physical Health; Resilience  Sleep  Sleep: Sleep: Good Number of Hours of Sleep: 9  Nutritional Assessment (For OBS and FBC admissions only)  Has the patient had a weight loss or gain of 10 pounds or more in the last 3 months?: No Has the patient had a decrease in food intake/or appetite?: No Does the patient have dental problems?: No Does the patient have eating habits or behaviors that may be indicators of an eating disorder including binging or inducing vomiting?: No Has the patient recently lost weight without trying?: 0 Has the patient been eating poorly  because of a decreased appetite?: 0 Malnutrition Screening Tool Score: 0    Physical Exam  Physical Exam Vitals and nursing note reviewed.  Constitutional:      Appearance: Normal appearance.  HENT:     Head: Normocephalic.     Right Ear: External ear normal.     Left Ear: External ear normal.     Nose: Nose normal.     Mouth/Throat:     Mouth: Mucous membranes are moist.     Pharynx: Oropharynx is clear.  Eyes:     Extraocular Movements: Extraocular movements intact.  Cardiovascular:     Rate and Rhythm: Normal rate.     Pulses: Normal pulses.  Pulmonary:     Effort: Pulmonary effort is normal.  Abdominal:     Comments: Deferred  Genitourinary:    Comments: Deferred Musculoskeletal:        General: Normal range of motion.     Cervical back: Normal range of motion.  Skin:    General: Skin is warm.  Neurological:     General: No focal deficit present.     Mental Status: He is alert and oriented to person, place, and time.  Psychiatric:        Mood and Affect: Mood normal.        Behavior: Behavior normal.        Thought Content: Thought content normal.    Review of Systems  Constitutional:  Negative for chills and fever.  HENT:  Negative for sore throat.   Eyes:  Negative for blurred vision.  Respiratory:  Negative for cough, sputum production, shortness of breath and wheezing.   Cardiovascular:  Negative for chest pain and palpitations.  Gastrointestinal:  Negative for heartburn and nausea.  Genitourinary:  Negative for dysuria, frequency and urgency.  Musculoskeletal:  Negative for myalgias.  Skin:  Negative for itching and rash.  Neurological:  Negative for dizziness and headaches.  Endo/Heme/Allergies:        See allergy listing  Psychiatric/Behavioral:  Positive for depression and substance abuse. Negative for hallucinations and suicidal ideas. The patient is nervous/anxious. The patient does not have insomnia.    Blood pressure 117/74, pulse 90,  temperature 98.3 F (36.8 C), temperature source Oral, resp. rate 16, SpO2 99%. There is no height or weight on file to calculate BMI.  Treatment Plan Summary: Daily contact with patient to assess and evaluate symptoms and progress in treatment and Medication management  Plan: Medications:  See [facility] for scheduled medications.  Laurence Pons, FNP 04/11/2024 11:39 AM

## 2024-04-11 NOTE — ED Notes (Signed)
 Patient A&Ox4. Has been calm, cooperative, and appropriate with peers. He denies intent to harm self/others. Denies A/VH. Patient denies any physical pain or discomfort. No acute distress observed. Routine safety checks conducted according to facility protocol. Patient agreed to notify staff should thoughts of harm toward self or others arise. We will continue to monitor for safety.

## 2024-04-11 NOTE — ED Notes (Signed)
 Pt sleeping at present, no distress noted.  Monitoring for safety.

## 2024-04-11 NOTE — ED Notes (Signed)
 Pt denies SI/HI/AVH. No noted distress. Will continue to monitor for safety

## 2024-04-11 NOTE — ED Notes (Signed)
 Pt A&O x 4, presents with suicidal ideations, plan to overdose on medication.  Denies HI or AVH.  Pt admits to using meth.

## 2024-04-12 MED ORDER — ARIPIPRAZOLE 10 MG PO TABS: 10.0000 mg | ORAL_TABLET | Freq: Every day | ORAL | 0 refills | Status: DC

## 2024-04-12 MED ORDER — BUPROPION HCL ER (XL) 150 MG PO TB24: 150.0000 mg | ORAL_TABLET | Freq: Every day | ORAL | 0 refills | Status: DC

## 2024-04-12 NOTE — ED Notes (Signed)
 Stable. A&O x 4.  Discharging to Home.    Denied current SI plan and intent Denies HI and A/V hallucinations.   All belongings returned to PT.  Reviewed discharge instructions, follow up and medications  Pt verbalized understanding

## 2024-04-12 NOTE — ED Notes (Signed)
 Pt was provided breakfast.

## 2024-04-12 NOTE — ED Provider Notes (Signed)
 FBC/OBS ASAP Discharge Summary  Date and Time: 04/12/2024 9:24 AM  Name: Kenneth Welch.  MRN:  [id number]   Discharge Diagnoses:  Final diagnoses:  Homelessness  Stress and adjustment reaction    Subjective: Kenneth Welch. 38 y.o., male patient presented to [facility] with complaints of suicidal ideations in the context of homelessness and being released from jail.  Patient later discloses that he lied about being suicidal in order to stay here for couple days.  Past psychiatric history of schizoaffective disorder and methamphetamine use.  His urine drug screen was positive for amphetamines, methamphetamine and THC.  Patient is not currently following up with outpatient psychiatry or therapy. Kenneth Welch., is seen face to face by this provider, consulted with Dr. Docia Freeman; and chart reviewed on 04/12/24.   On evaluation Kenneth Welch. Reports that he did not sleep well and has had a good appetite.  He reports that his mood has improved since being provided a list of resources for shelters and residential substance abuse treatment.  He is able to contract for safety and reports feeling ready for discharge with a plan to follow-up with [facility] behavioral health open access on Monday.  He denies suicidal ideations, homicidal ideations and symptoms of psychosis.  Patient was restarted on Abilify  10 mg daily and Wellbutrin  150 mg daily.  He denies having any side effects or concerns about the medications and is provided with education on medications patient reports that he has in appointment with infectious disease in June that was set up after being released from jail.  Patient is provided with a list of shelters as well as residential substance abuse treatment programs.  He is also provided with a bus passes for transportation upon discharge.  During evaluation Kenneth Welch. is lying in bed, in no acute distress.  He is alert & oriented x 4, calm,  cooperative and attentive for this assessment.  His mood is euthymic with congruent affect.  He has normal speech, and behavior.  Objectively there is no evidence of psychosis/mania or delusional thinking. Pt does not appear to be responding to internal or external stimuli.  Patient is able to converse coherently, goal directed thoughts, no distractibility, or pre-occupation.  He denies current suicidal/self-harm/homicidal ideation, psychosis, and paranoia.  Patient answered assessment questions appropriately.    Stay Summary: 04/12/2024  [place], NP         Trula Gable. Boone Buzzard. Is a 38 year old male with psychiatric history of IVDU, Methamphetamine use disorder, long term current use of antipsychotic medication, schizophrenia and medical history of HIV, who presented voluntarily as a walk in to [facility], accompanied by a crisis counselor, with initial complaint of SI with a plan to OD on his medications, which he later reports as false and mainly seeking help with housing due to homelessness. He reports being frustrated as he is unable to get a job due to his criminal record and also unable to find a place to sleep at night after he was released from [facility]. He is on SSI. He reports being able to stay overnight at the [facility] in the past, but that facility is now only open during the daytime and he has nowhere else to go. Patient is seen and examined face-to-face sitting up in his bed.  Chart reviewed and findings shared with the treatment team and consult with attending psychiatrist Dr. Docia Freeman   Objective: Patient presents alert, oriented to person, place,  time, and situation.  Speech is clear, coherent but low in volume.  Patient reports that his mood is to find a shelter to return to, due to losing his car that he used to live in.  Active listening and emotional support provided for ongoing stressors.  Reports anxiety of #7/10, depression of #9/10, with 10 being high severity.  Encouraged patient to receive as  needed for anxiety from nursing staff, and to be compliant with his antidepressive medicine.  Able to respond to the assessment question as minimally.  Obviously not responding to internal or external stimuli at this time.  He denies SI, HI, or AVH.  Vital signs without critical values.  Patient continues to require monitoring for safety in the observational unit until appropriate placement is found.  Total Time spent with patient: 20 minutes  Past Psychiatric History: Schizoaffective disorder and Methamphetamine use disorder Past Medical History: HIV and asthma  Family History: None reported Family Psychiatric History: None reported Social History: Patient recently released from jail and is currently homeless and unemployed.  Patient endorses THC and methamphetamine use. Tobacco Cessation:  N/A, patient does not currently use tobacco products  Current Medications:  Current Facility-Administered Medications  Medication Dose Route Frequency Provider Last Rate Last Admin   acetaminophen  (TYLENOL ) tablet 650 mg  650 mg Oral Q6H PRN Onuoha, Chinwendu V, NP       ARIPiprazole  (ABILIFY ) tablet 10 mg  10 mg Oral Daily Onuoha, Chinwendu V, NP   10 mg at 04/12/24 0835   bictegravir-emtricitabine -tenofovir  AF (BIKTARVY ) 50-200-25 MG per tablet 1 tablet  1 tablet Oral Daily Onuoha, Chinwendu V, NP   1 tablet at 04/12/24 1027   buPROPion  (WELLBUTRIN  XL) 24 hr tablet 150 mg  150 mg Oral Daily Onuoha, Chinwendu V, NP   150 mg at 04/12/24 0835   haloperidol  (HALDOL ) tablet 5 mg  5 mg Oral TID PRN Onuoha, Chinwendu V, NP       And   diphenhydrAMINE  (BENADRYL ) capsule 50 mg  50 mg Oral TID PRN Onuoha, Chinwendu V, NP       haloperidol  lactate (HALDOL ) injection 5 mg  5 mg Intramuscular TID PRN Onuoha, Chinwendu V, NP       And   diphenhydrAMINE  (BENADRYL ) injection 50 mg  50 mg Intramuscular TID PRN Onuoha, Chinwendu V, NP       And   LORazepam  (ATIVAN ) injection 2 mg  2 mg Intramuscular TID PRN Onuoha,  Chinwendu V, NP       haloperidol  lactate (HALDOL ) injection 10 mg  10 mg Intramuscular TID PRN Onuoha, Chinwendu V, NP       And   diphenhydrAMINE  (BENADRYL ) injection 50 mg  50 mg Intramuscular TID PRN Onuoha, Chinwendu V, NP       And   LORazepam  (ATIVAN ) injection 2 mg  2 mg Intramuscular TID PRN Onuoha, Chinwendu V, NP       hydrOXYzine  (ATARAX ) tablet 25 mg  25 mg Oral TID PRN Onuoha, Chinwendu V, NP       traZODone  (DESYREL ) tablet 50 mg  50 mg Oral QHS PRN Onuoha, Chinwendu V, NP       Current Outpatient Medications  Medication Sig Dispense Refill   ARIPiprazole  (ABILIFY ) 10 MG tablet Take 1 tablet (10 mg total) by mouth daily. 30 tablet 0   bictegravir-emtricitabine -tenofovir  AF (BIKTARVY ) 50-200-25 MG TABS tablet Take 1 tablet by mouth daily. 30 tablet 0   buPROPion  (WELLBUTRIN  XL) 150 MG 24 hr tablet Take 1 tablet (  150 mg total) by mouth daily. 30 tablet 0   hydrOXYzine  (ATARAX ) 25 MG tablet Take 1 tablet (25 mg total) by mouth 3 (three) times daily as needed for anxiety. 30 tablet 0   Naphazoline HCl (CLEAR EYES OP) Place 2 drops into both eyes 4 (four) times daily as needed (For eye irritation).     traZODone  (DESYREL ) 50 MG tablet Take 1 tablet (50 mg total) by mouth at bedtime as needed for sleep. 30 tablet 0   fluticasone  (FLONASE ) 50 MCG/ACT nasal spray Place 2 sprays into both nostrils daily as needed for allergies or rhinitis. (Patient not taking: Reported on 04/11/2024) 11.1 mL 0   loratadine  (CLARITIN ) 10 MG tablet Take 1 tablet (10 mg total) by mouth daily as needed for allergies or rhinitis. (Patient not taking: Reported on 04/11/2024) 30 tablet 0    PTA Medications:  Facility Ordered Medications  Medication   acetaminophen  (TYLENOL ) tablet 650 mg   haloperidol  (HALDOL ) tablet 5 mg   And   diphenhydrAMINE  (BENADRYL ) capsule 50 mg   haloperidol  lactate (HALDOL ) injection 5 mg   And   diphenhydrAMINE  (BENADRYL ) injection 50 mg   And   LORazepam  (ATIVAN ) injection 2 mg    haloperidol  lactate (HALDOL ) injection 10 mg   And   diphenhydrAMINE  (BENADRYL ) injection 50 mg   And   LORazepam  (ATIVAN ) injection 2 mg   hydrOXYzine  (ATARAX ) tablet 25 mg   traZODone  (DESYREL ) tablet 50 mg   ARIPiprazole  (ABILIFY ) tablet 10 mg   bictegravir-emtricitabine -tenofovir  AF (BIKTARVY ) 50-200-25 MG per tablet 1 tablet   buPROPion  (WELLBUTRIN  XL) 24 hr tablet 150 mg   PTA Medications  Medication Sig   ARIPiprazole  (ABILIFY ) 10 MG tablet Take 1 tablet (10 mg total) by mouth daily.   bictegravir-emtricitabine -tenofovir  AF (BIKTARVY ) 50-200-25 MG TABS tablet Take 1 tablet by mouth daily.   buPROPion  (WELLBUTRIN  XL) 150 MG 24 hr tablet Take 1 tablet (150 mg total) by mouth daily.   hydrOXYzine  (ATARAX ) 25 MG tablet Take 1 tablet (25 mg total) by mouth 3 (three) times daily as needed for anxiety.   traZODone  (DESYREL ) 50 MG tablet Take 1 tablet (50 mg total) by mouth at bedtime as needed for sleep.   Naphazoline HCl (CLEAR EYES OP) Place 2 drops into both eyes 4 (four) times daily as needed (For eye irritation).   fluticasone  (FLONASE ) 50 MCG/ACT nasal spray Place 2 sprays into both nostrils daily as needed for allergies or rhinitis. (Patient not taking: Reported on 04/11/2024)   loratadine  (CLARITIN ) 10 MG tablet Take 1 tablet (10 mg total) by mouth daily as needed for allergies or rhinitis. (Patient not taking: Reported on 04/11/2024)       02/25/2024    5:41 PM 02/24/2024    1:20 PM 02/22/2024    3:30 PM  Depression screen PHQ 2/9  Decreased Interest 0 0 2  Down, Depressed, Hopeless 0 0 2  PHQ - 2 Score 0 0 4  Altered sleeping 0 0 2  Tired, decreased energy 0 0 2  Change in appetite 0 0 1  Feeling bad or failure about yourself  0 0 2  Trouble concentrating 0 0 2  Moving slowly or fidgety/restless 0 0 2  Suicidal thoughts 0 0 2  PHQ-9 Score 0 0 17  Difficult doing work/chores Not difficult at all Not difficult at all Very difficult    Flowsheet Row ED from 04/10/2024 in  [facility] ED from 02/21/2024 in [facility]  at [facility] ED from 02/19/2024 in [facility]  C-SSRS RISK CATEGORY High Risk No Risk High Risk       Musculoskeletal  Strength & Muscle Tone: within normal limits Gait & Station: normal Patient leans: N/A  Psychiatric Specialty Exam  Presentation  General Appearance:  Appropriate for Environment  Eye Contact: Good  Speech: Clear and Coherent  Speech Volume: Normal  Handedness: Right   Mood and Affect  Mood: Dysphoric  Affect: Appropriate; Congruent   Thought Process  Thought Processes: Coherent; Linear  Descriptions of Associations:Intact  Orientation:Full (Time, Place and Person)  Thought Content:WDL; Logical  Diagnosis of Schizophrenia or Schizoaffective disorder in past: No  Duration of Psychotic Symptoms: Greater than six months   Hallucinations:Hallucinations: None  Ideas of Reference:None  Suicidal Thoughts:Suicidal Thoughts: No SI Active Intent and/or Plan: -- (Denies)  Homicidal Thoughts:Homicidal Thoughts: No   Sensorium  Memory: Immediate Good; Recent Fair  Judgment: Fair  Insight: Fair   Art therapist  Concentration: Good  Attention Span: Good  Recall: Fair  Fund of Knowledge: Fair  Language: Good   Psychomotor Activity  Psychomotor Activity: Psychomotor Activity: Normal   Assets  Assets: Desire for Improvement; Physical Health; Communication Skills; Resilience   Sleep  Sleep: Sleep: Good Number of Hours of Sleep: 8   Nutritional Assessment (For OBS and FBC admissions only) Has the patient had a weight loss or gain of 10 pounds or more in the last 3 months?: No Has the patient had a decrease in food intake/or appetite?: No Does the patient have dental problems?: No Does the patient have eating habits or behaviors that may be indicators of an eating disorder  including binging or inducing vomiting?: No Has the patient recently lost weight without trying?: 0 Has the patient been eating poorly because of a decreased appetite?: 0 Malnutrition Screening Tool Score: 0    Physical Exam  Physical Exam Vitals and nursing note reviewed.  Constitutional:      Appearance: Normal appearance.  HENT:     Head: Normocephalic.     Nose: Nose normal.  Eyes:     Extraocular Movements: Extraocular movements intact.  Cardiovascular:     Rate and Rhythm: Normal rate.  Pulmonary:     Effort: Pulmonary effort is normal.  Musculoskeletal:        General: Normal range of motion.     Cervical back: Normal range of motion.  Neurological:     General: No focal deficit present.     Mental Status: He is alert and oriented to person, place, and time.    Review of Systems  Constitutional: Negative.   HENT: Negative.    Eyes: Negative.   Respiratory: Negative.    Cardiovascular: Negative.   Gastrointestinal: Negative.   Genitourinary: Negative.   Musculoskeletal: Negative.   Neurological: Negative.   Endo/Heme/Allergies: Negative.   Psychiatric/Behavioral:  Positive for depression.    Blood pressure 120/77, pulse 90, temperature 98.2 F (36.8 C), temperature source Oral, resp. rate 18, SpO2 99%. There is no height or weight on file to calculate BMI.  Demographic Factors:  Male, Low socioeconomic status, and Unemployed  Loss Factors: Financial problems/change in socioeconomic status  Historical Factors: Family history of mental illness or substance abuse and Impulsivity  Risk Reduction Factors:   Sense of responsibility to family, Religious beliefs about death, and Positive therapeutic relationship  Continued Clinical Symptoms:  Depression:   Anhedonia  Cognitive Features That Contribute To Risk:  None  Suicide Risk:  Mild:  Suicidal ideation of limited frequency, intensity, duration, and specificity.  There are no identifiable plans, no  associated intent, mild dysphoria and related symptoms, good self-control (both objective and subjective assessment), few other risk factors, and identifiable protective factors, including available and accessible social support.  Plan Of Care/Follow-up recommendations:  Pt discharged with a list of shelters as well as residential substance abuse treatment programs.  He is also provided with a bus passes for transportation upon discharge.  He is also encouraged to follow-up with [facility] open access to continue with medication management and eventually restart Abilify  long-acting injection.  Disposition: Discharged home with outpatient resources.  While future psychiatric events cannot be accurately predicted, the patient does not currently require acute inpatient psychiatric care and does not currently meet [place]  involuntary commitment criteria. Patient was able to engage in safety planning including plan to return to [facility] or contact emergency services if he feels unable to maintain his own safety or the safety of others.     Davia Erps, NP 04/12/2024, 9:24 AM

## 2024-04-12 NOTE — ED Notes (Signed)
 Pt observed/assessed in recliner sleeping. RR even and unlabored, appearing in no noted distress. Environmental check complete, will continue to monitor for safety

## 2024-04-12 NOTE — Discharge Instructions (Signed)
 Discharge recommendations:   Medications: Please continue to take Abilify  10mg  in the morning and Wellbutrin  150mg  in the morning. A 14 day supply was sent to the pharmacy listed. Patient is to take medications as prescribed. The patient or patient's guardian is to contact a medical professional and/or outpatient provider to address any new side effects that develop. The patient or the patient's guardian should update outpatient providers of any new medications and/or medication changes.    Outpatient Follow up: Please review list of outpatient resources for psychiatry and counseling. Please follow up with your primary care provider for all medical related needs.    Therapy: We recommend that patient participate in individual therapy to address mental health concerns.   Atypical antipsychotics: If you are prescribed an atypical antipsychotic, it is recommended that your height, weight, BMI, blood pressure, fasting lipid panel, and fasting blood sugar be monitored by your outpatient providers.  Safety:   The following safety precautions should be taken:   No sharp objects. This includes scissors, razors, scrapers, and putty knives.   Chemicals should be removed and locked up.   Medications should be removed and locked up.   Weapons should be removed and locked up. This includes firearms, knives and instruments that can be used to cause injury.   The patient should abstain from use of illicit substances/drugs and abuse of any medications.  If symptoms worsen or do not continue to improve or if the patient becomes actively suicidal or homicidal then it is recommended that the patient return to the closest hospital emergency department, the [facility], or call 911 for further evaluation and treatment. National Suicide Prevention Lifeline 1-800-SUICIDE or (785)820-4609.  About 988 988 offers 24/7 access to trained crisis counselors who can help people  experiencing mental health-related distress. People can call or text 988 or chat 988lifeline.org for themselves or if they are worried about a loved one who may need crisis support.   You are encouraged to follow up with [facility] for outpatient treatment.  Walk in/ Open Access Hours: PLEASE ARRIVE AT 7:00AM Monday - Friday 8AM - 11AM (To see provider and therapist) Friday - 1PM - 4PM (To see therapist only)  [facility] [address] [place], [place] [phone]

## 2024-05-06 ENCOUNTER — Ambulatory Visit: Payer: MEDICAID | Admitting: Infectious Disease

## 2024-05-07 ENCOUNTER — Encounter: Payer: Self-pay | Admitting: Infectious Disease

## 2024-05-07 DIAGNOSIS — Z1212 Encounter for screening for malignant neoplasm of rectum: Secondary | ICD-10-CM | POA: Insufficient documentation

## 2024-05-07 HISTORY — DX: Encounter for screening for malignant neoplasm of rectum: Z12.12

## 2024-05-07 NOTE — Progress Notes (Deleted)
 Chief complaint: follow-up for HIV disease on medications  Subjective:    Patient ID: Kenneth Welch., male    DOB: May 19, 1986, 38 y.o.   MRN: [id number]  HPI   Past Medical History:  Diagnosis Date   Acute HIV infection (HCC) 04/16/2021   Asthma    Chlamydia 07/13/2021   Gonorrhea 07/13/2021   HIV (human immunodeficiency virus infection) (HCC)    HIV disease (HCC) 02/04/2022   IVDU (intravenous drug user) 01/28/2024   Long term current use of antipsychotic medication 11/29/2023   Methamphetamine use disorder, severe (HCC) 11/29/2023   Monkeypox 07/13/2021   Pain associated with defecation 07/13/2021   Papules 07/13/2021   Rectal bleeding 04/16/2021   Schizophrenia (HCC)    Screening for rectal cancer 05/07/2024    No past surgical history on file.  No family history on file.    Social History   Socioeconomic History   Marital status: Single    Spouse name: Not on file   Number of children: Not on file   Years of education: Not on file   Highest education level: Not on file  Occupational History   Not on file  Tobacco Use   Smoking status: Never   Smokeless tobacco: Never  Vaping Use   Vaping status: Never Used  Substance and Sexual Activity   Alcohol use: Never   Drug use: Yes    Types: Marijuana   Sexual activity: Not Currently    Comment: accepted condoms  Other Topics Concern   Not on file  Social History Narrative   ** Merged History Encounter **       Social Drivers of Health   Financial Resource Strain: Not on file  Food Insecurity: Food Insecurity Present (02/19/2024)   Hunger Vital Sign    Worried About Running Out of Food in the Last Year: Sometimes true    Ran Out of Food in the Last Year: Sometimes true  Transportation Needs: Unmet Transportation Needs (02/19/2024)   PRAPARE - Administrator, Civil Service (Medical): Yes    Lack of Transportation (Non-Medical): Yes  Physical Activity: Not on file  Stress: Not on  file  Social Connections: Not on file    No Known Allergies   Current Outpatient Medications:    ARIPiprazole  (ABILIFY ) 10 MG tablet, Take 1 tablet (10 mg total) by mouth daily., Disp: 14 tablet, Rfl: 0   bictegravir-emtricitabine -tenofovir  AF (BIKTARVY ) 50-200-25 MG TABS tablet, Take 1 tablet by mouth daily., Disp: 30 tablet, Rfl: 0   buPROPion  (WELLBUTRIN  XL) 150 MG 24 hr tablet, Take 1 tablet (150 mg total) by mouth daily., Disp: 14 tablet, Rfl: 0   fluticasone  (FLONASE ) 50 MCG/ACT nasal spray, Place 2 sprays into both nostrils daily as needed for allergies or rhinitis. (Patient not taking: Reported on 04/11/2024), Disp: 11.1 mL, Rfl: 0   hydrOXYzine  (ATARAX ) 25 MG tablet, Take 1 tablet (25 mg total) by mouth 3 (three) times daily as needed for anxiety., Disp: 30 tablet, Rfl: 0   loratadine  (CLARITIN ) 10 MG tablet, Take 1 tablet (10 mg total) by mouth daily as needed for allergies or rhinitis. (Patient not taking: Reported on 04/11/2024), Disp: 30 tablet, Rfl: 0   Naphazoline HCl (CLEAR EYES OP), Place 2 drops into both eyes 4 (four) times daily as needed (For eye irritation)., Disp: , Rfl:    traZODone  (DESYREL ) 50 MG tablet, Take 1 tablet (50 mg total) by mouth at bedtime as needed for sleep., Disp: 30 tablet, Rfl:  0    Review of Systems     Objective:   Physical Exam        Assessment & Plan:

## 2024-05-08 ENCOUNTER — Ambulatory Visit: Payer: MEDICAID | Admitting: Infectious Disease

## 2024-05-08 ENCOUNTER — Telehealth: Payer: Self-pay

## 2024-05-08 DIAGNOSIS — B20 Human immunodeficiency virus [HIV] disease: Secondary | ICD-10-CM

## 2024-05-08 DIAGNOSIS — F1520 Other stimulant dependence, uncomplicated: Secondary | ICD-10-CM

## 2024-05-08 DIAGNOSIS — F4310 Post-traumatic stress disorder, unspecified: Secondary | ICD-10-CM

## 2024-05-08 DIAGNOSIS — F250 Schizoaffective disorder, bipolar type: Secondary | ICD-10-CM

## 2024-05-08 DIAGNOSIS — F1990 Other psychoactive substance use, unspecified, uncomplicated: Secondary | ICD-10-CM

## 2024-05-08 DIAGNOSIS — Z1212 Encounter for screening for malignant neoplasm of rectum: Secondary | ICD-10-CM

## 2024-05-08 NOTE — Telephone Encounter (Signed)
 Left vm with [facility] to reschedule no show from 05/08/24 with Dr.Van Dam. Liana Reding patient numbers incase he was released but unable to leave vm.

## 2024-06-14 ENCOUNTER — Other Ambulatory Visit: Payer: Self-pay

## 2024-06-14 ENCOUNTER — Ambulatory Visit (INDEPENDENT_AMBULATORY_CARE_PROVIDER_SITE_OTHER): Admitting: Internal Medicine

## 2024-06-14 ENCOUNTER — Encounter: Payer: Self-pay | Admitting: Internal Medicine

## 2024-06-14 VITALS — BP 104/73 | HR 81 | Temp 97.2°F

## 2024-06-14 DIAGNOSIS — B20 Human immunodeficiency virus [HIV] disease: Secondary | ICD-10-CM | POA: Diagnosis not present

## 2024-06-14 DIAGNOSIS — Z129 Encounter for screening for malignant neoplasm, site unspecified: Secondary | ICD-10-CM

## 2024-06-14 DIAGNOSIS — Z113 Encounter for screening for infections with a predominantly sexual mode of transmission: Secondary | ICD-10-CM

## 2024-06-14 DIAGNOSIS — Z1159 Encounter for screening for other viral diseases: Secondary | ICD-10-CM

## 2024-06-14 NOTE — Patient Instructions (Signed)
 Today will do blood tests and swabs/urine for hiv/std/hepatitis. Will also do anal cancer screening   Continue biktarvy     When you leave jail please follow up with us  immediately otherwise if you are still in jail then 6 months with us 

## 2024-06-14 NOTE — Progress Notes (Signed)
 [facility]  Reason for Consult:hiv care Referring Provider: self/jail    Patient Active Problem List   Diagnosis Date Noted   Screening for rectal cancer 05/07/2024   Schizoaffective disorder, bipolar type (HCC) 02/19/2024   IVDU (intravenous drug user) 01/28/2024   Arm abscess 01/19/2024   Cellulitis 01/17/2024   PTSD (post-traumatic stress disorder) 11/29/2023   Methamphetamine use disorder, severe (HCC) 11/29/2023   Cannabis use disorder 11/29/2023   Long term current use of antipsychotic medication 11/29/2023   Schizoaffective disorder, depressive type (HCC) 11/27/2023   Homelessness 06/13/2023   Suicidal ideation 06/13/2023   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 is a 38 y.o. male here for reengaging in hiv care after lost to follow up  06/14/24 id clinic visit Came from jail No concern On biktarvy  and has been on continuously since 03/2024 as he has been in jail See a&p for detail  Review of Systems: ROS All other ros negative      Past Medical History:  Diagnosis Date   Acute HIV infection (HCC) 04/16/2021   Asthma    Chlamydia 07/13/2021   Gonorrhea 07/13/2021   HIV (human immunodeficiency virus infection) (HCC)    HIV disease (HCC) 02/04/2022   IVDU (intravenous drug user) 01/28/2024   Long term current use of antipsychotic medication 11/29/2023   Methamphetamine use disorder, severe (HCC) 11/29/2023   Monkeypox 07/13/2021   Pain associated with defecation 07/13/2021   Papules 07/13/2021   Rectal bleeding 04/16/2021   Schizophrenia (HCC)    Screening for rectal cancer  05/07/2024    Social History   Tobacco Use   Smoking status: Never   Smokeless tobacco: Never  Vaping Use   Vaping status: Never Used  Substance Use Topics   Alcohol use: Never   Drug use: Yes    Types: Marijuana    No family history on file.  No Known Allergies  OBJECTIVE: Vitals:   06/14/24 0940  Temp: (!) 97.2 F (36.2 C)  TempSrc: Temporal   There is no height or weight on file to calculate BMI.   Physical Exam General/constitutional: no distress, pleasant; in shackles; police officers present in room HEENT: Normocephalic, PER, Conj Clear, EOMI, Oropharynx clear Neck supple CV: rrr no mrg Lungs: clear to auscultation, normal respiratory effort Abd: Soft, Nontender Ext: no edema Skin: scars in both forearms from prior crystal meths skin popping Neuro: nonfocal MSK: no peripheral joint swelling/tenderness/warmth; back spines nontender  Lab: Lab Results  Component Value Date   WBC 7.7 04/10/2024   HGB 13.5 04/10/2024   HCT 39.8 04/10/2024   MCV 93.2 04/10/2024   PLT 162 04/10/2024   Last metabolic panel Lab Results  Component Value Date   GLUCOSE 73 04/10/2024   NA 143 04/10/2024   K 3.7 04/10/2024   CL 110 04/10/2024   CO2 25 04/10/2024   BUN 26 (H) 04/10/2024   CREATININE 1.16 04/10/2024   GFRNONAA >60 04/10/2024   CALCIUM 9.3 04/10/2024   PROT 7.6 04/10/2024   ALBUMIN 4.2 04/10/2024   LABGLOB 2.2 01/18/2019   AGRATIO 2.1 01/18/2019   BILITOT 0.7 04/10/2024  ALKPHOS 47 04/10/2024   AST 21 04/10/2024   ALT 14 04/10/2024   ANIONGAP 8 04/10/2024   HIV; 01/2024                    /       833 (39%) 11/2023      12k 02/2022      ud 07/2021      21  04/2021      985k       /       522 (25%)  Microbiology:  Serology:  Imaging:   Assessment/plan: Problem List Items Addressed This Visit     HIV disease (HCC) - Primary   Relevant Orders   T-helper cells (CD4) count   HIV 1 RNA quant-no reflex-bld   Other Visit Diagnoses        Screening for STDs (sexually transmitted diseases)       Relevant Orders   Urine cytology ancillary only   Cytology (oral, anal, urethral) ancillary only   Cytology (oral, anal, urethral) ancillary only   RPR     Cancer screening       Relevant Orders   Cytology - PAP     Need for hepatitis B screening test       Relevant Orders   Hepatitis B surface antibody,quantitative   Hepatitis B Surface AntiGEN   Hepatitis B Core Antibody, total     Need for hepatitis C screening test       Relevant Orders   Hepatitis C Antibody         38 y.o. male coming from jail 06/14/24 for reengagement in [facility] hiv care   #hiv Dx'ed 03/2021 suspected acute retroviral syndrome; risk msm Cd4 nadir Therapy: 04/2021-c biktarvy   06/14/24. Last seen in id clinic 02/2022. Was on biktarvy  for the past 4 months; no missed dose as he is currently in jail  #social -doesn't have a home if he would be released from jail today -has been in jail since 03/2024 off and on and back in jail since 05/2024 -no smoking, drinking etoh -hx crystal meths skin popping -- last used 05/2024   #rectal bleeding Recurrent/chronic rectal bleeding and at several times positive for rectal gonorrhea/chlamydia 04/2021, rectal chlamydia 07/2021 06/14/2024 - resolved earlier last year)   #other medical problem Schizoaffective disorder -- follows psychiatrist -- takes abilify  Asthma - mild intermittent not on any inhaler   #other meds Biktarvy  Abilify     #hcm -cad risk Will discuss reprieve at age 20 -vaccination Tdap 2020 Meningococcal 2023 -hepatitis 02/2024 hep b and c serology negative 06/2024 repeat hep b / hep c serology repeat -std 06/2024 rpr and triple screen -tb 04/2021 quantiferon gold negative -anal cancer screening 06/2024 recommend anal pap       Follow-up: Return in about 6 months (around 12/15/2024).  Kenneth ONEIDA Passer, MD [facility] 06/14/2024, 9:48  AM

## 2024-06-14 NOTE — Addendum Note (Signed)
 Addended by: CELESTIA LELA HERO on: 06/14/2024 10:26 AM   Modules accepted: Orders

## 2024-06-15 LAB — C. TRACHOMATIS/N. GONORRHOEAE RNA
C. trachomatis RNA, TMA: NOT DETECTED
N. gonorrhoeae RNA, TMA: NOT DETECTED

## 2024-06-15 LAB — GC/CHLAMYDIA PROBE, AMP (THROAT)
Chlamydia trachomatis RNA: NOT DETECTED
Neisseria gonorrhoeae RNA: NOT DETECTED

## 2024-06-15 LAB — CT/NG RNA, TMA RECTAL
Chlamydia Trachomatis RNA: NOT DETECTED
Neisseria Gonorrhoeae RNA: NOT DETECTED

## 2024-06-18 LAB — HEPATITIS B CORE ANTIBODY, TOTAL: Hep B Core Total Ab: NONREACTIVE

## 2024-06-18 LAB — CYTOLOGY - NON PAP

## 2024-06-18 LAB — HEPATITIS C ANTIBODY: Hepatitis C Ab: NONREACTIVE

## 2024-06-18 LAB — HEPATITIS B SURFACE ANTIGEN: Hepatitis B Surface Ag: NONREACTIVE

## 2024-06-18 LAB — HIV-1 RNA QUANT-NO REFLEX-BLD
HIV 1 RNA Quant: NOT DETECTED {copies}/mL
HIV-1 RNA Quant, Log: NOT DETECTED {Log_copies}/mL

## 2024-06-18 LAB — RPR: RPR Ser Ql: NONREACTIVE

## 2024-06-18 LAB — T-HELPER CELLS (CD4) COUNT (NOT AT ARMC)
Absolute CD4: 1015 {cells}/uL (ref 490–1740)
CD4 T Helper %: 31 % (ref 30–61)
Total lymphocyte count: 3299 {cells}/uL (ref 850–3900)

## 2024-06-18 LAB — HEPATITIS B SURFACE ANTIBODY, QUANTITATIVE: Hep B S AB Quant (Post): 661 m[IU]/mL (ref >=10)

## 2024-06-19 ENCOUNTER — Ambulatory Visit: Payer: Self-pay | Admitting: Internal Medicine

## 2024-07-22 ENCOUNTER — Other Ambulatory Visit: Payer: Self-pay

## 2024-07-22 MED ORDER — BIKTARVY 50-200-25 MG PO TABS: 1.0000 | ORAL_TABLET | Freq: Every day | ORAL | 0 refills | Status: DC

## 2024-07-24 ENCOUNTER — Other Ambulatory Visit: Payer: Self-pay

## 2024-07-24 MED ORDER — BIKTARVY 50-200-25 MG PO TABS
1.0000 | ORAL_TABLET | Freq: Every day | ORAL | 5 refills | Status: AC
Start: 2024-07-24 — End: ?

## 2024-07-31 ENCOUNTER — Telehealth: Payer: Self-pay

## 2024-07-31 NOTE — Telephone Encounter (Signed)
 Received fax from [facility] stating they have been unable to get in touch with the patient. Called patient on all three numbers listed in chart, unable to reach him.   Sherif Millspaugh, BSN, RN

## 2024-09-10 ENCOUNTER — Emergency Department (HOSPITAL_COMMUNITY)
Admission: EM | Admit: 2024-09-10 | Discharge: 2024-09-10 | Disposition: A | Discharge status: Home / Self Care | Attending: Emergency Medicine | Admitting: Emergency Medicine

## 2024-09-10 ENCOUNTER — Encounter (HOSPITAL_COMMUNITY): Payer: Self-pay

## 2024-09-10 ENCOUNTER — Other Ambulatory Visit: Payer: Self-pay

## 2024-09-10 DIAGNOSIS — F1590 Other stimulant use, unspecified, uncomplicated: Secondary | ICD-10-CM | POA: Insufficient documentation

## 2024-09-10 DIAGNOSIS — Z21 Asymptomatic human immunodeficiency virus [HIV] infection status: Secondary | ICD-10-CM | POA: Insufficient documentation

## 2024-09-10 DIAGNOSIS — M79632 Pain in left forearm: Secondary | ICD-10-CM | POA: Diagnosis present

## 2024-09-10 DIAGNOSIS — L03114 Cellulitis of left upper limb: Secondary | ICD-10-CM | POA: Insufficient documentation

## 2024-09-10 DIAGNOSIS — J45909 Unspecified asthma, uncomplicated: Secondary | ICD-10-CM | POA: Insufficient documentation

## 2024-09-10 MED ORDER — DOXYCYCLINE HYCLATE 100 MG PO TABS
100.0000 mg | ORAL_TABLET | Freq: Once | ORAL | Status: AC
Start: 2024-09-10 — End: 2024-09-10
  Administered 2024-09-10: 100 mg via ORAL
  Filled 2024-09-10: qty 1

## 2024-09-10 MED ORDER — DOXYCYCLINE HYCLATE 100 MG PO CAPS: 100.0000 mg | ORAL_CAPSULE | Freq: Two times a day (BID) | ORAL | 0 refills | Status: DC

## 2024-09-10 NOTE — ED Provider Notes (Signed)
 [place] EMERGENC[facility] DEPARTMENT AT [facility] Provider Note   CSN: [id number] Arrival date & time: 09/10/24  2117     Patient presents with: Arm Injury   Kenneth Welch. is a 38 y.o. male.    Arm Injury Patient left forearm erythema.  States been attempting to shoot up meth states he missed the vein and injected in the forearm.  No fevers no chills.  States today began to have more redness    No systemic issues.    Past Medical History:  Diagnosis Date   Acute HIV infection (HCC) 04/16/2021   Asthma    Chlamydia 07/13/2021   Gonorrhea 07/13/2021   HIV (human immunodeficiency virus infection) (HCC)    HIV disease (HCC) 02/04/2022   IVDU (intravenous drug user) 01/28/2024   Long term current use of antipsychotic medication 11/29/2023   Methamphetamine use disorder, severe (HCC) 11/29/2023   Monkeypox 07/13/2021   Pain associated with defecation 07/13/2021   Papules 07/13/2021   Rectal bleeding 04/16/2021   Schizophrenia (HCC)    Screening for rectal cancer 05/07/2024    Prior to Admission medications   Medication Sig Start Date End Date Taking? Authorizing Provider  doxycycline  (VIBRAMYCIN ) 100 MG capsule Take 1 capsule (100 mg total) by mouth 2 (two) times daily. 09/10/24  Yes Patsey Lot, MD  ARIPiprazole  (ABILIFY ) 10 MG tablet Take 1 tablet (10 mg total) by mouth daily. Patient not taking: Reported on 06/14/2024 04/12/24   Brent, Amanda C, NP  bictegravir-emtricitabine -tenofovir  AF (BIKTARVY ) 50-200-25 MG TABS tablet Take 1 tablet by mouth daily. 07/24/24   Vu, Constance T, MD  buPROPion  (WELLBUTRIN  XL) 150 MG 24 hr tablet Take 1 tablet (150 mg total) by mouth daily. Patient not taking: Reported on 06/14/2024 04/12/24   Brent, Amanda C, NP  fluticasone  (FLONASE ) 50 MCG/ACT nasal spray Place 2 sprays into both nostrils daily as needed for allergies or rhinitis. Patient not taking: Reported on 06/14/2024 02/24/24   Marry Clamp, MD  hydrOXYzine  (ATARAX ) 25 MG  tablet Take 1 tablet (25 mg total) by mouth 3 (three) times daily as needed for anxiety. Patient not taking: Reported on 06/14/2024 02/24/24   Marry Clamp, MD  loratadine  (CLARITIN ) 10 MG tablet Take 1 tablet (10 mg total) by mouth daily as needed for allergies or rhinitis. Patient not taking: Reported on 06/14/2024 02/24/24   Marry Clamp, MD  Naphazoline HCl (CLEAR EYES OP) Place 2 drops into both eyes 4 (four) times daily as needed (For eye irritation). Patient not taking: Reported on 06/14/2024    [provider]  traZODone  (DESYREL ) 50 MG tablet Take 1 tablet (50 mg total) by mouth at bedtime as needed for sleep. Patient not taking: Reported on 06/14/2024 02/24/24   Marry Clamp, MD    Allergies: Patient has no known allergies.    Review of Systems  Updated Vital Signs BP 119/72 (BP Location: Right Arm)   Pulse 97   Temp 98.1 F (36.7 C) (Oral)   Resp 18   SpO2 97%   Physical Exam Vitals and nursing note reviewed.  Cardiovascular:     Rate and Rhythm: Regular rhythm.  Abdominal:     Tenderness: There is no abdominal tenderness.  Musculoskeletal:     Comments: Injection sites to bilateral forearms.  On left forearm much of the anterior forearm is erythematous but only induration for about a 3 cm area anteriorly.  No fluctuance.  No drainage.  Skin:    Capillary Refill: Capillary refill takes  less than 2 seconds.  Neurological:     Mental Status: He is alert.     (all labs ordered are listed, but only abnormal results are displayed) Labs Reviewed - No data to display  EKG: None  Radiology: No results found.   Procedures   Medications Ordered in the ED  doxycycline  (VIBRA -TABS) tablet 100 mg (has no administration in time range)                                    Medical Decision Making Risk Prescription drug management.   Patient with forearm pain.  Appears to be cellulitis.  Bedside ultrasound done and showed cellulitis without abscess.   Well-appearing.  Reviewed previous ID note and 3 months ago had CD4 count over thousand with his HIV.  Do not think blood work would add much at this time.  Does not appear septic.  Will give dose of doxycycline .  Follow-up in the next couple days for recheck.  Can go to ER.  Discharge.     Final diagnoses:  Cellulitis of forearm, left  Methamphetamine use    ED Discharge Orders          Ordered    doxycycline  (VIBRAMYCIN ) 100 MG capsule  2 times daily        09/10/24 2254               Patsey Lot, MD 09/10/24 2318

## 2024-09-10 NOTE — Discharge Instructions (Addendum)
 Take the antibiotic.  Follow-up in a couple days for recheck if symptoms or not improving.  You can follow-up with the resources given for the methamphetamine use.

## 2024-09-10 NOTE — ED Triage Notes (Signed)
 PT bib [facility]. Per EMS PT tried to shoot up meth and missed the vein injecting the drug into the muscle of his arm. PT has two sores on left forearm. Hx of HIV, PT states he would like help getting into rehab.

## 2024-09-11 ENCOUNTER — Ambulatory Visit (HOSPITAL_COMMUNITY)
Admission: EM | Admit: 2024-09-11 | Discharge: 2024-09-13 | Disposition: A | Discharge status: Home / Self Care | Attending: Psychiatry | Admitting: Psychiatry

## 2024-09-11 DIAGNOSIS — Z79899 Other long term (current) drug therapy: Secondary | ICD-10-CM | POA: Diagnosis not present

## 2024-09-11 DIAGNOSIS — Z5900 Homelessness unspecified: Secondary | ICD-10-CM | POA: Diagnosis not present

## 2024-09-11 DIAGNOSIS — F1520 Other stimulant dependence, uncomplicated: Secondary | ICD-10-CM | POA: Diagnosis not present

## 2024-09-11 DIAGNOSIS — L03114 Cellulitis of left upper limb: Secondary | ICD-10-CM | POA: Insufficient documentation

## 2024-09-11 LAB — LIPID PANEL
Cholesterol: 148 mg/dL (ref 0–200)
HDL: 47 mg/dL (ref >40)
LDL Cholesterol: 92 mg/dL (ref 0–99)
Total CHOL/HDL Ratio: 3.1 ratio
Triglycerides: 44 mg/dL (ref <150)
VLDL: 9 mg/dL (ref 0–40)

## 2024-09-11 LAB — URINALYSIS, ROUTINE W REFLEX MICROSCOPIC
Bacteria, UA: NONE SEEN
Bilirubin Urine: NEGATIVE
Glucose, UA: NEGATIVE mg/dL
Hgb urine dipstick: NEGATIVE
Ketones, ur: 20 mg/dL — AB
Leukocytes,Ua: NEGATIVE
Nitrite: NEGATIVE
Protein, ur: 30 mg/dL — AB
Specific Gravity, Urine: 1.035 — ABNORMAL HIGH (ref 1.005–1.030)
pH: 5 (ref 5.0–8.0)

## 2024-09-11 LAB — CBC WITH DIFFERENTIAL/PLATELET
Abs Immature Granulocytes: 0.03 K/uL (ref 0.00–0.07)
Basophils Absolute: 0 K/uL (ref 0.0–0.1)
Basophils Relative: 0 %
Eosinophils Absolute: 0.3 K/uL (ref 0.0–0.5)
Eosinophils Relative: 2 %
HCT: 40 % (ref 39.0–52.0)
Hemoglobin: 13.6 g/dL (ref 13.0–17.0)
Immature Granulocytes: 0 %
Lymphocytes Relative: 24 %
Lymphs Abs: 2.6 K/uL (ref 0.7–4.0)
MCH: 32.3 pg (ref 26.0–34.0)
MCHC: 34 g/dL (ref 30.0–36.0)
MCV: 95 fL (ref 80.0–100.0)
Monocytes Absolute: 0.7 K/uL (ref 0.1–1.0)
Monocytes Relative: 7 %
Neutro Abs: 6.9 K/uL (ref 1.7–7.7)
Neutrophils Relative %: 67 %
Platelets: 242 K/uL (ref 150–400)
RBC: 4.21 MIL/uL — ABNORMAL LOW (ref 4.22–5.81)
RDW: 13.5 % (ref 11.5–15.5)
WBC: 10.5 K/uL (ref 4.0–10.5)
nRBC: 0 % (ref 0.0–0.2)

## 2024-09-11 LAB — COMPREHENSIVE METABOLIC PANEL WITH GFR
ALT: 25 U/L (ref 0–44)
AST: 25 U/L (ref 15–41)
Albumin: 4 g/dL (ref 3.5–5.0)
Alkaline Phosphatase: 45 U/L (ref 38–126)
Anion gap: 12 (ref 5–15)
BUN: 29 mg/dL — ABNORMAL HIGH (ref 6–20)
CO2: 23 mmol/L (ref 22–32)
Calcium: 9.1 mg/dL (ref 8.9–10.3)
Chloride: 101 mmol/L (ref 98–111)
Creatinine, Ser: 1.02 mg/dL (ref 0.61–1.24)
GFR, Estimated: >60 mL/min (ref >60)
Glucose, Bld: 123 mg/dL — ABNORMAL HIGH (ref 70–99)
Potassium: 3.5 mmol/L (ref 3.5–5.1)
Sodium: 136 mmol/L (ref 135–145)
Total Bilirubin: 0.9 mg/dL (ref 0.0–1.2)
Total Protein: 8 g/dL (ref 6.5–8.1)

## 2024-09-11 LAB — POCT URINE DRUG SCREEN - MANUAL ENTRY (I-SCREEN)
POC Amphetamine UR: POSITIVE — AB
POC Buprenorphine (BUP): NOT DETECTED
POC Cocaine UR: NOT DETECTED
POC Marijuana UR: POSITIVE — AB
POC Methadone UR: NOT DETECTED
POC Methamphetamine UR: POSITIVE — AB
POC Morphine: NOT DETECTED
POC Oxazepam (BZO): NOT DETECTED
POC Oxycodone UR: NOT DETECTED
POC Secobarbital (BAR): NOT DETECTED

## 2024-09-11 LAB — ETHANOL: Alcohol, Ethyl (B): <15 mg/dL (ref <15)

## 2024-09-11 LAB — MAGNESIUM: Magnesium: 2.3 mg/dL (ref 1.7–2.4)

## 2024-09-11 LAB — HEMOGLOBIN A1C
Hgb A1c MFr Bld: 5.4 % (ref 4.8–5.6)
Mean Plasma Glucose: 108.28 mg/dL

## 2024-09-11 LAB — TSH: TSH: 1.584 u[IU]/mL (ref 0.350–4.500)

## 2024-09-11 MED ORDER — HALOPERIDOL LACTATE 5 MG/ML IJ SOLN: 5.0000 mg | Freq: Three times a day (TID) | INTRAMUSCULAR | Status: DC | PRN

## 2024-09-11 MED ORDER — ARIPIPRAZOLE 10 MG PO TABS
10.0000 mg | ORAL_TABLET | Freq: Every day | ORAL | Status: DC
  Administered 2024-09-11 – 2024-09-13 (×3): 10 mg via ORAL
  Filled 2024-09-11 (×3): qty 1

## 2024-09-11 MED ORDER — ALUM & MAG HYDROXIDE-SIMETH 200-200-20 MG/5ML PO SUSP: 30.0000 mL | ORAL | Status: DC | PRN

## 2024-09-11 MED ORDER — HYDROXYZINE HCL 25 MG PO TABS
25.0000 mg | ORAL_TABLET | Freq: Three times a day (TID) | ORAL | Status: DC | PRN
  Administered 2024-09-11 – 2024-09-12 (×2): 25 mg via ORAL
  Filled 2024-09-11 (×2): qty 1

## 2024-09-11 MED ORDER — LORAZEPAM 2 MG/ML IJ SOLN: 2.0000 mg | Freq: Three times a day (TID) | INTRAMUSCULAR | Status: DC | PRN

## 2024-09-11 MED ORDER — HALOPERIDOL LACTATE 5 MG/ML IJ SOLN: 10.0000 mg | Freq: Three times a day (TID) | INTRAMUSCULAR | Status: DC | PRN

## 2024-09-11 MED ORDER — ACETAMINOPHEN 325 MG PO TABS
650.0000 mg | ORAL_TABLET | Freq: Four times a day (QID) | ORAL | Status: DC | PRN
  Administered 2024-09-12: 650 mg via ORAL
  Filled 2024-09-11: qty 2

## 2024-09-11 MED ORDER — BUPROPION HCL ER (XL) 150 MG PO TB24
150.0000 mg | ORAL_TABLET | Freq: Every day | ORAL | Status: DC
  Administered 2024-09-11 – 2024-09-13 (×3): 150 mg via ORAL
  Filled 2024-09-11 (×3): qty 1

## 2024-09-11 MED ORDER — MAGNESIUM HYDROXIDE 400 MG/5ML PO SUSP: 30.0000 mL | Freq: Every day | ORAL | Status: DC | PRN

## 2024-09-11 MED ORDER — HALOPERIDOL 5 MG PO TABS: 5.0000 mg | ORAL_TABLET | Freq: Three times a day (TID) | ORAL | Status: DC | PRN

## 2024-09-11 MED ORDER — BICTEGRAVIR-EMTRICITAB-TENOFOV 50-200-25 MG PO TABS
1.0000 | ORAL_TABLET | Freq: Every day | ORAL | Status: DC
  Administered 2024-09-11 – 2024-09-13 (×3): 1 via ORAL
  Filled 2024-09-11 (×3): qty 1

## 2024-09-11 MED ORDER — DOXYCYCLINE HYCLATE 100 MG PO TABS
100.0000 mg | ORAL_TABLET | Freq: Two times a day (BID) | ORAL | Status: DC
  Administered 2024-09-11 – 2024-09-13 (×4): 100 mg via ORAL
  Filled 2024-09-11 (×4): qty 1

## 2024-09-11 MED ORDER — DIPHENHYDRAMINE HCL 50 MG PO CAPS: 50.0000 mg | ORAL_CAPSULE | Freq: Three times a day (TID) | ORAL | Status: DC | PRN

## 2024-09-11 MED ORDER — TRAZODONE HCL 50 MG PO TABS
50.0000 mg | ORAL_TABLET | Freq: Every evening | ORAL | Status: DC | PRN
  Administered 2024-09-11 – 2024-09-12 (×2): 50 mg via ORAL
  Filled 2024-09-11 (×2): qty 1

## 2024-09-11 MED ORDER — DIPHENHYDRAMINE HCL 50 MG/ML IJ SOLN: 50.0000 mg | Freq: Three times a day (TID) | INTRAMUSCULAR | Status: DC | PRN

## 2024-09-11 NOTE — ED Notes (Signed)
 Pt A&O x 4, resting at present, no distress noted, comfort measures given.  Monitoring for safety.
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
## 2024-09-11 NOTE — ED Notes (Signed)
 VOL arrived to obs bed 4 from assessment.  Denied current SI plan and intent.  Pt requests rehab and [facility] [place] on Friday. Pt seen in ED yesterday and has cellulitis on upper inner forearm after trying to shoot meth  and missed.   Would cover with meplex  Swollen and warm. Pt note to have many circular shaped scars on bilateral arms and feet

## 2024-09-11 NOTE — ED Notes (Signed)
 Pt sleeping at present, no distress noted. Respirations even & unlabored.  Monitoring for safety.
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
## 2024-09-12 NOTE — ED Notes (Signed)
 Pt sleeping at this time. Rise and fall of chest noted. Pt in NAD at this time. Will continue to monitor.

## 2024-09-12 NOTE — ED Notes (Signed)
 Pt asleep at this hour. No apparent distress. RR even and unlabored. Monitored for safety.

## 2024-09-12 NOTE — ED Notes (Addendum)
 Pt A&Ox4, calm & cooperative and in NAD at this time. Denies SI/HI/AVH. Pt is minimal in interactions. Contracts for safety. Encouragement and support given. Will continue to monitor.

## 2024-09-12 NOTE — ED Provider Notes (Signed)
 Behavioral Health Progress Note  Date and Time: 09/12/2024 11:51 AM Name: Kenneth Welch. MRN:  [id number]  Subjective:  Kenneth Welch. Is a 38 y/o male with a history of schizophrenia/schizoaffective disorder, post-traumatic stress disorder (PTSD), and methamphetamine abuse. Patient presented voluntarily to [facility] on 09/11/2024 reporting he is experiencing the urge of using drugs while he is waiting to be transferred to rehab in [place]. States he as been accepted at a rehab program  in [place] , departing this Friday 09/13/2024. Patient went to see his therapist Herschell Hummer at [facility] and discussed about his impulsive thoughts of using drugs while preparing to go to rehab.  His therapist recommended observation to transition to rehab. Kenneth Welch., is seen face to face by this provider and chart reviewed on 09/12/24.    On evaluation Kenneth Welch. Reports that he slept well last night and has a good appetite. He denies current suicidal ideations, homicidal ideations, hallucinations and paranoia. Pt states that  he was recommended to come here by his therapist to restart his psychotropic medications prior to transitioning to the rehab facility in [place]. Pt states that he feels ready to residential substance abuse treatment and is hopeful it works since he will be in [place] and away from familiar environment. Pt states that counselor will be picking him up in the morning and taking him to [facility] in [place] . Pt was restarted on medications including Wellbutrin  and Abilify ,  he has been compliant and denies any side effects. Pt does have cellulitis to the left forearm which is being treated with Doxycycline   and is dressed with a large bandage. Pt denies worsening pain and swelling and is compliant with antibiotic treatment.   During evaluation Kenneth Welch. is lying down in bed, in no acute distress.  He is alert & oriented x 4, calm, cooperative and  attentive for this assessment.  His mood is dysphoric with congruent affect.  He has normal speech, and behavior.  Objectively there is no evidence of psychosis/mania or delusional thinking. Pt does not appear to be responding to internal or external stimuli.  Patient is able to converse coherently, goal directed thoughts, no distractibility, or pre-occupation.  He currently denies suicidal/self-harm/homicidal ideation, psychosis, and paranoia.  Patient answered assessment questions appropriately.     Diagnosis:  Final diagnoses:  Methamphetamine addiction (HCC)    Total Time spent with patient: 20 minutes  Past Psychiatric History:  Substance abuse, MDD, PTSD, Schizophrenia  Past Medical History: HIV, Cellulitis to left arm- currently being treated with antibiotics.  Family History:None reported Family Psychiatric  History: None reported Social History: Pt currently homeless and unemployed. Endorses methamphetamine use.   Additional Social History:    Pain Medications: See MAR Prescriptions: See MAR Over the Counter: See MAR History of alcohol / drug use?: Yes Longest period of sobriety (when/how long): Unknown Negative Consequences of Use:  (none reported) Withdrawal Symptoms: None (none reported) Name of Substance 1: crystal meth 1 - Age of First Use: 34 1 - Amount (size/oz): 1 gm 1 - Frequency: 3 times a week 1 - Duration: ongoing 1 - Last Use / Amount: 2 days ago 1 - Method of Aquiring: unknown 1- Route of Use: smoke                  Sleep: Good  Appetite:  Good  Current Medications:  Current Facility-Administered Medications  Medication Dose Route Frequency Provider Last Rate  Last Admin   acetaminophen  (TYLENOL ) tablet 650 mg  650 mg Oral Q6H PRN Randall Starlyn HERO, NP       alum & mag hydroxide-simeth (MAALOX/MYLANTA) 200-200-20 MG/5ML suspension 30 mL  30 mL Oral Q4H PRN Randall Starlyn HERO, NP       ARIPiprazole  (ABILIFY ) tablet 10 mg  10 mg Oral  Daily Byungura, Veronique M, NP   10 mg at 09/12/24 0930   bictegravir-emtricitabine -tenofovir  AF (BIKTARVY ) 50-200-25 MG per tablet 1 tablet  1 tablet Oral Daily Randall Starlyn HERO, NP   1 tablet at 09/12/24 0930   buPROPion  (WELLBUTRIN  XL) 24 hr tablet 150 mg  150 mg Oral Daily Byungura, Veronique M, NP   150 mg at 09/12/24 0930   haloperidol  (HALDOL ) tablet 5 mg  5 mg Oral TID PRN Randall Starlyn HERO, NP       And   diphenhydrAMINE  (BENADRYL ) capsule 50 mg  50 mg Oral TID PRN Randall Starlyn HERO, NP       haloperidol  lactate (HALDOL ) injection 5 mg  5 mg Intramuscular TID PRN Randall Starlyn HERO, NP       And   diphenhydrAMINE  (BENADRYL ) injection 50 mg  50 mg Intramuscular TID PRN Randall Starlyn HERO, NP       And   LORazepam  (ATIVAN ) injection 2 mg  2 mg Intramuscular TID PRN Randall Starlyn HERO, NP       haloperidol  lactate (HALDOL ) injection 10 mg  10 mg Intramuscular TID PRN Randall Starlyn HERO, NP       And   diphenhydrAMINE  (BENADRYL ) injection 50 mg  50 mg Intramuscular TID PRN Randall Starlyn HERO, NP       And   LORazepam  (ATIVAN ) injection 2 mg  2 mg Intramuscular TID PRN Byungura, Veronique M, NP       doxycycline  (VIBRA -TABS) tablet 100 mg  100 mg Oral Q12H Randall, Veronique M, NP   100 mg at 09/12/24 0930   hydrOXYzine  (ATARAX ) tablet 25 mg  25 mg Oral TID PRN Randall Starlyn HERO, NP   25 mg at 09/11/24 1953   magnesium  hydroxide (MILK OF MAGNESIA) suspension 30 mL  30 mL Oral Daily PRN Randall Starlyn HERO, NP       traZODone  (DESYREL ) tablet 50 mg  50 mg Oral QHS PRN Randall, Veronique M, NP   50 mg at 09/11/24 1953   Current Outpatient Medications  Medication Sig Dispense Refill   ARIPiprazole  (ABILIFY ) 10 MG tablet Take 1 tablet (10 mg total) by mouth daily. 14 tablet 0   bictegravir-emtricitabine -tenofovir  AF (BIKTARVY ) 50-200-25 MG TABS tablet Take 1 tablet by mouth daily. 30 tablet 5   hydrocortisone cream 1 % Apply 1 Application topically 2  (two) times daily as needed for itching.     loratadine  (CLARITIN ) 10 MG tablet Take 1 tablet (10 mg total) by mouth daily as needed for allergies or rhinitis. 30 tablet 0    Labs  Lab Results:  Admission on 09/11/2024  Component Date Value Ref Range Status   WBC 09/11/2024 10.5  4.0 - 10.5 K/uL Final   RBC 09/11/2024 4.21 (L)  4.22 - 5.81 MIL/uL Final   Hemoglobin 09/11/2024 13.6  13.0 - 17.0 g/dL Final   HCT 89/91/7974 40.0  39.0 - 52.0 % Final   MCV 09/11/2024 95.0  80.0 - 100.0 fL Final   MCH 09/11/2024 32.3  26.0 - 34.0 pg Final   MCHC 09/11/2024 34.0  30.0 - 36.0 g/dL Final   RDW 89/91/7974 13.5  11.5 - 15.5 % Final   Platelets 09/11/2024 242  150 - 400 K/uL Final   nRBC 09/11/2024 0.0  0.0 - 0.2 % Final   Neutrophils Relative % 09/11/2024 67  % Final   Neutro Abs 09/11/2024 6.9  1.7 - 7.7 K/uL Final   Lymphocytes Relative 09/11/2024 24  % Final   Lymphs Abs 09/11/2024 2.6  0.7 - 4.0 K/uL Final   Monocytes Relative 09/11/2024 7  % Final   Monocytes Absolute 09/11/2024 0.7  0.1 - 1.0 K/uL Final   Eosinophils Relative 09/11/2024 2  % Final   Eosinophils Absolute 09/11/2024 0.3  0.0 - 0.5 K/uL Final   Basophils Relative 09/11/2024 0  % Final   Basophils Absolute 09/11/2024 0.0  0.0 - 0.1 K/uL Final   Immature Granulocytes 09/11/2024 0  % Final   Abs Immature Granulocytes 09/11/2024 0.03  0.00 - 0.07 K/uL Final   Performed at [facility], [address] [place], [place] [address]   Sodium 09/11/2024 136  135 - 145 mmol/L Final   Potassium 09/11/2024 3.5  3.5 - 5.1 mmol/L Final   Chloride 09/11/2024 101  98 - 111 mmol/L Final   CO2 09/11/2024 23  22 - 32 mmol/L Final   Glucose, Bld 09/11/2024 123 (H)  70 - 99 mg/dL Final   Glucose reference range applies only to samples taken after fasting for at least 8 hours.   BUN 09/11/2024 29 (H)  6 - 20 mg/dL Final   Creatinine, Ser 09/11/2024 1.02  0.61 - 1.24 mg/dL Final   Calcium 89/91/7974 9.1  8.9 - 10.3 mg/dL Final   Total  Protein 09/11/2024 8.0  6.5 - 8.1 g/dL Final   Albumin 89/91/7974 4.0  3.5 - 5.0 g/dL Final   AST 89/91/7974 25  15 - 41 U/L Final   ALT 09/11/2024 25  0 - 44 U/L Final   Alkaline Phosphatase 09/11/2024 45  38 - 126 U/L Final   Total Bilirubin 09/11/2024 0.9  0.0 - 1.2 mg/dL Final   GFR, Estimated 09/11/2024 >60  >60 mL/min Final   Comment: (NOTE) Calculated using the CKD-EPI Creatinine Equation (2021)    Anion gap 09/11/2024 12  5 - 15 Final   Performed at [facility], [address] [place], [place] [address]   Hgb A1c MFr Bld 09/11/2024 5.4  4.8 - 5.6 % Final   Comment: (NOTE) Diagnosis of Diabetes The following HbA1c ranges recommended by the American Diabetes Association (ADA) may be used as an aid in the diagnosis of diabetes mellitus.  Hemoglobin             Suggested A1C NGSP%              Diagnosis  <5.7                   Non Diabetic  5.7-6.4                Pre-Diabetic  >6.4                   Diabetic  <7.0                   Glycemic control for                       adults with diabetes.     Mean Plasma Glucose 09/11/2024 108.28  mg/dL Final   Performed at [facility]  Lab, [address] [place], [place] [address]   Magnesium  09/11/2024 2.3  1.7 - 2.4 mg/dL Final   Performed at [facility], [address], [place], [place] [address]   Alcohol, Ethyl (B) 09/11/2024 <15  <15 mg/dL Final   Comment: (NOTE) For medical purposes only. Performed at [facility], [address] [place], [place] [address]    Cholesterol 09/11/2024 148  0 - 200 mg/dL Final   Triglycerides 89/91/7974 44  <150 mg/dL Final   HDL 89/91/7974 47  >40 mg/dL Final   Total CHOL/HDL Ratio 09/11/2024 3.1  RATIO Final   VLDL 09/11/2024 9  0 - 40 mg/dL Final   LDL Cholesterol 09/11/2024 92  0 - 99 mg/dL Final   Comment:        Total Cholesterol/HDL:CHD Risk Coronary Heart Disease Risk Table                     Men   Women  1/2 Average Risk   3.4   3.3  Average Risk        5.0   4.4  2 X Average Risk   9.6   7.1  3 X Average Risk  23.4   11.0        Use the calculated Patient Ratio above and the CHD Risk Table to determine the patient's CHD Risk.        ATP III CLASSIFICATION (LDL):  <100     mg/dL   Optimal  899-870  mg/dL   Near or Above                    Optimal  130-159  mg/dL   Borderline  839-810  mg/dL   High  >809     mg/dL   Very High Performed at [facility], [address] [place], [place] [address]    Color, Urine 09/11/2024 YELLOW  YELLOW Final   APPearance 09/11/2024 CLEAR  CLEAR Final   Specific Gravity, Urine 09/11/2024 1.035 (H)  1.005 - 1.030 Final   pH 09/11/2024 5.0  5.0 - 8.0 Final   Glucose, UA 09/11/2024 NEGATIVE  NEGATIVE mg/dL Final   Hgb urine dipstick 09/11/2024 NEGATIVE  NEGATIVE Final   Bilirubin Urine 09/11/2024 NEGATIVE  NEGATIVE Final   Ketones, ur 09/11/2024 20 (A)  NEGATIVE mg/dL Final   Protein, ur 89/91/7974 30 (A)  NEGATIVE mg/dL Final   Nitrite 89/91/7974 NEGATIVE  NEGATIVE Final   Leukocytes,Ua 09/11/2024 NEGATIVE  NEGATIVE Final   RBC / HPF 09/11/2024 0-5  0 - 5 RBC/hpf Final   WBC, UA 09/11/2024 0-5  0 - 5 WBC/hpf Final   Bacteria, UA 09/11/2024 NONE SEEN  NONE SEEN Final   Squamous Epithelial / HPF 09/11/2024 0-5  0 - 5 /HPF Final   Mucus 09/11/2024 PRESENT   Final   Non Squamous Epithelial 09/11/2024 0-5 (A)  NONE SEEN Final   Performed at [facility], [address] [place], [place] [address]   POC Amphetamine UR 09/11/2024 Positive (A)  NONE DETECTED (Cut Off Level 1000 ng/mL) Final   POC Secobarbital (BAR) 09/11/2024 None Detected  NONE DETECTED (Cut Off Level 300 ng/mL) Final   POC Buprenorphine (BUP) 09/11/2024 None Detected  NONE DETECTED (Cut Off Level 10 ng/mL) Final   POC Oxazepam (BZO) 09/11/2024 None Detected  NONE DETECTED (Cut Off Level 300 ng/mL) Final   POC Cocaine UR 09/11/2024 None Detected  NONE DETECTED (  Cut Off Level 300 ng/mL) Final   POC Methamphetamine UR  09/11/2024 Positive (A)  NONE DETECTED (Cut Off Level 1000 ng/mL) Final   POC Morphine 09/11/2024 None Detected  NONE DETECTED (Cut Off Level 300 ng/mL) Final   POC Methadone UR 09/11/2024 None Detected  NONE DETECTED (Cut Off Level 300 ng/mL) Final   POC Oxycodone UR 09/11/2024 None Detected  NONE DETECTED (Cut Off Level 100 ng/mL) Final   POC Marijuana UR 09/11/2024 Positive (A)  NONE DETECTED (Cut Off Level 50 ng/mL) Final   TSH 09/11/2024 1.584  0.350 - 4.500 uIU/mL Final   Comment: Performed by a 3rd Generation assay with a functional sensitivity of <=0.01 uIU/mL. Performed at [facility], [address] [place], [place] [address]   Office Visit on 06/14/2024  Component Date Value Ref Range Status   CD4 T Helper % 06/14/2024 31  30 - 61 % Final   Absolute CD4 06/14/2024 1,015  490 - 1,740 cells/uL Final   Total lymphocyte count 06/14/2024 3,299  850 - 3,900 cells/uL Final   Comment: The analytical performance characteristics of this assay  have been determined by [employer]. The  modifications have not been cleared or approved by the  FDA. This assay has been validated pursuant to the CLIA  regulations and is used for clinical purposes.    HIV 1 RNA Quant 06/14/2024 NOT DETECTED  NOT DETECTED copies/mL Final   HIV-1 RNA Quant, Log 06/14/2024 NOT DETECTED  NOT DETECTED Log copies/mL Final   Comment: . This test was performed using Real-Time Polymerase Chain Reaction. . Reportable Range: 20 copies/mL to 10,000,000 copies/mL (1.30 log copies/mL to 7.00 log copies/mL).    RPR Ser Ql 06/14/2024 NON-REACTIVE  NON-REACTIVE Final   Comment: . No laboratory evidence of syphilis. If recent exposure is suspected, submit a new sample in 2-4 weeks. .    Hep B S AB Quant (Post) 06/14/2024 661  > OR = 10 mIU/mL Final   Comment: . Patient has immunity to hepatitis B virus. . For additional information, please refer to http://education.questdiagnostics.com/faq/FAQ105 (This  link is being provided for informational/ educational purposes only).    Hepatitis B Surface Ag 06/14/2024 NON-REACTIVE  NON-REACTIVE Final   Comment: . For additional information, please refer to  http://education.questdiagnostics.com/faq/FAQ202  (This link is being provided for informational/ educational purposes only.) .    Hep B Core Total Ab 06/14/2024 NON-REACTIVE  NON-REACTIVE Final   Comment: . For additional information, please refer to  http://education.questdiagn[facility].com/faq/FAQ202  (This link is being provided for informational/ educational purposes only.) .    Hepatitis C Ab 06/14/2024 NON-REACTIVE  NON-REACTIVE Final   Comment: . HCV antibody was non-reactive. There is no laboratory  evidence of HCV infection. . In most cases, no further action is required. However, if recent HCV exposure is suspected, a test for HCV RNA (test code 64354) is suggested. . For additional information please refer to http://education.questdiagnostics.com/faq/FAQ22v1 (This link is being provided for informational/ educational purposes only.) .    Chlamydia trachomatis RNA 06/14/2024 NOT DETECTED  NOT DETECTED Final   Neisseria gonorrhoeae RNA 06/14/2024 NOT DETECTED  NOT DETECTED Final   Comment: The analytical performance characteristics of this assay have been determined by [employer]. The modifications have not been cleared or approved by the FDA. This assay has been validated pursuant to the CLIA regulations and is used for clinical purposes. .    Chlamydia Trachomatis RNA 06/14/2024 NOT DETECTED  NOT DETECTED Final   Neisseria  Gonorrhoeae RNA 06/14/2024 NOT DETECTED  NOT DETECTED Final   Comment: The analytical performance characteristics of this assay have been determined by [employer]. The modifications have not been cleared or approved by the FDA. This assay has been validated pursuant to the CLIA regulations and is used for clinical purposes. .     C. trachomatis RNA, TMA 06/14/2024 NOT DETECTED  NOT DETECTED Final   N. gonorrhoeae RNA, TMA 06/14/2024 NOT DETECTED  NOT DETECTED Final   Comment: The analytical performance characteristics of this assay, when used to test SurePath(TM) specimens have been determined by [employer]. The modifications have not been cleared or approved by the FDA. This assay has been validated pursuant to the CLIA regulations and is used for clinical purposes. . For additional information, please refer to https://education.questdiagnostics.com/faq/FAQ154 (This link is being provided for information/ educational purposes only.) .    Relevant History: 06/14/2024    Final   Comment: Encounter for screening for infections with a  predominantly sexual mode of transmission , Human  immunodeficiency virus [HIV] disease , Encounter  for screening for malignant neoplasm, site  unspecified    Cytotechnologist: 06/14/2024    Final   Comment: [facility], [place](ASCP) Screening location: [facility] [address] [place], [place] [address] CLIA: [id number]    Pathologist: 06/14/2024    Final   Comment: Ozell JONELLE Chillington, MD, Board Certified in Anatomic/Clinical Pathology and Cytopathology, [phone] (Electronic signature) Pathologist Release Date/Time: 06/18/2024 05:50PM    A Source 06/14/2024    Final   Anal canal   A Procedure 06/14/2024    Final   Cytopathology   A Gross Description 06/14/2024    Final   Comment: The specimen is received in preservcyt labeled  with the patient's name and consists of 20 ml of  clear colorless fluid. One liquid based slide  prepared and stained with the Papanicolaou stain.    A Diagnosis 06/14/2024    Final   Comment: Negative for dysplasia or malignancy  - squamous cells with transitional zone/glandular  cells   Admission on 04/10/2024, Discharged on 04/12/2024  Component Date Value Ref Range Status   WBC 04/10/2024 7.7  4.0 - 10.5 K/uL Final   RBC 04/10/2024  4.27  4.22 - 5.81 MIL/uL Final   Hemoglobin 04/10/2024 13.5  13.0 - 17.0 g/dL Final   HCT 94/92/7974 39.8  39.0 - 52.0 % Final   MCV 04/10/2024 93.2  80.0 - 100.0 fL Final   MCH 04/10/2024 31.6  26.0 - 34.0 pg Final   MCHC 04/10/2024 33.9  30.0 - 36.0 g/dL Final   RDW 94/92/7974 14.3  11.5 - 15.5 % Final   Platelets 04/10/2024 162  150 - 400 K/uL Final   nRBC 04/10/2024 0.0  0.0 - 0.2 % Final   Neutrophils Relative % 04/10/2024 61  % Final   Neutro Abs 04/10/2024 4.7  1.7 - 7.7 K/uL Final   Lymphocytes Relative 04/10/2024 30  % Final   Lymphs Abs 04/10/2024 2.3  0.7 - 4.0 K/uL Final   Monocytes Relative 04/10/2024 7  % Final   Monocytes Absolute 04/10/2024 0.5  0.1 - 1.0 K/uL Final   Eosinophils Relative 04/10/2024 2  % Final   Eosinophils Absolute 04/10/2024 0.1  0.0 - 0.5 K/uL Final   Basophils Relative 04/10/2024 0  % Final   Basophils Absolute 04/10/2024 0.0  0.0 - 0.1 K/uL Final   Immature Granulocytes 04/10/2024 0  % Final   Abs Immature Granulocytes 04/10/2024 0.01  0.00 - 0.07 K/uL Final   Performed at [facility], [address], [place], [place] [address]   Sodium 04/10/2024 143  135 - 145 mmol/L Final   Potassium 04/10/2024 3.7  3.5 - 5.1 mmol/L Final   Chloride 04/10/2024 110  98 - 111 mmol/L Final   CO2 04/10/2024 25  22 - 32 mmol/L Final   Glucose, Bld 04/10/2024 73  70 - 99 mg/dL Final   Glucose reference range applies only to samples taken after fasting for at least 8 hours.   BUN 04/10/2024 26 (H)  6 - 20 mg/dL Final   Creatinine, Ser 04/10/2024 1.16  0.61 - 1.24 mg/dL Final   Calcium 94/92/7974 9.3  8.9 - 10.3 mg/dL Final   Total Protein 94/92/7974 7.6  6.5 - 8.1 g/dL Final   Albumin 94/92/7974 4.2  3.5 - 5.0 g/dL Final   AST 94/92/7974 21  15 - 41 U/L Final   ALT 04/10/2024 14  0 - 44 U/L Final   Alkaline Phosphatase 04/10/2024 47  38 - 126 U/L Final   Total Bilirubin 04/10/2024 0.7  0.0 - 1.2 mg/dL Final   GFR, Estimated 04/10/2024 >60  >60 mL/min Final    Comment: (NOTE) Calculated using the CKD-EPI Creatinine Equation (2021)    Anion gap 04/10/2024 8  5 - 15 Final   Performed at [facility], [address], [place], [place] [address]   POC Amphetamine UR 04/10/2024 Positive (A)  NONE DETECTED (Cut Off Level 1000 ng/mL) Final   POC Secobarbital (BAR) 04/10/2024 None Detected  NONE DETECTED (Cut Off Level 300 ng/mL) Final   POC Buprenorphine (BUP) 04/10/2024 None Detected  NONE DETECTED (Cut Off Level 10 ng/mL) Final   POC Oxazepam (BZO) 04/10/2024 None Detected  NONE DETECTED (Cut Off Level 300 ng/mL) Final   POC Cocaine UR 04/10/2024 None Detected  NONE DETECTED (Cut Off Level 300 ng/mL) Final   POC Methamphetamine UR 04/10/2024 Positive (A)  NONE DETECTED (Cut Off Level 1000 ng/mL) Final   POC Morphine 04/10/2024 None Detected  NONE DETECTED (Cut Off Level 300 ng/mL) Final   POC Methadone UR 04/10/2024 None Detected  NONE DETECTED (Cut Off Level 300 ng/mL) Final   POC Oxycodone UR 04/10/2024 None Detected  NONE DETECTED (Cut Off Level 100 ng/mL) Final   POC Marijuana UR 04/10/2024 Positive (A)  NONE DETECTED (Cut Off Level 50 ng/mL) Final    Blood Alcohol level:  Lab Results  Component Value Date   [facility] <15 09/11/2024   ETH <10 02/18/2024    Metabolic Disorder Labs: Lab Results  Component Value Date   HGBA1C 5.4 09/11/2024   MPG 108.28 09/11/2024   MPG 114.02 02/18/2024   Lab Results  Component Value Date   PROLACTIN 19.4 06/13/2023   Lab Results  Component Value Date   CHOL 148 09/11/2024   TRIG 44 09/11/2024   HDL 47 09/11/2024   CHOLHDL 3.1 09/11/2024   VLDL 9 09/11/2024   LDLCALC 92 09/11/2024   LDLCALC 94 02/18/2024    Therapeutic Lab Levels: No results found for: LITHIUM No results found for: VALPROATE No results found for: CBMZ  Physical Findings   AIMS    Flowsheet Row [facility]  AIMS Total Score 0   AUDIT     Flowsheet Row Ad[facility] ([facility]  Alcohol Use Disorder Identification Test Final Score (AUDIT) 0  PHQ2-9    Flowsheet Row ED from 09/11/2024 in [facility] ED from 02/21/2024 in [facility] ED from 02/19/2024 in [facility] Office Visit from 02/04/2022 in [facility] [place] Office Visit from 04/16/2021 in [facility] Of [place]. [facility]  PHQ-2 Total Score 2 0 6 0 0  PHQ-9 Total Score 8 0 26 -- --   Flowsheet Row ED from 09/11/2024 in [facility] ED from 09/10/2024 in [facility] Emergency Department at [facility] ED from 04/10/2024 in [facility]  C-SSRS RISK CATEGORY No Risk No Risk High Risk     Musculoskeletal  Strength & Muscle Tone: within normal limits Gait & Station: normal Patient leans: N/A  Psychiatric Specialty Exam  Presentation  General Appearance:  Casual; Appropriate for Environment  Eye Contact: Fair  Speech: Clear and Coherent  Speech Volume: Decreased  Handedness: Right   Mood and Affect  Mood: Dysphoric  Affect: Congruent; Flat   Thought Process  Thought Processes: Coherent  Descriptions of Associations:Intact  Orientation:Full (Time, Place and Person)  Thought Content:WDL  Diagnosis of Schizophrenia or Schizoaffective disorder in past: Yes  Duration of Psychotic Symptoms: No data recorded  Hallucinations:Hallucinations: None  Ideas of Reference:None  Suicidal Thoughts:Suicidal Thoughts: No  Homicidal Thoughts:Homicidal Thoughts: No   Sensorium  Memory: Recent Fair; Immediate Good; Immediate Fair  Judgment: Fair  Insight: Fair   Art therapist  Concentration: Fair  Attention  Span: Fair  Recall: [employer] of Knowledge: Fair  Language: Fair   Psychomotor Activity  Psychomotor Activity: Psychomotor Activity: Normal   Assets  Assets: Communication Skills; Desire for Improvement; Financial Resources/Insurance; Physical Health; Resilience; Social Support   Sleep  Sleep: Sleep: Good  No Safety Checks orders active in given range  Nutritional Assessment (For OBS and [facility] admissions only) Has the patient had a weight loss or gain of 10 pounds or more in the last 3 months?: No Has the patient had a decrease in food intake/or appetite?: No Does the patient have dental problems?: No Does the patient have eating habits or behaviors that may be indicators of an eating disorder including binging or inducing vomiting?: No Has the patient recently lost weight without trying?: 0 Has the patient been eating poorly because of a decreased appetite?: 0 Malnutrition Screening Tool Score: 0    Physical Exam  Physical Exam Vitals and nursing note reviewed.  Constitutional:      Appearance: Normal appearance.  HENT:     Head: Normocephalic.     Nose: Nose normal.  Eyes:     Extraocular Movements: Extraocular movements intact.  Cardiovascular:     Rate and Rhythm: Normal rate.  Pulmonary:     Effort: Pulmonary effort is normal.  Musculoskeletal:        General: Normal range of motion.     Cervical back: Normal range of motion.  Skin:    General: Skin is warm.     Comments: Edema to LT forearm   Neurological:     General: No focal deficit present.     Mental Status: He is alert and oriented to person, place, and time.    Review of Systems  Constitutional: Negative.   HENT: Negative.    Eyes: Negative.   Respiratory: Negative.    Cardiovascular: Negative.  Gastrointestinal: Negative.   Genitourinary: Negative.   Musculoskeletal: Negative.   Skin:        Redness and swelling to left forearm.   Neurological: Negative.   Endo/Heme/Allergies:  Negative.   Psychiatric/Behavioral:  Positive for depression and substance abuse.    Blood pressure 104/62, pulse 77, temperature 98.5 F (36.9 C), temperature source Oral, resp. rate 18, SpO2 96%. There is no height or weight on file to calculate BMI.  Treatment Plan Summary: Daily contact with patient to assess and evaluate symptoms and progress in treatment and Medication management Pt will remain in continuous assessment for psychotropic medication management and monitoring.Plan to discharge tomorrow morning with counselor to take him to [facility] [place] in [place]  for residential substance abuse treatment. Pt will need a 30 day script for medications at discharge.   Alan JAYSON Mcardle, NP 09/12/2024 11:51 AM

## 2024-09-12 NOTE — ED Notes (Signed)
Pt had dinner 

## 2024-09-12 NOTE — ED Notes (Signed)
 Pt sleeping at present, no distress noted, monitoring for safety.

## 2024-09-13 MED ORDER — BUPROPION HCL ER (XL) 150 MG PO TB24: 150.0000 mg | ORAL_TABLET | Freq: Every day | ORAL | 0 refills | Status: AC

## 2024-09-13 MED ORDER — HYDROXYZINE HCL 25 MG PO TABS: 25.0000 mg | ORAL_TABLET | Freq: Three times a day (TID) | ORAL | 0 refills | Status: AC | PRN

## 2024-09-13 MED ORDER — ARIPIPRAZOLE 10 MG PO TABS: 10.0000 mg | ORAL_TABLET | Freq: Every day | ORAL | 0 refills | Status: AC

## 2024-09-13 MED ORDER — DOXYCYCLINE HYCLATE 100 MG PO TABS: 100.0000 mg | ORAL_TABLET | Freq: Two times a day (BID) | ORAL | 0 refills | Status: AC

## 2024-09-13 NOTE — ED Notes (Signed)
Pt was provided breakfast at bedside.

## 2024-09-13 NOTE — ED Notes (Signed)
 Pt asleep at this hour. No apparent distress. RR even and unlabored. Monitored for safety.

## 2024-09-13 NOTE — ED Notes (Addendum)
 Patient A&O x 4, ambulatory. Patient discharged in no acute distress. Patient denied SI/HI, A/VH upon discharge. Patient verbalized understanding of all discharge instructions reviewed on AVS via staff, to include follow up recommendations, RX's and safety. Pt belongings returned to patient from locker intact. Patient escorted to lobby via staff for transport to destination. Safety maintained.

## 2024-09-13 NOTE — ED Notes (Signed)
 Patient A&Ox4. Denies intent to harm self/others when asked. Denies A/VH. Patient c/o itching to L FA edematous site. Dressing to site noted to have red drainage, non-saturation. Pt observed picking at site and encouraged to not touch area to prevent further infection to site. Pt received ABT without difficulty. Pleasant and cooperative with staff. Support and encouragement provided. Routine safety checks conducted according to facility protocol. Encouraged patient to notify staff if thoughts of harm toward self or others arise. Patient verbalize understanding and agreement. Will continue to monitor for safety.

## 2024-09-13 NOTE — ED Notes (Signed)
 Pt sleeping in no acute distress. RR even and unlabored. Environment secured. Will continue to monitor for safety.

## 2024-09-13 NOTE — ED Provider Notes (Signed)
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 Knowledge: Fair  Language: Fair   Psychomotor Activity  Psychomotor Activity: Psychomotor Activity: Normal   Assets  Assets: Communication Skills; Desire for Improvement;  Financial Resources/Insurance; Physical Health; Resilience; Social Support   Sleep  Sleep: Sleep: Good  No Safety Checks orders  active in given range  No data recorded   Physical Exam  Physical Exam Vitals and nursing note reviewed.  Constitutional:      Appearance: Normal appearance.  HENT:     Head: Normocephalic.     Nose: Nose normal.  Eyes:     Extraocular Movements: Extraocular movements intact.  Cardiovascular:     Rate and Rhythm: Normal rate.  Pulmonary:     Effort: Pulmonary effort is normal.  Musculoskeletal:        General: Normal range of motion.     Cervical back: Normal range of motion.  Skin:    General: Skin is warm.     Comments: Edema to LT forearm   Neurological:     General: No focal deficit present.     Mental Status: He is alert and oriented to person, place, and time.    Review of Systems  Constitutional: Negative.   HENT: Negative.    Eyes: Negative.   Respiratory: Negative.    Cardiovascular: Negative.   Gastrointestinal: Negative.   Genitourinary: Negative.   Musculoskeletal: Negative.   Skin:        Redness and swelling to left forearm.   Neurological: Negative.   Endo/Heme/Allergies: Negative.   Psychiatric/Behavioral:  Positive for depression and substance abuse.    Blood pressure 101/68, pulse 98, temperature 97.9 F (36.6 C), temperature source Oral, resp. rate 18, SpO2 100%. There is no height or weight on file to calculate BMI.  Demographic Factors:  Male, Low socioeconomic status, and Unemployed   Loss Factors: Financial problems/change in socioeconomic status   Historical Factors: Family history of mental illness or substance abuse and Impulsivity   Risk Reduction Factors:   Sense of responsibility to family, Religious beliefs about death, and Positive therapeutic relationship   Continued Clinical Symptoms:  Depression:   Anhedonia   Cognitive Features That Contribute To Risk:  None     Suicide Risk:  Mild:  Suicidal ideation of limited frequency, intensity, duration, and specificity.  There are no identifiable plans, no associated intent,  mild dysphoria and related symptoms, good self-control (both objective and subjective assessment), few other risk factors, and identifiable protective factors, including available and accessible social support.   Plan Of Care/Follow-up recommendations:  Pt discharged with a list of outpatient substance abuse treatment programs.   He is also encouraged to follow-up with [facility] to continue with medication management and eventually restart Abilify  long-acting injection.  -30 day paper script provided per facility.    Disposition: Discharged with transportation to [facility] in [place] .    While future psychiatric events cannot be accurately predicted, the patient does not currently require acute inpatient psychiatric care and does not currently meet [place]  involuntary commitment criteria. Patient was able to engage in safety planning including plan to return to [facility] or contact emergency services if he feels unable to maintain his own safety or the safety of others.   Alan JAYSON Mcardle, NP 09/13/2024 11:17 AM

## 2024-09-13 NOTE — Discharge Instructions (Addendum)
 Discharge recommendations:   Medications: Conitnue taking Wellbutrin  150mg  daily and Abilify  10mg  daily. You may take Hydroxyzine  25mg  as needed for anxiety up to 3 times a day. Patient is to take medications as prescribed. The patient or patient's guardian is to contact a medical professional and/or outpatient provider to address any new side effects that develop. The patient or the patient's guardian should update outpatient providers of any new medications and/or medication changes.    Outpatient Follow up: Please review list of outpatient resources for psychiatry and counseling. Please follow up with your primary care provider for all medical related needs.    Therapy: We recommend that patient participate in individual therapy to address mental health concerns.   Atypical antipsychotics: If you are prescribed an atypical antipsychotic, it is recommended that your height, weight, BMI, blood pressure, fasting lipid panel, and fasting blood sugar be monitored by your outpatient providers.  Safety:   The following safety precautions should be taken:   No sharp objects. This includes scissors, razors, scrapers, and putty knives.   Chemicals should be removed and locked up.   Medications should be removed and locked up.   Weapons should be removed and locked up. This includes firearms, knives and instruments that can be used to cause injury.   The patient should abstain from use of illicit substances/drugs and abuse of any medications.  If symptoms worsen or do not continue to improve or if the patient becomes actively suicidal or homicidal then it is recommended that the patient return to the closest hospital emergency department, the [facility], or call 911 for further evaluation and treatment. National Suicide Prevention Lifeline 1-800-SUICIDE or 406 197 9606.  About 988 988 offers 24/7 access to trained crisis counselors who can help people  experiencing mental health-related distress. People can call or text 988 or chat 988lifeline.org for themselves or if they are worried about a loved one who may need crisis support.    Substance Abuse Resources     SUBSTANCE USE TREATMENT for Medicaid and State Funded/IPRS  [facility] [address], [place], [address] [phone] phone NOTE: [facility] is no longer offering IOP services.  Serves those who are low-income or have no insurance.  [facility] [address] [place], [place], [address] [phone] phone [phone] fax NOTE: Does have Substance Abuse-Intensive Outpatient Program [facility]) as well as transitional housing if eligible.  [facility] [address] [place], [place], [address] [phone] phone [phone] fax  [facility] [id number] [place], [place], [address] [phone] phone [phone] fax    HALFWAY HOUSES:  [facility] [phone]  [employer].oxfordvacancies.[email or web address]     12 STEP PROGRAMS:  Alcoholics Anonymous of [place] SoftwareChalet.be  Narcotics Anonymous of [place] [employer]ct.dk  Al-Anon of [place], [place] www.[place]alanon.org/find-meetings.html  Nar-Anon https://nar-anon.org/find-a-meetin      List of Residential placements:   [facility] in [place]: [phone]  [facility]: [phone]  [place], [place] [phone]: Male and male facility; 30-day program: (uninsured and Medicaid such as [place], [place], [place], partners)  [facility]: [phone]; men and women's facility; 28 days; Can have Medicaid tailored plan [employer] or Partners)  [facility]: [phone] [place] or [place]; 28 day program; must be fully detox; tailored Medicaid or no insurance  [address] in [place], [place]; [phone]; 28 day all  males program; no insurance accepted  [facility] in [place]: [place] [phone] (no insurance  or Medicaid only); 90 days; outpatient services but provide housing in apartments [place]  [facility] Admission: [phone]: Patient must complete phone screening for placement: [place], [place]; 6 month program; uninsured, Medicaid, and [employer].   [facility]: no insurance required; [phone]  [facility]: [phone]; Intake: [place]; Must fill out application online; Steffan Delay [phone] x [address] [facility] in [place], [place]: [phone]; Admissions Coordinators Mr. Marinda or Alm Eagles; 90 day program.  [facility]: [place], [place] [phone]; Co-Ed 9 month to a year program; Online application; Men entry fee is $500 (6-29months);  [employer]: [address] [place], [place] [address]; no fee or insurance required; minimum of 2 years; Highly structured; work based; Intake Coordinator is [place] [phone]  [facility] in [place], [place]: [phone]; Fax number is [phone]; website: www.[facility].org; Requires 3-6 page autobiography; 2 year program (18 months and then 87month transitional housing); Admission fee is $300; no insurance needed; work Automotive engineer in [place], [place]: [employer] Desk Staff: [place] [phone]: They have a Men's Regenerations Program 6-98months. Free program; There is an initial $300 fee however, they are willing to work with patients regarding that. Application is online.  [facility]: Admissions [phone] [place] [phone]; Any 7-90 day program is out of pocket; 12 month program is free of charge; there is a $275 entry fee; Patient is responsible for own transportation

## 2024-10-02 ENCOUNTER — Ambulatory Visit: Admitting: Family
